# Patient Record
Sex: Female | Born: 1950 | Race: White | Hispanic: No | Marital: Married | Smoking: Former smoker
Health system: Southern US, Community
[De-identification: ages and names within clinical notes are randomized; demographics above are authoritative.]

## PROBLEM LIST (undated history)

## (undated) DIAGNOSIS — I480 Paroxysmal atrial fibrillation: Secondary | ICD-10-CM

## (undated) DIAGNOSIS — J9611 Chronic respiratory failure with hypoxia: Secondary | ICD-10-CM

## (undated) DIAGNOSIS — I509 Heart failure, unspecified: Secondary | ICD-10-CM

## (undated) DIAGNOSIS — I5030 Unspecified diastolic (congestive) heart failure: Secondary | ICD-10-CM

## (undated) DIAGNOSIS — I272 Pulmonary hypertension, unspecified: Secondary | ICD-10-CM

## (undated) DIAGNOSIS — J449 Chronic obstructive pulmonary disease, unspecified: Secondary | ICD-10-CM

## (undated) DIAGNOSIS — I10 Essential (primary) hypertension: Secondary | ICD-10-CM

## (undated) DIAGNOSIS — G4733 Obstructive sleep apnea (adult) (pediatric): Secondary | ICD-10-CM

## (undated) DIAGNOSIS — I2510 Atherosclerotic heart disease of native coronary artery without angina pectoris: Secondary | ICD-10-CM

## (undated) HISTORY — DX: Obstructive sleep apnea (adult) (pediatric): G47.33

## (undated) HISTORY — DX: Atherosclerotic heart disease of native coronary artery without angina pectoris: I25.10

## (undated) HISTORY — DX: Pulmonary hypertension, unspecified: I27.20

## (undated) HISTORY — PX: NO PAST SURGERIES: SHX2092

## (undated) HISTORY — DX: Unspecified diastolic (congestive) heart failure: I50.30

## (undated) HISTORY — DX: Chronic respiratory failure with hypoxia: J96.11

## (undated) HISTORY — DX: Paroxysmal atrial fibrillation: I48.0

---
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
## 2019-11-08 ENCOUNTER — Inpatient Hospital Stay
Admission: EM | Admit: 2019-11-08 | Discharge: 2019-11-10 | DRG: 291 | Disposition: A | Discharge status: Home Health | Payer: Medicare Other | Attending: Internal Medicine | Admitting: Internal Medicine

## 2019-11-08 ENCOUNTER — Emergency Department: Payer: Medicare Other

## 2019-11-08 ENCOUNTER — Encounter: Payer: Self-pay | Admitting: Emergency Medicine

## 2019-11-08 ENCOUNTER — Other Ambulatory Visit: Payer: Self-pay

## 2019-11-08 ENCOUNTER — Inpatient Hospital Stay: Payer: Medicare Other

## 2019-11-08 DIAGNOSIS — I471 Supraventricular tachycardia: Secondary | ICD-10-CM | POA: Diagnosis not present

## 2019-11-08 DIAGNOSIS — Z9981 Dependence on supplemental oxygen: Secondary | ICD-10-CM | POA: Diagnosis not present

## 2019-11-08 DIAGNOSIS — I272 Pulmonary hypertension, unspecified: Secondary | ICD-10-CM | POA: Diagnosis present

## 2019-11-08 DIAGNOSIS — R778 Other specified abnormalities of plasma proteins: Secondary | ICD-10-CM | POA: Diagnosis present

## 2019-11-08 DIAGNOSIS — I110 Hypertensive heart disease with heart failure: Secondary | ICD-10-CM | POA: Diagnosis not present

## 2019-11-08 DIAGNOSIS — E662 Morbid (severe) obesity with alveolar hypoventilation: Secondary | ICD-10-CM | POA: Diagnosis present

## 2019-11-08 DIAGNOSIS — I5031 Acute diastolic (congestive) heart failure: Secondary | ICD-10-CM | POA: Diagnosis not present

## 2019-11-08 DIAGNOSIS — Z20822 Contact with and (suspected) exposure to covid-19: Secondary | ICD-10-CM | POA: Diagnosis present

## 2019-11-08 DIAGNOSIS — J9621 Acute and chronic respiratory failure with hypoxia: Secondary | ICD-10-CM | POA: Diagnosis present

## 2019-11-08 DIAGNOSIS — I5033 Acute on chronic diastolic (congestive) heart failure: Secondary | ICD-10-CM | POA: Diagnosis present

## 2019-11-08 DIAGNOSIS — Z882 Allergy status to sulfonamides status: Secondary | ICD-10-CM

## 2019-11-08 DIAGNOSIS — J9622 Acute and chronic respiratory failure with hypercapnia: Secondary | ICD-10-CM | POA: Diagnosis present

## 2019-11-08 DIAGNOSIS — I5032 Chronic diastolic (congestive) heart failure: Secondary | ICD-10-CM | POA: Diagnosis present

## 2019-11-08 DIAGNOSIS — Z87891 Personal history of nicotine dependence: Secondary | ICD-10-CM | POA: Diagnosis not present

## 2019-11-08 DIAGNOSIS — J441 Chronic obstructive pulmonary disease with (acute) exacerbation: Secondary | ICD-10-CM | POA: Diagnosis present

## 2019-11-08 DIAGNOSIS — E785 Hyperlipidemia, unspecified: Secondary | ICD-10-CM

## 2019-11-08 DIAGNOSIS — R140 Abdominal distension (gaseous): Secondary | ICD-10-CM

## 2019-11-08 DIAGNOSIS — J9601 Acute respiratory failure with hypoxia: Secondary | ICD-10-CM | POA: Diagnosis present

## 2019-11-08 DIAGNOSIS — I248 Other forms of acute ischemic heart disease: Secondary | ICD-10-CM | POA: Diagnosis present

## 2019-11-08 DIAGNOSIS — Z885 Allergy status to narcotic agent status: Secondary | ICD-10-CM

## 2019-11-08 DIAGNOSIS — F419 Anxiety disorder, unspecified: Secondary | ICD-10-CM | POA: Diagnosis present

## 2019-11-08 DIAGNOSIS — Z6841 Body Mass Index (BMI) 40.0 and over, adult: Secondary | ICD-10-CM

## 2019-11-08 DIAGNOSIS — J9602 Acute respiratory failure with hypercapnia: Secondary | ICD-10-CM

## 2019-11-08 DIAGNOSIS — G2581 Restless legs syndrome: Secondary | ICD-10-CM | POA: Diagnosis present

## 2019-11-08 DIAGNOSIS — R008 Other abnormalities of heart beat: Secondary | ICD-10-CM | POA: Diagnosis not present

## 2019-11-08 HISTORY — DX: Essential (primary) hypertension: I10

## 2019-11-08 HISTORY — DX: Chronic obstructive pulmonary disease, unspecified: J44.9

## 2019-11-08 LAB — COMPREHENSIVE METABOLIC PANEL
ALT: 21 U/L (ref 0–44)
AST: 16 U/L (ref 15–41)
Albumin: 3.8 g/dL (ref 3.5–5.0)
Alkaline Phosphatase: 99 U/L (ref 38–126)
Anion gap: 9 (ref 5–15)
BUN: 15 mg/dL (ref 8–23)
CO2: 29 mmol/L (ref 22–32)
Calcium: 9.1 mg/dL (ref 8.9–10.3)
Chloride: 100 mmol/L (ref 98–111)
Creatinine, Ser: 0.72 mg/dL (ref 0.44–1.00)
GFR calc Af Amer: >60 mL/min (ref >60)
GFR calc non Af Amer: >60 mL/min (ref >60)
Glucose, Bld: 103 mg/dL — ABNORMAL HIGH (ref 70–99)
Potassium: 4.4 mmol/L (ref 3.5–5.1)
Sodium: 138 mmol/L (ref 135–145)
Total Bilirubin: 0.9 mg/dL (ref 0.3–1.2)
Total Protein: 7.6 g/dL (ref 6.5–8.1)

## 2019-11-08 LAB — CBC WITH DIFFERENTIAL/PLATELET
Abs Immature Granulocytes: 0.21 10*3/uL — ABNORMAL HIGH (ref 0.00–0.07)
Abs Immature Granulocytes: 0.12 10*3/uL — ABNORMAL HIGH (ref 0.00–0.07)
Basophils Absolute: 0.1 10*3/uL (ref 0.0–0.1)
Basophils Absolute: 0 10*3/uL (ref 0.0–0.1)
Basophils Relative: 1 %
Basophils Relative: 0 %
Eosinophils Absolute: 0.8 10*3/uL — ABNORMAL HIGH (ref 0.0–0.5)
Eosinophils Absolute: 0 10*3/uL (ref 0.0–0.5)
Eosinophils Relative: 4 %
Eosinophils Relative: 0 %
HCT: 41.3 % (ref 36.0–46.0)
HCT: 38.9 % (ref 36.0–46.0)
Hemoglobin: 12.7 g/dL (ref 12.0–15.0)
Hemoglobin: 11.6 g/dL — ABNORMAL LOW (ref 12.0–15.0)
Immature Granulocytes: 1 %
Immature Granulocytes: 1 %
Lymphocytes Relative: 6 %
Lymphocytes Relative: 2 %
Lymphs Abs: 1.2 10*3/uL (ref 0.7–4.0)
Lymphs Abs: 0.3 10*3/uL — ABNORMAL LOW (ref 0.7–4.0)
MCH: 28.3 pg (ref 26.0–34.0)
MCH: 26.9 pg (ref 26.0–34.0)
MCHC: 30.8 g/dL (ref 30.0–36.0)
MCHC: 29.8 g/dL — ABNORMAL LOW (ref 30.0–36.0)
MCV: 92.2 fL (ref 80.0–100.0)
MCV: 90.3 fL (ref 80.0–100.0)
Monocytes Absolute: 0.9 10*3/uL (ref 0.1–1.0)
Monocytes Absolute: 0 10*3/uL — ABNORMAL LOW (ref 0.1–1.0)
Monocytes Relative: 4 %
Monocytes Relative: 0 %
Neutro Abs: 17.3 10*3/uL — ABNORMAL HIGH (ref 1.7–7.7)
Neutro Abs: 18.1 10*3/uL — ABNORMAL HIGH (ref 1.7–7.7)
Neutrophils Relative %: 84 %
Neutrophils Relative %: 97 %
Platelets: 311 10*3/uL (ref 150–400)
Platelets: 309 10*3/uL (ref 150–400)
RBC: 4.48 MIL/uL (ref 3.87–5.11)
RBC: 4.31 MIL/uL (ref 3.87–5.11)
RDW: 16.3 % — ABNORMAL HIGH (ref 11.5–15.5)
RDW: 16.1 % — ABNORMAL HIGH (ref 11.5–15.5)
WBC: 20.5 10*3/uL — ABNORMAL HIGH (ref 4.0–10.5)
WBC: 18.6 10*3/uL — ABNORMAL HIGH (ref 4.0–10.5)
nRBC: 0 % (ref 0.0–0.2)
nRBC: 0 % (ref 0.0–0.2)

## 2019-11-08 LAB — BLOOD GAS, VENOUS
Acid-Base Excess: 5.2 mmol/L — ABNORMAL HIGH (ref 0.0–2.0)
Acid-Base Excess: 4.4 mmol/L — ABNORMAL HIGH (ref 0.0–2.0)
Bicarbonate: 35 mmol/L — ABNORMAL HIGH (ref 20.0–28.0)
Bicarbonate: 33 mmol/L — ABNORMAL HIGH (ref 20.0–28.0)
Delivery systems: POSITIVE
Delivery systems: POSITIVE
FIO2: 35
FIO2: 35
O2 Saturation: 53 %
O2 Saturation: 81.5 %
Patient temperature: 37
Patient temperature: 37
pCO2, Ven: 78 mmHg (ref 44.0–60.0)
pCO2, Ven: 67 mmHg — ABNORMAL HIGH (ref 44.0–60.0)
pH, Ven: 7.26 (ref 7.250–7.430)
pH, Ven: 7.3 (ref 7.250–7.430)
pO2, Ven: 33 mmHg (ref 32.0–45.0)
pO2, Ven: 51 mmHg — ABNORMAL HIGH (ref 32.0–45.0)

## 2019-11-08 LAB — RESPIRATORY PANEL BY RT PCR (FLU A&B, COVID)
Influenza A by PCR: NEGATIVE
Influenza B by PCR: NEGATIVE
SARS Coronavirus 2 by RT PCR: NEGATIVE

## 2019-11-08 LAB — DIGOXIN LEVEL: Digoxin Level: 0.4 ng/mL — ABNORMAL LOW (ref 0.8–2.0)

## 2019-11-08 LAB — TROPONIN I (HIGH SENSITIVITY)
Troponin I (High Sensitivity): 76 ng/L — ABNORMAL HIGH (ref <18)
Troponin I (High Sensitivity): 338 ng/L (ref <18)
Troponin I (High Sensitivity): 555 ng/L (ref <18)

## 2019-11-08 LAB — MAGNESIUM: Magnesium: 2.3 mg/dL (ref 1.7–2.4)

## 2019-11-08 LAB — POC SARS CORONAVIRUS 2 AG: SARS Coronavirus 2 Ag: NEGATIVE

## 2019-11-08 LAB — MRSA PCR SCREENING: MRSA by PCR: NEGATIVE

## 2019-11-08 LAB — GLUCOSE, CAPILLARY: Glucose-Capillary: 176 mg/dL — ABNORMAL HIGH (ref 70–99)

## 2019-11-08 LAB — BRAIN NATRIURETIC PEPTIDE: B Natriuretic Peptide: 85 pg/mL (ref 0.0–100.0)

## 2019-11-08 LAB — PROTIME-INR
INR: 1 (ref 0.8–1.2)
Prothrombin Time: 12.9 seconds (ref 11.4–15.2)

## 2019-11-08 LAB — LACTIC ACID, PLASMA: Lactic Acid, Venous: 0.9 mmol/L (ref 0.5–1.9)

## 2019-11-08 MED ORDER — PREDNISONE 20 MG PO TABS
40.0000 mg | ORAL_TABLET | Freq: Every day | ORAL | Status: DC
  Administered 2019-11-10: 40 mg via ORAL
  Filled 2019-11-08: qty 2

## 2019-11-08 MED ORDER — MOMETASONE FURO-FORMOTEROL FUM 200-5 MCG/ACT IN AERO
1.0000 | INHALATION_SPRAY | Freq: Two times a day (BID) | RESPIRATORY_TRACT | Status: DC
  Administered 2019-11-09 – 2019-11-10 (×3): 1 via RESPIRATORY_TRACT
  Filled 2019-11-08: qty 8.8

## 2019-11-08 MED ORDER — SODIUM CHLORIDE 0.9 % IV SOLN
500.0000 mg | INTRAVENOUS | Status: AC
  Administered 2019-11-08: 500 mg via INTRAVENOUS
  Filled 2019-11-08: qty 500

## 2019-11-08 MED ORDER — SODIUM CHLORIDE 0.9 % IV SOLN: 250.0000 mL | INTRAVENOUS | Status: DC | PRN

## 2019-11-08 MED ORDER — CHLORHEXIDINE GLUCONATE 0.12 % MT SOLN
15.0000 mL | Freq: Two times a day (BID) | OROMUCOSAL | Status: DC
  Administered 2019-11-09 – 2019-11-10 (×2): 15 mL via OROMUCOSAL
  Filled 2019-11-08 (×3): qty 15

## 2019-11-08 MED ORDER — ONDANSETRON HCL 4 MG PO TABS: 4.0000 mg | ORAL_TABLET | Freq: Four times a day (QID) | ORAL | Status: DC | PRN

## 2019-11-08 MED ORDER — BUDESONIDE 0.5 MG/2ML IN SUSP
2.0000 mg | Freq: Four times a day (QID) | RESPIRATORY_TRACT | Status: DC
  Administered 2019-11-08 – 2019-11-09 (×3): 2 mg via RESPIRATORY_TRACT
  Filled 2019-11-08 (×3): qty 8

## 2019-11-08 MED ORDER — BISOPROLOL FUMARATE 5 MG PO TABS
5.0000 mg | ORAL_TABLET | Freq: Every day | ORAL | Status: DC
  Administered 2019-11-09 – 2019-11-10 (×2): 5 mg via ORAL
  Filled 2019-11-08 (×3): qty 1

## 2019-11-08 MED ORDER — BISACODYL 5 MG PO TBEC: 5.0000 mg | DELAYED_RELEASE_TABLET | Freq: Every day | ORAL | Status: DC | PRN

## 2019-11-08 MED ORDER — SPIRONOLACTONE 25 MG PO TABS
25.0000 mg | ORAL_TABLET | Freq: Every day | ORAL | Status: DC
  Administered 2019-11-09 – 2019-11-10 (×2): 25 mg via ORAL
  Filled 2019-11-08 (×2): qty 1

## 2019-11-08 MED ORDER — SODIUM CHLORIDE 0.9% FLUSH: 3.0000 mL | INTRAVENOUS | Status: DC | PRN

## 2019-11-08 MED ORDER — HYDROCODONE-ACETAMINOPHEN 5-325 MG PO TABS: 1.0000 | ORAL_TABLET | ORAL | Status: DC | PRN

## 2019-11-08 MED ORDER — IOHEXOL 350 MG/ML SOLN
75.0000 mL | Freq: Once | INTRAVENOUS | Status: AC | PRN
  Administered 2019-11-08: 100 mL via INTRAVENOUS

## 2019-11-08 MED ORDER — SODIUM CHLORIDE 0.9% FLUSH
3.0000 mL | Freq: Two times a day (BID) | INTRAVENOUS | Status: DC
  Administered 2019-11-08 – 2019-11-10 (×4): 3 mL via INTRAVENOUS

## 2019-11-08 MED ORDER — ROPINIROLE HCL 1 MG PO TABS
2.0000 mg | ORAL_TABLET | Freq: Every day | ORAL | Status: DC
  Administered 2019-11-09: 2 mg via ORAL
  Filled 2019-11-08: qty 2

## 2019-11-08 MED ORDER — POLYETHYLENE GLYCOL 3350 17 G PO PACK
17.0000 g | PACK | Freq: Two times a day (BID) | ORAL | Status: DC
  Filled 2019-11-08 (×2): qty 1

## 2019-11-08 MED ORDER — ORAL CARE MOUTH RINSE
15.0000 mL | Freq: Two times a day (BID) | OROMUCOSAL | Status: DC
  Administered 2019-11-09: 15 mL via OROMUCOSAL

## 2019-11-08 MED ORDER — ALBUTEROL SULFATE (2.5 MG/3ML) 0.083% IN NEBU: 2.5000 mg | INHALATION_SOLUTION | RESPIRATORY_TRACT | Status: DC | PRN

## 2019-11-08 MED ORDER — ACETAMINOPHEN 650 MG RE SUPP: 650.0000 mg | Freq: Four times a day (QID) | RECTAL | Status: DC | PRN

## 2019-11-08 MED ORDER — SODIUM CHLORIDE 0.9% FLUSH
3.0000 mL | Freq: Two times a day (BID) | INTRAVENOUS | Status: DC
  Administered 2019-11-08 – 2019-11-09 (×3): 3 mL via INTRAVENOUS

## 2019-11-08 MED ORDER — FUROSEMIDE 10 MG/ML IJ SOLN
40.0000 mg | Freq: Two times a day (BID) | INTRAMUSCULAR | Status: DC
  Administered 2019-11-09 – 2019-11-10 (×3): 40 mg via INTRAVENOUS
  Filled 2019-11-08 (×3): qty 4

## 2019-11-08 MED ORDER — ONDANSETRON HCL 4 MG/2ML IJ SOLN: 4.0000 mg | Freq: Four times a day (QID) | INTRAMUSCULAR | Status: DC | PRN

## 2019-11-08 MED ORDER — BUDESONIDE 0.5 MG/2ML IN SUSP: 2.0000 mg | Freq: Four times a day (QID) | RESPIRATORY_TRACT | Status: DC

## 2019-11-08 MED ORDER — AZITHROMYCIN 500 MG PO TABS
500.0000 mg | ORAL_TABLET | Freq: Every day | ORAL | Status: DC
  Administered 2019-11-09 – 2019-11-10 (×2): 500 mg via ORAL
  Filled 2019-11-08 (×3): qty 1

## 2019-11-08 MED ORDER — ACETAMINOPHEN 325 MG PO TABS: 650.0000 mg | ORAL_TABLET | Freq: Four times a day (QID) | ORAL | Status: DC | PRN

## 2019-11-08 MED ORDER — ESCITALOPRAM OXALATE 10 MG PO TABS
20.0000 mg | ORAL_TABLET | Freq: Every day | ORAL | Status: DC
  Administered 2019-11-09 – 2019-11-10 (×2): 20 mg via ORAL
  Filled 2019-11-08 (×3): qty 2

## 2019-11-08 MED ORDER — DIGOXIN 125 MCG PO TABS
0.1250 mg | ORAL_TABLET | Freq: Every day | ORAL | Status: DC
  Administered 2019-11-09: 0.125 mg via ORAL
  Filled 2019-11-08 (×2): qty 1

## 2019-11-08 MED ORDER — METHYLPREDNISOLONE SODIUM SUCC 40 MG IJ SOLR
40.0000 mg | Freq: Two times a day (BID) | INTRAMUSCULAR | Status: AC
  Administered 2019-11-09 (×2): 40 mg via INTRAVENOUS
  Filled 2019-11-08 (×2): qty 1

## 2019-11-08 MED ORDER — ASPIRIN 81 MG PO CHEW
324.0000 mg | CHEWABLE_TABLET | Freq: Once | ORAL | Status: AC
  Administered 2019-11-08: 324 mg via ORAL
  Filled 2019-11-08: qty 4

## 2019-11-08 MED ORDER — IPRATROPIUM-ALBUTEROL 0.5-2.5 (3) MG/3ML IN SOLN
3.0000 mL | Freq: Four times a day (QID) | RESPIRATORY_TRACT | Status: DC
  Administered 2019-11-08 – 2019-11-10 (×8): 3 mL via RESPIRATORY_TRACT
  Filled 2019-11-08 (×8): qty 3

## 2019-11-08 MED ORDER — ENOXAPARIN SODIUM 40 MG/0.4ML ~~LOC~~ SOLN
40.0000 mg | Freq: Two times a day (BID) | SUBCUTANEOUS | Status: DC
  Administered 2019-11-09 – 2019-11-10 (×3): 40 mg via SUBCUTANEOUS
  Filled 2019-11-08 (×4): qty 0.4

## 2019-11-08 MED ORDER — CHLORHEXIDINE GLUCONATE CLOTH 2 % EX PADS
6.0000 | MEDICATED_PAD | Freq: Every day | CUTANEOUS | Status: DC
  Administered 2019-11-09 – 2019-11-10 (×2): 6 via TOPICAL

## 2019-11-08 NOTE — ED Provider Notes (Signed)
[facility] Emergency Department Provider Note  ____________________________________________   None    (approximate)  I have reviewed the triage vital signs and the nursing notes.   HISTORY  Chief Complaint Respiratory Distress   HPI Paula Torres is a 69 y.o. female with a history of COPD and CHF who presents to the emergency department for treatment and evaluation of shortness of breath.   Patient reports that she has been feeling more short of breath since this morning.  She is on 3 L of oxygen at night and as needed during the day.  Upon EMS arrival, her oxygen saturation was 87% and she was unable to speak in complete sentences.  End-tidal CO2 was 60 initially.  She has had no chest pain since onset.  EMS gave Solu-Medrol 125 mg, 5 mg of albuterol, 1 mg of Atrovent and placed her on BiPAP.  She has improved and now saturating 97 to 98%.  Past Medical History:  Diagnosis Date  . CHF (congestive heart failure) ([place])   . COPD (chronic obstructive pulmonary disease) ([facility])     There are no problems to display for this patient.   Prior to Admission medications   Not on File    Allergies Codeine and Sulfa antibiotics  No family history on file.  Social History Social History   Tobacco Use  . Smoking status: Not on file  Substance Use Topics  . Alcohol use: Not on file  . Drug use: Not on file    Review of Systems  Constitutional: No fever/chills. Eyes: No visual changes. ENT: No sore throat. Cardiovascular: Negative for chest pain.  Negative for pleuritic pain.  Negative for palpitations.  Negative for leg pain. Respiratory: Positive shortness of breath. Gastrointestinal: No abdominal pain.  No nausea, no vomiting.  No diarrhea.  No constipation. Genitourinary: Negative for dysuria. Musculoskeletal: Negative for back pain.  Skin: Negative for rash, lesion, wound. Neurological: Negative for headaches, focal weakness or  numbness. ____________________________________________   PHYSICAL EXAM:  VITAL SIGNS: ED Triage Vitals  Enc Vitals Group     BP 11/08/19 1630 137/66     Pulse Rate 11/08/19 1630 95     Resp 11/08/19 1623 (!) 34     Temp 11/08/19 1641 (!) 97.5 F (36.4 C)     Temp Source 11/08/19 1641 Axillary     SpO2 11/08/19 1630 97 %     Weight 11/08/19 1625 257 lb (116.6 kg)     Height 11/08/19 1625 5' (1.524 m)     Head Circumference --      Peak Flow --      Pain Score 11/08/19 1624 0     Pain Loc --      Pain Edu? --      Excl. in [place]? --     Constitutional: Alert and oriented.  Ill appearing and in acute distress.  Normal mental status. Eyes: Conjunctivae are normal. Head: Atraumatic. Nose: No congestion/rhinnorhea. Mouth/Throat: Mucous membranes are moist.  Oropharynx non-erythematous. Tongue normal in size and color. Neck: No stridor.  No carotid bruit appreciated on exam. Hematological/Lymphatic/Immunilogical: No cervical lymphadenopathy. Cardiovascular: Normal rate, regular rhythm. Grossly normal heart sounds.  Good peripheral circulation. Respiratory: Increased respiratory effort. Lungs diminished throughout. Gastrointestinal: Soft and nontender. No distention. No abdominal bruits. No CVA tenderness. Genitourinary: Exam deferred. Musculoskeletal: No lower extremity tenderness.  Nonpitting edema of extremities. Neurologic:  Normal speech and language. No gross focal neurologic deficits are appreciated. Skin:  Skin is warm,  dry and intact. No rash noted. Psychiatric: Mood and affect are normal. Speech and behavior are normal.  ____________________________________________   LABS (all labs ordered are listed, but only abnormal results are displayed)  Labs Reviewed  CBC WITH DIFFERENTIAL/PLATELET - Abnormal; Notable for the following components:      Result Value   WBC 20.5 (*)    RDW 16.3 (*)    Neutro Abs 17.3 (*)    Eosinophils Absolute 0.8 (*)    Abs Immature  Granulocytes 0.21 (*)    All other components within normal limits  COMPREHENSIVE METABOLIC PANEL - Abnormal; Notable for the following components:   Glucose, Bld 103 (*)    All other components within normal limits  BLOOD GAS, VENOUS - Abnormal; Notable for the following components:   pCO2, Ven 78 (*)    Bicarbonate 35.0 (*)    Acid-Base Excess 5.2 (*)    All other components within normal limits  BLOOD GAS, VENOUS - Abnormal; Notable for the following components:   pCO2, Ven 67 (*)    pO2, Ven 51.0 (*)    Bicarbonate 33.0 (*)    Acid-Base Excess 4.4 (*)    All other components within normal limits  TROPONIN I (HIGH SENSITIVITY) - Abnormal; Notable for the following components:   Troponin I (High Sensitivity) 76 (*)    All other components within normal limits  TROPONIN I (HIGH SENSITIVITY) - Abnormal; Notable for the following components:   Troponin I (High Sensitivity) 338 (*)    All other components within normal limits  RESPIRATORY PANEL BY RT PCR (FLU A&B, COVID)  BRAIN NATRIURETIC PEPTIDE  LACTIC ACID, PLASMA  PROTIME-INR  POC SARS CORONAVIRUS 2 AG -  ED  POC SARS CORONAVIRUS 2 AG   ____________________________________________  EKG  ED ECG REPORT I, Prajwal Fellner, FNP-BC personally viewed and interpreted this ECG.   Date: 11/08/2019  EKG Time: 1625  Rate: 97  Rhythm: normal sinus rhythm  Axis: normal  Intervals: PACs  ST&T Change: no ST elevation  ____________________________________________  RADIOLOGY  ED MD interpretation:  No acute findings.   I, Sherrie George, personally viewed and evaluated these images (plain radiographs) as part of my medical decision making, as well as reviewing the written report by the radiologist.  Official radiology report(s): DG Chest Portable 1 View  Result Date: 11/08/2019 CLINICAL DATA:  Shortness of breath since this morning. Hypoxia. EXAM: PORTABLE CHEST 1 VIEW COMPARISON:  None. FINDINGS: Normal sized heart. Prominent  pulmonary vasculature and interstitial markings. The lungs are hyperexpanded. No visible pleural fluid. Thoracic spine degenerative changes. IMPRESSION: Mild changes of congestive heart failure superimposed on COPD. Electronically Signed   By: Claudie Revering M.D.   On: 11/08/2019 16:52    ____________________________________________   PROCEDURES  Procedure(s) performed: None  Procedures  Critical Care performed: Yes, see critical care note(s)  ____________________________________________   INITIAL IMPRESSION / ASSESSMENT AND PLAN   69 year old presenting to the emergency department via EMS secondary to acute respiratory distress.  She is doing better on BiPAP and after steroids and nebulized treatments.  Plan will be to place her on BiPAP, get labs, EKG, and monitor her closely.  Patient is able to state that she feels much better with BiPAP in place.  She denies chest pain.  Differential diagnosis includes but is not limited to: COPD exacerbation, acute respiratory failure, COVID-19, pneumonia, sepsis, cardiac event, pulmonary embolus  ED COURSE  Patient feeling much better with BiPAP in place and her oxygen saturation  is staying above 93%.  She still sits in a tripod position but this appears to be her position of comfort and not related to respiratory distress.  She is alert and oriented and answers questions in full sentences.  ----------------------------------------- 4:31 PM on 11/08/2019 -----------------------------------------  Initial troponin is elevated to 76.  This is most likely related to demand.  She denies chest pain and there have been no EKG changes.  Oxygen saturation remained stable.  She is not currently tachycardic.  Blood pressures also stable.  Initial PCO2 is 78.  Will repeat in about an hour or so to make sure it is improving.  Plan will be to await the remainder of her labs and admit her.  ----------------------------------------- 5:45 PM on  11/08/2019 -----------------------------------------  PCO2 has improved some.  It is now 87.  No change in patient assessment.  Oxygen saturation remains above 90% and she continues to state that she feels better.  ----------------------------------------- 7:40 PM on 11/08/2019 -----------------------------------------  Rapid COVID-19 testing is negative. Plan will be to get her admitted.   ----------------------------------------- 8:32 PM on 11/08/2019 -----------------------------------------  Patient admitted through the hospitalist service. Hospitalist has requested that she have a CTA which has been ordered.  CRITICAL CARE Performed by: Sherrie George   Total critical care time: 30 minutes  Critical care time was exclusive of separately billable procedures and treating other patients.  Critical care was necessary to treat or prevent imminent or life-threatening deterioration.  Critical care was time spent personally by me on the following activities: development of treatment plan with patient and/or surrogate as well as nursing, discussions with consultants, evaluation of patient's response to treatment, examination of patient, obtaining history from patient or surrogate, ordering and performing treatments and interventions, ordering and review of laboratory studies, ordering and review of radiographic studies, pulse oximetry and re-evaluation of patient's condition.   As part of my medical decision making, I reviewed the following data within the [facility] notes reviewed and incorporated, Old chart reviewed, Discussed with admitting physician  and Notes from prior ED visits    ED COURSE ___________________________________________  Differential diagnosis includes, but not limited to:     FINAL CLINICAL IMPRESSION(S) / ED DIAGNOSES  Final diagnoses:  Acute respiratory failure with hypoxia and hypercapnia [facility])     ED Discharge Orders     None       Cressie Betzler was evaluated in Emergency Department on 11/08/2019 for the symptoms described in the history of present illness. She was evaluated in the context of the global COVID-19 pandemic, which necessitated consideration that the patient might be at risk for infection with the SARS-CoV-2 virus that causes COVID-19. Institutional protocols and algorithms that pertain to the evaluation of patients at risk for COVID-19 are in a state of rapid change based on information released by regulatory bodies including the CDC and federal and state organizations. These policies and algorithms were followed during the patient's care in the ED.   Note:  This document was prepared using Dragon voice recognition software and may include unintentional dictation errors.   Victorino Dike, FNP 11/08/19 2033    Nance Pear, MD 11/08/19 2039

## 2019-11-08 NOTE — ED Triage Notes (Addendum)
Pt via EMS from home. Pt states that she has been feeling bad since this AM. Pt initial O2 sat on RA was 87% and pt was unable to speak in complete sentences. Pt end tidal CO2 was 60 initially. Pt denies CP. EMS gave 5 abuterol, 1 atrovent, 125 solumedrol. Pt is on CPAP on arrival. Pt now switch to BiPap and O2 sat 98%. Pt A&Ox4 at this time.

## 2019-11-08 NOTE — ED Notes (Signed)
Date and time results received: 11/08/19 1639  Test: pCO2  Critical Value: 84  Name of Provider Notified: Vladimir Crofts, NP

## 2019-11-08 NOTE — ED Notes (Signed)
[facility] [place] [phone]

## 2019-11-08 NOTE — H&P (Signed)
Nyhla Mountjoy CCE:[id number] DOB: [birth year] DOA: 11/08/2019     PCP: [facility]   Outpatient Specialists:  CARDS:  Dr.Miller Pulmonary   Dr.Aleskerov   Patient arrived to ER on 11/08/19 at 1620  Patient coming from: home Lives  With family    Chief Complaint:   Chief Complaint  Patient presents with  . Respiratory Distress    HPI: Paula Torres is a 69 y.o. female with medical history significant of COPD,chronic diastolic CHF, OSA    Presented with feeling progressively short of breath worse since this morning.  EMS was called oxygen saturation on room air 87% unable to speak in complete sentences end-tidal CO2 was checked and was 60 initially EMS administered albuterol Atrovent and 125 mg of Solu-Medrol patient was placed on CPAP on arrival and switched to BiPAP once in the emergency department. She has underlying COPD that has been progressive and been followed by pulmonology  Pt reports no fever no chest pain, she has not taken her medications yesterday bc she was feeling more sleepy  Infectious risk factors:  Reports shortness of breath, dry cough,     In  ER RAPID COVID TEST  NEGATIVE   in house  PCR testing  Pending NEGATIVE  Lab Results  Component Value Date   [place] NEGATIVE 11/08/2019     Regarding pertinent Chronic problems:      HTN on Toprolol   chronic CHF diastolic  -   On digoxin, spironolactone, bumex       Morbid obesity-   BMI Readings from Last 1 Encounters:  11/08/19 50.19 kg/m        COPD -  followed by pulmonology  On Respimat, Spiriva, Symbicort  COPD Gold stage 2    OSA -on nocturnal oxygen,    While in ER: VBG on BiPAP (PCO2 of 67 Troponin elevated 76 initially and it up to 338 and 2 hours Leukocytosis up to 20.5  Chest x-ray showed mild changes of congestive heart failure superimposed on COPD   The following Work up has been ordered so far:  Orders Placed This Encounter  Procedures  .  Respiratory Panel by RT PCR (Flu A&B, Covid) - Nasopharyngeal Swab  . DG Chest Portable 1 View  . CBC with Differential  . Brain natriuretic peptide  . Comprehensive metabolic panel  . Lactic acid, plasma  . Protime-INR  . Blood gas, venous  . Blood gas, venous  . Cardiac monitoring  . Consult to hospitalist  ALL PATIENTS BEING ADMITTED/HAVING PROCEDURES NEED COVID-19 SCREENING  . Airborne and Contact precautions  . Pulse oximetry (single)  . Bipap  . POC SARS Coronavirus 2 Ag-ED - Nasal Swab (BD Veritor Kit)  . EKG 12-Lead  . ED EKG  . Insert peripheral IV    Following Medications were ordered in ER: Medications  aspirin chewable tablet 324 mg (324 mg Oral Given 11/08/19 2003)        Consult Orders  (From admission, onward)         Start     Ordered   11/08/19 2004  Consult to hospitalist  ALL PATIENTS BEING ADMITTED/HAVING PROCEDURES NEED COVID-19 SCREENING  Once    Comments: ALL PATIENTS BEING ADMITTED/HAVING PROCEDURES NEED COVID-19 SCREENING  Provider:  (Not yet assigned)  Question Answer Comment  Place call to: acute respiratory failure on Bipap   Reason for Consult Admit      11/08/19 2004          Significant  initial  Findings: Abnormal Labs Reviewed  CBC WITH DIFFERENTIAL/PLATELET - Abnormal; Notable for the following components:      Result Value   WBC 20.5 (*)    RDW 16.3 (*)    Neutro Abs 17.3 (*)    Eosinophils Absolute 0.8 (*)    Abs Immature Granulocytes 0.21 (*)    All other components within normal limits  COMPREHENSIVE METABOLIC PANEL - Abnormal; Notable for the following components:   Glucose, Bld 103 (*)    All other components within normal limits  BLOOD GAS, VENOUS - Abnormal; Notable for the following components:   pCO2, Ven 78 (*)    Bicarbonate 35.0 (*)    Acid-Base Excess 5.2 (*)    All other components within normal limits  BLOOD GAS, VENOUS - Abnormal; Notable for the following components:   pCO2, Ven 67 (*)    pO2, Ven 51.0  (*)    Bicarbonate 33.0 (*)    Acid-Base Excess 4.4 (*)    All other components within normal limits  TROPONIN I (HIGH SENSITIVITY) - Abnormal; Notable for the following components:   Troponin I (High Sensitivity) 76 (*)    All other components within normal limits  TROPONIN I (HIGH SENSITIVITY) - Abnormal; Notable for the following components:   Troponin I (High Sensitivity) 338 (*)    All other components within normal limits    Otherwise labs showing:    Recent Labs  Lab 11/08/19 1631  NA 138  K 4.4  CO2 29  GLUCOSE 103*  BUN 15  CREATININE 0.72  CALCIUM 9.1    Cr   Stable  Lab Results  Component Value Date   CREATININE 0.72 11/08/2019    Recent Labs  Lab 11/08/19 1631  AST 16  ALT 21  ALKPHOS 99  BILITOT 0.9  PROT 7.6  ALBUMIN 3.8   Lab Results  Component Value Date   CALCIUM 9.1 11/08/2019     WBC      Component Value Date/Time   WBC 20.5 (H) 11/08/2019 1631   ANC    Component Value Date/Time   NEUTROABS 17.3 (H) 11/08/2019 1631   ALC No components found for: LYMPHAB    Plt: Lab Results  Component Value Date   PLT 311 11/08/2019     Lactic Acid, Venous    Component Value Date/Time   LATICACIDVEN 0.9 11/08/2019 1828      COVID-19 Labs    Lab Results  Component Value Date   SARSCOV2NAA NEGATIVE 11/08/2019      Venous  Blood Gas result:  pH 7.3 pCO2  67   ABG    Component Value Date/Time   HCO3 33.0 (H) 11/08/2019 1742   O2SAT 81.5 11/08/2019 1742    HG/HCT stable,      Component Value Date/Time   HGB 12.7 11/08/2019 1631   HCT 41.3 11/08/2019 1631    No results for input(s): LIPASE, AMYLASE in the last 168 hours. No results for input(s): AMMONIA in the last 168 hours.  No components found for: LABALBU   Troponin 76 - 338 Cardiac Panel (last 3 results) No results for input(s): CKTOTAL, CKMB, TROPONINI, RELINDX in the last 72 hours.     ECG: Ordered Personally reviewed by me showing: HR : 97 Rhythm NSR   no  evidence of ischemic changes QTC 456   BNP (last 3 results) Recent Labs    11/08/19 1631  BNP 85.0     UA  not ordered  CXR - mild CHF superimposed on COPD    CTA chest -  Ordered      ED Triage Vitals  Enc Vitals Group     BP 11/08/19 1630 137/66     Pulse Rate 11/08/19 1630 95     Resp 11/08/19 1623 (!) 34     Temp 11/08/19 1641 (!) 97.5 F (36.4 C)     Temp Source 11/08/19 1641 Axillary     SpO2 11/08/19 1630 97 %     Weight 11/08/19 1625 257 lb (116.6 kg)     Height 11/08/19 1625 5' (1.524 m)     Head Circumference --      Peak Flow --      Pain Score 11/08/19 1624 0     Pain Loc --      Pain Edu? --      Excl. in [place]? --   TMAX(24)@       Latest  Blood pressure (!) 144/69, pulse 94, temperature (!) 97.5 F (36.4 C), temperature source Axillary, resp. rate 20, height 5' (1.524 m), weight 116.6 kg, SpO2 93 %.    Hospitalist was called for admission for acute respiratory failure in the setting of acute on chronic diastolic CHF and COPD exacerbation with known obesity hypoventilation syndrome    Review of Systems:    Pertinent positives include:  shortness of breath at rest. dyspnea on exertion Bilateral lower extremity swelling  Constitutional:  No weight loss, night sweats, Fevers, chills, fatigue, weight loss  HEENT:  No headaches, Difficulty swallowing,Tooth/dental problems,Sore throat,  No sneezing, itching, ear ache, nasal congestion, post nasal drip,  Cardio-vascular:  No chest pain, Orthopnea, PND, anasarca, dizziness, palpitations.no  GI:  No heartburn, indigestion, abdominal pain, nausea, vomiting, diarrhea, change in bowel habits, loss of appetite, melena, blood in stool, hematemesis Resp:   No excess mucus, no productive cough, No non-productive cough, No coughing up of blood.No change in color of mucus.No wheezing. Skin:  no rash or lesions. No jaundice GU:  no dysuria, change in color of urine, no urgency or frequency. No straining to  urinate.  No flank pain.  Musculoskeletal:  No joint pain or no joint swelling. No decreased range of motion. No back pain.  Psych:  No change in mood or affect. No depression or anxiety. No memory loss.  Neuro: no localizing neurological complaints, no tingling, no weakness, no double vision, no gait abnormality, no slurred speech, no confusion  All systems reviewed and apart from [place] all are negative  Past Medical History:   Past Medical History:  Diagnosis Date  . CHF (congestive heart failure) ([place])   . COPD (chronic obstructive pulmonary disease) ([place])      Past Surgical History:  Procedure Laterality Date  . NO PAST SURGERIES      Social History:  Ambulatory independently      reports that she has quit smoking. She has never used smokeless tobacco. She reports previous alcohol use. She reports that she does not use drugs.  Family History:   Family History  Problem Relation Age of Onset  . Hypertension Other     Allergies: Allergies  Allergen Reactions  . Codeine     Headache  . Sulfa Antibiotics      Prior to Admission medications   Not on File   Physical Exam: Blood pressure (!) 144/69, pulse 94, temperature (!) 97.5 F (36.4 C), temperature source Axillary, resp. rate 20, height 5' (1.524 m), weight 116.6 kg, SpO2 93 %.  1. General:  in No  Acute distress   Chronically ill  -appearing 2. Psychological: Alert and  Oriented to self arrousable 3. Head/ENT:  Dry Mucous Membranes                          Head Non traumatic, neck supple                           Poor Dentition 4. SKIN: normal  Skin turgor,  Skin clean Dry and intact multiple excoriations 5. Heart: Regular rate and rhythm no Murmur, no Rub or gallop 6. Lungs:   no wheezes or crackles  On BIPAP 7. Abdomen: Soft, non-tender,  distended    obese   8. Lower extremities: no clubbing, cyanosis, trace edema, venous stasis changes 9. Neurologically Grossly intact, moving all 4 extremities  equally   10. MSK: Normal range of motion   All other LABS:     Recent Labs  Lab 11/08/19 1631  WBC 20.5*  NEUTROABS 17.3*  HGB 12.7  HCT 41.3  MCV 92.2  PLT 311     Recent Labs  Lab 11/08/19 1631  NA 138  K 4.4  CL 100  CO2 29  GLUCOSE 103*  BUN 15  CREATININE 0.72  CALCIUM 9.1     Recent Labs  Lab 11/08/19 1631  AST 16  ALT 21  ALKPHOS 99  BILITOT 0.9  PROT 7.6  ALBUMIN 3.8       Cultures: No results found for: SDES, SPECREQUEST, CULT, REPTSTATUS   Radiological Exams on Admission: DG Chest Portable 1 View  Result Date: 11/08/2019 CLINICAL DATA:  Shortness of breath since this morning. Hypoxia. EXAM: PORTABLE CHEST 1 VIEW COMPARISON:  None. FINDINGS: Normal sized heart. Prominent pulmonary vasculature and interstitial markings. The lungs are hyperexpanded. No visible pleural fluid. Thoracic spine degenerative changes. IMPRESSION: Mild changes of congestive heart failure superimposed on COPD. Electronically Signed   By: Claudie Revering M.D.   On: 11/08/2019 16:52    Chart has been reviewed    Assessment/Plan  69 y.o. female with medical history significant of COPD,chronic diastolic CHF, OSA     Admitted for acute respiratory failure in the setting of acute on chronic diastolic CHF and COPD exacerbation with known obesity hypoventilation syndrome  Present on Admission: . COPD with acute exacerbation ([place]) -  Will initiate: Steroid taper  -  Antibiotics azithro - Albuterol PRN, - scheduled duoneb,  -  Breo or Dulera at discharge   -  Mucinex.  Titrate O2 to saturation >90%. Follow patients respiratory status.   VBG - showing hypercarbia, likely chronic component  BiPAP ordered PRN for increased work of breathing.  Currently mentating well no evidence of symptomatic hypercarbia Order flutter valve - COVID negative   . Acute on chronic diastolic CHF (congestive heart failure) ([place]) -  - admit on telemetry,  cycle cardiac enzymes, Troponin 76 -  338  obtain serial ECG  to evaluate for ischemia as a cause of heart failure  monitor daily weight:  Filed Weights   11/08/19 1625  Weight: 116.6 kg   Last BNP BNP (last 3 results) Recent Labs    11/08/19 1631  BNP 85.0     diurese with IV lasix and monitor orthostatics and creatinine to avoid over diuresis.  Order echogram to evaluate EF and valves -on spironolactone    -noncompliance  Contributed  . Acute respiratory failure  with hypoxia and hypercapnia ([place]) - likley multifactorial COVID negative from 3 days ago and  COVID neg today  . Elevated troponin -   no chest pain no EKG changes in the setting of  increased work of breathing  likely due to demand ischemia and poor clearance, monitor on telemetry and cycle cardiac enzymes to trend.  if continues to rise will need further work-up  obtain CTA to eva for PE  Morbid obesity -nutritional consult as an outpatient   Abd distention - obtain KUB  Multiple areas of excoriation - wound care consult May benefit from out pt referral to dermatology. Anxiety likely is contributing will cont Lexapro  Restless leg syndrome on requip  Other plan as per orders.  DVT prophylaxis:   Lovenox     Code Status:  FULL CODE  as per patient  I had personally discussed CODE STATUS with patient    Family Communication:   Family not at  Bedside   Disposition Plan:  To home once workup is complete and patient is stable                  Would benefit from PT/OT eval prior to DC  Ordered                                        Consults called: please consult Pulmonology in AM  Admission status:  ED Disposition    ED Disposition Condition [place]: [place] [[id number]]  Level of Care: Stepdown [14]  Covid Evaluation: Confirmed COVID Negative  Diagnosis: COPD with acute exacerbation [facility]) [[id number]]  Admitting Physician: Toy Baker [[id number]]  Attending Physician: Toy Baker  [[id number]]  Estimated length of stay: 3 - 4 days  Certification:: I certify this patient will need inpatient services for at least 2 midnights         inpatient     I Expect 2 midnight stay secondary to severity of patient's current illness need for inpatient interventions justified by the following:  hemodynamic instability despite optimal treatment  hypoxia, hypercapnia)   Severe lab/radiological/exam abnormalities including:  Rising troponin, hypercarbia requiring BiPAP   and extensive comorbidities including:  CHF  COPD/asthma  Morbid Obesity   That are currently affecting medical management.   I expect  patient to be hospitalized for 2 midnights requiring inpatient medical care.  Patient is at high risk for adverse outcome (such as loss of life or disability) if not treated.  Indication for inpatient stay as follows:    Hemodynamic instability despite maximal medical therapy,  New or worsening hypoxia  need for BIPAP   Level of care       SDU tele indefinitely please discontinue once patient no longer qualifies   Precautions: admitted as COVID neg   PPE: Used by the provider:   P100  eye Goggles,  Gloves    Paula Torres 11/08/2019, 9:35 PM    [facility]     after 2 AM please page floor coverage PA If 7AM-7PM, please contact the day team taking care of the patient using [email or web address]   Patient was evaluated in the context of the global COVID-19 pandemic, which necessitated consideration that the patient might be at risk for infection with the SARS-CoV-2 virus that causes COVID-19. Institutional protocols and algorithms that pertain to the evaluation of patients at risk for COVID-19 are  in a state of rapid change based on information released by regulatory bodies including the CDC and federal and state organizations. These policies and algorithms were followed during the patient's care.

## 2019-11-08 NOTE — Progress Notes (Signed)
PHARMACIST - PHYSICIAN COMMUNICATION  CONCERNING:  Enoxaparin (Lovenox) for DVT Prophylaxis    RECOMMENDATION: Patient was prescribed enoxaprin 1m q24 hours for VTE prophylaxis.   Filed Weights   11/08/19 1625  Weight: 257 lb (116.6 kg)    Body mass index is 50.19 kg/m.  Estimated Creatinine Clearance: 78.5 mL/min (by C-G formula based on SCr of 0.72 mg/dL).   Based on ARoyalpatient is candidate for enoxaparin 424mevery 12 hour dosing due to BMI being >40.  DESCRIPTION: [facility]  Patient is now receiving enoxaparin 4029mvery 12 hours.   HalEna DawleyharmD Clinical Pharmacist  11/08/2019 10:15 PM

## 2019-11-09 ENCOUNTER — Inpatient Hospital Stay (HOSPITAL_COMMUNITY)
Admit: 2019-11-09 | Discharge: 2019-11-09 | Disposition: A | Discharge status: Home / Self Care | Payer: Medicare Other | Attending: Internal Medicine | Admitting: Internal Medicine

## 2019-11-09 DIAGNOSIS — I5033 Acute on chronic diastolic (congestive) heart failure: Secondary | ICD-10-CM

## 2019-11-09 DIAGNOSIS — J441 Chronic obstructive pulmonary disease with (acute) exacerbation: Secondary | ICD-10-CM

## 2019-11-09 DIAGNOSIS — R778 Other specified abnormalities of plasma proteins: Secondary | ICD-10-CM

## 2019-11-09 DIAGNOSIS — Z6841 Body Mass Index (BMI) 40.0 and over, adult: Secondary | ICD-10-CM

## 2019-11-09 DIAGNOSIS — J9602 Acute respiratory failure with hypercapnia: Secondary | ICD-10-CM

## 2019-11-09 DIAGNOSIS — J9601 Acute respiratory failure with hypoxia: Secondary | ICD-10-CM

## 2019-11-09 DIAGNOSIS — I5031 Acute diastolic (congestive) heart failure: Secondary | ICD-10-CM

## 2019-11-09 DIAGNOSIS — J9621 Acute and chronic respiratory failure with hypoxia: Secondary | ICD-10-CM

## 2019-11-09 DIAGNOSIS — R008 Other abnormalities of heart beat: Secondary | ICD-10-CM

## 2019-11-09 LAB — COMPREHENSIVE METABOLIC PANEL
ALT: 18 U/L (ref 0–44)
AST: 16 U/L (ref 15–41)
Albumin: 3.4 g/dL — ABNORMAL LOW (ref 3.5–5.0)
Alkaline Phosphatase: 87 U/L (ref 38–126)
Anion gap: 7 (ref 5–15)
BUN: 18 mg/dL (ref 8–23)
CO2: 29 mmol/L (ref 22–32)
Calcium: 9 mg/dL (ref 8.9–10.3)
Chloride: 101 mmol/L (ref 98–111)
Creatinine, Ser: 0.82 mg/dL (ref 0.44–1.00)
GFR calc Af Amer: >60 mL/min (ref >60)
GFR calc non Af Amer: >60 mL/min (ref >60)
Glucose, Bld: 156 mg/dL — ABNORMAL HIGH (ref 70–99)
Potassium: 4.8 mmol/L (ref 3.5–5.1)
Sodium: 137 mmol/L (ref 135–145)
Total Bilirubin: 0.8 mg/dL (ref 0.3–1.2)
Total Protein: 7.1 g/dL (ref 6.5–8.1)

## 2019-11-09 LAB — RESPIRATORY PANEL BY PCR

## 2019-11-09 LAB — URINALYSIS, ROUTINE W REFLEX MICROSCOPIC
Bilirubin Urine: NEGATIVE
Glucose, UA: NEGATIVE mg/dL
Hgb urine dipstick: NEGATIVE
Ketones, ur: NEGATIVE mg/dL
Leukocytes,Ua: NEGATIVE
Nitrite: NEGATIVE
Protein, ur: NEGATIVE mg/dL
Specific Gravity, Urine: 1.032 — ABNORMAL HIGH (ref 1.005–1.030)
pH: 5 (ref 5.0–8.0)

## 2019-11-09 LAB — LIPID PANEL
Cholesterol: 218 mg/dL — ABNORMAL HIGH (ref 0–200)
HDL: 49 mg/dL (ref >40)
LDL Cholesterol: 152 mg/dL — ABNORMAL HIGH (ref 0–99)
Total CHOL/HDL Ratio: 4.4 RATIO
Triglycerides: 85 mg/dL (ref <150)
VLDL: 17 mg/dL (ref 0–40)

## 2019-11-09 LAB — CBC WITH DIFFERENTIAL/PLATELET
Abs Immature Granulocytes: 0.09 10*3/uL — ABNORMAL HIGH (ref 0.00–0.07)
Basophils Absolute: 0 10*3/uL (ref 0.0–0.1)
Basophils Relative: 0 %
Eosinophils Absolute: 0 10*3/uL (ref 0.0–0.5)
Eosinophils Relative: 0 %
HCT: 38.8 % (ref 36.0–46.0)
Hemoglobin: 11.9 g/dL — ABNORMAL LOW (ref 12.0–15.0)
Immature Granulocytes: 1 %
Lymphocytes Relative: 4 %
Lymphs Abs: 0.5 10*3/uL — ABNORMAL LOW (ref 0.7–4.0)
MCH: 27.7 pg (ref 26.0–34.0)
MCHC: 30.7 g/dL (ref 30.0–36.0)
MCV: 90.4 fL (ref 80.0–100.0)
Monocytes Absolute: 0.1 10*3/uL (ref 0.1–1.0)
Monocytes Relative: 1 %
Neutro Abs: 13.5 10*3/uL — ABNORMAL HIGH (ref 1.7–7.7)
Neutrophils Relative %: 94 %
Platelets: 297 10*3/uL (ref 150–400)
RBC: 4.29 MIL/uL (ref 3.87–5.11)
RDW: 16.2 % — ABNORMAL HIGH (ref 11.5–15.5)
WBC: 14.2 10*3/uL — ABNORMAL HIGH (ref 4.0–10.5)
nRBC: 0 % (ref 0.0–0.2)

## 2019-11-09 LAB — TROPONIN I (HIGH SENSITIVITY): Troponin I (High Sensitivity): 407 ng/L (ref <18)

## 2019-11-09 LAB — ECHOCARDIOGRAM COMPLETE
Height: 65 in
Weight: 4088.21 oz

## 2019-11-09 LAB — MAGNESIUM: Magnesium: 2.3 mg/dL (ref 1.7–2.4)

## 2019-11-09 LAB — HIV ANTIBODY (ROUTINE TESTING W REFLEX): HIV Screen 4th Generation wRfx: NONREACTIVE

## 2019-11-09 LAB — HEMOGLOBIN A1C
Hgb A1c MFr Bld: 6 % — ABNORMAL HIGH (ref 4.8–5.6)
Mean Plasma Glucose: 125.5 mg/dL

## 2019-11-09 LAB — TSH: TSH: 0.793 u[IU]/mL (ref 0.350–4.500)

## 2019-11-09 LAB — PHOSPHORUS: Phosphorus: 3.2 mg/dL (ref 2.5–4.6)

## 2019-11-09 MED ORDER — BUDESONIDE 0.5 MG/2ML IN SUSP
0.5000 mg | Freq: Two times a day (BID) | RESPIRATORY_TRACT | Status: DC
  Administered 2019-11-09 – 2019-11-10 (×2): 0.5 mg via RESPIRATORY_TRACT
  Filled 2019-11-09 (×2): qty 2

## 2019-11-09 MED ORDER — HYDROCERIN EX CREA
TOPICAL_CREAM | Freq: Two times a day (BID) | CUTANEOUS | Status: DC
  Filled 2019-11-09: qty 113

## 2019-11-09 NOTE — Evaluation (Signed)
Physical Therapy Evaluation Patient Details Name: Paula Torres MRN: [id number] DOB: [birth year] Today's Date: 11/09/2019   History of Present Illness  Pt admitted for COPD exacerbation with complaints of respiratory distress. HIstory includes COPD and CHF. Uptrending troponin, however no acute cardio symptoms, cleared to participate. At baseline, wears 3L of O2 day/night.  Clinical Impression  Pt is a pleasant 69 year old female who was admitted for COPD exacerbation. Pt performs transfers and ambulation with cga and no AD. Pt demonstrates deficits with endurance/balance. All mobility performed on 2L of O2 with sats at 96%-99%. Would benefit from skilled PT to address above deficits and promote optimal return to PLOF. Recommend transition to [place] upon discharge from acute hospitalization.     Follow Up Recommendations Home health PT    Equipment Recommendations  None recommended by PT    Recommendations for Other Services       Precautions / Restrictions Precautions Precautions: Fall Restrictions Weight Bearing Restrictions: No      Mobility  Bed Mobility               General bed mobility comments: not performed, received seated at EOB.  Transfers Overall transfer level: Needs assistance Equipment used: None Transfers: Sit to/from Stand Sit to Stand: Min guard         General transfer comment: cues for safety/line/lead management. All mobility performed on 2L of O2  Ambulation/Gait Ambulation/Gait assistance: Min guard Gait Distance (Feet): 200 Feet Assistive device: None Gait Pattern/deviations: Step-through pattern     General Gait Details: reaches out for hand rail/furniture during ambulation when she fatigues. Generally unsteady with fatigue with exertion. Multiple rest breaks with cues for pursed lip breathing. Cues for energy conservation. O2 sats maintain at 96%-99% with exertion. RR increases to 32 with mobility. During rest breaks, leans B arms onto  counter.  Stairs            Wheelchair Mobility    Modified Rankin (Stroke Patients Only)       Balance Overall balance assessment: Mild deficits observed, not formally tested                                           Pertinent Vitals/Pain Pain Assessment: No/denies pain    Home Living Family/patient expects to be discharged to:: Private residence Living Arrangements: Spouse/significant other Available Help at Discharge: Family Type of Home: House Home Access: Stairs to enter Entrance Stairs-Rails: Can reach both Entrance Stairs-Number of Steps: 3 Home Layout: One level Home Equipment: Walker - 2 wheels      Prior Function Level of Independence: Independent         Comments: household ambulator, no AD. Reports she is generally sedentary and lets children "wait" on her.     Hand Dominance        Extremity/Trunk Assessment   Upper Extremity Assessment Upper Extremity Assessment: Overall WFL for tasks assessed    Lower Extremity Assessment Lower Extremity Assessment: Generalized weakness(B LE grossly 4/5)       Communication   Communication: No difficulties  Cognition Arousal/Alertness: Awake/alert Behavior During Therapy: WFL for tasks assessed/performed Overall Cognitive Status: Within Functional Limits for tasks assessed  General Comments      Exercises     Assessment/Plan    PT Assessment Patient needs continued PT services  PT Problem List Decreased strength;Decreased activity tolerance;Decreased balance;Decreased mobility;Cardiopulmonary status limiting activity;Obesity       PT Treatment Interventions Gait training;DME instruction;Therapeutic exercise;Balance training    PT Goals (Current goals can be found in the Care Plan section)  Acute Rehab PT Goals Patient Stated Goal: to go home PT Goal Formulation: With patient Time For Goal Achievement:  11/23/19 Potential to Achieve Goals: Good    Frequency Min 2X/week   Barriers to discharge        Co-evaluation               AM-PAC PT "6 Clicks" Mobility  Outcome Measure Help needed turning from your back to your side while in a flat bed without using bedrails?: None Help needed moving from lying on your back to sitting on the side of a flat bed without using bedrails?: None Help needed moving to and from a bed to a chair (including a wheelchair)?: A Little Help needed standing up from a chair using your arms (e.g., wheelchair or bedside chair)?: A Little Help needed to walk in hospital room?: A Little Help needed climbing 3-5 steps with a railing? : A Little 6 Click Score: 20    End of Session Equipment Utilized During Treatment: Gait belt Activity Tolerance: Patient limited by fatigue Patient left: in chair(left with RN in room) Nurse Communication: Mobility status PT Visit Diagnosis: Unsteadiness on feet (R26.81);Muscle weakness (generalized) (M62.81);Difficulty in walking, not elsewhere classified (R26.2)    Time: 1610-9604 PT Time Calculation (min) (ACUTE ONLY): 27 min   Charges:   PT Evaluation $PT Eval Moderate Complexity: 1 Mod PT Treatments $Gait Training: 8-22 mins        Greggory Stallion, PT, DPT [phone]   Dawud Mays 11/09/2019, 2:50 PM

## 2019-11-09 NOTE — Consult Note (Addendum)
[place] Nurse Consult Note: Reason for Consult: Consult requested for "areas of excoriation."  Pt has multiple areas of dry red round scabbed dry lesions to arms and legs.  They are of unknown etiology; pt states they are a result of her itching constantly.  There are no open wounds or drainage requiring topical treatment. Pt has never been to a dermatologist and has not tried any perscription creams or lotions to resolve the issue.   Dressing procedure/placement/frequency: Orders provided for bedside nurses to perform to decrease itching; "Apply Eucerin cream to arms and legs and other dry scabbed areas BID."  Discussed with patient that we do not have an inpatient dermatologist on staff, and encouraged her to obtain an appointment after discharge for possible biopsy and assessment/prescription cream. Please re-consult if further assistance is needed.  Thank-you,  Julien Girt MSN, [place], [place], [place], [place]

## 2019-11-09 NOTE — Progress Notes (Signed)
PROGRESS NOTE    Paula Torres  OEU:[id number] DOB: March 02, 1951 DOA: 11/08/2019 PCP: [facility]    Brief Narrative:  Paula Torres is a 69 y.o. female with medical history significant of COPD,chronic diastolic CHF, OSA Presented with feeling progressively short of breath worse since this morning.  EMS was called oxygen saturation on room air 87% unable to speak in complete sentences end-tidal CO2 was checked and was 60 initially EMS administered albuterol Atrovent and 125 mg of Solu-Medrol patient was placed on CPAP on arrival and switched to BiPAP once in the emergency department. She has underlying COPD that has been progressive and been followed by pulmonology. Covid test negative    Consultants:   Cardiology  Procedures: CTA chest  Antimicrobials:   Azithromycin   Subjective: Feeling some better, would like to sit in chair. No other complaints.  Objective: Vitals:   11/09/19 1300 11/09/19 1340 11/09/19 1400 11/09/19 1500  BP:   (!) 105/58 113/61  Pulse: 83 83 81 78  Resp: (!) 27 (!) 24 (!) 33 (!) 24  Temp:   98.7 F (37.1 C)   TempSrc:   Oral   SpO2: 91% 95% 94% 90%  Weight:      Height:        Intake/Output Summary (Last 24 hours) at 11/09/2019 1555 Last data filed at 11/09/2019 1312 Gross per 24 hour  Intake 850.18 ml  Output 1800 ml  Net -949.82 ml   Filed Weights   11/08/19 1625 11/08/19 2220 11/09/19 0500  Weight: 116.6 kg 115.9 kg 115.9 kg    Examination:  General exam: Appears calm and comfortable, NAD, on nasal cannula Respiratory system: Clear to auscultation. Respiratory effort normal. Cardiovascular system: S1 & S2 heard, RRR. No  murmurs, rubs, gallops or clicks.  Gastrointestinal system: Abdomen is nondistended, soft and nontender.  Normal bowel sounds heard. Central nervous system: Alert and oriented. No focal neurological deficits. Extremities: edema b/l , chronic skin changes from pt scraching Skin: warn  dry Psychiatry: Judgement and insight appear normal. Mood & affect appropriate.     Data Reviewed: I have personally reviewed following labs and imaging studies  CBC: Recent Labs  Lab 11/08/19 1631 11/08/19 2248 11/09/19 0441  WBC 20.5* 18.6* 14.2*  NEUTROABS 17.3* 18.1* 13.5*  HGB 12.7 11.6* 11.9*  HCT 41.3 38.9 38.8  MCV 92.2 90.3 90.4  PLT 311 309 154   Basic Metabolic Panel: Recent Labs  Lab 11/08/19 1631 11/08/19 2248 11/09/19 0441  NA 138  --  137  K 4.4  --  4.8  CL 100  --  101  CO2 29  --  29  GLUCOSE 103*  --  156*  BUN 15  --  18  CREATININE 0.72  --  0.82  CALCIUM 9.1  --  9.0  MG  --  2.3 2.3  PHOS  --   --  3.2   GFR: Estimated Creatinine Clearance: 83.5 mL/min (by C-G formula based on SCr of 0.82 mg/dL). Liver Function Tests: Recent Labs  Lab 11/08/19 1631 11/09/19 0441  AST 16 16  ALT 21 18  ALKPHOS 99 87  BILITOT 0.9 0.8  PROT 7.6 7.1  ALBUMIN 3.8 3.4*   No results for input(s): LIPASE, AMYLASE in the last 168 hours. No results for input(s): AMMONIA in the last 168 hours. Coagulation Profile: Recent Labs  Lab 11/08/19 1631  INR 1.0   Cardiac Enzymes: No results for input(s): CKTOTAL, CKMB, CKMBINDEX, TROPONINI in the last 168  hours. BNP (last 3 results) No results for input(s): PROBNP in the last 8760 hours. HbA1C: Recent Labs    11/09/19 0914  HGBA1C 6.0*   CBG: Recent Labs  Lab 11/08/19 2210  GLUCAP 176*   Lipid Profile: Recent Labs    11/09/19 0914  CHOL 218*  HDL 49  LDLCALC 152*  TRIG 85  CHOLHDL 4.4   Thyroid Function Tests: Recent Labs    11/09/19 0441  TSH 0.793   Anemia Panel: No results for input(s): VITAMINB12, FOLATE, FERRITIN, TIBC, IRON, RETICCTPCT in the last 72 hours. Sepsis Labs: Recent Labs  Lab 11/08/19 1828  LATICACIDVEN 0.9    Recent Results (from the past 240 hour(s))  Respiratory Panel by RT PCR (Flu A&B, Covid) - Nasopharyngeal Swab     Status: None   Collection Time:  11/08/19  7:26 PM   Specimen: Nasopharyngeal Swab  Result Value Ref Range Status   SARS Coronavirus 2 by RT PCR NEGATIVE NEGATIVE Final    Comment: (NOTE) SARS-CoV-2 target nucleic acids are NOT DETECTED. The SARS-CoV-2 RNA is generally detectable in upper respiratoy specimens during the acute phase of infection. The lowest concentration of SARS-CoV-2 viral copies this assay can detect is 131 copies/mL. A negative result does not preclude SARS-Cov-2 infection and should not be used as the sole basis for treatment or other patient management decisions. A negative result may occur with  improper specimen collection/handling, submission of specimen other than nasopharyngeal swab, presence of viral mutation(s) within the areas targeted by this assay, and inadequate number of viral copies (<131 copies/mL). A negative result must be combined with clinical observations, patient history, and epidemiological information. The expected result is Negative. Fact Sheet for Patients:  PinkCheek.be Fact Sheet for Healthcare Providers:  GravelBags.it This test is not yet ap proved or cleared by the [place] FDA and  has been authorized for detection and/or diagnosis of SARS-CoV-2 by FDA under an Emergency Use Authorization (EUA). This EUA will remain  in effect (meaning this test can be used) for the duration of the COVID-19 declaration under Section 564(b)(1) of the Act, 21 U.S.C. section 360bbb-3(b)(1), unless the authorization is terminated or revoked sooner.    Influenza A by PCR NEGATIVE NEGATIVE Final   Influenza B by PCR NEGATIVE NEGATIVE Final    Comment: (NOTE) The Xpert Xpress SARS-CoV-2/FLU/RSV assay is intended as an aid in  the diagnosis of influenza from Nasopharyngeal swab specimens and  should not be used as a sole basis for treatment. Nasal washings and  aspirates are unacceptable for Xpert Xpress SARS-CoV-2/FLU/RSV   testing. Fact Sheet for Patients: PinkCheek.be Fact Sheet for Healthcare Providers: GravelBags.it This test is not yet approved or cleared by the [place] FDA and  has been authorized for detection and/or diagnosis of SARS-CoV-2 by  FDA under an Emergency Use Authorization (EUA). This EUA will remain  in effect (meaning this test can be used) for the duration of the  Covid-19 declaration under Section 564(b)(1) of the Act, 21  U.S.C. section 360bbb-3(b)(1), unless the authorization is  terminated or revoked. Performed at [facility], [address] [place], [place] [address]   Respiratory Panel by PCR     Status: None   Collection Time: 11/08/19  7:26 PM   Specimen: Nasopharyngeal Swab; Respiratory  Result Value Ref Range Status   Adenovirus NOT DETECTED NOT DETECTED Final   Coronavirus 229E NOT DETECTED NOT DETECTED Final    Comment: (NOTE) The Coronavirus on the Respiratory Panel, DOES  NOT test for the novel  Coronavirus (2019 nCoV)    Coronavirus HKU1 NOT DETECTED NOT DETECTED Final   Coronavirus NL63 NOT DETECTED NOT DETECTED Final   Coronavirus OC43 NOT DETECTED NOT DETECTED Final   Metapneumovirus NOT DETECTED NOT DETECTED Final   Rhinovirus / Enterovirus NOT DETECTED NOT DETECTED Final   Influenza A NOT DETECTED NOT DETECTED Final   Influenza B NOT DETECTED NOT DETECTED Final   Parainfluenza Virus 1 NOT DETECTED NOT DETECTED Final   Parainfluenza Virus 2 NOT DETECTED NOT DETECTED Final   Parainfluenza Virus 3 NOT DETECTED NOT DETECTED Final   Parainfluenza Virus 4 NOT DETECTED NOT DETECTED Final   Respiratory Syncytial Virus NOT DETECTED NOT DETECTED Final   Bordetella pertussis NOT DETECTED NOT DETECTED Final   Chlamydophila pneumoniae NOT DETECTED NOT DETECTED Final   Mycoplasma pneumoniae NOT DETECTED NOT DETECTED Final    Comment: Performed at [facility], [place] [address],  [place], [place] [address]  MRSA PCR Screening     Status: None   Collection Time: 11/08/19 10:17 PM   Specimen: Nasal Mucosa; Nasopharyngeal  Result Value Ref Range Status   MRSA by PCR NEGATIVE NEGATIVE Final    Comment:        The GeneXpert MRSA Assay (FDA approved for NASAL specimens only), is one component of a comprehensive MRSA colonization surveillance program. It is not intended to diagnose MRSA infection nor to guide or monitor treatment for MRSA infections. Performed at [facility], [address] [place], [place] [address]          Radiology Studies: DG Abd 1 View  Result Date: 11/08/2019 CLINICAL DATA:  69 year old female with abdominal distension. EXAM: ABDOMEN - 1 VIEW COMPARISON:  CTA chest 2126 hours today. FINDINGS: Three supine views. Excreted IV contrast in both renal collecting systems. Non obstructed bowel gas pattern. 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 Small dystrophic appearing calcifications in the pelvis could be vascular or fibroid related. No acute osseous abnormality identified. IMPRESSION: 1. Normal bowel gas pattern. 2. IV contrast being excreted from both kidneys. 3. Fibroid versus vascular calcifications in the pelvis. Electronically Signed   By: Genevie Ann M.D.   On: 11/08/2019 21:55   CT Angio Chest PE W and/or Wo Contrast  Result Date: 11/08/2019 CLINICAL DATA:  69 year old female with hypoxia. EXAM: CT ANGIOGRAPHY CHEST WITH CONTRAST TECHNIQUE: Multidetector CT imaging of the chest was performed using the standard protocol during bolus administration of intravenous contrast. Multiplanar CT image reconstructions and MIPs were obtained to evaluate the vascular anatomy. CONTRAST:  118m OMNIPAQUE IOHEXOL 350 MG/ML SOLN COMPARISON:  Chest radiograph dated 11/08/2019. FINDINGS: Evaluation of this exam is limited due to respiratory motion artifact. Cardiovascular: Top-normal cardiac size. No pericardial effusion.  Advanced 3 vessel coronary vascular calcification. There is moderate atherosclerotic calcification of the thoracic aorta. No aneurysmal dilatation or dissection. The origins of the great vessels of the aortic arch appear patent as visualized. There is mild dilatation of the main pulmonary trunk suggestive of a degree of pulmonary hypertension. Clinical correlation is recommended. Evaluation for PE is limited due to respiratory motion artifact and suboptimal opacification and timing of the contrast. No definite pulmonary artery embolus identified. Mediastinum/Nodes: Mildly enlarged right hilar lymph nodes measure up to approximately 2 cm. The esophagus is grossly unremarkable. No mediastinal fluid collection. Lungs/Pleura: There is background of emphysema. No focal consolidation, pleural effusion, or pneumothorax. The central airways are patent. Upper Abdomen: Fatty liver.  Partially visualized right renal cystic lesions. A 2.7 cm air pocket in the left upper abdomen, likely within a gastric diverticula. Musculoskeletal: Degenerative changes of the spine with multilevel anterior osteophyte. No acute osseous pathology. Review of the MIP images confirms the above findings. IMPRESSION: 1. No acute intrathoracic pathology. No definite CT evidence of pulmonary artery embolus. 2. Mildly enlarged right hilar lymph nodes, nonspecific. 3. Mildly dilated main pulmonary trunk suggestive of a degree of pulmonary hypertension. 4. Fatty liver. 5. A 2.7 cm air pocket in the left upper abdomen, likely within a gastric diverticula. 6. Coronary vascular calcification and aortic Atherosclerosis (ICD10-I70.0) and Emphysema (ICD10-J43.9). Electronically Signed   By: Anner Crete M.D.   On: 11/08/2019 21:47   DG Chest Portable 1 View  Result Date: 11/08/2019 CLINICAL DATA:  Shortness of breath since this morning. Hypoxia. EXAM: PORTABLE CHEST 1 VIEW COMPARISON:  None. FINDINGS: Normal sized heart. Prominent pulmonary vasculature and  interstitial markings. The lungs are hyperexpanded. No visible pleural fluid. Thoracic spine degenerative changes. IMPRESSION: Mild changes of congestive heart failure superimposed on COPD. Electronically Signed   By: Claudie Revering M.D.   On: 11/08/2019 16:52   ECHOCARDIOGRAM COMPLETE  Result Date: 11/09/2019    ECHOCARDIOGRAM REPORT   Patient Name:   SHANYCE Boquet Date of Exam: 11/09/2019 Medical Rec #:  [id number]      Height:       65.0 in Accession #:    [id number]     Weight:       255.5 lb Date of Birth:  [birth year]     BSA:          2.195 m Patient Age:    17 years       BP:           113/61 mmHg Patient Gender: F              HR:           78 bpm. Exam Location:  [facility] Procedure: 2D Echo, Cardiac Doppler and Color Doppler Indications:     CHF- acute diastolic 119.41  History:         Patient has no prior history of Echocardiogram examinations.                  CHF, COPD; Risk Factors:Hypertension.  Sonographer:     Sherrie Sport RDCS (AE) Referring Phys:  [place] Diagnosing Phys: Kathlyn Sacramento MD  Sonographer Comments: Technically challenging study due to limited acoustic windows. IMPRESSIONS  1. Left ventricular ejection fraction, by estimation, is 60 to 65%. The left ventricle has normal function. The left ventricle has no regional wall motion abnormalities. There is mild left ventricular hypertrophy. Left ventricular diastolic parameters are consistent with Grade I diastolic dysfunction (impaired relaxation).  2. Right ventricular systolic function is normal. The right ventricular size is normal. Tricuspid regurgitation signal is inadequate for assessing PA pressure.  3. Left atrial size was mildly dilated.  4. The mitral valve is normal in structure. No evidence of mitral valve regurgitation. No evidence of mitral stenosis.  5. The aortic valve is normal in structure. Aortic valve regurgitation is not visualized. No aortic stenosis is present.  6. The inferior vena cava is normal in size  with greater than 50% respiratory variability, suggesting right atrial pressure of 3 mmHg. FINDINGS  Left Ventricle: Left ventricular ejection fraction, by estimation, is 60 to 65%. The left ventricle has normal function. The left ventricle has no regional wall motion  abnormalities. The left ventricular internal cavity size was normal in size. There is  mild left ventricular hypertrophy. Left ventricular diastolic parameters are consistent with Grade I diastolic dysfunction (impaired relaxation). Right Ventricle: The right ventricular size is normal. No increase in right ventricular wall thickness. Right ventricular systolic function is normal. Tricuspid regurgitation signal is inadequate for assessing PA pressure. The tricuspid regurgitant velocity is 1.76 m/s, and with an assumed right atrial pressure of 10 mmHg, the estimated right ventricular systolic pressure is 38.4 mmHg. Left Atrium: Left atrial size was mildly dilated. Right Atrium: Right atrial size was normal in size. Pericardium: Trivial pericardial effusion is present. Mitral Valve: The mitral valve is normal in structure. Normal mobility of the mitral valve leaflets. Mild mitral annular calcification. No evidence of mitral valve regurgitation. No evidence of mitral valve stenosis. Tricuspid Valve: The tricuspid valve is normal in structure. Tricuspid valve regurgitation is not demonstrated. No evidence of tricuspid stenosis. Aortic Valve: The aortic valve is normal in structure. Aortic valve regurgitation is not visualized. No aortic stenosis is present. Aortic valve mean gradient measures 3.0 mmHg. Aortic valve peak gradient measures 6.2 mmHg. Aortic valve area, by VTI measures 2.24 cm. Pulmonic Valve: The pulmonic valve was normal in structure. Pulmonic valve regurgitation is not visualized. No evidence of pulmonic stenosis. Aorta: The aortic root is normal in size and structure. Venous: The inferior vena cava is normal in size with greater than 50%  respiratory variability, suggesting right atrial pressure of 3 mmHg. IAS/Shunts: No atrial level shunt detected by color flow Doppler.  LEFT VENTRICLE PLAX 2D LVIDd:         4.57 cm  Diastology LVIDs:         2.77 cm  LV e' lateral:   11.00 cm/s LV PW:         1.44 cm  LV E/e' lateral: 7.7 LV IVS:        1.17 cm  LV e' medial:    5.44 cm/s LVOT diam:     2.10 cm  LV E/e' medial:  15.5 LV SV:         52 LV SV Index:   24 LVOT Area:     3.46 cm  RIGHT VENTRICLE RV S prime:     12.70 cm/s TAPSE (M-mode): 3.1 cm LEFT ATRIUM             Index       RIGHT ATRIUM           Index LA diam:        4.10 cm 1.87 cm/m  RA Area:     17.00 cm LA Vol (A2C):   71.4 ml 32.53 ml/m RA Volume:   44.90 ml  20.45 ml/m LA Vol (A4C):   49.0 ml 22.32 ml/m LA Biplane Vol: 63.1 ml 28.74 ml/m  AORTIC VALVE                   PULMONIC VALVE AV Area (Vmax):    2.16 cm    PV Vmax:        0.53 m/s AV Area (Vmean):   2.09 cm    PV Peak grad:   1.1 mmHg AV Area (VTI):     2.24 cm    RVOT Peak grad: 3 mmHg AV Vmax:           124.50 cm/s AV Vmean:          74.100 cm/s AV VTI:  0.232 m AV Peak Grad:      6.2 mmHg AV Mean Grad:      3.0 mmHg LVOT Vmax:         77.60 cm/s LVOT Vmean:        44.800 cm/s LVOT VTI:          0.150 m LVOT/AV VTI ratio: 0.65  AORTA Ao Root diam: 3.20 cm MITRAL VALVE                TRICUSPID VALVE MV Area (PHT): 3.27 cm     TR Peak grad:   12.4 mmHg MV Decel Time: 232 msec     TR Vmax:        176.00 cm/s MV E velocity: 84.40 cm/s MV A velocity: 118.00 cm/s  SHUNTS MV E/A ratio:  0.72         Systemic VTI:  0.15 m                             Systemic Diam: 2.10 cm Kathlyn Sacramento MD Electronically signed by Kathlyn Sacramento MD Signature Date/Time: 11/09/2019/3:48:51 PM    Final         Scheduled Meds: . azithromycin  500 mg Oral Daily  . bisoprolol  5 mg Oral Daily  . budesonide (PULMICORT) nebulizer solution  0.5 mg Nebulization BID  . chlorhexidine  15 mL Mouth Rinse BID  . Chlorhexidine Gluconate  Cloth  6 each Topical Daily  . digoxin  0.125 mg Oral Daily  . enoxaparin (LOVENOX) injection  40 mg Subcutaneous Q12H  . escitalopram  20 mg Oral Daily  . furosemide  40 mg Intravenous Q12H  . hydrocerin   Topical BID  . ipratropium-albuterol  3 mL Nebulization Q6H  . mouth rinse  15 mL Mouth Rinse q12n4p  . methylPREDNISolone (SOLU-MEDROL) injection  40 mg Intravenous Q12H   Followed by  . [START ON 11/10/2019] predniSONE  40 mg Oral Q breakfast  . mometasone-formoterol  1 puff Inhalation BID  . polyethylene glycol  17 g Oral BID  . rOPINIRole  2 mg Oral QHS  . sodium chloride flush  3 mL Intravenous Q12H  . sodium chloride flush  3 mL Intravenous Q12H  . spironolactone  25 mg Oral Daily   Continuous Infusions: . sodium chloride      Assessment & Plan:   Active Problems:   COPD with acute exacerbation (HCC)   Acute on chronic diastolic CHF (congestive heart failure) (HCC)   Acute respiratory failure with hypoxia and hypercapnia (HCC)   Morbid obesity with BMI of 50.0-59.9, adult (HCC)   Elevated troponin  1.COPD with acute exacerbation (HCC) -   IV Steroid taper Antibiotics azithro Albuterol PRN, duoneb, Breo or Dulera at discharge Mucinex.  Titrate O2 to saturation >90%. Follow patients respiratory status. VBG - showing hypercarbia, likely chronic component BiPAP ordered PRN for increased work of breathing. Currently mentating well no evidence of symptomatic hypercarbia Ordered flutter valve COVID negative    2.Acute on chronic diastolic CHF (congestive heart failure) ([facility]) -telemetry,  On lasix will continue Echo pending   3.TP elevated-cardiology consulted-input appreciated, likely demand ischemia. Echo pending Once stabilized for pulmonary status may need ischemic work-up as outpatient  4.Acute respiratory failure with hypoxia and hypercapnia (HCC) - likley multifactorial.  Likely from hypoventilation syndrome and also COPD exacerbation. Continue  azithromycin  5.Morbid obesity -nutritional consult as an outpatient  6.Multiple areas of excoriation - wound  care consult  May benefit from out pt referral to dermatology as outpt. Anxiety likely is contributing will cont Lexapra  7.Restless leg syndrome on requip     DVT prophylaxis: Lovenox Code Status: Full Family Communication: None at bedside Disposition Plan: We will likely be here1- 2 midnight days as she is medically not stable. Barrier: medically not ready for discharge, needs still iv lasix, iv steroid, need to transition to po when able to. Echo pending.        LOS: 1 day   Time spent: 45 minutes with more than 50% COC    Nolberto Hanlon, MD [facility] Pager [phone]  If 7PM-7AM, please contact night-coverage [email or web address] Password [facility] 11/09/2019, 3:55 PM

## 2019-11-09 NOTE — Plan of Care (Signed)
Nutrition Education Note  RD identified need for nutrition education regarding CHF.  Met with patient at bedside. She was taken off BiPAP this morning and diet was advanced to heart healthy. Patient was eating her breakfast at time of RD assessment and reports her appetite is very good and unchanged from baseline. Patient reports typically eating 2 meals per day but occasionally will have a third meal. She typically skips breakfast but sometimes will have eggs with sausage. For lunch she reports having a large salad with toppings including cheese, croutons, meat, olives, and dressing. For dinner she may have spaghetti, fish sticks, or a meat with sides. Patient reports she does not use salt while cooking or at the table so she felt she was following a low salt diet. Reviewed some of the higher sodium items in her diet and reviewed lower sodium alternatives. Patient reports she has not been given a fluid restriction. She does not weigh herself every morning but does have a digital scale at home.  RD provided "Low Sodium Nutrition Therapy" handout from the Academy of [employer]. Reviewed patient's dietary recall. Provided examples on ways to decrease sodium intake in diet. Discouraged intake of processed foods and use of salt shaker. Encouraged fresh fruits and vegetables as well as whole grain sources of carbohydrates to maximize fiber intake.   RD discussed why it is important for patient to adhere to diet recommendations, and emphasized the role of fluids, foods to avoid, and importance of weighing self daily. Teach back method used.  Expect fair compliance.  Body mass index is 42.52 kg/m. Pt meets criteria for obesity class III based on current BMI.  Current diet order is heart healthy. Labs and medications reviewed. No further nutrition interventions warranted at this time. RD contact information provided. If additional nutrition issues arise, please re-consult RD.   Jacklynn Barnacle, MS,  RD, LDN Pager number available on Amion

## 2019-11-09 NOTE — Progress Notes (Signed)
*  PRELIMINARY RESULTS* Echocardiogram 2D Echocardiogram has been performed.  Paula Torres 11/09/2019, 3:13 PM

## 2019-11-09 NOTE — Progress Notes (Signed)
PT Cancellation Note  Patient Details Name: Paula Torres MRN: [id number] DOB: 1950/10/11   Cancelled Treatment:    Reason Eval/Treat Not Completed: Other (comment). Chart reviewed. Pt currently on bipap at this time due to respiratory distress. Pt also with elevated troponin 76, 338, 555 respectively. Pending cardio consult. Will continue to follow and evaluate once medically stable.   Shaughn Thomley 11/09/2019, 8:10 AM  Greggory Stallion, PT, DPT [phone]

## 2019-11-09 NOTE — Consult Note (Signed)
Cardiology Consultation:   Patient ID: Paula Torres; [id number]; December 16, 1950   Admit date: 11/08/2019 Date of Consult: 11/09/2019  Primary Care Provider: [facility] [place] Primary Cardiologist: New to [facility] - consult by Fletcher Anon   Patient Profile:   Paula Torres is a 69 y.o. female with a hx of severe COPD, reported HFpEF, OSA not on CPAP who is being seen today for the evaluation of elevated troponin at the request of Dr. Kurtis Bushman.  History of Present Illness:   Paula Torres has a documented history of HFpEF in PCP notes from Care Everywhere. She indicates she underwent an echo in [place] years ago that showed "one side of my heart was weak." She denies any prior stress test or cardiac cath. No prior echo for review. She has been managed by PCP on Toprol XL, spironolactone, and digoxin.   At baseline, she is on nocturnal oxygen. She sleeps in a recliner and has done so for 1-2 years. She indicates she typically gets these COPD flares a couple times per year with symptom improvement with azithromycin and prednisone.   Over the past 2 days leading up to her admission, she began to develop increased SOB and a nonproductive cough. No chest pain or palpitations. Stable lower extremity swelling. Compliant with medications at home. No changes in her diet. Outside of spironolactone, she is not on a diuretic at home. Given her symptoms, EMS was called and found her to be hypoxic and was placed on CPAP as well as given nebs.   Upon the patient's arrival to [facility] they were found to be hypoxic requiring BiPAP. BP stable. Afebrile. EKG showed NSR, 97 bpm, rare PAC, baseline wandering, no acute st/t changes, CXR showed COPD with mild CHF. CTA chest was negative for PE with noted enlarged lymph nodes, mildly dilated pulmonary artery, fatty liver, possible gastric diverticula, coronary artery calcification, and aortic atherosclerosis. Labs showed high sensitivity troponin 76-- with a delta of 338 and  currently trended to 555, respiratory panel and covid negative, BNP 85, digoxin 0.4, WBC 20.5-->18.6, HGB 12.7, potassium 4.4, SCR 0.72, TSH normal. In the ED, they received ASA 324 mg x 1 along with IV Lasix 40 mg x 1 and were placed on azithromycin and steroids/inhalers. Cardiology is asked to evaluate the elevated troponin. Denies any chest pain. Remains on BiPAP.   Past Medical History:  Diagnosis Date  . CHF (congestive heart failure) ([place])   . COPD (chronic obstructive pulmonary disease) ([place])   . Hypertension     Past Surgical History:  Procedure Laterality Date  . NO PAST SURGERIES       Home Meds: Prior to Admission medications   Medication Sig Start Date End Date Taking? Authorizing Provider  albuterol (VENTOLIN HFA) 108 (90 Base) MCG/ACT inhaler Inhale 2 puffs into the lungs every 6 (six) hours as needed for wheezing or shortness of breath.   Yes [provider]  budesonide-formoterol (SYMBICORT) 160-4.5 MCG/ACT inhaler Inhale 2 puffs into the lungs 2 (two) times daily.   Yes [provider]  bumetanide (BUMEX) 2 MG tablet Take 2 mg by mouth daily.    Yes [provider]  digoxin (LANOXIN) 0.125 MG tablet Take 0.125 mg by mouth daily.   Yes [provider]  escitalopram (LEXAPRO) 20 MG tablet Take 20 mg by mouth daily.   Yes [provider]  ipratropium-albuterol (DUONEB) 0.5-2.5 (3) MG/3ML SOLN Take 3 mLs by nebulization.   Yes [provider]  metoprolol succinate (  TOPROL-XL) 50 MG 24 hr tablet Take 50 mg by mouth daily. Take with or immediately following a meal.   Yes [provider]  rOPINIRole (REQUIP XL) 2 MG 24 hr tablet Take 2 mg by mouth at bedtime.   Yes [provider]  spironolactone (ALDACTONE) 25 MG tablet Take 25 mg by mouth daily.   Yes [provider]  tiotropium (SPIRIVA) 18 MCG inhalation capsule Place 18 mcg into inhaler and inhale daily.   Yes [provider]     Inpatient Medications: Scheduled Meds: . azithromycin  500 mg Oral Daily  . bisoprolol  5 mg Oral Daily  . budesonide (PULMICORT) nebulizer solution  2 mg Nebulization Q6H  . chlorhexidine  15 mL Mouth Rinse BID  . Chlorhexidine Gluconate Cloth  6 each Topical Daily  . digoxin  0.125 mg Oral Daily  . enoxaparin (LOVENOX) injection  40 mg Subcutaneous Q12H  . escitalopram  20 mg Oral Daily  . furosemide  40 mg Intravenous Q12H  . ipratropium-albuterol  3 mL Nebulization Q6H  . mouth rinse  15 mL Mouth Rinse q12n4p  . methylPREDNISolone (SOLU-MEDROL) injection  40 mg Intravenous Q12H   Followed by  . [START ON 11/10/2019] predniSONE  40 mg Oral Q breakfast  . mometasone-formoterol  1 puff Inhalation BID  . polyethylene glycol  17 g Oral BID  . rOPINIRole  2 mg Oral QHS  . sodium chloride flush  3 mL Intravenous Q12H  . sodium chloride flush  3 mL Intravenous Q12H  . spironolactone  25 mg Oral Daily   Continuous Infusions: . sodium chloride     PRN Meds: sodium chloride, acetaminophen **OR** acetaminophen, albuterol, bisacodyl, HYDROcodone-acetaminophen, ondansetron **OR** ondansetron (ZOFRAN) IV, sodium chloride flush  Allergies:   Allergies  Allergen Reactions  . Codeine     Headache  . Sulfa Antibiotics     Social History:   Social History   Socioeconomic History  . Marital status: Married    Spouse name: Not on file  . Number of children: Not on file  . Years of education: Not on file  . Highest education level: Not on file  Occupational History  . Not on file  Tobacco Use  . Smoking status: Former Research scientist (life sciences)  . Smokeless tobacco: Never Used  Substance and Sexual Activity  . Alcohol use: Not Currently  . Drug use: Never  . Sexual activity: Not on file  Other Topics Concern  . Not on file  Social History Narrative  . Not on file   Social Determinants of Health   Financial Resource Strain:   . Difficulty of Paying Living Expenses: Not on file  Food  Insecurity:   . Worried About Charity fundraiser in the Last Year: Not on file  . Ran Out of Food in the Last Year: Not on file  Transportation Needs:   . Lack of Transportation (Medical): Not on file  . Lack of Transportation (Non-Medical): Not on file  Physical Activity:   . Days of Exercise per Week: Not on file  . Minutes of Exercise per Session: Not on file  Stress:   . Feeling of Stress : Not on file  Social Connections:   . Frequency of Communication with Friends and Family: Not on file  . Frequency of Social Gatherings with Friends and Family: Not on file  . Attends Religious Services: Not on file  . Active Member of Clubs or Organizations: Not on file  . Attends Club or  Organization Meetings: Not on file  . Marital Status: Not on file  Intimate Partner Violence:   . Fear of Current or Ex-Partner: Not on file  . Emotionally Abused: Not on file  . Physically Abused: Not on file  . Sexually Abused: Not on file     Family History:   Family History  Problem Relation Age of Onset  . Hypertension Other     ROS:  Review of Systems  Constitutional: Positive for malaise/fatigue. Negative for chills, diaphoresis, fever and weight loss.  HENT: Negative for congestion.   Eyes: Negative for discharge and redness.  Respiratory: Positive for cough and shortness of breath. Negative for sputum production and wheezing.   Cardiovascular: Positive for orthopnea and leg swelling. Negative for chest pain, palpitations, claudication and PND.       She has slept in a recliner for 1-2 years  Gastrointestinal: Negative for abdominal pain, heartburn, nausea and vomiting.  Musculoskeletal: Negative for falls and myalgias.  Skin: Negative for rash.  Neurological: Positive for weakness. Negative for dizziness, tingling, tremors, sensory change, speech change, focal weakness and loss of consciousness.  Psychiatric/Behavioral: Negative for substance abuse. The patient is not nervous/anxious.     All other systems reviewed and are negative.     Physical Exam/Data:   Vitals:   11/09/19 0300 11/09/19 0400 11/09/19 0500 11/09/19 0600  BP: (!) 118/53 (!) 107/56 122/62 137/70  Pulse: 80 79 79 82  Resp: (!) _0 (!) 25  Temp:   98.9 F (37.2 C)   TempSrc:   Oral   SpO2: 91% (!) 89% 91% 100%  Weight:   115.9 kg   Height:        Intake/Output Summary (Last 24 hours) at 11/09/2019 0750 Last data filed at 11/09/2019 2025 Gross per 24 hour  Intake 250.18 ml  Output 600 ml  Net -349.82 ml   Filed Weights   11/08/19 1625 11/08/19 2220 11/09/19 0500  Weight: 116.6 kg 115.9 kg 115.9 kg   Body mass index is 42.52 kg/m.   Physical Exam: General: Well developed, well nourished, in no acute distress. Head: Normocephalic, atraumatic, sclera non-icteric, no xanthomas, nares without discharge.  Neck: Negative for carotid bruits. JVD not elevated. Lungs: Diminished breaths with crackles along the bilateral bases. Breathing is unlabored. Remains on BiPAP.  Heart: RRR with S1 S2. No murmurs, rubs, or gallops appreciated. Abdomen: Soft, non-tender, non-distended with normoactive bowel sounds. No hepatomegaly. No rebound/guarding. No obvious abdominal masses. Msk:  Strength and tone appear normal for age. Extremities: No clubbing or cyanosis. Trace to 1+ bilateral lower extremity edema with excoriations noted. Distal pedal pulses are 2+ and equal bilaterally. Neuro: Alert and oriented X 3. No facial asymmetry. No focal deficit. Moves all extremities spontaneously. Psych:  Responds to questions appropriately with a normal affect.   EKG:  The EKG was personally reviewed and demonstrates: NSR, 97 bpm, rare PAC, baseline wandering, no acute st/t changes Telemetry:  Telemetry was personally reviewed and demonstrates: SR, 10 beat run of atrial tachycardia  Weights: Filed Weights   11/08/19 1625 11/08/19 2220 11/09/19 0500  Weight: 116.6 kg 115.9 kg 115.9 kg    Relevant CV  Studies: Echo pending  Laboratory Data:  Chemistry Recent Labs  Lab 11/08/19 1631 11/09/19 0441  NA 138 137  K 4.4 4.8  CL 100 101  CO2 29 29  GLUCOSE 103* 156*  BUN 15 18  CREATININE 0.72 0.82  CALCIUM 9.1 9.0  GFRNONAA >60 >60  GFRAA >60 >60  ANIONGAP 9 7    Recent Labs  Lab 11/08/19 1631 11/09/19 0441  PROT 7.6 7.1  ALBUMIN 3.8 3.4*  AST 16 16  ALT 21 18  ALKPHOS 99 87  BILITOT 0.9 0.8   Hematology Recent Labs  Lab 11/08/19 1631 11/08/19 2248 11/09/19 0441  WBC 20.5* 18.6* 14.2*  RBC 4.48 4.31 4.29  HGB 12.7 11.6* 11.9*  HCT 41.3 38.9 38.8  MCV 92.2 90.3 90.4  MCH 28.3 26.9 27.7  MCHC 30.8 29.8* 30.7  RDW 16.3* 16.1* 16.2*  PLT 311 309 297   Cardiac EnzymesNo results for input(s): TROPONINI in the last 168 hours. No results for input(s): TROPIPOC in the last 168 hours.  BNP Recent Labs  Lab 11/08/19 1631  BNP 85.0    DDimer No results for input(s): DDIMER in the last 168 hours.  Radiology/Studies:  DG Abd 1 View  Result Date: 11/08/2019 IMPRESSION: 1. Normal bowel gas pattern. 2. IV contrast being excreted from both kidneys. 3. Fibroid versus vascular calcifications in the pelvis. Electronically Signed   By: Genevie Ann M.D.   On: 11/08/2019 21:55   CT Angio Chest PE W and/or Wo Contrast  Result Date: 11/08/2019 IMPRESSION: 1. No acute intrathoracic pathology. No definite CT evidence of pulmonary artery embolus. 2. Mildly enlarged right hilar lymph nodes, nonspecific. 3. Mildly dilated main pulmonary trunk suggestive of a degree of pulmonary hypertension. 4. Fatty liver. 5. A 2.7 cm air pocket in the left upper abdomen, likely within a gastric diverticula. 6. Coronary vascular calcification and aortic Atherosclerosis (ICD10-I70.0) and Emphysema (ICD10-J43.9). Electronically Signed   By: Anner Crete M.D.   On: 11/08/2019 21:47   DG Chest Portable 1 View  Result Date: 11/08/2019 IMPRESSION: Mild changes of congestive heart failure superimposed on  COPD. Electronically Signed   By: Claudie Revering M.D.   On: 11/08/2019 16:52    Assessment and Plan:   1. Elevated troponin/coronary artery calcification: -Never with chest pain -Admitted with acute on chronic hypoxic respiratory failure secondary to AECOPD and acute on chronic HFpEF/pulmonary hypertension -Check echo -Cycle troponin to peak -Currently, no indication for heparin gtt -ASA -Check Lipitor and A1c for further risk stratification  -Pending troponin bump and echo results, plan for outpatient ischemic workup   2. Acute on chronic hypoxic respiratory failure: -BiPAP per primary service, wean as able -Multifactorial including AECOPD, HFpEF, pulmonary hypertension, OSA with possible OHS, and morbid obesity  -As above and below  3. Acute on chronic HFpEF/pulmonary hypertension: -She remains volume up and is receiving steroids  -IV Lasix 40 mg bid with KCl repletion  -Metoprolol changed to bisoprolol  -Continue PTA spironolactone and digoxin  -Echo -Daily weights -Strict I/O -Her pulmonary hypertension is likely in the setting of COPD and untreated OSA -Recommend she follow up with pulmonology as an outpatient for further evaluation and management, including CPAP usage   4. AECOPD: -Per IM -Enlarged lymph nodes noted, likely inflammatory, follow up with PCP   5. Atrial tachycardia: -10 beat run -Asymptomatic -TSH normal -Potassium and magnesium at goal -Bisoprolol started in place of Toprol XL in the setting of the above   For questions or updates, please contact [facility] Please consult [email or web address] for contact info under Cardiology/STEMI.   Signed, Christell Faith, PA-C [facility] Pager: [phone] 11/09/2019, 7:50 AM

## 2019-11-09 NOTE — Progress Notes (Signed)
Nutrition Brief Note  RD received consult for obesity. Not appropriate to address weight loss in setting of acute illness. Consider referral to outpatient RD for weight loss once acute illness has resolved. RD did provide handout and education regarding nutrition for CHF (note to follow).  Jacklynn Barnacle, MS, RD, LDN Pager number available on Amion

## 2019-11-09 NOTE — TOC Initial Note (Signed)
Transition of Care [facility]) - Initial/Assessment Note    Patient Details  Name: Paula Torres MRN: [id number] Date of Birth: [birth year]  Transition of Care [facility]) CM/SW Contact:    Magnus Ivan, LCSW Phone Number: 11/09/2019, 11:35 AM  Clinical Narrative:             CSW met with patient for heart failure screen. Patient reported she has a scale at home and weighs herself every other day. CSW provided education on heart failure protocol and to call Provider with weight gain of 2+ lbs overnight or 5+ lbs in one week. Patient verbalized understanding. Patient reported she is followed by Dr. Sabra Heck (PCP) and Dr. Lanney Gins. CSW inquired if patient would be open to [facility] PT/OT if it is recommended, patient reported she would be open to this. She did not have a preference on agencies. CSW provided list of agencies from Medicare for patient to refer to if these services are recommended. CSW will continue to follow.         Patient Goals and CMS Choice        Expected Discharge Plan and Services                                                Prior Living Arrangements/Services                       Activities of Daily Living Home Assistive Devices/Equipment: None    Permission Sought/Granted                  Emotional Assessment              Admission diagnosis:  Abdominal distention [R14.0] COPD with acute exacerbation (HCC) [J44.1] Acute respiratory failure with hypoxia and hypercapnia ([place]) [J96.01, J96.02] Patient Active Problem List   Diagnosis Date Noted  . COPD with acute exacerbation ([place]) 11/08/2019  . Acute on chronic diastolic CHF (congestive heart failure) ([place]) 11/08/2019  . Acute respiratory failure with hypoxia and hypercapnia ([place]) 11/08/2019  . Morbid obesity with BMI of 50.0-59.9, adult ([place]) 11/08/2019  . Elevated troponin 11/08/2019   PCP:  [facility] [place]:  No Pharmacies Listed    Social  Determinants of Health (SDOH) Interventions    Readmission Risk Interventions No flowsheet data found.

## 2019-11-09 NOTE — Progress Notes (Signed)
OT Cancellation Note  Patient Details Name: Paula Torres MRN: [id number] DOB: 18-Jun-1951   Cancelled Treatment:    Reason Eval/Treat Not Completed: Medical issues which prohibited therapy  OT consult received and chart reviewed. Upon chart review this AM, pt noted to have elevated troponin, trending up (76, 338, and 555 respectively). Cardiology consulted and per note, pending echo at this time. In addition, pt on BiPap d/t respiratory distress. Will f/u as pt becomes more appropriate for activity to complete OT evaluation. Thank you.  Gerrianne Scale, [place], OTR/L ascom [phone] 11/09/19, 9:26 AM

## 2019-11-10 DIAGNOSIS — I248 Other forms of acute ischemic heart disease: Secondary | ICD-10-CM

## 2019-11-10 DIAGNOSIS — E785 Hyperlipidemia, unspecified: Secondary | ICD-10-CM

## 2019-11-10 LAB — CBC WITH DIFFERENTIAL/PLATELET
Abs Immature Granulocytes: 0.1 10*3/uL — ABNORMAL HIGH (ref 0.00–0.07)
Basophils Absolute: 0 10*3/uL (ref 0.0–0.1)
Basophils Relative: 0 %
Eosinophils Absolute: 0 10*3/uL (ref 0.0–0.5)
Eosinophils Relative: 0 %
HCT: 38.8 % (ref 36.0–46.0)
Hemoglobin: 12.2 g/dL (ref 12.0–15.0)
Immature Granulocytes: 1 %
Lymphocytes Relative: 5 %
Lymphs Abs: 0.8 10*3/uL (ref 0.7–4.0)
MCH: 27.5 pg (ref 26.0–34.0)
MCHC: 31.4 g/dL (ref 30.0–36.0)
MCV: 87.6 fL (ref 80.0–100.0)
Monocytes Absolute: 0.7 10*3/uL (ref 0.1–1.0)
Monocytes Relative: 4 %
Neutro Abs: 16.9 10*3/uL — ABNORMAL HIGH (ref 1.7–7.7)
Neutrophils Relative %: 90 %
Platelets: 369 10*3/uL (ref 150–400)
RBC: 4.43 MIL/uL (ref 3.87–5.11)
RDW: 16.7 % — ABNORMAL HIGH (ref 11.5–15.5)
WBC: 18.6 10*3/uL — ABNORMAL HIGH (ref 4.0–10.5)
nRBC: 0.2 % (ref 0.0–0.2)

## 2019-11-10 LAB — COMPREHENSIVE METABOLIC PANEL
ALT: 16 U/L (ref 0–44)
AST: 14 U/L — ABNORMAL LOW (ref 15–41)
Albumin: 3.4 g/dL — ABNORMAL LOW (ref 3.5–5.0)
Alkaline Phosphatase: 83 U/L (ref 38–126)
Anion gap: 14 (ref 5–15)
BUN: 36 mg/dL — ABNORMAL HIGH (ref 8–23)
CO2: 30 mmol/L (ref 22–32)
Calcium: 9.8 mg/dL (ref 8.9–10.3)
Chloride: 94 mmol/L — ABNORMAL LOW (ref 98–111)
Creatinine, Ser: 1.03 mg/dL — ABNORMAL HIGH (ref 0.44–1.00)
GFR calc Af Amer: >60 mL/min (ref >60)
GFR calc non Af Amer: 56 mL/min — ABNORMAL LOW (ref >60)
Glucose, Bld: 178 mg/dL — ABNORMAL HIGH (ref 70–99)
Potassium: 4.3 mmol/L (ref 3.5–5.1)
Sodium: 138 mmol/L (ref 135–145)
Total Bilirubin: 0.8 mg/dL (ref 0.3–1.2)
Total Protein: 6.5 g/dL (ref 6.5–8.1)

## 2019-11-10 LAB — BLOOD GAS, ARTERIAL
Acid-Base Excess: 14.3 mmol/L — ABNORMAL HIGH (ref 0.0–2.0)
Bicarbonate: 39.2 mmol/L — ABNORMAL HIGH (ref 20.0–28.0)
FIO2: 0.24
O2 Saturation: 96.7 %
Patient temperature: 37
pCO2 arterial: 48 mmHg (ref 32.0–48.0)
pH, Arterial: 7.52 — ABNORMAL HIGH (ref 7.350–7.450)
pO2, Arterial: 78 mmHg — ABNORMAL LOW (ref 83.0–108.0)

## 2019-11-10 MED ORDER — MORPHINE SULFATE (PF) 2 MG/ML IV SOLN: 2.0000 mg | Freq: Once | INTRAVENOUS | Status: DC

## 2019-11-10 MED ORDER — FAMOTIDINE 20 MG PO TABS: 20.0000 mg | ORAL_TABLET | Freq: Every day | ORAL | 1 refills | Status: DC

## 2019-11-10 MED ORDER — BISOPROLOL FUMARATE 5 MG PO TABS: 5.0000 mg | ORAL_TABLET | Freq: Every day | ORAL | 0 refills | Status: DC

## 2019-11-10 MED ORDER — ATORVASTATIN CALCIUM 20 MG PO TABS: 20.0000 mg | ORAL_TABLET | Freq: Every day | ORAL | 0 refills | Status: DC

## 2019-11-10 MED ORDER — FUROSEMIDE 40 MG PO TABS: 40.0000 mg | ORAL_TABLET | Freq: Every day | ORAL | 0 refills | Status: DC

## 2019-11-10 MED ORDER — AZITHROMYCIN 250 MG PO TABS: 250.0000 mg | ORAL_TABLET | Freq: Every day | ORAL | 0 refills | Status: AC

## 2019-11-10 MED ORDER — ATORVASTATIN CALCIUM 20 MG PO TABS: 20.0000 mg | ORAL_TABLET | Freq: Every day | ORAL | Status: DC

## 2019-11-10 MED ORDER — FUROSEMIDE 40 MG PO TABS: 40.0000 mg | ORAL_TABLET | Freq: Every day | ORAL | Status: DC

## 2019-11-10 MED ORDER — ASPIRIN EC 81 MG PO TBEC: 81.0000 mg | DELAYED_RELEASE_TABLET | Freq: Every day | ORAL | 0 refills | Status: AC

## 2019-11-10 MED ORDER — PREDNISONE 20 MG PO TABS: 20.0000 mg | ORAL_TABLET | Freq: Every day | ORAL | 0 refills | Status: AC

## 2019-11-10 NOTE — Progress Notes (Cosign Needed)
Pt continues to exhibit signs of Hypoxia associated with Chronic Respiratory Failure secondary to Severe COPD. Patient requires the use of NIV both QHS and daytime to help with exacerbation periods. The use of the NIV will treat patient's Low PFT (FEV1/FVC) Levels and high Venous PCO2 levels, this can reduce risk of exacerbations and future hospitalizations when used at night and during the day. Pt will need these advanced settings in conjunction with her current medication regimen; BIPAP is not an option due to its functional limitations and the severity of the patient's condition. Failure to have NIV available for use over a 24- hour period could lead to death. Patient is able to clear and maintain their own airway.

## 2019-11-10 NOTE — Plan of Care (Signed)
  Problem: Food- and Nutrition-Related Knowledge Deficit (NB-1.1) Goal: Nutrition education Description: Formal process to instruct or train a patient/client in a skill or to impart knowledge to help patients/clients voluntarily manage or modify food choices and eating behavior to maintain or improve health. Outcome: Adequate for Discharge   Problem: Acute Rehab PT Goals(only PT should resolve) Goal: Pt Will Ambulate Outcome: Adequate for Discharge Goal: PT Additional Goal #1 Outcome: Adequate for Discharge

## 2019-11-10 NOTE — Progress Notes (Signed)
Physical Therapy Treatment Patient Details Name: Paula Torres MRN: [id number] DOB: 01/15/1951 Today's Date: 11/10/2019    History of Present Illness Pt admitted for COPD exacerbation with complaints of respiratory distress. HIstory includes COPD and CHF. Uptrending troponin, however no acute cardio symptoms, cleared to participate. At baseline, wears 3L of O2 day/night.    PT Comments    Pt is making good progress towards goals with increased endurance noted this date. All mobility performed without AD on 3L of O2. Sats decrease with exertion to 86% with cues for pursed lip breathing. With seated break, O2 sats return to WNL within a few minutes. Pt appears slightly anxious with mobility asking "how am I doing" frequently. Pt on 3L of o2 at home at baseline. Continue to recommend HHPT for endurance and energy conservation.   Follow Up Recommendations  Home health PT     Equipment Recommendations  None recommended by PT    Recommendations for Other Services       Precautions / Restrictions Precautions Precautions: Fall Restrictions Weight Bearing Restrictions: No    Mobility  Bed Mobility Overal bed mobility: Independent             General bed mobility comments: safe technique with ease of technique  Transfers Overall transfer level: Needs assistance Equipment used: None Transfers: Sit to/from Stand Sit to Stand: Supervision         General transfer comment: safe technique with no AD required.  Ambulation/Gait Ambulation/Gait assistance: Supervision Gait Distance (Feet): 220 Feet Assistive device: None Gait Pattern/deviations: Step-through pattern     General Gait Details: ambulated around RN station with safe technique. 1 standing rest break required due to fatigue/SOB symptoms. All mobility performed on 3L of O2 with sats decreasing to 86% with exertion. HR increases to 101bpm. Cues for pursed lip breathing with sats improving to 92%.   Stairs              Wheelchair Mobility    Modified Rankin (Stroke Patients Only)       Balance                                            Cognition Arousal/Alertness: Awake/alert Behavior During Therapy: WFL for tasks assessed/performed Overall Cognitive Status: Within Functional Limits for tasks assessed                                        Exercises Other Exercises Other Exercises: Pt able to ambulate to bathroom with supervision including perform hygiene and hand washing safely.  Other Exercises: Sit<>stand ther-ex performed x 10 reps with supervision and cues for breathing. Also performed 10 reps of seated LAQ. All with supervision    General Comments        Pertinent Vitals/Pain Pain Assessment: No/denies pain    Home Living                      Prior Function            PT Goals (current goals can now be found in the care plan section) Acute Rehab PT Goals Patient Stated Goal: to go home PT Goal Formulation: With patient Time For Goal Achievement: 11/23/19 Potential to Achieve Goals: Good Progress towards PT goals: Progressing toward goals  Frequency    Min 2X/week      PT Plan Current plan remains appropriate    Co-evaluation              AM-PAC PT "6 Clicks" Mobility   Outcome Measure  Help needed turning from your back to your side while in a flat bed without using bedrails?: None Help needed moving from lying on your back to sitting on the side of a flat bed without using bedrails?: None Help needed moving to and from a bed to a chair (including a wheelchair)?: None Help needed standing up from a chair using your arms (e.g., wheelchair or bedside chair)?: None Help needed to walk in hospital room?: A Little Help needed climbing 3-5 steps with a railing? : A Little 6 Click Score: 22    End of Session Equipment Utilized During Treatment: Gait belt Activity Tolerance: Patient limited by  fatigue Patient left: in chair;with chair alarm set Nurse Communication: Mobility status PT Visit Diagnosis: Unsteadiness on feet (R26.81);Muscle weakness (generalized) (M62.81);Difficulty in walking, not elsewhere classified (R26.2)     Time: 7048-8891 PT Time Calculation (min) (ACUTE ONLY): 27 min  Charges:  $Gait Training: 8-22 mins $Therapeutic Exercise: 8-22 mins                     Paula Torres, PT, DPT [phone]    Paula Torres 11/10/2019, 9:58 AM

## 2019-11-10 NOTE — Discharge Summary (Signed)
Dmya Long EPP:[id number] DOB: 1951/01/10 DOA: 11/08/2019  PCP: [facility]  Admit date: 11/08/2019 Discharge date: 11/10/2019  Admitted From: Home Disposition: Home  Recommendations for Outpatient Follow-up:  1. Follow up with PCP in 1 week 2. Please obtain BMP/CBC in one week 3. Follow-up pulmonary Dr. Dallie Dad in one week 4. Follow up cardiology Dr. Fletcher Anon in one week  Home Health:yes   Discharge Condition:Stable CODE STATUS:full  Diet recommendation: Heart Healthy / Carb Modified / Regular / Dysphagia  Brief/Interim Summary: Breunna Nordmann is a 69 y.o. female with medical history significant of COPD,chronic diastolic CHF, OSA presented with feeling progressively short of breath worse since this morning.  EMS was called oxygen saturation on room air 87% unable to speak in complete sentences end-tidal CO2 was checked and was 60 initially EMS administered albuterol Atrovent and 125 mg of Solu-Medrol patient was placed on CPAP on arrival and switched to BiPAP once in the emergency department. She has underlying COPD that has been progressive and been followed by pulmonology.  She was admitted to stepdown unit.  Was treated for COPD with acute exacerbation with steroid taper and azithromycin.  Also treated for acute respiratory failure with hypoxia and hypercapnia which was likely multifactorial.  Covid was negative.  She was also found and treated for acute on chronic diastolic heart failure.  Her troponins were elevated cardiology was consulted.  Her elevated troponin was likely secondary to supply demand ischemia in the setting of her respiratory issues.  Cardiology recommended aspirin and f/u as  outpatient for  Noninvasive ischemic testing. Echocardiogram results below. Pulmonary was consulted they recommended prednisone and Z-Pak for the next 5 days.  She will follow up with pulmonary for BiPAP as to be used as outpatient.  She uses 3 L of oxygen at home but she will  require 4 L with ambulation.  From pulmonary and cardiac standpoint she is stable to be discharged home.    Discharge Diagnoses:  Active Problems:   COPD with acute exacerbation (HCC)   Acute on chronic diastolic CHF (congestive heart failure) (HCC)   Acute respiratory failure with hypoxia and hypercapnia (HCC)   Morbid obesity with BMI of 50.0-59.9, adult (HCC)   Elevated troponin    Discharge Instructions  Discharge Instructions    Call MD for:  difficulty breathing, headache or visual disturbances   Complete by: As directed    Diet - low sodium heart healthy   Complete by: As directed    Discharge instructions   Complete by: As directed    Follow up pulmonary within one week F/u with cardiology in one week F/u with pcp in one week   Increase activity slowly   Complete by: As directed      Allergies as of 11/10/2019      Reactions   Codeine    Back Headache   Sulfa Antibiotics    Reported to pt from mother who is no longer living -  Details unknown      Medication List    STOP taking these medications   bumetanide 2 MG tablet Commonly known as: BUMEX   metoprolol succinate 50 MG 24 hr tablet Commonly known as: TOPROL-XL     TAKE these medications   albuterol 108 (90 Base) MCG/ACT inhaler Commonly known as: VENTOLIN HFA Inhale 2 puffs into the lungs every 6 (six) hours as needed for wheezing or shortness of breath.   aspirin EC 81 MG tablet Take 1 tablet (81 mg total) by  mouth daily for 30 doses.   atorvastatin 20 MG tablet Commonly known as: LIPITOR Take 1 tablet (20 mg total) by mouth daily at 6 PM.   azithromycin 250 MG tablet Commonly known as: ZITHROMAX Take 1 tablet (250 mg total) by mouth daily for 5 days. Start taking on: November 11, 2019   bisoprolol 5 MG tablet Commonly known as: ZEBETA Take 1 tablet (5 mg total) by mouth daily. Start taking on: November 11, 2019   budesonide-formoterol 160-4.5 MCG/ACT inhaler Commonly known as:  SYMBICORT Inhale 2 puffs into the lungs 2 (two) times daily.   digoxin 0.125 MG tablet Commonly known as: LANOXIN Take 0.125 mg by mouth daily.   escitalopram 20 MG tablet Commonly known as: LEXAPRO Take 20 mg by mouth daily.   famotidine 20 MG tablet Commonly known as: PEPCID Take 1 tablet (20 mg total) by mouth daily for 30 doses.   furosemide 40 MG tablet Commonly known as: LASIX Take 1 tablet (40 mg total) by mouth daily. Start taking on: November 11, 2019   ipratropium-albuterol 0.5-2.5 (3) MG/3ML Soln Commonly known as: DUONEB Take 3 mLs by nebulization.   predniSONE 20 MG tablet Commonly known as: DELTASONE Take 1 tablet (20 mg total) by mouth daily with breakfast for 5 days. Start taking on: November 11, 2019   rOPINIRole 2 MG 24 hr tablet Commonly known as: REQUIP XL Take 2 mg by mouth at bedtime.   spironolactone 25 MG tablet Commonly known as: ALDACTONE Take 25 mg by mouth daily.   tiotropium 18 MCG inhalation capsule Commonly known as: SPIRIVA Place 18 mcg into inhaler and inhale daily.      Follow-up Information    [facility] Follow up on 11/18/2019.   Specialty: Cardiology Why: at 10:30am. Enter through the [facility] entrance Contact information: [place]       Wellington Hampshire, MD Follow up in 1 week(s).   Specialty: Cardiology Contact information: [place] [address] [phone]        Ottie Glazier, MD Follow up in 1 week(s).   Specialty: Pulmonary Disease Contact information: [place] [address] [phone]          Allergies  Allergen Reactions  . Codeine     Back Headache  . Sulfa Antibiotics     Reported to pt from mother who is no longer living -  Details unknown    Consultations:   Cardiology, pulmonary  Procedures/Studies: DG Abd 1  View  Result Date: 11/08/2019 CLINICAL DATA:  69 year old female with abdominal distension. EXAM: ABDOMEN - 1 VIEW COMPARISON:  CTA chest 2126 hours today. FINDINGS: Three supine views. Excreted IV contrast in both renal collecting systems. Non obstructed bowel gas pattern. 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 Small dystrophic appearing calcifications in the pelvis could be vascular or fibroid related. No acute osseous abnormality identified. IMPRESSION: 1. Normal bowel gas pattern. 2. IV contrast being excreted from both kidneys. 3. Fibroid versus vascular calcifications in the pelvis. Electronically Signed   By: Genevie Ann M.D.   On: 11/08/2019 21:55   CT Angio Chest PE W and/or Wo Contrast  Result Date: 11/08/2019 CLINICAL DATA:  69 year old female with hypoxia. EXAM: CT ANGIOGRAPHY CHEST WITH CONTRAST TECHNIQUE: Multidetector CT imaging of the chest was performed using the standard protocol during bolus administration of intravenous contrast. Multiplanar  CT image reconstructions and MIPs were obtained to evaluate the vascular anatomy. CONTRAST:  140m OMNIPAQUE IOHEXOL 350 MG/ML SOLN COMPARISON:  Chest radiograph dated 11/08/2019. FINDINGS: Evaluation of this exam is limited due to respiratory motion artifact. Cardiovascular: Top-normal cardiac size. No pericardial effusion. Advanced 3 vessel coronary vascular calcification. There is moderate atherosclerotic calcification of the thoracic aorta. No aneurysmal dilatation or dissection. The origins of the great vessels of the aortic arch appear patent as visualized. There is mild dilatation of the main pulmonary trunk suggestive of a degree of pulmonary hypertension. Clinical correlation is recommended. Evaluation for PE is limited due to respiratory motion artifact and suboptimal opacification and timing of the contrast. No definite pulmonary artery embolus identified. Mediastinum/Nodes: Mildly enlarged right hilar lymph nodes  measure up to approximately 2 cm. The esophagus is grossly unremarkable. No mediastinal fluid collection. Lungs/Pleura: There is background of emphysema. No focal consolidation, pleural effusion, or pneumothorax. The central airways are patent. Upper Abdomen: Fatty liver. Partially visualized right renal cystic lesions. A 2.7 cm air pocket in the left upper abdomen, likely within a gastric diverticula. Musculoskeletal: Degenerative changes of the spine with multilevel anterior osteophyte. No acute osseous pathology. Review of the MIP images confirms the above findings. IMPRESSION: 1. No acute intrathoracic pathology. No definite CT evidence of pulmonary artery embolus. 2. Mildly enlarged right hilar lymph nodes, nonspecific. 3. Mildly dilated main pulmonary trunk suggestive of a degree of pulmonary hypertension. 4. Fatty liver. 5. A 2.7 cm air pocket in the left upper abdomen, likely within a gastric diverticula. 6. Coronary vascular calcification and aortic Atherosclerosis (ICD10-I70.0) and Emphysema (ICD10-J43.9). Electronically Signed   By: AAnner CreteM.D.   On: 11/08/2019 21:47   DG Chest Portable 1 View  Result Date: 11/08/2019 CLINICAL DATA:  Shortness of breath since this morning. Hypoxia. EXAM: PORTABLE CHEST 1 VIEW COMPARISON:  None. FINDINGS: Normal sized heart. Prominent pulmonary vasculature and interstitial markings. The lungs are hyperexpanded. No visible pleural fluid. Thoracic spine degenerative changes. IMPRESSION: Mild changes of congestive heart failure superimposed on COPD. Electronically Signed   By: SClaudie ReveringM.D.   On: 11/08/2019 16:52   ECHOCARDIOGRAM COMPLETE  Result Date: 11/09/2019    ECHOCARDIOGRAM REPORT   Patient Name:   LLATAJAHRICHMOND Date of Exam: 11/09/2019 Medical Rec #:  [id number]     Height:       65.0 in Accession #:    [id number]    Weight:       255.5 lb Date of Birth:  [birth year]    BSA:          2.195 m Patient Age:    89years       BP:           113/61 mmHg  Patient Gender: F              HR:           78 bpm. Exam Location:  [facility] Procedure: 2D Echo, Cardiac Doppler and Color Doppler Indications:     CHF- acute diastolic 4237.62 History:         Patient has no prior history of Echocardiogram examinations.                  CHF, COPD; Risk Factors:Hypertension.  Sonographer:     JSherrie SportRDCS (AE) Referring Phys:  [email or web address] Phys: MKathlyn SacramentoMD  Sonographer Comments: Technically challenging study due to limited acoustic windows. IMPRESSIONS  1. Left ventricular ejection fraction, by estimation, is 60 to 65%. The left ventricle has normal function. The left ventricle has no regional wall motion abnormalities. There is mild left ventricular hypertrophy. Left ventricular diastolic parameters are consistent with Grade I diastolic dysfunction (impaired relaxation).  2. Right ventricular systolic function is normal. The right ventricular size is normal. Tricuspid regurgitation signal is inadequate for assessing PA pressure.  3. Left atrial size was mildly dilated.  4. The mitral valve is normal in structure. No evidence of mitral valve regurgitation. No evidence of mitral stenosis.  5. The aortic valve is normal in structure. Aortic valve regurgitation is not visualized. No aortic stenosis is present.  6. The inferior vena cava is normal in size with greater than 50% respiratory variability, suggesting right atrial pressure of 3 mmHg. FINDINGS  Left Ventricle: Left ventricular ejection fraction, by estimation, is 60 to 65%. The left ventricle has normal function. The left ventricle has no regional wall motion abnormalities. The left ventricular internal cavity size was normal in size. There is  mild left ventricular hypertrophy. Left ventricular diastolic parameters are consistent with Grade I diastolic dysfunction (impaired relaxation). Right Ventricle: The right ventricular size is normal. No increase in right ventricular wall thickness. Right  ventricular systolic function is normal. Tricuspid regurgitation signal is inadequate for assessing PA pressure. The tricuspid regurgitant velocity is 1.76 m/s, and with an assumed right atrial pressure of 10 mmHg, the estimated right ventricular systolic pressure is 66.2 mmHg. Left Atrium: Left atrial size was mildly dilated. Right Atrium: Right atrial size was normal in size. Pericardium: Trivial pericardial effusion is present. Mitral Valve: The mitral valve is normal in structure. Normal mobility of the mitral valve leaflets. Mild mitral annular calcification. No evidence of mitral valve regurgitation. No evidence of mitral valve stenosis. Tricuspid Valve: The tricuspid valve is normal in structure. Tricuspid valve regurgitation is not demonstrated. No evidence of tricuspid stenosis. Aortic Valve: The aortic valve is normal in structure. Aortic valve regurgitation is not visualized. No aortic stenosis is present. Aortic valve mean gradient measures 3.0 mmHg. Aortic valve peak gradient measures 6.2 mmHg. Aortic valve area, by VTI measures 2.24 cm. Pulmonic Valve: The pulmonic valve was normal in structure. Pulmonic valve regurgitation is not visualized. No evidence of pulmonic stenosis. Aorta: The aortic root is normal in size and structure. Venous: The inferior vena cava is normal in size with greater than 50% respiratory variability, suggesting right atrial pressure of 3 mmHg. IAS/Shunts: No atrial level shunt detected by color flow Doppler.  LEFT VENTRICLE PLAX 2D LVIDd:         4.57 cm  Diastology LVIDs:         2.77 cm  LV e' lateral:   11.00 cm/s LV PW:         1.44 cm  LV E/e' lateral: 7.7 LV IVS:        1.17 cm  LV e' medial:    5.44 cm/s LVOT diam:     2.10 cm  LV E/e' medial:  15.5 LV SV:         52 LV SV Index:   24 LVOT Area:     3.46 cm  RIGHT VENTRICLE RV S prime:     12.70 cm/s TAPSE (M-mode): 3.1 cm LEFT ATRIUM             Index       RIGHT ATRIUM           Index LA diam:  4.10 cm 1.87  cm/m  RA Area:     17.00 cm LA Vol (A2C):   71.4 ml 32.53 ml/m RA Volume:   44.90 ml  20.45 ml/m LA Vol (A4C):   49.0 ml 22.32 ml/m LA Biplane Vol: 63.1 ml 28.74 ml/m  AORTIC VALVE                   PULMONIC VALVE AV Area (Vmax):    2.16 cm    PV Vmax:        0.53 m/s AV Area (Vmean):   2.09 cm    PV Peak grad:   1.1 mmHg AV Area (VTI):     2.24 cm    RVOT Peak grad: 3 mmHg AV Vmax:           124.50 cm/s AV Vmean:          74.100 cm/s AV VTI:            0.232 m AV Peak Grad:      6.2 mmHg AV Mean Grad:      3.0 mmHg LVOT Vmax:         77.60 cm/s LVOT Vmean:        44.800 cm/s LVOT VTI:          0.150 m LVOT/AV VTI ratio: 0.65  AORTA Ao Root diam: 3.20 cm MITRAL VALVE                TRICUSPID VALVE MV Area (PHT): 3.27 cm     TR Peak grad:   12.4 mmHg MV Decel Time: 232 msec     TR Vmax:        176.00 cm/s MV E velocity: 84.40 cm/s MV A velocity: 118.00 cm/s  SHUNTS MV E/A ratio:  0.72         Systemic VTI:  0.15 m                             Systemic Diam: 2.10 cm Kathlyn Sacramento MD Electronically signed by Kathlyn Sacramento MD Signature Date/Time: 11/09/2019/3:48:51 PM    Final       Subjective: No complaints.  Feels better.  Discharge Exam: Vitals:   11/10/19 0743 11/10/19 1330  BP:    Pulse:    Resp:    Temp:    SpO2: 94% 94%   Vitals:   11/10/19 0500 11/10/19 0730 11/10/19 0743 11/10/19 1330  BP:  132/68    Pulse:  74    Resp:      Temp:  (!) 97.4 F (36.3 C)    TempSrc:  Oral    SpO2:  93% 94% 94%  Weight: 114.1 kg     Height:        General: Pt is alert, awake, not in acute distress Cardiovascular: RRR, S1/S2 +, no rubs, no gallops Respiratory: CTA bilaterally, no wheezing, no rhonchi Abdominal: Soft, NT, ND, bowel sounds + Extremities: no edema, no cyanosis excoriation in upper and lower extremities    The results of significant diagnostics from this hospitalization (including imaging, microbiology, ancillary and laboratory) are listed below for reference.      Microbiology: Recent Results (from the past 240 hour(s))  Respiratory Panel by RT PCR (Flu A&B, Covid) - Nasopharyngeal Swab     Status: None   Collection Time: 11/08/19  7:26 PM   Specimen: Nasopharyngeal Swab  Result Value Ref Range Status   SARS Coronavirus 2  by RT PCR NEGATIVE NEGATIVE Final    Comment: (NOTE) SARS-CoV-2 target nucleic acids are NOT DETECTED. The SARS-CoV-2 RNA is generally detectable in upper respiratoy specimens during the acute phase of infection. The lowest concentration of SARS-CoV-2 viral copies this assay can detect is 131 copies/mL. A negative result does not preclude SARS-Cov-2 infection and should not be used as the sole basis for treatment or other patient management decisions. A negative result may occur with  improper specimen collection/handling, submission of specimen other than nasopharyngeal swab, presence of viral mutation(s) within the areas targeted by this assay, and inadequate number of viral copies (<131 copies/mL). A negative result must be combined with clinical observations, patient history, and epidemiological information. The expected result is Negative. Fact Sheet for Patients:  PinkCheek.be Fact Sheet for Healthcare Providers:  GravelBags.it This test is not yet ap proved or cleared by the [place] FDA and  has been authorized for detection and/or diagnosis of SARS-CoV-2 by FDA under an Emergency Use Authorization (EUA). This EUA will remain  in effect (meaning this test can be used) for the duration of the COVID-19 declaration under Section 564(b)(1) of the Act, 21 U.S.C. section 360bbb-3(b)(1), unless the authorization is terminated or revoked sooner.    Influenza A by PCR NEGATIVE NEGATIVE Final   Influenza B by PCR NEGATIVE NEGATIVE Final    Comment: (NOTE) The Xpert Xpress SARS-CoV-2/FLU/RSV assay is intended as an aid in  the diagnosis of influenza from  Nasopharyngeal swab specimens and  should not be used as a sole basis for treatment. Nasal washings and  aspirates are unacceptable for Xpert Xpress SARS-CoV-2/FLU/RSV  testing. Fact Sheet for Patients: PinkCheek.be Fact Sheet for Healthcare Providers: GravelBags.it This test is not yet approved or cleared by the [place] FDA and  has been authorized for detection and/or diagnosis of SARS-CoV-2 by  FDA under an Emergency Use Authorization (EUA). This EUA will remain  in effect (meaning this test can be used) for the duration of the  Covid-19 declaration under Section 564(b)(1) of the Act, 21  U.S.C. section 360bbb-3(b)(1), unless the authorization is  terminated or revoked. Performed at [facility], [address] [place], [place] [address]   Respiratory Panel by PCR     Status: None   Collection Time: 11/08/19  7:26 PM   Specimen: Nasopharyngeal Swab; Respiratory  Result Value Ref Range Status   Adenovirus NOT DETECTED NOT DETECTED Final   Coronavirus 229E NOT DETECTED NOT DETECTED Final    Comment: (NOTE) The Coronavirus on the Respiratory Panel, DOES NOT test for the novel  Coronavirus (2019 nCoV)    Coronavirus HKU1 NOT DETECTED NOT DETECTED Final   Coronavirus NL63 NOT DETECTED NOT DETECTED Final   Coronavirus OC43 NOT DETECTED NOT DETECTED Final   Metapneumovirus NOT DETECTED NOT DETECTED Final   Rhinovirus / Enterovirus NOT DETECTED NOT DETECTED Final   Influenza A NOT DETECTED NOT DETECTED Final   Influenza B NOT DETECTED NOT DETECTED Final   Parainfluenza Virus 1 NOT DETECTED NOT DETECTED Final   Parainfluenza Virus 2 NOT DETECTED NOT DETECTED Final   Parainfluenza Virus 3 NOT DETECTED NOT DETECTED Final   Parainfluenza Virus 4 NOT DETECTED NOT DETECTED Final   Respiratory Syncytial Virus NOT DETECTED NOT DETECTED Final   Bordetella pertussis NOT DETECTED NOT DETECTED Final   Chlamydophila  pneumoniae NOT DETECTED NOT DETECTED Final   Mycoplasma pneumoniae NOT DETECTED NOT DETECTED Final    Comment: Performed at [facility], [address] [place], [place] [address]  MRSA PCR Screening     Status: None   Collection Time: 11/08/19 10:17 PM   Specimen: Nasal Mucosa; Nasopharyngeal  Result Value Ref Range Status   MRSA by PCR NEGATIVE NEGATIVE Final    Comment:        The GeneXpert MRSA Assay (FDA approved for NASAL specimens only), is one component of a comprehensive MRSA colonization surveillance program. It is not intended to diagnose MRSA infection nor to guide or monitor treatment for MRSA infections. Performed at [facility], [address] [place], [place] [address]      Labs: BNP (last 3 results) Recent Labs    11/08/19 1631  BNP 30.1   Basic Metabolic Panel: Recent Labs  Lab 11/08/19 1631 11/08/19 2248 11/09/19 0441 11/10/19 0504  NA 138  --  137 138  K 4.4  --  4.8 4.3  CL 100  --  101 94*  CO2 29  --  29 30  GLUCOSE 103*  --  156* 178*  BUN 15  --  18 36*  CREATININE 0.72  --  0.82 1.03*  CALCIUM 9.1  --  9.0 9.8  MG  --  2.3 2.3  --   PHOS  --   --  3.2  --    Liver Function Tests: Recent Labs  Lab 11/08/19 1631 11/09/19 0441 11/10/19 0504  AST 16 16 14*  ALT _0 ALKPHOS 99 87 83  BILITOT 0.9 0.8 0.8  PROT 7.6 7.1 6.5  ALBUMIN 3.8 3.4* 3.4*   No results for input(s): LIPASE, AMYLASE in the last 168 hours. No results for input(s): AMMONIA in the last 168 hours. CBC: Recent Labs  Lab 11/08/19 1631 11/08/19 2248 11/09/19 0441 11/10/19 0504  WBC 20.5* 18.6* 14.2* 18.6*  NEUTROABS 17.3* 18.1* 13.5* 16.9*  HGB 12.7 11.6* 11.9* 12.2  HCT 41.3 38.9 38.8 38.8  MCV 92.2 90.3 90.4 87.6  PLT 311 309 297 369   Cardiac Enzymes: No results for input(s): CKTOTAL, CKMB, CKMBINDEX, TROPONINI in the last 168 hours. BNP: Invalid input(s): POCBNP CBG: Recent Labs  Lab 11/08/19 2210  GLUCAP 176*    D-Dimer No results for input(s): DDIMER in the last 72 hours. Hgb A1c Recent Labs    11/09/19 0914  HGBA1C 6.0*   Lipid Profile Recent Labs    11/09/19 0914  CHOL 218*  HDL 49  LDLCALC 152*  TRIG 85  CHOLHDL 4.4   Thyroid function studies Recent Labs    11/09/19 0441  TSH 0.793   Anemia work up No results for input(s): VITAMINB12, FOLATE, FERRITIN, TIBC, IRON, RETICCTPCT in the last 72 hours. Urinalysis    Component Value Date/Time   COLORURINE YELLOW (A) 11/09/2019 0607   APPEARANCEUR CLEAR (A) 11/09/2019 0607   LABSPEC 1.032 (H) 11/09/2019 0607   PHURINE 5.0 11/09/2019 0607   GLUCOSEU NEGATIVE 11/09/2019 0607   HGBUR NEGATIVE 11/09/2019 0607   BILIRUBINUR NEGATIVE 11/09/2019 0607   KETONESUR NEGATIVE 11/09/2019 0607   PROTEINUR NEGATIVE 11/09/2019 0607   NITRITE NEGATIVE 11/09/2019 0607   LEUKOCYTESUR NEGATIVE 11/09/2019 0607   Sepsis Labs Invalid input(s): PROCALCITONIN,  WBC,  LACTICIDVEN Microbiology Recent Results (from the past 240 hour(s))  Respiratory Panel by RT PCR (Flu A&B, Covid) - Nasopharyngeal Swab     Status: None   Collection Time: 11/08/19  7:26 PM   Specimen: Nasopharyngeal Swab  Result Value Ref Range Status   SARS Coronavirus 2 by RT  PCR NEGATIVE NEGATIVE Final    Comment: (NOTE) SARS-CoV-2 target nucleic acids are NOT DETECTED. The SARS-CoV-2 RNA is generally detectable in upper respiratoy specimens during the acute phase of infection. The lowest concentration of SARS-CoV-2 viral copies this assay can detect is 131 copies/mL. A negative result does not preclude SARS-Cov-2 infection and should not be used as the sole basis for treatment or other patient management decisions. A negative result may occur with  improper specimen collection/handling, submission of specimen other than nasopharyngeal swab, presence of viral mutation(s) within the areas targeted by this assay, and inadequate number of viral copies (<131 copies/mL). A  negative result must be combined with clinical observations, patient history, and epidemiological information. The expected result is Negative. Fact Sheet for Patients:  PinkCheek.be Fact Sheet for Healthcare Providers:  GravelBags.it This test is not yet ap proved or cleared by the [place] FDA and  has been authorized for detection and/or diagnosis of SARS-CoV-2 by FDA under an Emergency Use Authorization (EUA). This EUA will remain  in effect (meaning this test can be used) for the duration of the COVID-19 declaration under Section 564(b)(1) of the Act, 21 U.S.C. section 360bbb-3(b)(1), unless the authorization is terminated or revoked sooner.    Influenza A by PCR NEGATIVE NEGATIVE Final   Influenza B by PCR NEGATIVE NEGATIVE Final    Comment: (NOTE) The Xpert Xpress SARS-CoV-2/FLU/RSV assay is intended as an aid in  the diagnosis of influenza from Nasopharyngeal swab specimens and  should not be used as a sole basis for treatment. Nasal washings and  aspirates are unacceptable for Xpert Xpress SARS-CoV-2/FLU/RSV  testing. Fact Sheet for Patients: PinkCheek.be Fact Sheet for Healthcare Providers: GravelBags.it This test is not yet approved or cleared by the [place] FDA and  has been authorized for detection and/or diagnosis of SARS-CoV-2 by  FDA under an Emergency Use Authorization (EUA). This EUA will remain  in effect (meaning this test can be used) for the duration of the  Covid-19 declaration under Section 564(b)(1) of the Act, 21  U.S.C. section 360bbb-3(b)(1), unless the authorization is  terminated or revoked. Performed at [facility], [address] [place], [place] [address]   Respiratory Panel by PCR     Status: None   Collection Time: 11/08/19  7:26 PM   Specimen: Nasopharyngeal Swab; Respiratory  Result Value Ref Range Status    Adenovirus NOT DETECTED NOT DETECTED Final   Coronavirus 229E NOT DETECTED NOT DETECTED Final    Comment: (NOTE) The Coronavirus on the Respiratory Panel, DOES NOT test for the novel  Coronavirus (2019 nCoV)    Coronavirus HKU1 NOT DETECTED NOT DETECTED Final   Coronavirus NL63 NOT DETECTED NOT DETECTED Final   Coronavirus OC43 NOT DETECTED NOT DETECTED Final   Metapneumovirus NOT DETECTED NOT DETECTED Final   Rhinovirus / Enterovirus NOT DETECTED NOT DETECTED Final   Influenza A NOT DETECTED NOT DETECTED Final   Influenza B NOT DETECTED NOT DETECTED Final   Parainfluenza Virus 1 NOT DETECTED NOT DETECTED Final   Parainfluenza Virus 2 NOT DETECTED NOT DETECTED Final   Parainfluenza Virus 3 NOT DETECTED NOT DETECTED Final   Parainfluenza Virus 4 NOT DETECTED NOT DETECTED Final   Respiratory Syncytial Virus NOT DETECTED NOT DETECTED Final   Bordetella pertussis NOT DETECTED NOT DETECTED Final   Chlamydophila pneumoniae NOT DETECTED NOT DETECTED Final   Mycoplasma pneumoniae NOT DETECTED NOT DETECTED Final    Comment: Performed at [facility], [place] [place]  [address] [place], [place] [address]  MRSA PCR Screening     Status: None   Collection Time: 11/08/19 10:17 PM   Specimen: Nasal Mucosa; Nasopharyngeal  Result Value Ref Range Status   MRSA by PCR NEGATIVE NEGATIVE Final    Comment:        The GeneXpert MRSA Assay (FDA approved for NASAL specimens only), is one component of a comprehensive MRSA colonization surveillance program. It is not intended to diagnose MRSA infection nor to guide or monitor treatment for MRSA infections. Performed at [facility], [address], [place], [place] [address]      Time coordinating discharge: Over 30 minutes  SIGNED:   Nolberto Hanlon, MD  [facility] 11/10/2019, 2:24 PM Pager   If 7PM-7AM, please contact night-coverage [email or web address] Password [email or web address]

## 2019-11-10 NOTE — TOC Transition Note (Signed)
Transition of Care [facility]) - CM/SW Discharge Note   Patient Details  Name: Paula Torres MRN: [id number] Date of Birth: 1951/05/24  Transition of Care [facility]) CM/SW Contact:  Su Hilt, RN Phone Number: 11/10/2019, 2:45 PM   Clinical Narrative:    Patient going to DC today with her husband, a NIV will be delivered to the home, [facility] set up for [facility] services, has a RW and oxygen at home Up to date with PCP and can afford medications No additional needs   Final next level of care: Home w Home Health Services Barriers to Discharge: Barriers Resolved   Patient Goals and CMS Choice Patient states their goals for this hospitalization and ongoing recovery are:: go home CMS Medicare.gov Compare Post Acute Care list provided to:: Patient Choice offered to / list presented to : Patient  Discharge Placement                       Discharge Plan and Services   Discharge Planning Services: CM Consult Post Acute Care Choice: Home Health          DME Arranged: NIV DME Agency: [employer] Date DME Agency Contacted: 11/10/19 Time DME Agency Contacted: [phone] Representative spoke with at DME Agency: [place]: RN, PT [facility] Agency: [place] ([place]) Date [place]: 11/10/19 Time [place]: [phone] Representative spoke with at [place]: [place] ([place]) Interventions     Readmission Risk Interventions No flowsheet data found.

## 2019-11-10 NOTE — TOC Progression Note (Signed)
Transition of Care [facility]) - Progression Note    Patient Details  Name: Paula Torres MRN: [id number] Date of Birth: [birth year]  Transition of Care [facility]) CM/SW [place], RN Phone Number: 11/10/2019, 10:43 AM  Clinical Narrative:    Received a call from Corene Cornea with [facility] they are accepting the patient for [facility] , I provided the patient with [facility] pamphlet and let her know they would contact her   Expected Discharge Plan: [place] Barriers to Discharge: Continued Medical Work up  Expected Discharge Plan and Services Expected Discharge Plan: [place]   Discharge Planning Services: CM Consult Post Acute Care Choice: [place] arrangements for the past 2 months: Single Family Home                 DME Arranged: NIV DME Agency: [employer] Date DME Agency Contacted: 11/10/19 Time DME Agency Contacted: [phone] Representative spoke with at DME Agency: [place] HH Arranged: RN, PT [facility] Agency: [place] ([place]) Date [place]: 11/10/19 Time [place]: [phone] Representative spoke with at [place]: [place] ([place]) Interventions    Readmission Risk Interventions No flowsheet data found.

## 2019-11-10 NOTE — Evaluation (Signed)
Occupational Therapy Evaluation Patient Details Name: Paula Torres MRN: [id number] DOB: Oct 11, 1950 Today's Date: 11/10/2019    History of Present Illness Pt is 69 y/o F admitted for COPD exacerbation with complaints of respiratory distress. HIstory includes COPD and CHF. Uptrending troponin, however no acute cardio symptoms, cleared to participate. At baseline, wears 3L of O2 day/night.   Clinical Impression   Pt was seen for OT evaluation this date. Prior to hospital admission, pt was Indep with most ADLs, reports occasional assist for higher endurance IADLs from her children. Pt lives with spouse in [facility] with 3 STE and used no AD for fxl mobility at baseline. Currently pt demonstrates impairments as described below (See OT problem list) which functionally limit her tolerance for ADL/self-care tasks. Pt with sats 90-92% sitting on 3Lnc, and desats to 87% with fxl mobility to/from restroom, still on 3Lnc, but recovers within ~30 seconds of sitting which pt reports seems close to her baseline at home. Pt currently requires extended time and supv for safety with ADLs and AE for LB dressing with MOD I (MIN A without AE).  Pt educated on use of AE, AD and DME for energy conservation and EC packet issued for carryover of education. Pt with good reception of education at this time and do not anticipate need for further OT services in acute setting. In addition, upon hospital discharge, do not anticipate any further OT needs. Anticipate pt could benefit from cardiopulmonary rehab to improve fxl activity tolerance.     Follow Up Recommendations  No OT follow up    Equipment Recommendations  None recommended by OT    Recommendations for Other Services       Precautions / Restrictions Precautions Precautions: Fall Restrictions Weight Bearing Restrictions: No      Mobility Bed Mobility               General bed mobility comments: pt up in chair  Transfers Overall transfer level: Needs  assistance Equipment used: None Transfers: Sit to/from Stand Sit to Stand: Supervision         General transfer comment: extended time for safety/tolerance    Balance Overall balance assessment: Mild deficits observed, not formally tested                                         ADL either performed or assessed with clinical judgement   ADL Overall ADL's : Needs assistance/impaired;At baseline Eating/Feeding: Independent   Grooming: Independent;Sitting   Upper Body Bathing: Set up;Sitting   Lower Body Bathing: Supervison/ safety;Sit to/from stand   Upper Body Dressing : Independent   Lower Body Dressing: Minimal assistance;Sit to/from stand Lower Body Dressing Details (indicate cue type and reason): MIN A with shoes, able to perform with MOD I w/ LH shoe horn Toilet Transfer: Supervision/safety;Ambulation;Grab bars   Toileting- Clothing Manipulation and Hygiene: Supervision/safety;Sit to/from stand       Functional mobility during ADLs: Supervision/safety(no AD)       Vision Baseline Vision/History: Wears glasses Wears Glasses: At all times Patient Visual Report: No change from baseline       Perception     Praxis      Pertinent Vitals/Pain Pain Assessment: No/denies pain     Hand Dominance     Extremity/Trunk Assessment Upper Extremity Assessment Upper Extremity Assessment: Overall WFL for tasks assessed   Lower Extremity Assessment Lower Extremity Assessment: Generalized weakness  Cervical / Trunk Assessment Cervical / Trunk Assessment: Normal   Communication Communication Communication: No difficulties   Cognition Arousal/Alertness: Awake/alert Behavior During Therapy: WFL for tasks assessed/performed Overall Cognitive Status: Within Functional Limits for tasks assessed                                     General Comments       Exercises Other Exercises Other Exercises: OT facilitaets education re: role  of OT inacute setting. Other Exercises: OT facilitates education re: EC techniques. handout provided. Pt with good reception.   Shoulder Instructions      Home Living Family/patient expects to be discharged to:: Private residence Living Arrangements: Spouse/significant other Available Help at Discharge: Family Type of Home: House Home Access: Stairs to enter Technical brewer of Steps: 3 Entrance Stairs-Rails: Can reach both [place]: One level         Bathroom Toilet: Handicapped height     [place]: Environmental consultant - 2 wheels          Prior Functioning/Environment Level of Independence: Independent        Comments: household ambulator, no AD. Reports she is generally sedentary and lets children "wait" on her.        OT Problem List: Decreased activity tolerance;Obesity;Cardiopulmonary status limiting activity      OT Treatment/Interventions:      OT Goals(Current goals can be found in the care plan section) Acute Rehab OT Goals Patient Stated Goal: to go home OT Goal Formulation: All assessment and education complete, DC therapy  OT Frequency:     Barriers to D/C:            Co-evaluation              AM-PAC OT "6 Clicks" Daily Activity     Outcome Measure Help from another person eating meals?: None Help from another person taking care of personal grooming?: None Help from another person toileting, which includes using toliet, bedpan, or urinal?: None Help from another person bathing (including washing, rinsing, drying)?: None Help from another person to put on and taking off regular upper body clothing?: None Help from another person to put on and taking off regular lower body clothing?: A Little(able to do I'ly w/ AE) 6 Click Score: 23   End of Session Equipment Utilized During Treatment: Gait belt Nurse Communication: Mobility status  Activity Tolerance: Patient tolerated treatment well Patient left: in chair;with call bell/phone  within reach;with chair alarm set  OT Visit Diagnosis: Unsteadiness on feet (R26.81)                Time: 1340-1409 OT Time Calculation (min): 29 min Charges:  OT General Charges $OT Visit: 1 Visit OT Evaluation $OT Eval Low Complexity: 1 Low OT Treatments $Self Care/Home Management : 8-22 mins  Gerrianne Scale, [place], OTR/L ascom [phone] 11/10/19, 2:30 PM

## 2019-11-10 NOTE — Progress Notes (Signed)
Progress Note  Patient Name: Paula Torres Date of Encounter: 11/10/2019  Primary Cardiologist: new to [facility] - consult by [place]  Subjective   Transferred out of the ICU overnight. Dyspnea and swelling much improved. No chest pain. Remains on supplemental oxygen at 1.5 L (baseline 2-3 L at home). Documented UOP of 1.8 L for the past 24 hours with a net - 2.1 L for the admission. Slight bump in renal function with a BUN/SCr trend of 18/0.82-->36/1.03. Potassium at goal. Echo demonstrated preserved LVSF with Gr1DD and no significant valvular abnormalities.   Inpatient Medications    Scheduled Meds: . azithromycin  500 mg Oral Daily  . bisoprolol  5 mg Oral Daily  . budesonide (PULMICORT) nebulizer solution  0.5 mg Nebulization BID  . chlorhexidine  15 mL Mouth Rinse BID  . Chlorhexidine Gluconate Cloth  6 each Topical Daily  . enoxaparin (LOVENOX) injection  40 mg Subcutaneous Q12H  . escitalopram  20 mg Oral Daily  . furosemide  40 mg Intravenous Q12H  . hydrocerin   Topical BID  . ipratropium-albuterol  3 mL Nebulization Q6H  . mouth rinse  15 mL Mouth Rinse q12n4p  . mometasone-formoterol  1 puff Inhalation BID  . polyethylene glycol  17 g Oral BID  . predniSONE  40 mg Oral Q breakfast  . rOPINIRole  2 mg Oral QHS  . sodium chloride flush  3 mL Intravenous Q12H  . sodium chloride flush  3 mL Intravenous Q12H  . spironolactone  25 mg Oral Daily   Continuous Infusions: . sodium chloride     PRN Meds: sodium chloride, acetaminophen **OR** acetaminophen, albuterol, bisacodyl, HYDROcodone-acetaminophen, ondansetron **OR** ondansetron (ZOFRAN) IV, sodium chloride flush   Vital Signs    Vitals:   11/10/19 0300 11/10/19 0401 11/10/19 0500 11/10/19 0730  BP: 129/61 139/62  132/68  Pulse: 78 76  74  Resp: (!) 24 18    Temp:  98 F (36.7 C)  (!) 97.4 F (36.3 C)  TempSrc:  Oral  Oral  SpO2: 97% 90%  93%  Weight:   114.1 kg   Height:        Intake/Output Summary (Last  24 hours) at 11/10/2019 0837 Last data filed at 11/10/2019 0340 Gross per 24 hour  Intake 300 ml  Output 2400 ml  Net -2100 ml   Filed Weights   11/08/19 2220 11/09/19 0500 11/10/19 0500  Weight: 115.9 kg 115.9 kg 114.1 kg    Telemetry    SR with rare PVCs - Personally Reviewed  ECG    No new tracings - Personally Reviewed  Physical Exam   GEN: No acute distress.   Neck: No JVD. Cardiac: RRR, no murmurs, rubs, or gallops.  Respiratory: Slightly diminished breath sounds along the bilateral bases.  GI: Soft, nontender, non-distended.   MS: Trace lower extremity edema; No deformity. Neuro:  Alert and oriented x 3; Nonfocal.  Psych: Normal affect.  Labs    Chemistry Recent Labs  Lab 11/08/19 1631 11/09/19 0441 11/10/19 0504  NA 138 137 138  K 4.4 4.8 4.3  CL 100 101 94*  CO2 _0 GLUCOSE 103* 156* 178*  BUN 15 18 36*  CREATININE 0.72 0.82 1.03*  CALCIUM 9.1 9.0 9.8  PROT 7.6 7.1 6.5  ALBUMIN 3.8 3.4* 3.4*  AST 16 16 14*  ALT _1 ALKPHOS 99 87 83  BILITOT 0.9 0.8 0.8  GFRNONAA >60 >60 56*  GFRAA >60 >60 >60  ANIONGAP _0 Hematology Recent Labs  Lab 11/08/19 2248 11/09/19 0441 11/10/19 0504  WBC 18.6* 14.2* 18.6*  RBC 4.31 4.29 4.43  HGB 11.6* 11.9* 12.2  HCT 38.9 38.8 38.8  MCV 90.3 90.4 87.6  MCH 26.9 27.7 27.5  MCHC 29.8* 30.7 31.4  RDW 16.1* 16.2* 16.7*  PLT 309 297 369    Cardiac EnzymesNo results for input(s): TROPONINI in the last 168 hours. No results for input(s): TROPIPOC in the last 168 hours.   BNP Recent Labs  Lab 11/08/19 1631  BNP 85.0     DDimer No results for input(s): DDIMER in the last 168 hours.   Radiology    DG Abd 1 View  Result Date: 11/08/2019 IMPRESSION: 1. Normal bowel gas pattern. 2. IV contrast being excreted from both kidneys. 3. Fibroid versus vascular calcifications in the pelvis. Electronically Signed   By: Genevie Ann M.D.   On: 11/08/2019 21:55   CT Angio Chest PE W and/or Wo  Contrast  Result Date: 11/08/2019 IMPRESSION: 1. No acute intrathoracic pathology. No definite CT evidence of pulmonary artery embolus. 2. Mildly enlarged right hilar lymph nodes, nonspecific. 3. Mildly dilated main pulmonary trunk suggestive of a degree of pulmonary hypertension. 4. Fatty liver. 5. A 2.7 cm air pocket in the left upper abdomen, likely within a gastric diverticula. 6. Coronary vascular calcification and aortic Atherosclerosis (ICD10-I70.0) and Emphysema (ICD10-J43.9). Electronically Signed   By: Anner Crete M.D.   On: 11/08/2019 21:47   DG Chest Portable 1 View  Result Date: 11/08/2019 IMPRESSION: Mild changes of congestive heart failure superimposed on COPD. Electronically Signed   By: Claudie Revering M.D.   On: 11/08/2019 16:52    Cardiac Studies   2D echo 11/09/2019: 1. Left ventricular ejection fraction, by estimation, is 60 to 65%. The  left ventricle has normal function. The left ventricle has no regional  wall motion abnormalities. There is mild left ventricular hypertrophy.  Left ventricular diastolic parameters  are consistent with Grade I diastolic dysfunction (impaired relaxation).  2. Right ventricular systolic function is normal. The right ventricular  size is normal. Tricuspid regurgitation signal is inadequate for assessing  PA pressure.  3. Left atrial size was mildly dilated.  4. The mitral valve is normal in structure. No evidence of mitral valve  regurgitation. No evidence of mitral stenosis.  5. The aortic valve is normal in structure. Aortic valve regurgitation is  not visualized. No aortic stenosis is present.  6. The inferior vena cava is normal in size with greater than 50%  respiratory variability, suggesting right atrial pressure of 3 mmHg.   Patient Profile     69 y.o. female with history of severe COPD, reported HFpEF, OSA not on CPAP who is being seen today for the evaluation of elevated troponin at the request of Dr. Kurtis Bushman.  Assessment  & Plan    1. Elevated troponin/coronary artery calcification: -Never with chest pain -Admitted with acute on chronic hypoxic respiratory failure secondary to AECOPD and acute on chronic HFpEF/pulmonary hypertension -Echo with preserved LVSF -High sensitivity troponin peaked at 555, likely secondary to supply demand ischemia in the setting of AECOPD and volume overload -Currently, no indication for heparin gtt -ASA -Consider outpatient noninvasive ischemic testing   2. Acute on chronic hypoxic respiratory failure: -BiPAP per primary service, wean as able -Multifactorial including AECOPD, HFpEF, pulmonary hypertension, OSA with possible OHS, and morbid obesity  -Oxygen management per primary service   3. Acute  on chronic HFpEF/pulmonary hypertension: -Volume status is much improved -Change IV Lasix to 40 mg daily -Bisoprolol  -Continue PTA spironolactone  -Stop digoxin -Echo as above -Daily weights -Strict I/O -Her pulmonary hypertension is likely in the setting of COPD and untreated OSA -Recommend she follow up with pulmonology as an outpatient for further evaluation and management, including CPAP usage   4. AECOPD: -Per IM -Enlarged lymph nodes noted, likely inflammatory, follow up with PCP   5. Atrial tachycardia: -No further ectopy -Asymptomatic -TSH normal -Potassium and magnesium at goal -Bisoprolol   6. HLD: -LDL 152 this admission -Add Lipitor 20 mg daily -Follow up fasting lipid panel and LFT in ~ 8 weeks   For questions or updates, please contact [place] Please consult [email or web address] for contact info under Cardiology/STEMI.    Signed, Christell Faith, PA-C [place] Pager: [phone] 11/10/2019, 8:37 AM

## 2019-11-10 NOTE — Consult Note (Addendum)
Pulmonary Medicine          Date: 11/10/2019,   MRN# [id number] Paula Torres 22-Dec-1950     AdmissionWeight: 116.6 kg                 CurrentWeight: 114.1 kg      CHIEF COMPLAINT:   Severe acute COPD exacerbation.   HISTORY OF PRESENT ILLNESS   Paula Torres is a 69 y.o. female with medical history significant of COPD,chronic diastolic CHF, OSA, Presented with feeling progressively short of breath worse since this morning.  EMS was called oxygen saturation on room air 87% unable to speak in complete sentences end-tidal CO2 was checked and was 60 initially EMS administered albuterol Atrovent and 125 mg of Solu-Medrol patient was placed on CPAP on arrival and switched to BiPAP once in the emergency department.  She has underlying COPD that has been progressive and been followed by pulmonology. Pt reports no fever no chest pain, she has not taken her medications yesterday bc she was feeling more sleepy During my evaluation patient is able to now speak in full sentences and is on 3 L/min nasal cannula which appears to be her home setting.  PAST MEDICAL HISTORY   Past Medical History:  Diagnosis Date  . CHF (congestive heart failure) ([place])   . COPD (chronic obstructive pulmonary disease) ([place])   . Hypertension      SURGICAL HISTORY   Past Surgical History:  Procedure Laterality Date  . NO PAST SURGERIES       FAMILY HISTORY   Family History  Problem Relation Age of Onset  . Hypertension Other      SOCIAL HISTORY   Social History   Tobacco Use  . Smoking status: Former Research scientist (life sciences)  . Smokeless tobacco: Never Used  Substance Use Topics  . Alcohol use: Not Currently  . Drug use: Never     MEDICATIONS    Home Medication:    Current Medication:  Current Facility-Administered Medications:  .  0.9 %  sodium chloride infusion, 250 mL, Intravenous, PRN, Doutova, Anastassia, MD .  acetaminophen (TYLENOL) tablet 650 mg, 650 mg, Oral, Q6H PRN **OR**  acetaminophen (TYLENOL) suppository 650 mg, 650 mg, Rectal, Q6H PRN, Doutova, Anastassia, MD .  albuterol (PROVENTIL) (2.5 MG/3ML) 0.083% nebulizer solution 2.5 mg, 2.5 mg, Nebulization, Q2H PRN, Doutova, Anastassia, MD .  atorvastatin (LIPITOR) tablet 20 mg, 20 mg, Oral, q1800, Dunn, Ryan M, PA-C .  [COMPLETED] azithromycin (ZITHROMAX) 500 mg in sodium chloride 0.9 % 250 mL IVPB, 500 mg, Intravenous, Q24H, Last Rate: 250 mL/hr at 11/08/19 2233, 500 mg at 11/08/19 2233 **FOLLOWED BY** azithromycin (ZITHROMAX) tablet 500 mg, 500 mg, Oral, Daily, Doutova, Anastassia, MD, 500 mg at 11/10/19 1006 .  bisacodyl (DULCOLAX) EC tablet 5 mg, 5 mg, Oral, Daily PRN, Doutova, Anastassia, MD .  bisoprolol (ZEBETA) tablet 5 mg, 5 mg, Oral, Daily, Doutova, Anastassia, MD, 5 mg at 11/10/19 1058 .  budesonide (PULMICORT) nebulizer solution 0.5 mg, 0.5 mg, Nebulization, BID, Kasa, Kurian, MD, 0.5 mg at 11/10/19 0743 .  chlorhexidine (PERIDEX) 0.12 % solution 15 mL, 15 mL, Mouth Rinse, BID, Doutova, Anastassia, MD, 15 mL at 11/10/19 1007 .  Chlorhexidine Gluconate Cloth 2 % PADS 6 each, 6 each, Topical, Daily, Doutova, Anastassia, MD, 6 each at 11/09/19 1256 .  enoxaparin (LOVENOX) injection 40 mg, 40 mg, Subcutaneous, Q12H, Doutova, Anastassia, MD, 40 mg at 11/10/19 1004 .  escitalopram (LEXAPRO) tablet 20 mg, 20 mg, Oral, Daily, Doutova, Anastassia,  MD, 20 mg at 11/10/19 1058 .  [START ON 11/11/2019] furosemide (LASIX) tablet 40 mg, 40 mg, Oral, Daily, Dunn, Ryan M, PA-C .  hydrocerin (EUCERIN) cream, , Topical, BID, Nolberto Hanlon, MD, Given at 11/10/19 1011 .  HYDROcodone-acetaminophen (NORCO/VICODIN) 5-325 MG per tablet 1-2 tablet, 1-2 tablet, Oral, Q4H PRN, Doutova, Anastassia, MD .  ipratropium-albuterol (DUONEB) 0.5-2.5 (3) MG/3ML nebulizer solution 3 mL, 3 mL, Nebulization, Q6H, Doutova, Anastassia, MD, 3 mL at 11/10/19 0743 .  MEDLINE mouth rinse, 15 mL, Mouth Rinse, q12n4p, Doutova, Anastassia, MD, 15 mL at  11/09/19 1257 .  mometasone-formoterol (DULERA) 200-5 MCG/ACT inhaler 1 puff, 1 puff, Inhalation, BID, Doutova, Anastassia, MD, 1 puff at 11/10/19 1010 .  ondansetron (ZOFRAN) tablet 4 mg, 4 mg, Oral, Q6H PRN **OR** ondansetron (ZOFRAN) injection 4 mg, 4 mg, Intravenous, Q6H PRN, Doutova, Anastassia, MD .  polyethylene glycol (MIRALAX / GLYCOLAX) packet 17 g, 17 g, Oral, BID, Doutova, Anastassia, MD .  [COMPLETED] methylPREDNISolone sodium succinate (SOLU-MEDROL) 40 mg/mL injection 40 mg, 40 mg, Intravenous, Q12H, 40 mg at 11/09/19 1651 **FOLLOWED BY** predniSONE (DELTASONE) tablet 40 mg, 40 mg, Oral, Q breakfast, Doutova, Anastassia, MD, 40 mg at 11/10/19 1006 .  rOPINIRole (REQUIP) tablet 2 mg, 2 mg, Oral, QHS, Doutova, Anastassia, MD, 2 mg at 11/09/19 2114 .  sodium chloride flush (NS) 0.9 % injection 3 mL, 3 mL, Intravenous, Q12H, Doutova, Anastassia, MD, 3 mL at 11/09/19 2120 .  sodium chloride flush (NS) 0.9 % injection 3 mL, 3 mL, Intravenous, Q12H, Doutova, Anastassia, MD, 3 mL at 11/10/19 1016 .  sodium chloride flush (NS) 0.9 % injection 3 mL, 3 mL, Intravenous, PRN, Doutova, Anastassia, MD .  spironolactone (ALDACTONE) tablet 25 mg, 25 mg, Oral, Daily, Doutova, Anastassia, MD, 25 mg at 11/10/19 1006    ALLERGIES   Codeine and Sulfa antibiotics     REVIEW OF SYSTEMS    Review of Systems:  Gen:  Denies  fever, sweats, chills weigh loss  HEENT: Denies blurred vision, double vision, ear pain, eye pain, hearing loss, nose bleeds, sore throat Cardiac:  No dizziness, chest pain or heaviness, chest tightness,edema Resp:   Mildly rhonchorous breath sounds Gi: Denies swallowing difficulty, stomach pain, nausea or vomiting, diarrhea, constipation, bowel incontinence Gu:  Denies bladder incontinence, burning urine Ext:   Denies Joint pain, stiffness or swelling Skin: Denies  skin rash, easy bruising or bleeding or hives Endoc:  Denies polyuria, polydipsia , polyphagia or weight  change Psych:   Denies depression, insomnia or hallucinations   Other:  All other systems negative   VS: BP 132/68 (BP Location: Left Arm)   Pulse 74   Temp (!) 97.4 F (36.3 C) (Oral)   Resp 18   Ht _0  (1.651 m)   Wt 114.1 kg   SpO2 94%   BMI 41.86 kg/m      PHYSICAL EXAM    GENERAL:NAD, no fevers, chills, no weakness no fatigue HEAD: Normocephalic, atraumatic.  EYES: Pupils equal, round, reactive to light. Extraocular muscles intact. No scleral icterus.  MOUTH: Moist mucosal membrane. Dentition intact. No abscess noted.  EAR, NOSE, THROAT: Clear without exudates. No external lesions.  NECK: Supple. No thyromegaly. No nodules. No JVD.  PULMONARY: Mildly rhonchorous breath sounds bilaterally CARDIOVASCULAR: S1 and S2. Regular rate and rhythm. No murmurs, rubs, or gallops. No edema. Pedal pulses 2+ bilaterally.  GASTROINTESTINAL: Soft, nontender, nondistended. No masses. Positive bowel sounds. No hepatosplenomegaly.  MUSCULOSKELETAL: No swelling, clubbing, or edema. Range of motion full  in all extremities.  NEUROLOGIC: Cranial nerves II through XII are intact. No gross focal neurological deficits. Sensation intact. Reflexes intact.  SKIN: No ulceration, lesions, rashes, or cyanosis. Skin warm and dry. Turgor intact.  PSYCHIATRIC: Mood, affect within normal limits. The patient is awake, alert and oriented x 3. Insight, judgment intact.       IMAGING    DG Abd 1 View  Result Date: 11/08/2019 CLINICAL DATA:  69 year old female with abdominal distension. EXAM: ABDOMEN - 1 VIEW COMPARISON:  CTA chest 2126 hours today. FINDINGS: Three supine views. Excreted IV contrast in both renal collecting systems. Non obstructed bowel gas pattern. 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 Small dystrophic appearing calcifications in the pelvis could be vascular or fibroid related. No acute osseous abnormality identified. IMPRESSION: 1. Normal bowel gas  pattern. 2. IV contrast being excreted from both kidneys. 3. Fibroid versus vascular calcifications in the pelvis. Electronically Signed   By: Genevie Ann M.D.   On: 11/08/2019 21:55   CT Angio Chest PE W and/or Wo Contrast  Result Date: 11/08/2019 CLINICAL DATA:  69 year old female with hypoxia. EXAM: CT ANGIOGRAPHY CHEST WITH CONTRAST TECHNIQUE: Multidetector CT imaging of the chest was performed using the standard protocol during bolus administration of intravenous contrast. Multiplanar CT image reconstructions and MIPs were obtained to evaluate the vascular anatomy. CONTRAST:  190m OMNIPAQUE IOHEXOL 350 MG/ML SOLN COMPARISON:  Chest radiograph dated 11/08/2019. FINDINGS: Evaluation of this exam is limited due to respiratory motion artifact. Cardiovascular: Top-normal cardiac size. No pericardial effusion. Advanced 3 vessel coronary vascular calcification. There is moderate atherosclerotic calcification of the thoracic aorta. No aneurysmal dilatation or dissection. The origins of the great vessels of the aortic arch appear patent as visualized. There is mild dilatation of the main pulmonary trunk suggestive of a degree of pulmonary hypertension. Clinical correlation is recommended. Evaluation for PE is limited due to respiratory motion artifact and suboptimal opacification and timing of the contrast. No definite pulmonary artery embolus identified. Mediastinum/Nodes: Mildly enlarged right hilar lymph nodes measure up to approximately 2 cm. The esophagus is grossly unremarkable. No mediastinal fluid collection. Lungs/Pleura: There is background of emphysema. No focal consolidation, pleural effusion, or pneumothorax. The central airways are patent. Upper Abdomen: Fatty liver. Partially visualized right renal cystic lesions. A 2.7 cm air pocket in the left upper abdomen, likely within a gastric diverticula. Musculoskeletal: Degenerative changes of the spine with multilevel anterior osteophyte. No acute osseous  pathology. Review of the MIP images confirms the above findings. IMPRESSION: 1. No acute intrathoracic pathology. No definite CT evidence of pulmonary artery embolus. 2. Mildly enlarged right hilar lymph nodes, nonspecific. 3. Mildly dilated main pulmonary trunk suggestive of a degree of pulmonary hypertension. 4. Fatty liver. 5. A 2.7 cm air pocket in the left upper abdomen, likely within a gastric diverticula. 6. Coronary vascular calcification and aortic Atherosclerosis (ICD10-I70.0) and Emphysema (ICD10-J43.9). Electronically Signed   By: AAnner CreteM.D.   On: 11/08/2019 21:47   DG Chest Portable 1 View  Result Date: 11/08/2019 CLINICAL DATA:  Shortness of breath since this morning. Hypoxia. EXAM: PORTABLE CHEST 1 VIEW COMPARISON:  None. FINDINGS: Normal sized heart. Prominent pulmonary vasculature and interstitial markings. The lungs are hyperexpanded. No visible pleural fluid. Thoracic spine degenerative changes. IMPRESSION: Mild changes of congestive heart failure superimposed on COPD. Electronically Signed   By: SClaudie ReveringM.D.   On: 11/08/2019 16:52   ECHOCARDIOGRAM COMPLETE  Result Date: 11/09/2019    ECHOCARDIOGRAM REPORT  Patient Name:   KELLAN BOEHLKE Date of Exam: 11/09/2019 Medical Rec #:  [id number]      Height:       65.0 in Accession #:    [id number]     Weight:       255.5 lb Date of Birth:  Aug 26, 1951     BSA:          2.195 m Patient Age:    21 years       BP:           113/61 mmHg Patient Gender: F              HR:           78 bpm. Exam Location:  [facility] Procedure: 2D Echo, Cardiac Doppler and Color Doppler Indications:     CHF- acute diastolic 419.62  History:         Patient has no prior history of Echocardiogram examinations.                  CHF, COPD; Risk Factors:Hypertension.  Sonographer:     Sherrie Sport RDCS (AE) Referring Phys:  [place] Diagnosing Phys: Kathlyn Sacramento MD  Sonographer Comments: Technically challenging study due to limited acoustic windows.  IMPRESSIONS  1. Left ventricular ejection fraction, by estimation, is 60 to 65%. The left ventricle has normal function. The left ventricle has no regional wall motion abnormalities. There is mild left ventricular hypertrophy. Left ventricular diastolic parameters are consistent with Grade I diastolic dysfunction (impaired relaxation).  2. Right ventricular systolic function is normal. The right ventricular size is normal. Tricuspid regurgitation signal is inadequate for assessing PA pressure.  3. Left atrial size was mildly dilated.  4. The mitral valve is normal in structure. No evidence of mitral valve regurgitation. No evidence of mitral stenosis.  5. The aortic valve is normal in structure. Aortic valve regurgitation is not visualized. No aortic stenosis is present.  6. The inferior vena cava is normal in size with greater than 50% respiratory variability, suggesting right atrial pressure of 3 mmHg. FINDINGS  Left Ventricle: Left ventricular ejection fraction, by estimation, is 60 to 65%. The left ventricle has normal function. The left ventricle has no regional wall motion abnormalities. The left ventricular internal cavity size was normal in size. There is  mild left ventricular hypertrophy. Left ventricular diastolic parameters are consistent with Grade I diastolic dysfunction (impaired relaxation). Right Ventricle: The right ventricular size is normal. No increase in right ventricular wall thickness. Right ventricular systolic function is normal. Tricuspid regurgitation signal is inadequate for assessing PA pressure. The tricuspid regurgitant velocity is 1.76 m/s, and with an assumed right atrial pressure of 10 mmHg, the estimated right ventricular systolic pressure is 22.9 mmHg. Left Atrium: Left atrial size was mildly dilated. Right Atrium: Right atrial size was normal in size. Pericardium: Trivial pericardial effusion is present. Mitral Valve: The mitral valve is normal in structure. Normal mobility of  the mitral valve leaflets. Mild mitral annular calcification. No evidence of mitral valve regurgitation. No evidence of mitral valve stenosis. Tricuspid Valve: The tricuspid valve is normal in structure. Tricuspid valve regurgitation is not demonstrated. No evidence of tricuspid stenosis. Aortic Valve: The aortic valve is normal in structure. Aortic valve regurgitation is not visualized. No aortic stenosis is present. Aortic valve mean gradient measures 3.0 mmHg. Aortic valve peak gradient measures 6.2 mmHg. Aortic valve area, by VTI measures 2.24 cm. Pulmonic Valve: The pulmonic valve was normal in structure.  Pulmonic valve regurgitation is not visualized. No evidence of pulmonic stenosis. Aorta: The aortic root is normal in size and structure. Venous: The inferior vena cava is normal in size with greater than 50% respiratory variability, suggesting right atrial pressure of 3 mmHg. IAS/Shunts: No atrial level shunt detected by color flow Doppler.  LEFT VENTRICLE PLAX 2D LVIDd:         4.57 cm  Diastology LVIDs:         2.77 cm  LV e' lateral:   11.00 cm/s LV PW:         1.44 cm  LV E/e' lateral: 7.7 LV IVS:        1.17 cm  LV e' medial:    5.44 cm/s LVOT diam:     2.10 cm  LV E/e' medial:  15.5 LV SV:         52 LV SV Index:   24 LVOT Area:     3.46 cm  RIGHT VENTRICLE RV S prime:     12.70 cm/s TAPSE (M-mode): 3.1 cm LEFT ATRIUM             Index       RIGHT ATRIUM           Index LA diam:        4.10 cm 1.87 cm/m  RA Area:     17.00 cm LA Vol (A2C):   71.4 ml 32.53 ml/m RA Volume:   44.90 ml  20.45 ml/m LA Vol (A4C):   49.0 ml 22.32 ml/m LA Biplane Vol: 63.1 ml 28.74 ml/m  AORTIC VALVE                   PULMONIC VALVE AV Area (Vmax):    2.16 cm    PV Vmax:        0.53 m/s AV Area (Vmean):   2.09 cm    PV Peak grad:   1.1 mmHg AV Area (VTI):     2.24 cm    RVOT Peak grad: 3 mmHg AV Vmax:           124.50 cm/s AV Vmean:          74.100 cm/s AV VTI:            0.232 m AV Peak Grad:      6.2 mmHg AV Mean  Grad:      3.0 mmHg LVOT Vmax:         77.60 cm/s LVOT Vmean:        44.800 cm/s LVOT VTI:          0.150 m LVOT/AV VTI ratio: 0.65  AORTA Ao Root diam: 3.20 cm MITRAL VALVE                TRICUSPID VALVE MV Area (PHT): 3.27 cm     TR Peak grad:   12.4 mmHg MV Decel Time: 232 msec     TR Vmax:        176.00 cm/s MV E velocity: 84.40 cm/s MV A velocity: 118.00 cm/s  SHUNTS MV E/A ratio:  0.72         Systemic VTI:  0.15 m                             Systemic Diam: 2.10 cm Kathlyn Sacramento MD Electronically signed by Kathlyn Sacramento MD Signature Date/Time: 11/09/2019/3:48:51 PM    Final  ASSESSMENT/PLAN   Acute on chronic hypoxemic respiratory failure    Due to acute severe COPD exacerbation    -Agree with Solu-Medrol transition to p.o. prednisone    -DuoNebs while inpatient   -Broncho-pulmonary hygiene with flutter valve and incentive spirometer  -Arterial blood gas for trilogy NIV qualification-discussed with [facility] today -Patient states that arterial blood gases painful and requests IV pain medication.  I have agreed to give 2 mg morphine and have discussed possible allergies patient has codeine allergy listed on MAR however she states that this was not a true allergy and she has taken p.o. narcotics without any adverse effects in the past. Has diuresed well with now over 2 L net negative   Heart failure with preserved EF acute on chronic    -Cardiology on case appreciate input    -Status post diuresis with Lasix 40   Demand ischemia -Cardiology on case appreciate input status post transthoracic echo  Thank you for allowing me to participate in the care of this patient.   Patient/Family are satisfied with care plan and all questions have been answered.  This document was prepared using Dragon voice recognition software and may include unintentional dictation errors.     Ottie Glazier, M.D.  Division of [place]

## 2019-11-10 NOTE — Progress Notes (Signed)
Pt for discharge home a/o. Pt on  02 3 l [place] and will go home on this.  Instructions discussed with pt re  meds diet activity  F/u and chf clinic.  Verbalizes understanding. dtr in room with pt and  Viewed d/c planning also.  Out via w/c at this time w/o c/o.

## 2019-11-10 NOTE — TOC Initial Note (Signed)
Transition of Care [facility]) - Initial/Assessment Note    Patient Details  Name: Paula Torres MRN: [id number] Date of Birth: 03-28-1951  [facility] CM/SW Contact:    Su Hilt, RN Phone Number: 11/10/2019, 9:16 AM  Clinical Narrative:                 Met with the patient in the room, She was provided a list for [facility] agencies and stated she does not have a preference.  I explained I would set her up with the CHF program thru a [facility] agency that is able to take her and her insurance.  She agrees.  I explained I would let her know the agency once I hear back if they can accept her.  She lives at home with her spouse and has oxygen at home provided by [facility], she uses 3 liters continuously, She stated her Pulmonologist is trying to get her set up for a Trilogy at home and she completed a PFT last Friday, she stated that she is a stage 4 out of 5 for COPD, I contacted Brad with [employer] and he is aware of the referral.   Awaiting to hear back about the [facility]   Expected Discharge Plan: [place] Barriers to Discharge: Continued Medical Work up   Patient Goals and CMS Choice Patient states their goals for this hospitalization and ongoing recovery are:: go home CMS Medicare.gov Compare Post Acute Care list provided to:: Patient Choice offered to / list presented to : Patient  Expected Discharge Plan and Services Expected Discharge Plan: [place]   Discharge Planning Services: CM Consult Post Acute Care Choice: [place] arrangements for the past 2 months: Single Family Home                 DME Arranged: NIV DME Agency: [facility] Date DME Agency Contacted: 11/10/19 Time DME Agency Contacted: [phone] Representative spoke with at DME Agency: [place] Arranged: RN, PT [place] Agency: [place] ([place]) Date [place]: 11/10/19 Time [place]: [phone] Representative spoke with at [place]: Corene Cornea  Prior  Living Arrangements/Services Living arrangements for the past 2 months: [place] with:: Spouse Patient language and need for interpreter reviewed:: Yes Do you feel safe going back to the place where you live?: Yes      Need for Family Participation in Patient Care: No (Comment) Care giver support system in place?: Yes (comment) Current home services: DME(RW, Oxygen) Criminal Activity/Legal Involvement Pertinent to Current Situation/Hospitalization: No - Comment as needed  Activities of Daily Living Home Assistive Devices/Equipment: None    Permission Sought/Granted   Permission granted to share information with : Yes, Verbal Permission Granted              Emotional Assessment Appearance:: Appears stated age Attitude/Demeanor/Rapport: Engaged Affect (typically observed): Appropriate Orientation: : Oriented to Situation, Oriented to  Time, Oriented to Place, Oriented to Self Alcohol / Substance Use: Not Applicable Psych Involvement: No (comment)  Admission diagnosis:  Abdominal distention [R14.0] COPD with acute exacerbation ([place]) [J44.1] Acute respiratory failure with hypoxia and hypercapnia ([place]) [J96.01, J96.02] Patient Active Problem List   Diagnosis Date Noted  . COPD with acute exacerbation ([place]) 11/08/2019  . Acute on chronic diastolic CHF (congestive heart failure) ([place]) 11/08/2019  . Acute respiratory failure with hypoxia and hypercapnia ([place]) 11/08/2019  . Morbid obesity with BMI of 50.0-59.9, adult ([place]) 11/08/2019  . Elevated troponin  11/08/2019   PCP:  Center, [place]:  No Pharmacies Listed    Social Determinants of Health (SDOH) Interventions    Readmission Risk Interventions No flowsheet data found.

## 2019-11-12 ENCOUNTER — Telehealth: Payer: Self-pay | Admitting: Family

## 2019-11-12 NOTE — Telephone Encounter (Signed)
Spoke with patient who said she is doing very well since she left the hospital and is not having any issues since home. She is following a heart healthy diet as best as she can and checking her weight daily at home with no problems with her medications. She confirmed her new patient CHF Clinic appt for 3/17.   Alyse Low, [place]

## 2019-11-17 ENCOUNTER — Telehealth: Payer: Self-pay | Admitting: Cardiovascular Disease

## 2019-11-17 NOTE — Telephone Encounter (Signed)
Mendel Ryder with [facility] calling  Would need verbal orders for skilled nursing 1 week 1 2 week 1 1 week 3  3 as needed Please call [phone]

## 2019-11-17 NOTE — Telephone Encounter (Signed)
Returned the call to [place] at [facility]. Advised her of Dr. Tyrell Antonio response.  Mendel Ryder verbalized understanding.

## 2019-11-17 NOTE — Telephone Encounter (Signed)
Message fwd to Dr. Fletcher Anon for approval.

## 2019-11-17 NOTE — Telephone Encounter (Signed)
That should be requested from her primary care physician.  I only met her once in the hospital.

## 2019-11-17 NOTE — Progress Notes (Signed)
Patient ID: Paula Torres, female    DOB: 1951-06-20, 69 y.o.   MRN: [id number]  HPI  Paula Torres is a 69 y/o female with a history of HTN, anxiety, COPD, previous tobacco use and minimal LVH.   Echo report from 11/09/19 reviewed and showed an EF of 60-65% along with minimal LVH.  Admitted 11/08/19 due to COPD and HF exacerbation. Cardiology, pulmonology and wound care consults obtained. Given solumedrol and CPAP/Bipap. Given antibiotics. Elevated troponin thought to be due to demand ischemia. Diuretics adjusted. Discharged after 2 days.   She presents today for her initial visit with a chief complaint of shortness of breath upon moderate exertion. She describes this as chronic in nature having been present for several years. She has no other symptoms and specifically denies any difficulty sleeping, dizziness, abdominal distention, palpitations, pedal edema, chest pain, cough, fatigue or weight gain.   She did walk to our office from the [facility] entrance and says that she would be able to breathe much better if she didn't have to wear a mask. Does wear oxygen at 3L around the clock.   Past Medical History:  Diagnosis Date  . CHF (congestive heart failure) ([place])   . COPD (chronic obstructive pulmonary disease) ([facility])   . Hypertension    Past Surgical History:  Procedure Laterality Date  . NO PAST SURGERIES     Family History  Problem Relation Age of Onset  . Hypertension Other    Social History   Tobacco Use  . Smoking status: Former Research scientist (life sciences)  . Smokeless tobacco: Never Used  Substance Use Topics  . Alcohol use: Not Currently   Allergies  Allergen Reactions  . Codeine     Back Headache  . Sulfa Antibiotics     Reported to pt from mother who is no longer living -  Details unknown   Prior to Admission medications   Medication Sig Start Date End Date Taking? Authorizing Provider  albuterol (VENTOLIN HFA) 108 (90 Base) MCG/ACT inhaler Inhale 2 puffs into the lungs every 6  (six) hours as needed for wheezing or shortness of breath.   Yes [provider]  aspirin EC 81 MG tablet Take 1 tablet (81 mg total) by mouth daily for 30 doses. 11/10/19 12/10/19 Yes Nolberto Hanlon, MD  atorvastatin (LIPITOR) 20 MG tablet Take 1 tablet (20 mg total) by mouth daily at 6 PM. 11/10/19  Yes Nolberto Hanlon, MD  bisoprolol (ZEBETA) 5 MG tablet Take 1 tablet (5 mg total) by mouth daily. 11/11/19  Yes Nolberto Hanlon, MD  budesonide-formoterol (SYMBICORT) 160-4.5 MCG/ACT inhaler Inhale 2 puffs into the lungs 2 (two) times daily.   Yes [provider]  escitalopram (LEXAPRO) 20 MG tablet Take 20 mg by mouth daily.   Yes [provider]  famotidine (PEPCID) 20 MG tablet Take 1 tablet (20 mg total) by mouth daily for 30 doses. 11/10/19 12/10/19 Yes Nolberto Hanlon, MD  furosemide (LASIX) 40 MG tablet Take 1 tablet (40 mg total) by mouth daily. 11/11/19  Yes Nolberto Hanlon, MD  Ipratropium-Albuterol (COMBIVENT RESPIMAT) 20-100 MCG/ACT AERS respimat Inhale 1 puff into the lungs every 6 (six) hours.   Yes [provider]  ipratropium-albuterol (DUONEB) 0.5-2.5 (3) MG/3ML SOLN Take 3 mLs by nebulization.   Yes [provider]  rOPINIRole (REQUIP XL) 2 MG 24 hr tablet Take 2 mg by mouth at bedtime.   Yes [provider]  spironolactone (ALDACTONE) 25 MG tablet Take 25 mg by mouth daily.  Yes [provider]  tiotropium (SPIRIVA) 18 MCG inhalation capsule Place 18 mcg into inhaler and inhale daily.   Yes [provider]     Review of Systems  Constitutional: Negative for appetite change and fatigue.  HENT: Negative for congestion, postnasal drip and sore throat.   Eyes: Negative.   Respiratory: Positive for shortness of breath (with moderate exertion). Negative for cough.   Cardiovascular: Negative for chest pain, palpitations and leg swelling.  Gastrointestinal: Negative for abdominal distention and abdominal pain.  Endocrine: Negative.    Genitourinary: Negative.   Musculoskeletal: Negative for back pain and neck pain.  Skin: Negative.   Allergic/Immunologic: Negative.   Neurological: Negative for dizziness and light-headedness.  Hematological: Negative for adenopathy. Does not bruise/bleed easily.  Psychiatric/Behavioral: Negative for dysphoric mood and sleep disturbance (sleeping on 1 pillow with HOB elevated). The patient is not nervous/anxious.    Vitals:   11/18/19 1048  BP: 122/62  Pulse: 70  Resp: 20  SpO2: 91%  Weight: 260 lb 2 oz (118 kg)  Height: _0  (1.651 m)   Wt Readings from Last 3 Encounters:  11/18/19 260 lb 2 oz (118 kg)  11/10/19 251 lb 8.7 oz (114.1 kg)   Lab Results  Component Value Date   CREATININE 1.03 (H) 11/10/2019   CREATININE 0.82 11/09/2019   CREATININE 0.72 11/08/2019     Physical Exam Vitals and nursing note reviewed.  HENT:     Head: Normocephalic and atraumatic.  Cardiovascular:     Rate and Rhythm: Normal rate and regular rhythm.  Pulmonary:     Effort: Pulmonary effort is normal. No respiratory distress.     Breath sounds: No wheezing or rales.  Abdominal:     General: There is no distension.     Palpations: Abdomen is soft.     Tenderness: There is no abdominal tenderness.  Musculoskeletal:        General: No tenderness.     Cervical back: Normal range of motion and neck supple.     Right lower leg: No edema.     Left lower leg: No edema.  Skin:    General: Skin is warm and dry.  Neurological:     General: No focal deficit present.     Mental Status: She is alert and oriented to person, place, and time.  Psychiatric:        Mood and Affect: Mood normal.        Behavior: Behavior normal.        Thought Content: Thought content normal.     Assessment & Plan:  1: Chronic heart failure with preserved ejection fraction- - NYHA class II - euvolemic today - weighing daily; reminded to call for an overnight weight gain of >2 pounds or a weekly weight gain  of >5 pounds - not adding salt and has been reading food labels for sodium content; encouraged her to closely follow a 2015m sodium diet and written dietary information was given to her about this - sees cardiology (Gilford Rile 11/23/19 - digoxin stopped although it remained on her medication list and it was removed today - BNP 11/08/19 was 85.0  2: COPD- - Gold stage 2 - saw pulmonology (Lanney Gins 09/30/19 - wearing oxygen at 3L around the clock - PFT's done 11/02/19  3: Anxiety- - saw PCP (Sabra Heck 11/02/19 - BMP 11/10/19 reviewed and showed sodium 138, potassium 4.3, creatinine 1.03 and GFR 56   Medication bottles were reviewed.   Due to stability of  HF, will not make a return appointment for patient at this time. Advised patient that she could call back at anytime to schedule another appointment and she was comfortable with this plan.

## 2019-11-18 ENCOUNTER — Ambulatory Visit: Payer: 59 | Admitting: Family

## 2019-11-18 ENCOUNTER — Encounter: Payer: Self-pay | Admitting: Family

## 2019-11-18 ENCOUNTER — Ambulatory Visit: Payer: PRIVATE HEALTH INSURANCE | Attending: Family | Admitting: Family

## 2019-11-18 ENCOUNTER — Other Ambulatory Visit: Payer: Self-pay

## 2019-11-18 VITALS — BP 122/62 | HR 70 | Resp 20 | Ht 65.0 in | Wt 260.1 lb

## 2019-11-18 DIAGNOSIS — Z8249 Family history of ischemic heart disease and other diseases of the circulatory system: Secondary | ICD-10-CM | POA: Insufficient documentation

## 2019-11-18 DIAGNOSIS — F419 Anxiety disorder, unspecified: Secondary | ICD-10-CM | POA: Diagnosis not present

## 2019-11-18 DIAGNOSIS — J449 Chronic obstructive pulmonary disease, unspecified: Secondary | ICD-10-CM | POA: Insufficient documentation

## 2019-11-18 DIAGNOSIS — Z79899 Other long term (current) drug therapy: Secondary | ICD-10-CM | POA: Diagnosis not present

## 2019-11-18 DIAGNOSIS — I110 Hypertensive heart disease with heart failure: Secondary | ICD-10-CM | POA: Diagnosis not present

## 2019-11-18 DIAGNOSIS — Z87891 Personal history of nicotine dependence: Secondary | ICD-10-CM | POA: Diagnosis not present

## 2019-11-18 DIAGNOSIS — I5032 Chronic diastolic (congestive) heart failure: Secondary | ICD-10-CM | POA: Diagnosis present

## 2019-11-18 DIAGNOSIS — Z7982 Long term (current) use of aspirin: Secondary | ICD-10-CM | POA: Insufficient documentation

## 2019-11-18 DIAGNOSIS — Z7951 Long term (current) use of inhaled steroids: Secondary | ICD-10-CM | POA: Insufficient documentation

## 2019-11-18 NOTE — Patient Instructions (Addendum)
Continue weighing daily and call for an overnight weight gain of > 2 pounds or a weekly weight gain of >5 pounds.   Call us in the future if you'd like to make another appointment

## 2019-11-23 ENCOUNTER — Encounter: Payer: Self-pay | Admitting: Family

## 2019-11-23 ENCOUNTER — Ambulatory Visit (INDEPENDENT_AMBULATORY_CARE_PROVIDER_SITE_OTHER): Payer: 59 | Admitting: Family

## 2019-11-23 ENCOUNTER — Other Ambulatory Visit: Payer: Self-pay

## 2019-11-23 VITALS — BP 110/62 | HR 73 | Ht 65.5 in | Wt 265.0 lb

## 2019-11-23 DIAGNOSIS — E782 Mixed hyperlipidemia: Secondary | ICD-10-CM | POA: Diagnosis not present

## 2019-11-23 DIAGNOSIS — I5032 Chronic diastolic (congestive) heart failure: Secondary | ICD-10-CM

## 2019-11-23 DIAGNOSIS — R0602 Shortness of breath: Secondary | ICD-10-CM

## 2019-11-23 DIAGNOSIS — J449 Chronic obstructive pulmonary disease, unspecified: Secondary | ICD-10-CM

## 2019-11-23 DIAGNOSIS — I248 Other forms of acute ischemic heart disease: Secondary | ICD-10-CM | POA: Diagnosis not present

## 2019-11-23 DIAGNOSIS — R778 Other specified abnormalities of plasma proteins: Secondary | ICD-10-CM

## 2019-11-23 MED ORDER — SPIRONOLACTONE 25 MG PO TABS: 25.0000 mg | ORAL_TABLET | Freq: Every day | ORAL | 0 refills | Status: DC

## 2019-11-23 MED ORDER — ATORVASTATIN CALCIUM 20 MG PO TABS: 20.0000 mg | ORAL_TABLET | Freq: Every day | ORAL | 0 refills | Status: DC

## 2019-11-23 MED ORDER — ATORVASTATIN CALCIUM 20 MG PO TABS: 20.0000 mg | ORAL_TABLET | Freq: Every day | ORAL | 0 refills | Status: AC

## 2019-11-23 MED ORDER — BISOPROLOL FUMARATE 5 MG PO TABS: 5.0000 mg | ORAL_TABLET | Freq: Every day | ORAL | 0 refills | Status: DC

## 2019-11-23 MED ORDER — FUROSEMIDE 40 MG PO TABS: 40.0000 mg | ORAL_TABLET | Freq: Every day | ORAL | 0 refills | Status: DC

## 2019-11-23 NOTE — Patient Instructions (Addendum)
Medication Instructions:  No changes *If you need a refill on your cardiac medications before your next appointment, please call your pharmacy*   Lab Work: 1- Please have labs on the day of stress testing.  If you have labs (blood work) drawn today and your tests are completely normal, you will receive your results only by: Marland Kitchen MyChart Message (if you have MyChart) OR . A paper copy in the mail If you have any lab test that is abnormal or we need to change your treatment, we will call you to review the results.   Testing/Procedures: 1- [place]  Your caregiver has ordered a Stress Test with nuclear imaging. The purpose of this test is to evaluate the blood supply to your heart muscle. This procedure is referred to as a "Non-Invasive Stress Test." This is because other than having an IV started in your vein, nothing is inserted or "invades" your body. Cardiac stress tests are done to find areas of poor blood flow to the heart by determining the extent of coronary artery disease (CAD). Some patients exercise on a treadmill, which naturally increases the blood flow to your heart, while others who are  unable to walk on a treadmill due to physical limitations have a pharmacologic/chemical stress agent called Lexiscan . This medicine will mimic walking on a treadmill by temporarily increasing your coronary blood flow.   Please note: these test may take anywhere between 2-4 hours to complete  PLEASE REPORT TO [facility] AT THE FIRST DESK WILL DIRECT YOU WHERE TO GO  Date of Procedure:_____________________________________  Arrival Time for Procedure:______________________________  Instructions regarding medication:    __x__:  Hold betablocker(s) night before procedure and morning of procedure (bisoprolol)  __x__:  Hold other medications as follows:_______hold lasix the morning of  _________________________________________________________________________________________  PLEASE NOTIFY THE OFFICE AT LEAST 24 HOURS IN ADVANCE IF YOU ARE UNABLE TO KEEP YOUR APPOINTMENT.  [phone] AND  PLEASE NOTIFY NUCLEAR MEDICINE AT [facility] AT LEAST 24 HOURS IN ADVANCE IF YOU ARE UNABLE TO KEEP YOUR APPOINTMENT. [phone]  How to prepare for your Myoview test:  1. Do not eat or drink after midnight 2. No caffeine for 24 hours prior to test 3. No smoking 24 hours prior to test. 4. Your medication may be taken with water.  If your doctor stopped a medication because of this test, do not take that medication. 5. Ladies, please do not wear dresses.  Skirts or pants are appropriate. Please wear a short sleeve shirt. 6. No perfume, cologne or lotion. 7. Wear comfortable walking shoes. No heels!        Follow-Up: At [facility] you and your health needs are our priority.  As part of our continuing mission to provide you with exceptional heart care, we have created designated Provider Care Teams.  These Care Teams include your primary Cardiologist (physician) and Advanced Practice Providers (APPs -  Physician Assistants and Nurse Practitioners) who all work together to provide you with the care you need, when you need it.  We recommend signing up for the patient portal called "MyChart".  Sign up information is provided on this After Visit Summary.  MyChart is used to connect with patients for Virtual Visits (Telemedicine).  Patients are able to view lab/test results, encounter notes, upcoming appointments, etc.  Non-urgent messages can be sent to your provider as well.   To learn more about what you can do with MyChart, go to [email or web address].    Your  next appointment:  2 months with Fletcher Anon or app

## 2019-11-23 NOTE — H&P (View-Only) (Signed)
Office Visit    Patient Name: Paula Torres Date of Encounter: 11/23/2019  Primary Care Provider:  [facility] [place] Primary Cardiologist:  Kathlyn Sacramento, MD Electrophysiologist:  None   Chief Complaint    Paula Torres is a 69 y.o. female with a hx of diastolic heart failure, COPD, OSA presents today for hospital follow up.   Past Medical History    Past Medical History:  Diagnosis Date  . CHF (congestive heart failure) ([place])   . COPD (chronic obstructive pulmonary disease) ([place])   . Hypertension    Past Surgical History:  Procedure Laterality Date  . NO PAST SURGERIES      Allergies  Allergies  Allergen Reactions  . Codeine     Back Headache  . Sulfa Antibiotics     Reported to pt from mother who is no longer living -  Details unknown    History of Present Illness    Paula Torres is a 69 y.o. female with a hx of diastolic heart failure, COPD, OSA last seen while hospitalized.  Admitted 11/08/19 - 11/10/[facility] to [facility] for COPD exacerbation and acute on chronic diastolic heart failure. Elevated troponin likely supply demand ischemia in setting of respiratory illness. Echo 11/09/19 with LVEF 60-65%, mild LVH, gr1DD, RV normal size and function, LA mildly dilated, no significant valvular abnormalities. She had atrial tachycardia while admitted with workup including normal TSH, K, Mag - bisoprolol was continued. Lipitor was started for LDL 152.   Reports feeling well since discharge. No shortness of breath at rest but does note continued dyspnea on exertion that is improving. Wearing her baseline 3L of oxygen at home. Reports no chest pain, pressure, tightness.   Reviewed her echocardiogram and the diagnosis of diastolic heart failure. Reviewed her cardiac medications and their use in diastolic heart failure. Reviewed Atorvastatin and goal for LDL <100.  She asks why she is taking Gabapentin. Was prescribed by an outside provider. Anticipate it may have been  prescribed for restless legs. Encouraged her to call her primary care physician and pulmonologist.  EKGs/Labs/Other Studies Reviewed:   The following studies were reviewed today: Echo 11/09/19  1. Left ventricular ejection fraction, by estimation, is 60 to 65%. The  left ventricle has normal function. The left ventricle has no regional  wall motion abnormalities. There is mild left ventricular hypertrophy.  Left ventricular diastolic parameters  are consistent with Grade I diastolic dysfunction (impaired relaxation).   2. Right ventricular systolic function is normal. The right ventricular  size is normal. Tricuspid regurgitation signal is inadequate for assessing  PA pressure.   3. Left atrial size was mildly dilated.   4. The mitral valve is normal in structure. No evidence of mitral valve  regurgitation. No evidence of mitral stenosis.   5. The aortic valve is normal in structure. Aortic valve regurgitation is  not visualized. No aortic stenosis is present.   6. The inferior vena cava is normal in size with greater than 50%  respiratory variability, suggesting right atrial pressure of 3 mmHg.   EKG:  EKG is ordered today.  The ekg ordered today demonstrates sinus rhythm 73 bpm with rightward axis and no acute ST/T wave changes.  Recent Labs: 11/08/2019: B Natriuretic Peptide 85.0 11/09/2019: Magnesium 2.3; TSH 0.793 11/10/2019: ALT 16; BUN 36; Creatinine, Ser 1.03; Hemoglobin 12.2; Platelets 369; Potassium 4.3; Sodium 138  Recent Lipid Panel    Component Value Date/Time   CHOL 218 (H) 11/09/2019 0914   TRIG 85  11/09/2019 0914   HDL 49 11/09/2019 0914   CHOLHDL 4.4 11/09/2019 0914   VLDL 17 11/09/2019 0914   LDLCALC 152 (H) 11/09/2019 0914    Home Medications   Current Meds  Medication Sig  . albuterol (VENTOLIN HFA) 108 (90 Base) MCG/ACT inhaler Inhale 2 puffs into the lungs every 6 (six) hours as needed for wheezing or shortness of breath.  Marland Kitchen aspirin EC 81 MG tablet Take 1  tablet (81 mg total) by mouth daily for 30 doses.  Marland Kitchen atorvastatin (LIPITOR) 20 MG tablet Take 1 tablet (20 mg total) by mouth daily at 6 PM.  . bisoprolol (ZEBETA) 5 MG tablet Take 1 tablet (5 mg total) by mouth daily.  Marland Kitchen escitalopram (LEXAPRO) 20 MG tablet Take 20 mg by mouth daily.  . famotidine (PEPCID) 20 MG tablet Take 1 tablet (20 mg total) by mouth daily for 30 doses.  . furosemide (LASIX) 40 MG tablet Take 1 tablet (40 mg total) by mouth daily.  Marland Kitchen gabapentin (NEURONTIN) 300 MG capsule Take 300 mg by mouth daily.  . Ipratropium-Albuterol (COMBIVENT RESPIMAT) 20-100 MCG/ACT AERS respimat Inhale 1 puff into the lungs every 6 (six) hours.  Marland Kitchen ipratropium-albuterol (DUONEB) 0.5-2.5 (3) MG/3ML SOLN Take 3 mLs by nebulization.  Marland Kitchen rOPINIRole (REQUIP XL) 2 MG 24 hr tablet Take 2 mg by mouth at bedtime.  Marland Kitchen spironolactone (ALDACTONE) 25 MG tablet Take 1 tablet (25 mg total) by mouth daily.  . TRELEGY ELLIPTA 100-62.5-25 MCG/INH AEPB Inhale 1 puff into the lungs daily.  . [DISCONTINUED] atorvastatin (LIPITOR) 20 MG tablet Take 1 tablet (20 mg total) by mouth daily at 6 PM.  . [DISCONTINUED] atorvastatin (LIPITOR) 20 MG tablet Take 1 tablet (20 mg total) by mouth daily at 6 PM.  . [DISCONTINUED] bisoprolol (ZEBETA) 5 MG tablet Take 1 tablet (5 mg total) by mouth daily.  . [DISCONTINUED] bisoprolol (ZEBETA) 5 MG tablet Take 1 tablet (5 mg total) by mouth daily.  . [DISCONTINUED] furosemide (LASIX) 40 MG tablet Take 1 tablet (40 mg total) by mouth daily.  . [DISCONTINUED] furosemide (LASIX) 40 MG tablet Take 1 tablet (40 mg total) by mouth daily.  . [DISCONTINUED] spironolactone (ALDACTONE) 25 MG tablet Take 25 mg by mouth daily.  . [DISCONTINUED] spironolactone (ALDACTONE) 25 MG tablet Take 1 tablet (25 mg total) by mouth daily.      Review of Systems      Review of Systems  Constitution: Negative for chills, fever and malaise/fatigue.  Cardiovascular: Positive for dyspnea on exertion. Negative  for chest pain, leg swelling, near-syncope, orthopnea, palpitations and syncope.  Respiratory: Negative for cough, shortness of breath and wheezing.   Gastrointestinal: Negative for nausea and vomiting.  Neurological: Negative for dizziness, light-headedness and weakness.   All other systems reviewed and are otherwise negative except as noted above.  Physical Exam    VS:  BP 110/62 (BP Location: Left Arm, Patient Position: Sitting, Cuff Size: Large)   Pulse 73   Ht 5' 5.5" (1.664 m)   Wt 265 lb (120.2 kg)   SpO2 95%   BMI 43.43 kg/m  , BMI Body mass index is 43.43 kg/m. GEN: Well nourished, overweight, well developed, in no acute distress. HEENT: normal. Neck: Supple, no JVD, carotid bruits, or masses. Cardiac: RRR, no murmurs, rubs, or gallops. No clubbing, cyanosis. 1+ bilateral LE edema.  Radials/PT 2+ and equal bilaterally.  Respiratory:  Respirations regular and unlabored, clear to auscultation bilaterally. GI: Soft, nontender, nondistended. MS: No deformity or atrophy.  Skin: Warm and dry, no rash. Venous stasis changes noted to bilateral LE. Neuro:  Strength and sensation are intact. Psych: Normal affect.  Assessment & Plan    1. Chronic diastolic heart failure/pulmonary hypertension - Recent admission with exacerbation of diastolic heart failure. Echo 11/2019 with normal LVEF and gr1DD. Reports her DOE is improving since discharge. DOE is likely multifactorial chronic diastolic heart failure, COPD, obesity - plan for ischemic evaluation, as below. Overall euvolemic with only mild 1+ edema to bilateral lower extremities likely dependent edema Continue Lasix 40 mg daily and Spironolactone 9m daily. Hesitant to add beta blocker in the setting of COPD and hypotension.   BMET in 2 weeks for monitoring of renal function and electrolytes.  Low sodium, heart healthy diet encouraged.   Elevating lower extremities, compression stockings recommended.   Daily weights encouraged.  Report weight gain of 3lb overnight or 5lb in 1 week.  2. Elevated troponin - Noted during recent hospitalization troponin I 76 > 338 > 555 > 407. Likely demand ischemia in the setting of acute COPD and diastolic heart failure exacerbation. Due to risk factors including age, HLD, former smoking - will plan for Lexiscan Myoview to rule out ischemia.   3. COPD/OSA - Follows with Dr. ALanney Ginsof pulmonology. Not presently using CPAP, but has been recommended.   4. Obesity - Weight loss via diet and exercise encouraged.   5. HLD - Atorvastatin initiated during recent hospitalization for LDL 153. Goal LDL <100. Plan to recheck lipid/liver panel at next office visit. If Lexiscan shows ischemia, will update LDL goal to <70.   Disposition: Follow up in 2 month(s) with Dr. AFletcher Anonor APP   CLoel Dubonnet NP 11/23/2019, 6:22 PM

## 2019-11-23 NOTE — Progress Notes (Signed)
  Office Visit    Patient Name: Paula Torres Date of Encounter: 11/23/2019  Primary Care Provider:  [facility] Primary Cardiologist:  Muhammad Arida, MD Electrophysiologist:  None   Chief Complaint    Paula Torres is a 69 y.o. female with a hx of diastolic heart failure, COPD, OSA presents today for hospital follow up.   Past Medical History    Past Medical History:  Diagnosis Date  . CHF (congestive heart failure) (HCC)   . COPD (chronic obstructive pulmonary disease) (HCC)   . Hypertension    Past Surgical History:  Procedure Laterality Date  . NO PAST SURGERIES      Allergies  Allergies  Allergen Reactions  . Codeine     Back Headache  . Sulfa Antibiotics     Reported to pt from mother who is no longer living -  Details unknown    History of Present Illness    Paula Torres is a 69 y.o. female with a hx of diastolic heart failure, COPD, OSA last seen while hospitalized.  Admitted 11/08/19 - 11/10/19 to [facility] for COPD exacerbation and acute on chronic diastolic heart failure. Elevated troponin likely supply demand ischemia in setting of respiratory illness. Echo 11/09/19 with LVEF 60-65%, mild LVH, gr1DD, RV normal size and function, LA mildly dilated, no significant valvular abnormalities. She had atrial tachycardia while admitted with workup including normal TSH, K, Mag - bisoprolol was continued. Lipitor was started for LDL 152.   Reports feeling well since discharge. No shortness of breath at rest but does note continued dyspnea on exertion that is improving. Wearing her baseline 3L of oxygen at home. Reports no chest pain, pressure, tightness.   Reviewed her echocardiogram and the diagnosis of diastolic heart failure. Reviewed her cardiac medications and their use in diastolic heart failure. Reviewed Atorvastatin and goal for LDL <100.  She asks why she is taking Gabapentin. Was prescribed by an outside provider. Anticipate it may have been  prescribed for restless legs. Encouraged her to call her primary care physician and pulmonologist.  EKGs/Labs/Other Studies Reviewed:   The following studies were reviewed today: Echo 11/09/19  1. Left ventricular ejection fraction, by estimation, is 60 to 65%. The  left ventricle has normal function. The left ventricle has no regional  wall motion abnormalities. There is mild left ventricular hypertrophy.  Left ventricular diastolic parameters  are consistent with Grade I diastolic dysfunction (impaired relaxation).   2. Right ventricular systolic function is normal. The right ventricular  size is normal. Tricuspid regurgitation signal is inadequate for assessing  PA pressure.   3. Left atrial size was mildly dilated.   4. The mitral valve is normal in structure. No evidence of mitral valve  regurgitation. No evidence of mitral stenosis.   5. The aortic valve is normal in structure. Aortic valve regurgitation is  not visualized. No aortic stenosis is present.   6. The inferior vena cava is normal in size with greater than 50%  respiratory variability, suggesting right atrial pressure of 3 mmHg.   EKG:  EKG is ordered today.  The ekg ordered today demonstrates sinus rhythm 73 bpm with rightward axis and no acute ST/T wave changes.  Recent Labs: 11/08/2019: B Natriuretic Peptide 85.0 11/09/2019: Magnesium 2.3; TSH 0.793 11/10/2019: ALT 16; BUN 36; Creatinine, Ser 1.03; Hemoglobin 12.2; Platelets 369; Potassium 4.3; Sodium 138  Recent Lipid Panel    Component Value Date/Time   CHOL 218 (H) 11/09/2019 0914   TRIG 85   11/09/2019 0914   HDL 49 11/09/2019 0914   CHOLHDL 4.4 11/09/2019 0914   VLDL 17 11/09/2019 0914   LDLCALC 152 (H) 11/09/2019 0914    Home Medications   Current Meds  Medication Sig  . albuterol (VENTOLIN HFA) 108 (90 Base) MCG/ACT inhaler Inhale 2 puffs into the lungs every 6 (six) hours as needed for wheezing or shortness of breath.  . aspirin EC 81 MG tablet Take 1  tablet (81 mg total) by mouth daily for 30 doses.  . atorvastatin (LIPITOR) 20 MG tablet Take 1 tablet (20 mg total) by mouth daily at 6 PM.  . bisoprolol (ZEBETA) 5 MG tablet Take 1 tablet (5 mg total) by mouth daily.  . escitalopram (LEXAPRO) 20 MG tablet Take 20 mg by mouth daily.  . famotidine (PEPCID) 20 MG tablet Take 1 tablet (20 mg total) by mouth daily for 30 doses.  . furosemide (LASIX) 40 MG tablet Take 1 tablet (40 mg total) by mouth daily.  . gabapentin (NEURONTIN) 300 MG capsule Take 300 mg by mouth daily.  . Ipratropium-Albuterol (COMBIVENT RESPIMAT) 20-100 MCG/ACT AERS respimat Inhale 1 puff into the lungs every 6 (six) hours.  . ipratropium-albuterol (DUONEB) 0.5-2.5 (3) MG/3ML SOLN Take 3 mLs by nebulization.  . rOPINIRole (REQUIP XL) 2 MG 24 hr tablet Take 2 mg by mouth at bedtime.  . spironolactone (ALDACTONE) 25 MG tablet Take 1 tablet (25 mg total) by mouth daily.  . TRELEGY ELLIPTA 100-62.5-25 MCG/INH AEPB Inhale 1 puff into the lungs daily.  . [DISCONTINUED] atorvastatin (LIPITOR) 20 MG tablet Take 1 tablet (20 mg total) by mouth daily at 6 PM.  . [DISCONTINUED] atorvastatin (LIPITOR) 20 MG tablet Take 1 tablet (20 mg total) by mouth daily at 6 PM.  . [DISCONTINUED] bisoprolol (ZEBETA) 5 MG tablet Take 1 tablet (5 mg total) by mouth daily.  . [DISCONTINUED] bisoprolol (ZEBETA) 5 MG tablet Take 1 tablet (5 mg total) by mouth daily.  . [DISCONTINUED] furosemide (LASIX) 40 MG tablet Take 1 tablet (40 mg total) by mouth daily.  . [DISCONTINUED] furosemide (LASIX) 40 MG tablet Take 1 tablet (40 mg total) by mouth daily.  . [DISCONTINUED] spironolactone (ALDACTONE) 25 MG tablet Take 25 mg by mouth daily.  . [DISCONTINUED] spironolactone (ALDACTONE) 25 MG tablet Take 1 tablet (25 mg total) by mouth daily.      Review of Systems      Review of Systems  Constitution: Negative for chills, fever and malaise/fatigue.  Cardiovascular: Positive for dyspnea on exertion. Negative  for chest pain, leg swelling, near-syncope, orthopnea, palpitations and syncope.  Respiratory: Negative for cough, shortness of breath and wheezing.   Gastrointestinal: Negative for nausea and vomiting.  Neurological: Negative for dizziness, light-headedness and weakness.   All other systems reviewed and are otherwise negative except as noted above.  Physical Exam    VS:  BP 110/62 (BP Location: Left Arm, Patient Position: Sitting, Cuff Size: Large)   Pulse 73   Ht 5' 5.5" (1.664 m)   Wt 265 lb (120.2 kg)   SpO2 95%   BMI 43.43 kg/m  , BMI Body mass index is 43.43 kg/m. GEN: Well nourished, overweight, well developed, in no acute distress. HEENT: normal. Neck: Supple, no JVD, carotid bruits, or masses. Cardiac: RRR, no murmurs, rubs, or gallops. No clubbing, cyanosis. 1+ bilateral LE edema.  Radials/PT 2+ and equal bilaterally.  Respiratory:  Respirations regular and unlabored, clear to auscultation bilaterally. GI: Soft, nontender, nondistended. MS: No deformity or atrophy.   Skin: Warm and dry, no rash. Venous stasis changes noted to bilateral LE. Neuro:  Strength and sensation are intact. Psych: Normal affect.  Assessment & Plan    1. Chronic diastolic heart failure/pulmonary hypertension - Recent admission with exacerbation of diastolic heart failure. Echo 11/2019 with normal LVEF and gr1DD. Reports her DOE is improving since discharge. DOE is likely multifactorial chronic diastolic heart failure, COPD, obesity - plan for ischemic evaluation, as below. Overall euvolemic with only mild 1+ edema to bilateral lower extremities likely dependent edema Continue Lasix 40 mg daily and Spironolactone 9m daily. Hesitant to add beta blocker in the setting of COPD and hypotension.   BMET in 2 weeks for monitoring of renal function and electrolytes.  Low sodium, heart healthy diet encouraged.   Elevating lower extremities, compression stockings recommended.   Daily weights encouraged.  Report weight gain of 3lb overnight or 5lb in 1 week.  2. Elevated troponin - Noted during recent hospitalization troponin I 76 > 338 > 555 > 407. Likely demand ischemia in the setting of acute COPD and diastolic heart failure exacerbation. Due to risk factors including age, HLD, former smoking - will plan for Lexiscan Myoview to rule out ischemia.   3. COPD/OSA - Follows with Dr. ALanney Ginsof pulmonology. Not presently using CPAP, but has been recommended.   4. Obesity - Weight loss via diet and exercise encouraged.   5. HLD - Atorvastatin initiated during recent hospitalization for LDL 153. Goal LDL <100. Plan to recheck lipid/liver panel at next office visit. If Lexiscan shows ischemia, will update LDL goal to <70.   Disposition: Follow up in 2 month(s) with Dr. AFletcher Anonor APP   CLoel Dubonnet NP 11/23/2019, 6:22 PM

## 2019-12-08 ENCOUNTER — Other Ambulatory Visit: Payer: Self-pay

## 2019-12-08 ENCOUNTER — Ambulatory Visit
Admission: RE | Admit: 2019-12-08 | Discharge: 2019-12-08 | Disposition: A | Discharge status: Home / Self Care | Payer: PRIVATE HEALTH INSURANCE | Source: Ambulatory Visit | Attending: Family | Admitting: Family

## 2019-12-08 DIAGNOSIS — R0602 Shortness of breath: Secondary | ICD-10-CM | POA: Diagnosis present

## 2019-12-08 LAB — NM MYOCAR MULTI W/SPECT W/WALL MOTION / EF
LV dias vol: 97 mL (ref 46–106)
LV sys vol: 35 mL
Peak HR: 89 {beats}/min
Rest HR: 73 {beats}/min
TID: 0.9

## 2019-12-08 MED ORDER — TECHNETIUM TC 99M TETROFOSMIN IV KIT
10.0000 | PACK | Freq: Once | INTRAVENOUS | Status: AC | PRN
  Administered 2019-12-08: 10:00:00 10.242 via INTRAVENOUS

## 2019-12-08 MED ORDER — TECHNETIUM TC 99M TETROFOSMIN IV KIT
30.0000 | PACK | Freq: Once | INTRAVENOUS | Status: AC | PRN
  Administered 2019-12-08: 11:00:00 29.284 via INTRAVENOUS

## 2019-12-08 MED ORDER — REGADENOSON 0.4 MG/5ML IV SOLN
0.4000 mg | Freq: Once | INTRAVENOUS | Status: AC
  Administered 2019-12-08: 11:00:00 0.4 mg via INTRAVENOUS

## 2019-12-09 ENCOUNTER — Telehealth: Payer: Self-pay | Admitting: Family

## 2019-12-09 DIAGNOSIS — R9439 Abnormal result of other cardiovascular function study: Secondary | ICD-10-CM

## 2019-12-09 NOTE — Addendum Note (Signed)
Addended by: Lamar Laundry on: 12/09/2019 03:13 PM   Modules accepted: Orders

## 2019-12-09 NOTE — Telephone Encounter (Signed)
Patient calling for stress test results.  

## 2019-12-09 NOTE — Telephone Encounter (Signed)
Called and spoke with [name].   Reviewed stress test result showing moderate size, moderate severity, nearly completely reversible defect involving mid and apical lateral walls as well as apex. Discussed concern for ischemic and recommendation for cardiac catheterization. Cardiac catheterization reviewed with her in detail. The patient understands that risks include but are not limited to stroke (1 in 1000), death (1 in 58), kidney failure [usually temporary] (1 in 500), bleeding (1 in 200), allergic reaction [possibly serious] (1 in 200), and agrees to proceed.   As [facility] cath lab is closed Monday 12/14/19 discussed recommendation to proceed at [facility] with Dr. Fletcher Anon. She declines and wishes to wait until 12/21/19 to have her procedure done at [facility].   Will forward to triage for assistance in scheduling.   Loel Dubonnet, NP  --- [facility] Adam Myoview 12/08/19  Abnormal pharmacologic myocardial perfusion stress test.  There is a moderate in size, moderate in severity, nearly completely reversible defect involving the mid and apical lateral wall as well as the apex, most consistent with ischemia but cannot rule out an element of artifact.  The left ventricular ejection fraction is normal (59%).  Attenuation correction CT shows coronary artery calcification and aortic atherosclerosis.  This is an intermediate risk study.  Sensitivity and specificity of the this study are degraded by body habitus.

## 2019-12-09 NOTE — Telephone Encounter (Addendum)
Patient cardiac cath scheduled for 12/21/19 @ 9:30am at [facility] with Dr. Fletcher Anon. Patient is to arrive at the medical mall @ 8:30am. COVID test scheduled for 12/17/19 between 8am-1pm. Patient will have her bmet and cbc drawn at the medical mall on 12/17/19. Advised the patient that she is allowed one support person. Advised the patient to plan for an overnight stay.  Patient verbalized understanding to the verbal pre-procedure instructions given. Written instructions mailed to the patient. Patient has no questions or concerns at this time. Patient advised to contact the office if any develop.   [facility] Cardiac Cath Instructions   You are scheduled for a Cardiac Cath on: 12/21/19 @ 9:30am  Please arrive at 8:30 am on the day of your procedure  Please expect a call from our [facility] to pre-register you  Do not eat/drink anything after midnight  Someone will need to drive you home  It is recommended someone be with you for the first 24 hours after your procedure  Wear clothes that are easy to get on/off and wear slip on shoes if possible   Medications bring a current list of all medications with you   _X_ Do not take these medications the morning of  your procedure: Lasix and Spironolactone     You may take all of your other medications the morning of your procedure with enough water to swallow safely   Day of your procedure: Arrive at the [place] entrance.  Free valet service is available.  After entering the [place] please check-in at the registration desk (1st desk on your right) to receive your armband. After receiving your armband someone will escort you to the cardiac cath/special procedures waiting area.  The usual length of stay after your procedure is about 2 to 3 hours.  This can vary.  If you have any questions, please call our office at [phone], or you may call the cardiac cath lab at [facility] directly at [phone]

## 2019-12-10 ENCOUNTER — Encounter: Payer: Self-pay | Admitting: Family

## 2019-12-17 ENCOUNTER — Other Ambulatory Visit
Admission: RE | Admit: 2019-12-17 | Discharge: 2019-12-17 | Disposition: A | Discharge status: Home / Self Care | Payer: PRIVATE HEALTH INSURANCE | Source: Ambulatory Visit | Attending: Family | Admitting: Family

## 2019-12-17 ENCOUNTER — Other Ambulatory Visit: Payer: Self-pay

## 2019-12-17 DIAGNOSIS — Z01812 Encounter for preprocedural laboratory examination: Secondary | ICD-10-CM | POA: Insufficient documentation

## 2019-12-17 DIAGNOSIS — Z20822 Contact with and (suspected) exposure to covid-19: Secondary | ICD-10-CM | POA: Insufficient documentation

## 2019-12-17 LAB — SARS CORONAVIRUS 2 (TAT 6-24 HRS): SARS Coronavirus 2: NEGATIVE

## 2019-12-21 ENCOUNTER — Encounter
Admission: RE | Disposition: A | Discharge status: Home / Self Care | Payer: PRIVATE HEALTH INSURANCE | Source: Home / Self Care | Attending: Cardiovascular Disease

## 2019-12-21 ENCOUNTER — Other Ambulatory Visit: Payer: Self-pay

## 2019-12-21 ENCOUNTER — Ambulatory Visit
Admission: RE | Admit: 2019-12-21 | Discharge: 2019-12-21 | Disposition: A | Discharge status: Home / Self Care | Payer: PRIVATE HEALTH INSURANCE | Attending: Cardiovascular Disease | Admitting: Cardiovascular Disease

## 2019-12-21 ENCOUNTER — Encounter: Payer: Self-pay | Admitting: Cardiovascular Disease

## 2019-12-21 DIAGNOSIS — J449 Chronic obstructive pulmonary disease, unspecified: Secondary | ICD-10-CM | POA: Insufficient documentation

## 2019-12-21 DIAGNOSIS — R7989 Other specified abnormal findings of blood chemistry: Secondary | ICD-10-CM | POA: Diagnosis not present

## 2019-12-21 DIAGNOSIS — Z6841 Body Mass Index (BMI) 40.0 and over, adult: Secondary | ICD-10-CM | POA: Insufficient documentation

## 2019-12-21 DIAGNOSIS — G4733 Obstructive sleep apnea (adult) (pediatric): Secondary | ICD-10-CM | POA: Diagnosis not present

## 2019-12-21 DIAGNOSIS — Z885 Allergy status to narcotic agent status: Secondary | ICD-10-CM | POA: Insufficient documentation

## 2019-12-21 DIAGNOSIS — I2510 Atherosclerotic heart disease of native coronary artery without angina pectoris: Secondary | ICD-10-CM | POA: Insufficient documentation

## 2019-12-21 DIAGNOSIS — I110 Hypertensive heart disease with heart failure: Secondary | ICD-10-CM | POA: Insufficient documentation

## 2019-12-21 DIAGNOSIS — Z7982 Long term (current) use of aspirin: Secondary | ICD-10-CM | POA: Insufficient documentation

## 2019-12-21 DIAGNOSIS — R0609 Other forms of dyspnea: Secondary | ICD-10-CM | POA: Insufficient documentation

## 2019-12-21 DIAGNOSIS — I272 Pulmonary hypertension, unspecified: Secondary | ICD-10-CM | POA: Diagnosis not present

## 2019-12-21 DIAGNOSIS — R9439 Abnormal result of other cardiovascular function study: Secondary | ICD-10-CM | POA: Diagnosis present

## 2019-12-21 DIAGNOSIS — Z79899 Other long term (current) drug therapy: Secondary | ICD-10-CM | POA: Insufficient documentation

## 2019-12-21 DIAGNOSIS — Z882 Allergy status to sulfonamides status: Secondary | ICD-10-CM | POA: Insufficient documentation

## 2019-12-21 DIAGNOSIS — I5032 Chronic diastolic (congestive) heart failure: Secondary | ICD-10-CM | POA: Diagnosis not present

## 2019-12-21 DIAGNOSIS — I5033 Acute on chronic diastolic (congestive) heart failure: Secondary | ICD-10-CM

## 2019-12-21 DIAGNOSIS — E669 Obesity, unspecified: Secondary | ICD-10-CM | POA: Insufficient documentation

## 2019-12-21 DIAGNOSIS — E785 Hyperlipidemia, unspecified: Secondary | ICD-10-CM | POA: Diagnosis not present

## 2019-12-21 DIAGNOSIS — I248 Other forms of acute ischemic heart disease: Secondary | ICD-10-CM

## 2019-12-21 HISTORY — PX: RIGHT/LEFT HEART CATH AND CORONARY ANGIOGRAPHY: CATH118266

## 2019-12-21 LAB — COMPREHENSIVE METABOLIC PANEL
ALT: 9 U/L (ref 0–44)
AST: 12 U/L — ABNORMAL LOW (ref 15–41)
Albumin: 3.8 g/dL (ref 3.5–5.0)
Alkaline Phosphatase: 83 U/L (ref 38–126)
Anion gap: 11 (ref 5–15)
BUN: 15 mg/dL (ref 8–23)
CO2: 30 mmol/L (ref 22–32)
Calcium: 9.1 mg/dL (ref 8.9–10.3)
Chloride: 99 mmol/L (ref 98–111)
Creatinine, Ser: 0.93 mg/dL (ref 0.44–1.00)
GFR calc Af Amer: >60 mL/min (ref >60)
GFR calc non Af Amer: >60 mL/min (ref >60)
Glucose, Bld: 106 mg/dL — ABNORMAL HIGH (ref 70–99)
Potassium: 3.9 mmol/L (ref 3.5–5.1)
Sodium: 140 mmol/L (ref 135–145)
Total Bilirubin: 0.6 mg/dL (ref 0.3–1.2)
Total Protein: 7 g/dL (ref 6.5–8.1)

## 2019-12-21 LAB — CBC
HCT: 37.3 % (ref 36.0–46.0)
Hemoglobin: 11.2 g/dL — ABNORMAL LOW (ref 12.0–15.0)
MCH: 27.5 pg (ref 26.0–34.0)
MCHC: 30 g/dL (ref 30.0–36.0)
MCV: 91.6 fL (ref 80.0–100.0)
Platelets: 309 10*3/uL (ref 150–400)
RBC: 4.07 MIL/uL (ref 3.87–5.11)
RDW: 15.7 % — ABNORMAL HIGH (ref 11.5–15.5)
WBC: 11.9 10*3/uL — ABNORMAL HIGH (ref 4.0–10.5)
nRBC: 0 % (ref 0.0–0.2)

## 2019-12-21 SURGERY — RIGHT/LEFT HEART CATH AND CORONARY ANGIOGRAPHY
Anesthesia: Moderate Sedation | Laterality: Left

## 2019-12-21 MED ORDER — SODIUM CHLORIDE 0.9% FLUSH: 3.0000 mL | INTRAVENOUS | Status: DC | PRN

## 2019-12-21 MED ORDER — SODIUM CHLORIDE 0.9 % WEIGHT BASED INFUSION: 1.0000 mL/kg/h | INTRAVENOUS | Status: DC

## 2019-12-21 MED ORDER — HEPARIN (PORCINE) IN NACL 1000-0.9 UT/500ML-% IV SOLN
INTRAVENOUS | Status: DC | PRN
  Administered 2019-12-21: 500 mL

## 2019-12-21 MED ORDER — SODIUM CHLORIDE 0.9 % WEIGHT BASED INFUSION: 3.0000 mL/kg/h | INTRAVENOUS | Status: AC

## 2019-12-21 MED ORDER — HEPARIN (PORCINE) IN NACL 1000-0.9 UT/500ML-% IV SOLN
INTRAVENOUS | Status: AC
  Filled 2019-12-21: qty 1000

## 2019-12-21 MED ORDER — SODIUM CHLORIDE 0.9 % IV SOLN
250.0000 mL | INTRAVENOUS | Status: DC | PRN
  Administered 2019-12-21: 250 mL via INTRAVENOUS

## 2019-12-21 MED ORDER — IOHEXOL 300 MG/ML  SOLN
INTRAMUSCULAR | Status: DC | PRN
  Administered 2019-12-21: 60 mL

## 2019-12-21 MED ORDER — FENTANYL CITRATE (PF) 100 MCG/2ML IJ SOLN
INTRAMUSCULAR | Status: AC
  Filled 2019-12-21: qty 2

## 2019-12-21 MED ORDER — MIDAZOLAM HCL 2 MG/2ML IJ SOLN
INTRAMUSCULAR | Status: AC
  Filled 2019-12-21: qty 2

## 2019-12-21 MED ORDER — ASPIRIN 81 MG PO CHEW
81.0000 mg | CHEWABLE_TABLET | ORAL | Status: AC
  Administered 2019-12-21: 10:00:00 81 mg via ORAL

## 2019-12-21 MED ORDER — VERAPAMIL HCL 2.5 MG/ML IV SOLN
INTRAVENOUS | Status: DC | PRN
  Administered 2019-12-21: 2.5 mg via INTRAVENOUS

## 2019-12-21 MED ORDER — HEPARIN SODIUM (PORCINE) 1000 UNIT/ML IJ SOLN
INTRAMUSCULAR | Status: DC | PRN
  Administered 2019-12-21: 6000 [IU] via INTRAVENOUS

## 2019-12-21 MED ORDER — VERAPAMIL HCL 2.5 MG/ML IV SOLN
INTRAVENOUS | Status: AC
  Filled 2019-12-21: qty 2

## 2019-12-21 MED ORDER — SODIUM CHLORIDE 0.9% FLUSH: 3.0000 mL | Freq: Two times a day (BID) | INTRAVENOUS | Status: DC

## 2019-12-21 MED ORDER — HEPARIN SODIUM (PORCINE) 1000 UNIT/ML IJ SOLN
INTRAMUSCULAR | Status: AC
  Filled 2019-12-21: qty 1

## 2019-12-21 MED ORDER — FENTANYL CITRATE (PF) 100 MCG/2ML IJ SOLN
INTRAMUSCULAR | Status: DC | PRN
  Administered 2019-12-21: 25 ug via INTRAVENOUS

## 2019-12-21 MED ORDER — ASPIRIN 81 MG PO CHEW
CHEWABLE_TABLET | ORAL | Status: AC
  Filled 2019-12-21: qty 1

## 2019-12-21 MED ORDER — MIDAZOLAM HCL 2 MG/2ML IJ SOLN
INTRAMUSCULAR | Status: DC | PRN
  Administered 2019-12-21: 1 mg via INTRAVENOUS

## 2019-12-21 SURGICAL SUPPLY — 11 items
CATH BALLN WEDGE 5F 110CM (CATHETERS) ×2 IMPLANT
CATH INFINITI 5FR JK (CATHETERS) ×2 IMPLANT
CATH INFINITI JR4 5F (CATHETERS) ×2 IMPLANT
DEVICE RAD COMP TR BAND LRG (VASCULAR PRODUCTS) ×2 IMPLANT
GLIDESHEATH SLEND SS 6F .021 (SHEATH) ×2 IMPLANT
GUIDEWIRE .025 260CM (WIRE) ×2 IMPLANT
GUIDEWIRE INQWIRE 1.5J.035X260 (WIRE) ×1 IMPLANT
INQWIRE 1.5J .035X260CM (WIRE) ×2
KIT MANI 3VAL PERCEP (MISCELLANEOUS) ×2 IMPLANT
PACK CARDIAC CATH (CUSTOM PROCEDURE TRAY) ×2 IMPLANT
SHEATH GLIDE SLENDER 4/5FR (SHEATH) ×2 IMPLANT

## 2019-12-21 NOTE — Addendum Note (Signed)
Addended by: Loel Dubonnet on: 12/21/2019 09:01 AM   Modules accepted: Orders, SmartSet

## 2019-12-21 NOTE — Progress Notes (Signed)
Patient oxygen saturation in low 80's on home rate of 3Liters O2 after changing clothes for pre-procedure.  After recovery time, patient oxygen saturation improved to 86-90% on 3liters,  patient reports her breathing is currently at baseline and she does not feel short of breath, she is currently tachypnic with pursed lip breathing.  Patient reports baseline O2 around 88% on 3liters. Discussed with MD at bedside.

## 2019-12-21 NOTE — Interval H&P Note (Signed)
Cath Lab Visit (complete for each Cath Lab visit)  Clinical Evaluation Leading to the Procedure:   ACS: No.  Non-ACS:    Anginal Classification: CCS III  Anti-ischemic medical therapy: Minimal Therapy (1 class of medications)  Non-Invasive Test Results: Intermediate-risk stress test findings: cardiac mortality 1-3%/year  Prior CABG: No previous CABG      History and Physical Interval Note:  12/21/2019 9:50 AM  Paula Torres  has presented today for surgery, with the diagnosis of LT Heart Cath poss PCI   Abnormal cardiovascular stress test.  The various methods of treatment have been discussed with the patient and family. After consideration of risks, benefits and other options for treatment, the patient has consented to  Procedure(s): LEFT HEART CATH AND CORONARY ANGIOGRAPHY (Left) as a surgical intervention.  The patient's history has been reviewed, patient examined, no change in status, stable for surgery.  I have reviewed the patient's chart and labs.  Questions were answered to the patient's satisfaction.     Paula Torres

## 2019-12-22 ENCOUNTER — Encounter: Payer: Self-pay | Admitting: Cardiology

## 2020-01-19 ENCOUNTER — Emergency Department: Payer: Medicare Other

## 2020-01-19 ENCOUNTER — Inpatient Hospital Stay
Admission: EM | Admit: 2020-01-19 | Discharge: 2020-01-22 | DRG: 291 | Disposition: A | Discharge status: Home / Self Care | Payer: Medicare Other | Attending: Pulmonary Disease | Admitting: Pulmonary Disease

## 2020-01-19 ENCOUNTER — Other Ambulatory Visit: Payer: Self-pay

## 2020-01-19 DIAGNOSIS — J441 Chronic obstructive pulmonary disease with (acute) exacerbation: Secondary | ICD-10-CM | POA: Diagnosis present

## 2020-01-19 DIAGNOSIS — Z7982 Long term (current) use of aspirin: Secondary | ICD-10-CM

## 2020-01-19 DIAGNOSIS — I4891 Unspecified atrial fibrillation: Secondary | ICD-10-CM | POA: Diagnosis not present

## 2020-01-19 DIAGNOSIS — I480 Paroxysmal atrial fibrillation: Secondary | ICD-10-CM | POA: Diagnosis present

## 2020-01-19 DIAGNOSIS — G4733 Obstructive sleep apnea (adult) (pediatric): Secondary | ICD-10-CM | POA: Diagnosis present

## 2020-01-19 DIAGNOSIS — Z20822 Contact with and (suspected) exposure to covid-19: Secondary | ICD-10-CM | POA: Diagnosis present

## 2020-01-19 DIAGNOSIS — Z9981 Dependence on supplemental oxygen: Secondary | ICD-10-CM

## 2020-01-19 DIAGNOSIS — I2721 Secondary pulmonary arterial hypertension: Secondary | ICD-10-CM | POA: Diagnosis present

## 2020-01-19 DIAGNOSIS — J9622 Acute and chronic respiratory failure with hypercapnia: Secondary | ICD-10-CM | POA: Diagnosis present

## 2020-01-19 DIAGNOSIS — Z87891 Personal history of nicotine dependence: Secondary | ICD-10-CM | POA: Diagnosis not present

## 2020-01-19 DIAGNOSIS — Z8249 Family history of ischemic heart disease and other diseases of the circulatory system: Secondary | ICD-10-CM | POA: Diagnosis not present

## 2020-01-19 DIAGNOSIS — I5033 Acute on chronic diastolic (congestive) heart failure: Secondary | ICD-10-CM | POA: Diagnosis present

## 2020-01-19 DIAGNOSIS — Z885 Allergy status to narcotic agent status: Secondary | ICD-10-CM | POA: Diagnosis not present

## 2020-01-19 DIAGNOSIS — Z882 Allergy status to sulfonamides status: Secondary | ICD-10-CM | POA: Diagnosis not present

## 2020-01-19 DIAGNOSIS — J9601 Acute respiratory failure with hypoxia: Secondary | ICD-10-CM | POA: Diagnosis not present

## 2020-01-19 DIAGNOSIS — E785 Hyperlipidemia, unspecified: Secondary | ICD-10-CM | POA: Diagnosis present

## 2020-01-19 DIAGNOSIS — I272 Pulmonary hypertension, unspecified: Secondary | ICD-10-CM | POA: Diagnosis present

## 2020-01-19 DIAGNOSIS — Z79899 Other long term (current) drug therapy: Secondary | ICD-10-CM

## 2020-01-19 DIAGNOSIS — J9602 Acute respiratory failure with hypercapnia: Secondary | ICD-10-CM | POA: Diagnosis not present

## 2020-01-19 DIAGNOSIS — Z7951 Long term (current) use of inhaled steroids: Secondary | ICD-10-CM | POA: Diagnosis not present

## 2020-01-19 DIAGNOSIS — I110 Hypertensive heart disease with heart failure: Principal | ICD-10-CM | POA: Diagnosis present

## 2020-01-19 DIAGNOSIS — I2510 Atherosclerotic heart disease of native coronary artery without angina pectoris: Secondary | ICD-10-CM | POA: Diagnosis present

## 2020-01-19 DIAGNOSIS — J9621 Acute and chronic respiratory failure with hypoxia: Secondary | ICD-10-CM | POA: Diagnosis present

## 2020-01-19 DIAGNOSIS — E668 Other obesity: Secondary | ICD-10-CM | POA: Diagnosis not present

## 2020-01-19 LAB — CBC WITH DIFFERENTIAL/PLATELET
Abs Immature Granulocytes: 0.08 10*3/uL — ABNORMAL HIGH (ref 0.00–0.07)
Basophils Absolute: 0.1 10*3/uL (ref 0.0–0.1)
Basophils Relative: 1 %
Eosinophils Absolute: 0.7 10*3/uL — ABNORMAL HIGH (ref 0.0–0.5)
Eosinophils Relative: 4 %
HCT: 40.6 % (ref 36.0–46.0)
Hemoglobin: 12.2 g/dL (ref 12.0–15.0)
Immature Granulocytes: 1 %
Lymphocytes Relative: 8 %
Lymphs Abs: 1.4 10*3/uL (ref 0.7–4.0)
MCH: 27.5 pg (ref 26.0–34.0)
MCHC: 30 g/dL (ref 30.0–36.0)
MCV: 91.6 fL (ref 80.0–100.0)
Monocytes Absolute: 0.9 10*3/uL (ref 0.1–1.0)
Monocytes Relative: 5 %
Neutro Abs: 14.4 10*3/uL — ABNORMAL HIGH (ref 1.7–7.7)
Neutrophils Relative %: 81 %
Platelets: 332 10*3/uL (ref 150–400)
RBC: 4.43 MIL/uL (ref 3.87–5.11)
RDW: 15 % (ref 11.5–15.5)
WBC: 17.5 10*3/uL — ABNORMAL HIGH (ref 4.0–10.5)
nRBC: 0 % (ref 0.0–0.2)

## 2020-01-19 LAB — BLOOD GAS, ARTERIAL
Acid-Base Excess: 10.1 mmol/L — ABNORMAL HIGH (ref 0.0–2.0)
Bicarbonate: 38.6 mmol/L — ABNORMAL HIGH (ref 20.0–28.0)
Expiratory PAP: 8
FIO2: 0.28
Inspiratory PAP: 18
O2 Saturation: 88.8 %
Patient temperature: 37
pCO2 arterial: 70 mmHg (ref 32.0–48.0)
pH, Arterial: 7.35 (ref 7.350–7.450)
pO2, Arterial: 59 mmHg — ABNORMAL LOW (ref 83.0–108.0)

## 2020-01-19 LAB — COMPREHENSIVE METABOLIC PANEL
ALT: 14 U/L (ref 0–44)
AST: 15 U/L (ref 15–41)
Albumin: 3.9 g/dL (ref 3.5–5.0)
Alkaline Phosphatase: 84 U/L (ref 38–126)
Anion gap: 6 (ref 5–15)
BUN: 16 mg/dL (ref 8–23)
CO2: 36 mmol/L — ABNORMAL HIGH (ref 22–32)
Calcium: 8.9 mg/dL (ref 8.9–10.3)
Chloride: 99 mmol/L (ref 98–111)
Creatinine, Ser: 0.76 mg/dL (ref 0.44–1.00)
GFR calc Af Amer: >60 mL/min (ref >60)
GFR calc non Af Amer: >60 mL/min (ref >60)
Glucose, Bld: 198 mg/dL — ABNORMAL HIGH (ref 70–99)
Potassium: 4.2 mmol/L (ref 3.5–5.1)
Sodium: 141 mmol/L (ref 135–145)
Total Bilirubin: 0.7 mg/dL (ref 0.3–1.2)
Total Protein: 7.2 g/dL (ref 6.5–8.1)

## 2020-01-19 LAB — BASIC METABOLIC PANEL
Anion gap: 13 (ref 5–15)
BUN: 18 mg/dL (ref 8–23)
CO2: 34 mmol/L — ABNORMAL HIGH (ref 22–32)
Calcium: 9.4 mg/dL (ref 8.9–10.3)
Chloride: 94 mmol/L — ABNORMAL LOW (ref 98–111)
Creatinine, Ser: 0.91 mg/dL (ref 0.44–1.00)
GFR calc Af Amer: >60 mL/min (ref >60)
GFR calc non Af Amer: >60 mL/min (ref >60)
Glucose, Bld: 166 mg/dL — ABNORMAL HIGH (ref 70–99)
Potassium: 4.3 mmol/L (ref 3.5–5.1)
Sodium: 141 mmol/L (ref 135–145)

## 2020-01-19 LAB — BLOOD GAS, VENOUS
Acid-Base Excess: 8.9 mmol/L — ABNORMAL HIGH (ref 0.0–2.0)
Bicarbonate: 39.9 mmol/L — ABNORMAL HIGH (ref 20.0–28.0)
FIO2: 0.3
O2 Saturation: 56.4 %
Patient temperature: 37
pCO2, Ven: 91 mmHg (ref 44.0–60.0)
pH, Ven: 7.25 (ref 7.250–7.430)
pO2, Ven: 35 mmHg (ref 32.0–45.0)

## 2020-01-19 LAB — BRAIN NATRIURETIC PEPTIDE: B Natriuretic Peptide: 66 pg/mL (ref 0.0–100.0)

## 2020-01-19 LAB — SARS CORONAVIRUS 2 BY RT PCR (HOSPITAL ORDER, PERFORMED IN ~~LOC~~ HOSPITAL LAB): SARS Coronavirus 2: NEGATIVE

## 2020-01-19 LAB — MRSA PCR SCREENING: MRSA by PCR: NEGATIVE

## 2020-01-19 LAB — GLUCOSE, CAPILLARY
Glucose-Capillary: 162 mg/dL — ABNORMAL HIGH (ref 70–99)
Glucose-Capillary: 181 mg/dL — ABNORMAL HIGH (ref 70–99)
Glucose-Capillary: 169 mg/dL — ABNORMAL HIGH (ref 70–99)
Glucose-Capillary: 137 mg/dL — ABNORMAL HIGH (ref 70–99)
Glucose-Capillary: 153 mg/dL — ABNORMAL HIGH (ref 70–99)

## 2020-01-19 LAB — PROCALCITONIN: Procalcitonin: <0.1 ng/mL

## 2020-01-19 MED ORDER — HEPARIN SODIUM (PORCINE) 5000 UNIT/ML IJ SOLN
5000.0000 [IU] | Freq: Three times a day (TID) | INTRAMUSCULAR | Status: DC
  Administered 2020-01-19 – 2020-01-20 (×4): 5000 [IU] via SUBCUTANEOUS
  Filled 2020-01-19 (×5): qty 1

## 2020-01-19 MED ORDER — INSULIN ASPART 100 UNIT/ML ~~LOC~~ SOLN
0.0000 [IU] | Freq: Every day | SUBCUTANEOUS | Status: DC
  Filled 2020-01-19: qty 1

## 2020-01-19 MED ORDER — METHYLPREDNISOLONE SODIUM SUCC 125 MG IJ SOLR
125.0000 mg | Freq: Once | INTRAMUSCULAR | Status: AC
  Administered 2020-01-19: 125 mg via INTRAVENOUS
  Filled 2020-01-19: qty 2

## 2020-01-19 MED ORDER — IPRATROPIUM-ALBUTEROL 0.5-2.5 (3) MG/3ML IN SOLN
RESPIRATORY_TRACT | Status: AC
  Administered 2020-01-19: 3 mL via RESPIRATORY_TRACT
  Filled 2020-01-19: qty 3

## 2020-01-19 MED ORDER — IPRATROPIUM-ALBUTEROL 0.5-2.5 (3) MG/3ML IN SOLN
3.0000 mL | Freq: Once | RESPIRATORY_TRACT | Status: AC
  Filled 2020-01-19: qty 3

## 2020-01-19 MED ORDER — FUROSEMIDE 10 MG/ML IJ SOLN
40.0000 mg | Freq: Every day | INTRAMUSCULAR | Status: DC
  Administered 2020-01-19 – 2020-01-21 (×3): 40 mg via INTRAVENOUS
  Filled 2020-01-19 (×4): qty 4

## 2020-01-19 MED ORDER — ONDANSETRON HCL 4 MG/2ML IJ SOLN: 4.0000 mg | Freq: Four times a day (QID) | INTRAMUSCULAR | Status: DC | PRN

## 2020-01-19 MED ORDER — SODIUM CHLORIDE 0.9 % IV SOLN: 1.0000 g | INTRAVENOUS | Status: DC

## 2020-01-19 MED ORDER — FENTANYL 2500MCG IN NS 250ML (10MCG/ML) PREMIX INFUSION: 25.0000 ug/h | INTRAVENOUS | Status: DC

## 2020-01-19 MED ORDER — IPRATROPIUM-ALBUTEROL 0.5-2.5 (3) MG/3ML IN SOLN
3.0000 mL | Freq: Four times a day (QID) | RESPIRATORY_TRACT | Status: DC
  Administered 2020-01-19 – 2020-01-21 (×9): 3 mL via RESPIRATORY_TRACT
  Filled 2020-01-19 (×9): qty 3

## 2020-01-19 MED ORDER — PREDNISONE 10 MG PO TABS
40.0000 mg | ORAL_TABLET | Freq: Every day | ORAL | Status: DC
  Administered 2020-01-19 – 2020-01-22 (×4): 40 mg via ORAL
  Filled 2020-01-19 (×4): qty 4

## 2020-01-19 MED ORDER — SODIUM CHLORIDE 0.9 % IV SOLN
1.0000 g | Freq: Once | INTRAVENOUS | Status: AC
  Administered 2020-01-19: 1 g via INTRAVENOUS
  Filled 2020-01-19: qty 10

## 2020-01-19 MED ORDER — CHLORHEXIDINE GLUCONATE CLOTH 2 % EX PADS
6.0000 | MEDICATED_PAD | Freq: Every day | CUTANEOUS | Status: DC
  Administered 2020-01-19 – 2020-01-21 (×3): 6 via TOPICAL
  Filled 2020-01-19: qty 6

## 2020-01-19 MED ORDER — INSULIN ASPART 100 UNIT/ML ~~LOC~~ SOLN
0.0000 [IU] | Freq: Three times a day (TID) | SUBCUTANEOUS | Status: DC
  Administered 2020-01-19: 3 [IU] via SUBCUTANEOUS
  Administered 2020-01-19: 2 [IU] via SUBCUTANEOUS
  Administered 2020-01-19: 3 [IU] via SUBCUTANEOUS
  Administered 2020-01-20 (×3): 2 [IU] via SUBCUTANEOUS
  Administered 2020-01-21: 3 [IU] via SUBCUTANEOUS
  Administered 2020-01-21: 2 [IU] via SUBCUTANEOUS
  Filled 2020-01-19 (×8): qty 1

## 2020-01-19 MED ORDER — DOCUSATE SODIUM 100 MG PO CAPS: 100.0000 mg | ORAL_CAPSULE | Freq: Two times a day (BID) | ORAL | Status: DC | PRN

## 2020-01-19 MED ORDER — FUROSEMIDE 10 MG/ML IJ SOLN
INTRAMUSCULAR | Status: AC
  Administered 2020-01-19: 40 mg via INTRAVENOUS
  Filled 2020-01-19: qty 4

## 2020-01-19 MED ORDER — SODIUM CHLORIDE 0.9 % IV SOLN: 500.0000 mg | Freq: Once | INTRAVENOUS | Status: DC

## 2020-01-19 MED ORDER — DOXYCYCLINE HYCLATE 100 MG PO TABS
100.0000 mg | ORAL_TABLET | Freq: Two times a day (BID) | ORAL | Status: DC
  Administered 2020-01-19 – 2020-01-22 (×7): 100 mg via ORAL
  Filled 2020-01-19 (×7): qty 1

## 2020-01-19 MED ORDER — POLYETHYLENE GLYCOL 3350 17 G PO PACK: 17.0000 g | PACK | Freq: Every day | ORAL | Status: DC | PRN

## 2020-01-19 MED ORDER — CHLORHEXIDINE GLUCONATE 0.12 % MT SOLN
15.0000 mL | Freq: Two times a day (BID) | OROMUCOSAL | Status: DC
  Administered 2020-01-19 – 2020-01-21 (×5): 15 mL via OROMUCOSAL
  Filled 2020-01-19 (×5): qty 15

## 2020-01-19 MED ORDER — IPRATROPIUM-ALBUTEROL 0.5-2.5 (3) MG/3ML IN SOLN
3.0000 mL | Freq: Once | RESPIRATORY_TRACT | Status: AC
  Administered 2020-01-19: 3 mL via RESPIRATORY_TRACT

## 2020-01-19 MED ORDER — FUROSEMIDE 10 MG/ML IJ SOLN: 40.0000 mg | Freq: Once | INTRAMUSCULAR | Status: AC

## 2020-01-19 MED ORDER — ORAL CARE MOUTH RINSE
15.0000 mL | Freq: Two times a day (BID) | OROMUCOSAL | Status: DC
  Administered 2020-01-19 – 2020-01-21 (×5): 15 mL via OROMUCOSAL

## 2020-01-19 MED ORDER — AMBRISENTAN 5 MG PO TABS
5.0000 mg | ORAL_TABLET | Freq: Every day | ORAL | Status: DC
  Administered 2020-01-19 – 2020-01-20 (×2): 5 mg via ORAL
  Filled 2020-01-19 (×5): qty 1

## 2020-01-19 MED ORDER — FUROSEMIDE 10 MG/ML IJ SOLN: 80.0000 mg | Freq: Once | INTRAMUSCULAR | Status: DC

## 2020-01-19 NOTE — ED Notes (Signed)
This nurse collected set #1 of Blood Cultures; site LFA. Held in room at this time.

## 2020-01-19 NOTE — ED Provider Notes (Signed)
[facility] Emergency Department Provider Note  ____________________________________________   First MD Initiated Contact with Patient 01/19/20 [phone]     (approximate)  I have reviewed the triage vital signs and the nursing notes.   HISTORY  Chief Complaint COPD   HPI Paula Torres is a 69 y.o. female with below list of previous medical conditions including COPD and chronic diastolic CHF presents to the emergency department via EMS with respiratory distress.  Per EMS starting midnight tonight the patient started experiencing progressive dyspnea with oxygen saturations at 88% on their arrival with apparent increased work of breathing.  EMS states the patient's respiratory rate was 40.  On arrival to the emergency department patient tachypneic with apparent respiratory distress.        Past Medical History:  Diagnosis Date  . CHF (congestive heart failure) ([place])   . COPD (chronic obstructive pulmonary disease) ([place])   . Hypertension     Patient Active Problem List   Diagnosis Date Noted  . Abnormal cardiovascular stress test   . Demand ischemia ([place])   . Hyperlipidemia   . COPD with acute exacerbation ([place]) 11/08/2019  . Chronic diastolic (congestive) heart failure ([place]) 11/08/2019  . Acute respiratory failure with hypoxia and hypercapnia ([place]) 11/08/2019  . Morbid obesity with BMI of 50.0-59.9, adult ([place]) 11/08/2019  . Elevated troponin 11/08/2019    Past Surgical History:  Procedure Laterality Date  . NO PAST SURGERIES    . RIGHT/LEFT HEART CATH AND CORONARY ANGIOGRAPHY Left 12/21/2019   Procedure: RIGHT/LEFT HEART CATH AND CORONARY ANGIOGRAPHY;  Surgeon: Wellington Hampshire, MD;  Location: [place] [facility];  Service: Cardiovascular;  Laterality: Left;    Prior to Admission medications   Medication Sig Start Date End Date Taking? Authorizing Provider  albuterol (VENTOLIN HFA) 108 (90 Base) MCG/ACT inhaler Inhale 2 puffs into the lungs  every 6 (six) hours as needed for wheezing or shortness of breath.    [provider]  aspirin EC 81 MG tablet Take 81 mg by mouth daily.    [provider]  atorvastatin (LIPITOR) 20 MG tablet Take 1 tablet (20 mg total) by mouth daily at 6 PM. 11/23/19   Loel Dubonnet, NP  bisoprolol (ZEBETA) 5 MG tablet Take 1 tablet (5 mg total) by mouth daily. 11/23/19   Loel Dubonnet, NP  escitalopram (LEXAPRO) 20 MG tablet Take 20 mg by mouth daily.    [provider]  famotidine (PEPCID) 20 MG tablet Take 1 tablet (20 mg total) by mouth daily for 30 doses. 11/10/19 12/10/19  Nolberto Hanlon, MD  furosemide (LASIX) 40 MG tablet Take 1 tablet (40 mg total) by mouth daily. 11/23/19   Loel Dubonnet, NP  gabapentin (NEURONTIN) 300 MG capsule Take 300 mg by mouth at bedtime.  11/12/19   [provider]  ibuprofen (ADVIL) 200 MG tablet Take 600-800 mg by mouth every 8 (eight) hours as needed (for pain.).    [provider]  Ipratropium-Albuterol (COMBIVENT RESPIMAT) 20-100 MCG/ACT AERS respimat Inhale 1 puff into the lungs every 6 (six) hours as needed (wheezing/shortness of breath.).     [provider]  ipratropium-albuterol (DUONEB) 0.5-2.5 (3) MG/3ML SOLN Take 3 mLs by nebulization every 6 (six) hours as needed (for wheezing/shortness of breath.).     [provider]  rOPINIRole (REQUIP XL) 2 MG 24 hr tablet Take 2 mg by mouth at bedtime.    [provider]  spironolactone (ALDACTONE) 25 MG tablet  Take 1 tablet (25 mg total) by mouth daily. 11/23/19   Loel Dubonnet, NP  TRELEGY ELLIPTA 100-62.5-25 MCG/INH AEPB Inhale 1 puff into the lungs daily. 11/20/19   [provider]    Allergies Codeine and Sulfa antibiotics  Family History  Problem Relation Age of Onset  . Hypertension Other     Social History Social History   Tobacco Use  . Smoking status: Former Smoker    Types: Cigarettes    Quit date: 06/03/2017    Years  since quitting: 2.6  . Smokeless tobacco: Never Used  Substance Use Topics  . Alcohol use: Not Currently  . Drug use: Never    Review of Systems Constitutional: No fever/chills Eyes: No visual changes. ENT: No sore throat. Cardiovascular: Denies chest pain. Respiratory: Positive for dyspnea Gastrointestinal: No abdominal pain.  No nausea, no vomiting.  No diarrhea.  No constipation. Genitourinary: Negative for dysuria. Musculoskeletal: Negative for neck pain.  Negative for back pain. Integumentary: Negative for rash. Neurological: Negative for headaches, focal weakness or numbness.  ____________________________________________   PHYSICAL EXAM:  VITAL SIGNS: ED Triage Vitals  Enc Vitals Group     BP 01/19/20 0441 (!) 171/88     Pulse Rate 01/19/20 0441 89     Resp 01/19/20 0441 19     Temp 01/19/20 0500 97.6 F (36.4 C)     Temp Source 01/19/20 0500 Axillary     SpO2 01/19/20 0441 98 %     Weight 01/19/20 0455 118.8 kg (261 lb 14.5 oz)     Height 01/19/20 0455 1.651 m (_0 )     Head Circumference --      Peak Flow --      Pain Score 01/19/20 0455 0     Pain Loc --      Pain Edu? --      Excl. in [place]? --     Constitutional: Alert and oriented.  Apparent respiratory distress Eyes: Conjunctivae are normal.  Head: Atraumatic. Mouth/Throat: Patient is wearing a mask. Neck: No stridor.  No meningeal signs.   Cardiovascular: Normal rate, regular rhythm. Good peripheral circulation. Grossly normal heart sounds. Respiratory: Tachypnea, diffuse rhonchi/wheezing Gastrointestinal: Soft and nontender. No distention.  Musculoskeletal: No lower extremity tenderness nor edema. No gross deformities of extremities. Neurologic:  Normal speech and language. No gross focal neurologic deficits are appreciated.  Skin:  Skin is warm, dry and intact. Psychiatric: Mood and affect are normal. Speech and behavior are normal.  ____________________________________________   LABS (all  labs ordered are listed, but only abnormal results are displayed)  Labs Reviewed  CBC WITH DIFFERENTIAL/PLATELET - Abnormal; Notable for the following components:      Result Value   WBC 17.5 (*)    Neutro Abs 14.4 (*)    Eosinophils Absolute 0.7 (*)    Abs Immature Granulocytes 0.08 (*)    All other components within normal limits  COMPREHENSIVE METABOLIC PANEL - Abnormal; Notable for the following components:   CO2 36 (*)    Glucose, Bld 198 (*)    All other components within normal limits  BLOOD GAS, VENOUS - Abnormal; Notable for the following components:   pCO2, Ven 91 (*)    Bicarbonate 39.9 (*)    Acid-Base Excess 8.9 (*)    All other components within normal limits  BRAIN NATRIURETIC PEPTIDE   ____________________________________________  EKG  ED ECG REPORT I, [place] N Eann Cleland, the attending physician, personally viewed and interpreted this ECG.   Date:  01/19/2020  EKG Time: 4:36 AM  Rate: 96  Rhythm: Normal sinus rhythm  Axis: Rightward axis deviation  Intervals: Borderline prolonged QT  ST&T Change: None  ED ECG REPORT I, [place] N Jaidan Stachnik, the attending physician, personally viewed and interpreted this ECG.   Date: 01/19/2020  EKG Time: 4:45 AM  Rate: 85  Rhythm: Normal sinus rhythm  Axis: Rightward axis  Intervals: Borderline prolonged QT  ST&T Change: None   ____________________________________________  RADIOLOGY I, [place] N Davy [place], personally viewed and evaluated these images (plain radiographs) as part of my medical decision making, as well as reviewing the written report by the radiologist.  ED MD interpretation: Interstitial prominence favoring vascular congestion on chest x-ray per radiologist.  Official radiology report(s): DG Chest Port 1 View  Result Date: 01/19/2020 CLINICAL DATA:  Dyspnea EXAM: PORTABLE CHEST 1 VIEW COMPARISON:  01/08/2020 FINDINGS: Diffuse interstitial prominence with probable cephalized blood flow. No Kerley line or  air bronchogram. No effusion or clear pneumothorax. IMPRESSION: Interstitial prominence favoring vascular congestion Electronically Signed   By: Monte Fantasia M.D.   On: 01/19/2020 05:07    .Critical Care Performed by: Gregor Hams, MD Authorized by: Gregor Hams, MD   Critical care provider statement:    Critical care time (minutes):  30   Critical care time was exclusive of:  Separately billable procedures and treating other patients   Critical care was necessary to treat or prevent imminent or life-threatening deterioration of the following conditions:  Respiratory failure   Critical care was time spent personally by me on the following activities:  Development of treatment plan with patient or surrogate, discussions with consultants, evaluation of patient's response to treatment, examination of patient, obtaining history from patient or surrogate, ordering and performing treatments and interventions, ordering and review of laboratory studies, ordering and review of radiographic studies, pulse oximetry, re-evaluation of patient's condition and review of old charts   I assumed direction of critical care for this patient from another provider in my specialty: no       ____________________________________________   INITIAL IMPRESSION / MDM / [place] / ED COURSE  As part of my medical decision making, I reviewed the following data within the electronic MEDICAL RECORD NUMBER  69 year old female presented with above-stated history and physical exam secondary to respiratory distress.  Differential diagnosis including but not limited to COPD exacerbation, CHF exacerbation, pneumonia pulmonary emboli ACS.  EKG revealed no evidence of ischemia or infarction.  Laboratory data revealed blood gas pH 7.25 with a CO2 of 91 chest x-ray findings consistent with vascular congestion.  Patient was given an additional DuoNeb in the emergency department.  Patient also given Lasix 40 mg IV  BiPAP was applied.  On reevaluation patient's respiratory status improved.  Patient discussed with Colletta Maryland nurse practitioner in the ICU for hospital admission for further evaluation and management.  ____________________________________________  FINAL CLINICAL IMPRESSION(S) / ED DIAGNOSES  Final diagnoses:  Acute respiratory failure with hypoxia and hypercapnia ([place])     MEDICATIONS GIVEN DURING THIS VISIT:  Medications  cefTRIAXone (ROCEPHIN) 1 g in sodium chloride 0.9 % 100 mL IVPB (1 g Intravenous New Bag/Given 01/19/20 0531)  azithromycin (ZITHROMAX) 500 mg in sodium chloride 0.9 % 250 mL IVPB (has no administration in time range)  furosemide (LASIX) injection 40 mg (40 mg Intravenous Given 01/19/20 0448)  ipratropium-albuterol (DUONEB) 0.5-2.5 (3) MG/3ML nebulizer solution 3 mL (3 mLs Nebulization Given 01/19/20 0449)  methylPREDNISolone sodium succinate (SOLU-MEDROL) 125 mg/2 mL injection  125 mg (125 mg Intravenous Given 01/19/20 0444)  ipratropium-albuterol (DUONEB) 0.5-2.5 (3) MG/3ML nebulizer solution 3 mL (3 mLs Nebulization Given 01/19/20 0449)     ED Discharge Orders    None      *Please note:  Yittel Emrich was evaluated in Emergency Department on 01/19/2020 for the symptoms described in the history of present illness. She was evaluated in the context of the global COVID-19 pandemic, which necessitated consideration that the patient might be at risk for infection with the SARS-CoV-2 virus that causes COVID-19. Institutional protocols and algorithms that pertain to the evaluation of patients at risk for COVID-19 are in a state of rapid change based on information released by regulatory bodies including the CDC and federal and state organizations. These policies and algorithms were followed during the patient's care in the ED.  Some ED evaluations and interventions may be delayed as a result of limited staffing during the pandemic.*  Note:  This document was prepared using  Dragon voice recognition software and may include unintentional dictation errors.   Gregor Hams, MD 01/19/20 [phone]

## 2020-01-19 NOTE — H&P (Signed)
Paula Torres is an 69 y.o. female.    Chief Complaint: Shortness of breath, tachypnea  HPI: 69yo female w/ a past medical history of COPD and diastolic heart failure who presented to the ED this AM w/ progressive dyspnea that began around midnight. Upon arrival to ED via EMS, the patient was tachypneic w/RR in the 40s, oxygen saturation in the 80s and in obvious distress. She was given 72m lasix IVP, 1250mSolumedrol, Duoneb and placed on BIPAP. Critical care was called for admission to the ICU.   Patient seen and examined in the ED. She is alert and able to answer simple questions but limited due to BIPAP use. Oxygen saturation 91%. HR 90s and SBP in the 160s. She reports feeling better since being placed on BIPAP.   Past Medical History:  Diagnosis Date  . [place] ([place]estive heart failure) ([place]  . [place] disease) [place]  . Hypertension     Past Surgical History:  Procedure Laterality Date  . NO PAST SURGERIES    . RIGHT/LEFT HEART CATH AND CORONARY ANGIOGRAPHY Left 12/21/2019   Procedure: RIGHT/LEFT HEART CATH AND CORONARY ANGIOGRAPHY;  Surgeon: ArWellington HampshireMD;  [facility];  Service: Cardiovascular;  Laterality: Left;    Family History  Problem Relation Age of Onset  . Hypertension Other    Social History:  reports that she quit smoking about 2 years ago. Her smoking use included cigarettes. She has never used smokeless tobacco. She reports previous alcohol use. She reports that she does not use drugs.  Allergies:  Allergies  Allergen Reactions  . Codeine     Back Headache  . Sulfa Antibiotics     Reported to pt from mother who is no longer living -  Details unknown    Medications Prior to Admission  Medication Sig Dispense Refill  . azithromycin (ZITHROMAX) 250 MG tablet Take 250 mg by mouth See admin instructions. Take 1 tablet by mouth as directed for up to 5 days.    . Treprostinil (TYVASO) 0.6 MG/ML SOLN Inhale 18  mcg into the lungs 4 (four) times daily.    . Marland Kitchenlbuterol (VENTOLIN HFA) 108 (90 Base) MCG/ACT inhaler Inhale 2 puffs into the lungs every 6 (six) hours as needed for wheezing or shortness of breath.    . Marland Kitchenspirin EC 81 MG tablet Take 81 mg by mouth daily.    . Marland Kitchentorvastatin (LIPITOR) 20 MG tablet Take 1 tablet (20 mg total) by mouth daily at 6 PM. 90 tablet 0  . bisoprolol (ZEBETA) 5 MG tablet Take 1 tablet (5 mg total) by mouth daily. 90 tablet 0  . escitalopram (LEXAPRO) 20 MG tablet Take 20 mg by mouth daily.    . famotidine (PEPCID) 20 MG tablet Take 1 tablet (20 mg total) by mouth daily for 30 doses. 30 tablet 1  . furosemide (LASIX) 40 MG tablet Take 1 tablet (40 mg total) by mouth daily. 90 tablet 0  . gabapentin (NEURONTIN) 300 MG capsule Take 300 mg by mouth at bedtime.     . Marland Kitchenbuprofen (ADVIL) 200 MG tablet Take 600-800 mg by mouth every 8 (eight) hours as needed (for pain.).    . Marland Kitchenpratropium-Albuterol (COMBIVENT RESPIMAT) 20-100 MCG/ACT AERS respimat Inhale 1 puff into the lungs every 6 (six) hours as needed (wheezing/shortness of breath.).     . Marland Kitchenpratropium-albuterol (DUONEB) 0.5-2.5 (3) MG/3ML SOLN Take 3 mLs by nebulization every 6 (six) hours as needed (for wheezing/shortness of breath.).     .Marland Kitchen  metoprolol succinate (TOPROL-XL) 25 MG 24 hr tablet Take 25 mg by mouth 2 (two) times daily.    . predniSONE (DELTASONE) 20 MG tablet Take 20 mg by mouth daily.    Marland Kitchen rOPINIRole (REQUIP XL) 2 MG 24 hr tablet Take 2 mg by mouth at bedtime.    Marland Kitchen spironolactone (ALDACTONE) 25 MG tablet Take 1 tablet (25 mg total) by mouth daily. 90 tablet 0  . TRELEGY ELLIPTA 100-62.5-25 MCG/INH AEPB Inhale 1 puff into the lungs daily.      Results for orders placed or performed during the hospital encounter of 01/19/20 (from the past 48 hour(s))  CBC with Differential     Status: Abnormal   Collection Time: 01/19/20  4:33 AM  Result Value Ref Range   WBC 17.5 (H) 4.0 - 10.5 K/uL   RBC 4.43 3.87 - 5.11 MIL/uL    Hemoglobin 12.2 12.0 - 15.0 g/dL   HCT 40.6 36.0 - 46.0 %   MCV 91.6 80.0 - 100.0 fL   MCH 27.5 26.0 - 34.0 pg   MCHC 30.0 30.0 - 36.0 g/dL   RDW 15.0 11.5 - 15.5 %   Platelets 332 150 - 400 K/uL   nRBC 0.0 0.0 - 0.2 %   Neutrophils Relative % 81 %   Neutro Abs 14.4 (H) 1.7 - 7.7 K/uL   Lymphocytes Relative 8 %   Lymphs Abs 1.4 0.7 - 4.0 K/uL   Monocytes Relative 5 %   Monocytes Absolute 0.9 0.1 - 1.0 K/uL   Eosinophils Relative 4 %   Eosinophils Absolute 0.7 (H) 0.0 - 0.5 K/uL   Basophils Relative 1 %   Basophils Absolute 0.1 0.0 - 0.1 K/uL   Immature Granulocytes 1 %   Abs Immature Granulocytes 0.08 (H) 0.00 - 0.07 K/uL    Comment: Performed at [facility], [address] [place], [place] [address]  Brain natriuretic peptide     Status: None   Collection Time: 01/19/20  4:33 AM  Result Value Ref Range   B Natriuretic Peptide 66.0 0.0 - 100.0 pg/mL    Comment: Performed at [facility], [address] [place], [place] [address]  Comprehensive metabolic panel     Status: Abnormal   Collection Time: 01/19/20  4:33 AM  Result Value Ref Range   Sodium 141 135 - 145 mmol/L   Potassium 4.2 3.5 - 5.1 mmol/L   Chloride 99 98 - 111 mmol/L   CO2 36 (H) 22 - 32 mmol/L   Glucose, Bld 198 (H) 70 - 99 mg/dL    Comment: Glucose reference range applies only to samples taken after fasting for at least 8 hours.   BUN 16 8 - 23 mg/dL   Creatinine, Ser 0.76 0.44 - 1.00 mg/dL   Calcium 8.9 8.9 - 10.3 mg/dL   Total Protein 7.2 6.5 - 8.1 g/dL   Albumin 3.9 3.5 - 5.0 g/dL   AST 15 15 - 41 U/L   ALT 14 0 - 44 U/L   Alkaline Phosphatase 84 38 - 126 U/L   Total Bilirubin 0.7 0.3 - 1.2 mg/dL   GFR calc non Af Amer >60 >60 mL/min   GFR calc Af Amer >60 >60 mL/min   Anion gap 6 5 - 15    Comment: Performed at [facility], [address], [place], [place] [address]  Blood gas, venous     Status: Abnormal   Collection Time: 01/19/20  4:59 AM  Result Value Ref  Range   FIO2 0.30    pH, Ven 7.25 7.250 - 7.430   pCO2, Ven 91 (HH) 44.0 - 60.0 mmHg    Comment: CRITICAL RESULT CALLED TO, READ BACK BY AND VERIFIED WITH: REBECCA BAXTER, RN 01/19/20 0510 LKM    pO2, Ven 35.0 32.0 - 45.0 mmHg   Bicarbonate 39.9 (H) 20.0 - 28.0 mmol/L   Acid-Base Excess 8.9 (H) 0.0 - 2.0 mmol/L   O2 Saturation 56.4 %   Patient temperature 37.0    Collection site VEIN    Sample type VENOUS     Comment: Performed at [facility], [address] [place], [place] [address]  Blood gas, arterial     Status: Abnormal   Collection Time: 01/19/20  5:48 AM  Result Value Ref Range   FIO2 0.28    Inspiratory PAP 18    Expiratory PAP 8    pH, Arterial 7.35 7.350 - 7.450   pCO2 arterial 70 (HH) 32.0 - 48.0 mmHg    Comment: CRITICAL RESULT CALLED TO, READ BACK BY AND VERIFIED WITH: REBECCA BAXTER,RN 01/19/20 0558 LKM    pO2, Arterial 59 (L) 83.0 - 108.0 mmHg   Bicarbonate 38.6 (H) 20.0 - 28.0 mmol/L   Acid-Base Excess 10.1 (H) 0.0 - 2.0 mmol/L   O2 Saturation 88.8 %   Patient temperature 37.0    Collection site LEFT RADIAL    Sample type ARTERIAL DRAW    Allens test (pass/fail) PASS PASS    Comment: Performed at [facility], [address] [place], [place] [address]   DG Chest Port 1 View  Result Date: 01/19/2020 CLINICAL DATA:  Dyspnea EXAM: PORTABLE CHEST 1 VIEW COMPARISON:  01/08/2020 FINDINGS: Diffuse interstitial prominence with probable cephalized blood flow. No Kerley line or air bronchogram. No effusion or clear pneumothorax. IMPRESSION: Interstitial prominence favoring vascular congestion Electronically Signed   By: Monte Fantasia M.D.   On: 01/19/2020 05:07    Review of Systems  Respiratory: Positive for shortness of breath.   Cardiovascular: Positive for leg swelling.    Blood pressure (!) 174/94, pulse 84, temperature 97.6 F (36.4 C), temperature source Axillary, resp. rate (!) 29, height _0  (1.651 m), weight 118.8 kg, SpO2 91  %. Physical Exam      Assessment/Plan   Acute on chronic hypoxemic respiratory failure   -NIV BIPAP wean O2 as able   - please give BIPAP breaks for nourishment   -Respiratory viral panel     Acute severe COPD exacerbation  - COPD care path   - DuoNeb q6h  - IV solumedrol   - Bronchopulmonary hygiene  - IS at bedside   -Flutter valve  - PT/OT    Severe pulmonary hypertension    - precapillary Group I and 3   - s/p RHC    - [place] 2m po daily    -Tyvasso ordered on outpatient - awaiting arrival    -currently NYHA 3    -BNP - 66 on this admission   Obstructive sleep apnea   - QHS CPAP - RT for settings   GI/DVT ppx   lovenox , protonix   Critical care provider statement:    Critical care time (minutes):  33   Critical care time was exclusive of:  Separately billable procedures and  treating other patients   Critical care was necessary to treat or prevent imminent or  life-threatening deterioration of the following conditions:  acute on chronic hypoxemic respiratory failure, severe pulmonary htn, AECOPD,  OSA   Critical care was time spent personally by me on the following  activities:  Development of treatment plan with patient or surrogate,  discussions with consultants, evaluation of patient's response to  treatment, examination of patient, obtaining history from patient or  surrogate, ordering and performing treatments and interventions, ordering  and review of laboratory studies and re-evaluation of patient's condition   I assumed direction of critical care for this patient from another  provider in my specialty: no

## 2020-01-19 NOTE — Plan of Care (Addendum)
Pt improved this shift; mentation cleared, Bipap off approx 1045, pt transferred to [facility] and then recliner by writing RN, she was able to do 75% of the work, does retain a tremor in hands, currently on 6 liters [place], answers questions appropriately, family in room with her.  Pt endorses hunger; diet started per Dr Lanney Gins.  VSS. Numerous scabs/scratches, minimally bloody lesions on arms and legs.  Per pt she is "a picker" and doesn't really know why she does this.   Pt also states she "just didn't take my lasix for a few days."  She was not able to verbalize why.

## 2020-01-19 NOTE — ED Triage Notes (Signed)
Pt arrives from home via [facility] for COPD exacerbation, hx of CHF, pt is on nonrebreather, working to breathe, recently stopped taking fluid pills per EMS.  Family states this started around midnight, they say she sats 88% normally at home.  EMS reports BP 170/82 upon arrival to the home.

## 2020-01-19 NOTE — ED Notes (Signed)
Pt pulled IV from left forearm

## 2020-01-19 NOTE — ED Notes (Signed)
Date and time results received: 01/19/20  0515am   Test: pCO2 Critical Value: 29  Name of Provider Notified: Owens Shark

## 2020-01-20 ENCOUNTER — Inpatient Hospital Stay: Payer: Medicare Other

## 2020-01-20 LAB — BASIC METABOLIC PANEL
Anion gap: 12 (ref 5–15)
BUN: 30 mg/dL — ABNORMAL HIGH (ref 8–23)
CO2: 36 mmol/L — ABNORMAL HIGH (ref 22–32)
Calcium: 9.3 mg/dL (ref 8.9–10.3)
Chloride: 93 mmol/L — ABNORMAL LOW (ref 98–111)
Creatinine, Ser: 0.83 mg/dL (ref 0.44–1.00)
GFR calc Af Amer: >60 mL/min (ref >60)
GFR calc non Af Amer: >60 mL/min (ref >60)
Glucose, Bld: 133 mg/dL — ABNORMAL HIGH (ref 70–99)
Potassium: 4.5 mmol/L (ref 3.5–5.1)
Sodium: 141 mmol/L (ref 135–145)

## 2020-01-20 LAB — CBC
HCT: 36.5 % (ref 36.0–46.0)
Hemoglobin: 11.1 g/dL — ABNORMAL LOW (ref 12.0–15.0)
MCH: 27.6 pg (ref 26.0–34.0)
MCHC: 30.4 g/dL (ref 30.0–36.0)
MCV: 90.8 fL (ref 80.0–100.0)
Platelets: 298 10*3/uL (ref 150–400)
RBC: 4.02 MIL/uL (ref 3.87–5.11)
RDW: 15.1 % (ref 11.5–15.5)
WBC: 15.5 10*3/uL — ABNORMAL HIGH (ref 4.0–10.5)
nRBC: 0 % (ref 0.0–0.2)

## 2020-01-20 LAB — HEMOGLOBIN A1C
Hgb A1c MFr Bld: 5.7 % — ABNORMAL HIGH (ref 4.8–5.6)
Mean Plasma Glucose: 117 mg/dL

## 2020-01-20 LAB — GLUCOSE, CAPILLARY
Glucose-Capillary: 129 mg/dL — ABNORMAL HIGH (ref 70–99)
Glucose-Capillary: 133 mg/dL — ABNORMAL HIGH (ref 70–99)
Glucose-Capillary: 141 mg/dL — ABNORMAL HIGH (ref 70–99)
Glucose-Capillary: 172 mg/dL — ABNORMAL HIGH (ref 70–99)

## 2020-01-20 MED ORDER — ATORVASTATIN CALCIUM 20 MG PO TABS
20.0000 mg | ORAL_TABLET | Freq: Every day | ORAL | Status: DC
  Administered 2020-01-20 – 2020-01-21 (×2): 20 mg via ORAL
  Filled 2020-01-20 (×2): qty 1

## 2020-01-20 MED ORDER — FAMOTIDINE 20 MG PO TABS
20.0000 mg | ORAL_TABLET | Freq: Every day | ORAL | Status: DC
  Administered 2020-01-20 – 2020-01-22 (×3): 20 mg via ORAL
  Filled 2020-01-20 (×3): qty 1

## 2020-01-20 MED ORDER — METOPROLOL TARTRATE 5 MG/5ML IV SOLN
5.0000 mg | Freq: Once | INTRAVENOUS | Status: AC
  Administered 2020-01-20: 5 mg via INTRAVENOUS
  Filled 2020-01-20: qty 5

## 2020-01-20 MED ORDER — ESCITALOPRAM OXALATE 10 MG PO TABS
20.0000 mg | ORAL_TABLET | Freq: Every day | ORAL | Status: DC
  Administered 2020-01-20 – 2020-01-21 (×2): 20 mg via ORAL
  Filled 2020-01-20 (×3): qty 2

## 2020-01-20 MED ORDER — ASPIRIN 81 MG PO CHEW
81.0000 mg | CHEWABLE_TABLET | Freq: Every day | ORAL | Status: DC
  Administered 2020-01-20 – 2020-01-21 (×2): 81 mg via ORAL
  Filled 2020-01-20 (×3): qty 1

## 2020-01-20 NOTE — Progress Notes (Signed)
Patient has been running consistently in the 1teens since I arrived. Her HR has begun to creep up into the 120s and just now went into the 140s. On the tele monitor it was reading afib and patient does not have a noted history of afib. Notified Comer Locket, The attending MD.

## 2020-01-20 NOTE — Evaluation (Signed)
Physical Therapy Evaluation Patient Details Name: Paula Torres MRN: [id number] DOB: 1950-12-07 Today's Date: 01/20/2020   History of Present Illness  Pt is 69 yo female that presented to ED due to respiratory distress, placed on bipap. Admitted for acute on chronic hypoxemic respiratory failure. PMH CHF, COPD, HTN.    Clinical Impression  Pt alert, seated in recliner at start of session, initially no complaints of pain, but at end of session/during mobility pt did complain of a headache. Per pt at baseline ambulatory in home with AD, rollator for community ambulation, modI/I for ADLs, family assists as needed.  Pt on 3L at rest. Performed sit <> stand modI with RW, able to stand ~13mn without desaturation. Sit <> stand performed a second time, ambulated ~762fwith RW and CGA. Pt steady, safe with RW but did exhibit increased WOB, endorsed SOB and needed prolonged standing break with UE propping. Returned to room and desaturated ~85% on the way, 81% in chair. Unable to recover within 30-45seconds, placed on 4L, and weaned to 3L by end of session, spO2 91%.  Overall the patient demonstrated deficits (see "PT Problem List") that impede the patient's functional abilities, safety, and mobility and would benefit from skilled PT intervention. Recommendation is HHPT with supervision for mobility/OOB to address limitations in activity tolerance and endurance, as well as energy conservation techniques.     Follow Up Recommendations Home health PT;Supervision for mobility/OOB    Equipment Recommendations  None recommended by PT    Recommendations for Other Services       Precautions / Restrictions Precautions Precautions: Fall Precaution Comments: watch O2 Restrictions Weight Bearing Restrictions: No      Mobility  Bed Mobility               General bed mobility comments: deferred, pt up in recliner  Transfers Overall transfer level: Needs assistance Equipment used: Rolling walker  (2 wheeled) Transfers: Sit to/from Stand Sit to Stand: Modified independent (Device/Increase time)         General transfer comment: performed twice, no physical assist needed  Ambulation/Gait   Gait Distance (Feet): 70 Feet Assistive device: Rolling walker (2 wheeled)   Gait velocity: decreased   General Gait Details: Pt steady, safe. needed standing rest break, propped halfway through ambulation. SOB.  Stairs            Wheelchair Mobility    Modified Rankin (Stroke Patients Only)       Balance Overall balance assessment: Needs assistance Sitting-balance support: Feet supported Sitting balance-Leahy Scale: Normal       Standing balance-Leahy Scale: Good Standing balance comment: RW for energy conservation                             Pertinent Vitals/Pain Pain Assessment: Faces Faces Pain Scale: Hurts a little bit Pain Location: Headache with mobility Pain Descriptors / Indicators: Aching Pain Intervention(s): Limited activity within patient's tolerance;Monitored during session    Home Living Family/patient expects to be discharged to:: Private residence Living Arrangements: Spouse/significant other Available Help at Discharge: Family Type of Home: House Home Access: Stairs to enter Entrance Stairs-Rails: Can reach both Entrance Stairs-Number of Steps: 2 Home Layout: One level Home Equipment: Walker - 4 wheels;Cane - single point;Shower seat Additional Comments: O2 at baseline, 3L    Prior Function Level of Independence: Independent         Comments: household ambulator, no AD.with community ambulation pt uses rollator. Family  assists with ADLs/IADLs as needed     Hand Dominance        Extremity/Trunk Assessment   Upper Extremity Assessment Upper Extremity Assessment: Overall WFL for tasks assessed    Lower Extremity Assessment Lower Extremity Assessment: Generalized weakness    Cervical / Trunk Assessment Cervical /  Trunk Assessment: Normal  Communication   Communication: No difficulties  Cognition Arousal/Alertness: Awake/alert Behavior During Therapy: WFL for tasks assessed/performed Overall Cognitive Status: Within Functional Limits for tasks assessed                                        General Comments      Exercises     Assessment/Plan    PT Assessment Patient needs continued PT services  PT Problem List Decreased mobility;Decreased activity tolerance       PT Treatment Interventions DME instruction;Therapeutic exercise;Gait training;Stair training;Balance training;Neuromuscular re-education;Functional mobility training;Therapeutic activities;Patient/family education    PT Goals (Current goals can be found in the Care Plan section)  Acute Rehab PT Goals Patient Stated Goal: to breathe better PT Goal Formulation: With patient Time For Goal Achievement: 02/03/20 Potential to Achieve Goals: Good    Frequency Min 2X/week   Barriers to discharge        Co-evaluation               AM-PAC PT "6 Clicks" Mobility  Outcome Measure Help needed turning from your back to your side while in a flat bed without using bedrails?: A Little Help needed moving from lying on your back to sitting on the side of a flat bed without using bedrails?: A Little Help needed moving to and from a bed to a chair (including a wheelchair)?: None Help needed standing up from a chair using your arms (e.g., wheelchair or bedside chair)?: None Help needed to walk in hospital room?: None Help needed climbing 3-5 steps with a railing? : A Little 6 Click Score: 21    End of Session Equipment Utilized During Treatment: Gait belt;Oxygen(3-4L) Activity Tolerance: Other (comment)(limited by desaturation, SOB) Patient left: in chair;with call bell/phone within reach Nurse Communication: Mobility status PT Visit Diagnosis: Other abnormalities of gait and mobility (R26.89);Muscle weakness  (generalized) (M62.81)    Time: 8657-8469 PT Time Calculation (min) (ACUTE ONLY): 29 min   Charges:   PT Evaluation $PT Eval Low Complexity: 1 Low PT Treatments $Therapeutic Exercise: 8-22 mins        Lieutenant Diego PT, DPT 2:40 PM,01/20/20

## 2020-01-20 NOTE — Progress Notes (Signed)
Called to bedside by nursing as pt with Atrial Fibrillation with RVR, despite no prior history of A-fib.   HR sustaining 115-130.  Pt is normotensive with BP 139/76, RR 20, 91% O2 sat on 3L [place].  Pt denies chest pain or SOB. She does report palpitations.    Order placed for STAT EKG, STAT troponin, TSH, BNP.  Will give 5 mg Metoprolol.  Will obtain 2D Echocardiogram and Cardiology consultation.      Darel Hong, AGACNP-BC [facility] Pager: [phone]

## 2020-01-20 NOTE — Progress Notes (Signed)
Pt has remained stable throughout shift. PT got pt up and walked around the unit, pt used BSC and is currently sitting in recliner. Pts Vitals have remained WNL throughout shift, no complaints of pain. Pt currently on 3L [place] which is her chronic amount at home as well. Report given to [place] on 1C. Pt transported down to 1C.

## 2020-01-20 NOTE — Plan of Care (Signed)
  Problem: Health Behavior/Discharge Planning: Goal: Ability to manage health-related needs will improve Outcome: Progressing   Problem: Clinical Measurements: Goal: Ability to maintain clinical measurements within normal limits will improve Outcome: Progressing Goal: Diagnostic test results will improve Outcome: Progressing Goal: Respiratory complications will improve Outcome: Progressing Goal: Cardiovascular complication will be avoided Outcome: Progressing   Problem: Activity: Goal: Risk for activity intolerance will decrease Outcome: Progressing   Problem: Nutrition: Goal: Adequate nutrition will be maintained Outcome: Progressing   Problem: Elimination: Goal: Will not experience complications related to bowel motility Outcome: Progressing

## 2020-01-20 NOTE — Progress Notes (Signed)
CRITICAL CARE PROGRESS NOTE    Name: Sheetal Lyall MRN: [id number] DOB: 1951-04-18     LOS: 1   SUBJECTIVE FINDINGS & SIGNIFICANT EVENTS   Patient description:  : 69yo female w/ a past medical history of advanced COPD, chronic hypoxemic on 3L/min [place], severe pulmonary hypertension and diastolic heart failure who presented to the ED  w/ progressive dyspnea that began around midnight. Upon arrival to ED via EMS, the patient was tachypneic w/RR in the 40s, oxygen saturation in the 80s and in obvious distress. She was given 72m lasix IVP, 1248mSolumedrol, Duoneb and placed on BIPAP.   Lines / Drains: PIVx2  Cultures / Sepsis markers: PCT- wnl MRSA PCR - negative RVP - negative COVID19 - negative  Antibiotics: Doxycycline 100 po bid   Protocols / Consultants: PCCM , hospitalist    Overnight: 5/19- improved respiratory status, O2 requirement back to home setting of 3L/min   PAST MEDICAL HISTORY   Past Medical History:  Diagnosis Date  . CHF (congestive heart failure) ([facility]  . COPD [place] disease) [place]  . Hypertension      SURGICAL HISTORY   Past Surgical History:  Procedure Laterality Date  . NO PAST SURGERIES    . RIGHT/LEFT HEART CATH AND CORONARY ANGIOGRAPHY Left 12/21/2019   Procedure: RIGHT/LEFT HEART CATH AND CORONARY ANGIOGRAPHY;  Surgeon: ArWellington HampshireMD;  [facility];  Service: Cardiovascular;  Laterality: Left;     FAMILY HISTORY   Family History  Problem Relation Age of Onset  . Hypertension Other      SOCIAL HISTORY   Social History   Tobacco Use  . Smoking status: Former Smoker    Types: Cigarettes    Quit date: 06/03/2017    Years since quitting: 2.6  . Smokeless tobacco: Never Used  Substance Use Topics  .  Alcohol use: Not Currently  . Drug use: Never     MEDICATIONS   Current Medication:  Current Facility-Administered Medications:  .  ambrisentan (LETAIRIS) tablet 5 mg, 5 mg, Oral, Daily, AlLanney GinsFuad, MD, 5 mg at 01/20/20 1035 .  chlorhexidine (PERIDEX) 0.12 % solution 15 mL, 15 mL, Mouth Rinse, BID, AlLanney GinsFuad, MD, 15 mL at 01/20/20 0932 .  Chlorhexidine Gluconate Cloth 2 % PADS 6 each, 6 each, Topical, Daily, AlOttie GlazierMD, 6 each at 01/19/20 0645 .  docusate sodium (COLACE) capsule 100 mg, 100 mg, Oral, BID PRN, JaTonye RoyaltyNP .  doxycycline (VIBRA-TABS) tablet 100 mg, 100 mg, Oral, Q12H, JaTonye RoyaltyNP, 100 mg at 01/20/20 0932 .  furosemide (LASIX) injection 40 mg, 40 mg, Intravenous, Daily, AlLanney GinsFuad, MD, 40 mg at 01/20/20 0932 .  heparin injection 5,000 Units, 5,000 Units, Subcutaneous, Q8H, JaTonye RoyaltyNP, 5,000 Units at 01/20/20 0526 .  insulin aspart (novoLOG) injection 0-15 Units, 0-15 Units, Subcutaneous, TID WC, JaTonye RoyaltyNP, 2 Units at 01/20/20 1238 .  insulin aspart (novoLOG) injection 0-5 Units, 0-5 Units, Subcutaneous, QHS, JaTonye RoyaltyNP .  ipratropium-albuterol (DUONEB) 0.5-2.5 (3) MG/3ML nebulizer solution 3 mL, 3 mL, Nebulization, Q6H, JaTonye RoyaltyNP, 3 mL at 01/20/20 0837 .  MEDLINE mouth rinse, 15 mL, Mouth Rinse, q12n4p, Taran Hable, MD, 15 mL at 01/20/20 1239 .  ondansetron (ZOFRAN) injection 4 mg, 4 mg, Intravenous, Q6H PRN, JaTonye RoyaltyNP .  polyethylene glycol (MIRALAX / GLYCOLAX) packet 17 g, 17 g, Oral, Daily PRN, JaTonye RoyaltyNP .  predniSONE (DELTASONE) tablet 40 mg, 40 mg,  Oral, Q breakfast, Tonye Royalty, NP, 40 mg at 01/20/20 9373    ALLERGIES   Codeine and Sulfa antibiotics    REVIEW OF SYSTEMS   10 point ROS negative except physical weakness  PHYSICAL EXAMINATION   Vital Signs: Temp:  [97.9 F (36.6 C)-99.2 F (37.3 C)] 97.9 F (36.6 C) (05/19 0738) Pulse Rate:   [70-91] 74 (05/19 1200) Resp:  [16-29] 26 (05/19 1200) BP: (105-131)/(50-81) 125/75 (05/19 1200) SpO2:  [90 %-97 %] 90 % (05/19 1200) Weight:  [121.8 kg] 121.8 kg (05/19 0500)  GENERAL:chrnoically ill appearing HEAD: Normocephalic, atraumatic.  EYES: Pupils equal, round, reactive to light.  No scleral icterus.  MOUTH: Moist mucosal membrane. NECK: Supple. No thyromegaly. No nodules. No JVD.  PULMONARY: mild bilateral rhonchorous breath sounds CARDIOVASCULAR: S1 and S2. Regular rate and rhythm. No murmurs, rubs, or gallops.  GASTROINTESTINAL: Soft, nontender, non-distended. No masses. Positive bowel sounds. No hepatosplenomegaly.  MUSCULOSKELETAL: No swelling, clubbing, or edema.  NEUROLOGIC: Mild distress due to acute illness SKIN:intact,warm,dry   PERTINENT DATA     Infusions:  Scheduled Medications: . ambrisentan  5 mg Oral Daily  . chlorhexidine  15 mL Mouth Rinse BID  . Chlorhexidine Gluconate Cloth  6 each Topical Daily  . doxycycline  100 mg Oral Q12H  . furosemide  40 mg Intravenous Daily  . heparin  5,000 Units Subcutaneous Q8H  . insulin aspart  0-15 Units Subcutaneous TID WC  . insulin aspart  0-5 Units Subcutaneous QHS  . ipratropium-albuterol  3 mL Nebulization Q6H  . mouth rinse  15 mL Mouth Rinse q12n4p  . predniSONE  40 mg Oral Q breakfast   PRN Medications: docusate sodium, ondansetron (ZOFRAN) IV, polyethylene glycol Hemodynamic parameters:   Intake/Output: 05/18 0701 - 05/19 0700 In: 473 [P.O.:473] Out: 1650 [Urine:1650]  Ventilator  Settings:     LAB RESULTS:  Basic Metabolic Panel: Recent Labs  Lab 01/19/20 0433 01/19/20 0433 01/19/20 1346 01/20/20 0432  NA 141  --  141 141  K 4.2   < > 4.3 4.5  CL 99  --  94* 93*  CO2 36*  --  34* 36*  GLUCOSE 198*  --  166* 133*  BUN 16  --  18 30*  CREATININE 0.76  --  0.91 0.83  CALCIUM 8.9  --  9.4 9.3   < > = values in this interval not displayed.   Liver Function Tests: Recent Labs  Lab  01/19/20 0433  AST 15  ALT 14  ALKPHOS 84  BILITOT 0.7  PROT 7.2  ALBUMIN 3.9   No results for input(s): LIPASE, AMYLASE in the last 168 hours. No results for input(s): AMMONIA in the last 168 hours. CBC: Recent Labs  Lab 01/19/20 0433 01/20/20 0432  WBC 17.5* 15.5*  NEUTROABS 14.4*  --   HGB 12.2 11.1*  HCT 40.6 36.5  MCV 91.6 90.8  PLT 332 298   Cardiac Enzymes: No results for input(s): CKTOTAL, CKMB, CKMBINDEX, TROPONINI in the last 168 hours. BNP: Invalid input(s): POCBNP CBG: Recent Labs  Lab 01/19/20 1122 01/19/20 1648 01/19/20 2250 01/20/20 0714 01/20/20 1120  GLUCAP 169* 137* 153* 129* 133*       IMAGING RESULTS:  Imaging: DG Chest Port 1 View  Result Date: 01/20/2020 CLINICAL DATA:  Respiratory failure. EXAM: PORTABLE CHEST 1 VIEW COMPARISON:  01/19/2020 FINDINGS: The heart is borderline enlarged. The mediastinal and hilar contours are stable. Stable underlying emphysematous changes and streaky bibasilar atelectasis. Mild central vascular congestion without  pulmonary edema. No definite infiltrates or effusions. The bony thorax is intact. IMPRESSION: Cardiac enlargement and mild central vascular congestion without overt pulmonary edema. Electronically Signed   By: Marijo Sanes M.D.   On: 01/20/2020 05:53   DG Chest Port 1 View  Result Date: 01/19/2020 CLINICAL DATA:  Dyspnea EXAM: PORTABLE CHEST 1 VIEW COMPARISON:  01/08/2020 FINDINGS: Diffuse interstitial prominence with probable cephalized blood flow. No Kerley line or air bronchogram. No effusion or clear pneumothorax. IMPRESSION: Interstitial prominence favoring vascular congestion Electronically Signed   By: Monte Fantasia M.D.   On: 01/19/2020 05:07   _0 @ DG Chest Port 1 View  Result Date: 01/20/2020 CLINICAL DATA:  Respiratory failure. EXAM: PORTABLE CHEST 1 VIEW COMPARISON:  01/19/2020 FINDINGS: The heart is borderline enlarged. The mediastinal and hilar contours are stable. Stable  underlying emphysematous changes and streaky bibasilar atelectasis. Mild central vascular congestion without pulmonary edema. No definite infiltrates or effusions. The bony thorax is intact. IMPRESSION: Cardiac enlargement and mild central vascular congestion without overt pulmonary edema. Electronically Signed   By: Marijo Sanes M.D.   On: 01/20/2020 05:53     ASSESSMENT AND PLAN    -Multidisciplinary rounds held today Acute on chronic hypoxemic respiratory failure   -NIV BIPAP wean O2 as able   - please give BIPAP breaks for nourishment   -Respiratory viral panel     Acute severe COPD exacerbation  - COPD care path   - DuoNeb q6h  - IV solumedrol >>prednisone 40  - Bronchopulmonary hygiene  - IS at bedside   -Flutter valve  - PT/OT  -patient has Trilogy NIV device at home- may use bipap QHS while hospitalized - RT for settings   Severe pulmonary hypertension    - precapillary Group I and 3   - s/p RHC -Dr Fletcher Anon   - Letaris 73m po daily    -Tyvasso ordered on outpatient - awaiting arrival    -currently NYHA 3    -BNP - 66 on this admission   Obstructive sleep apnea   - QHS CPAP - RT for settings   GI/Nutrition GI PROPHYLAXIS as indicated DIET-->TF's as tolerated Constipation protocol as indicated  ENDO - ICU hypoglycemic\Hyperglycemia protocol -check FSBS per protocol   ELECTROLYTES -follow labs as needed -replace as needed -pharmacy consultation   DVT/GI PRX ordered -SCDs  TRANSFUSIONS AS NEEDED MONITOR FSBS ASSESS the need for LABS as needed   Critical care provider statement:   Critical care time (minutes): 33  Critical care time was exclusive of: Separately billable procedures and  treating other patients  Critical care was necessary to treat or prevent imminent or  life-threatening deterioration of the following conditions: acute on chronic hypoxemic respiratory failure, severe pulmonary htn, AECOPD, OSA  Critical care was time  spent personally by me on the following  activities: Development of treatment plan with patient or surrogate,  discussions with consultants, evaluation of patient's response to  treatment, examination of patient, obtaining history from patient or  surrogate, ordering and performing treatments and interventions, ordering  and review of laboratory studies and re-evaluation of patient's condition  I assumed direction of critical care for this patient from another  provider in my specialty: no    This document was prepared using Dragon voice recognition software and may include unintentional dictation errors.    FOttie Glazier M.D.  Division of [facility]

## 2020-01-20 NOTE — Progress Notes (Signed)
ICU NP came and evaluated patient and placed orders. Patient is also now a yellow mews. Protocol put in place.

## 2020-01-20 NOTE — Plan of Care (Signed)
  Problem: Health Behavior/Discharge Planning: Goal: Ability to manage health-related needs will improve Outcome: Progressing   Problem: Clinical Measurements: Goal: Ability to maintain clinical measurements within normal limits will improve Outcome: Progressing Goal: Diagnostic test results will improve Outcome: Progressing Goal: Respiratory complications will improve Outcome: Progressing Goal: Cardiovascular complication will be avoided Outcome: Progressing

## 2020-01-20 NOTE — Progress Notes (Signed)
Per notes, patient may have a Trilogy at home (set up on previous admission). CSW reached out to [employer] to confirm this. Waiting to hear back.  Oleh Genin, [place]

## 2020-01-21 ENCOUNTER — Ambulatory Visit: Payer: PRIVATE HEALTH INSURANCE | Admitting: Cardiovascular Disease

## 2020-01-21 ENCOUNTER — Inpatient Hospital Stay (HOSPITAL_COMMUNITY)
Admit: 2020-01-21 | Discharge: 2020-01-21 | Disposition: A | Discharge status: Home / Self Care | Payer: Medicare Other | Attending: Pulmonary Disease | Admitting: Pulmonary Disease

## 2020-01-21 DIAGNOSIS — J441 Chronic obstructive pulmonary disease with (acute) exacerbation: Secondary | ICD-10-CM

## 2020-01-21 DIAGNOSIS — I2721 Secondary pulmonary arterial hypertension: Secondary | ICD-10-CM | POA: Diagnosis present

## 2020-01-21 DIAGNOSIS — E668 Other obesity: Secondary | ICD-10-CM

## 2020-01-21 DIAGNOSIS — I5033 Acute on chronic diastolic (congestive) heart failure: Secondary | ICD-10-CM

## 2020-01-21 DIAGNOSIS — I4891 Unspecified atrial fibrillation: Secondary | ICD-10-CM

## 2020-01-21 DIAGNOSIS — J9602 Acute respiratory failure with hypercapnia: Secondary | ICD-10-CM

## 2020-01-21 DIAGNOSIS — G4733 Obstructive sleep apnea (adult) (pediatric): Secondary | ICD-10-CM

## 2020-01-21 DIAGNOSIS — J9601 Acute respiratory failure with hypoxia: Secondary | ICD-10-CM

## 2020-01-21 LAB — ECHOCARDIOGRAM COMPLETE
Height: 65 in
Weight: 4204.61 oz

## 2020-01-21 LAB — BASIC METABOLIC PANEL
Anion gap: 11 (ref 5–15)
BUN: 36 mg/dL — ABNORMAL HIGH (ref 8–23)
CO2: 34 mmol/L — ABNORMAL HIGH (ref 22–32)
Calcium: 8.7 mg/dL — ABNORMAL LOW (ref 8.9–10.3)
Chloride: 93 mmol/L — ABNORMAL LOW (ref 98–111)
Creatinine, Ser: 1.02 mg/dL — ABNORMAL HIGH (ref 0.44–1.00)
GFR calc Af Amer: >60 mL/min (ref >60)
GFR calc non Af Amer: 56 mL/min — ABNORMAL LOW (ref >60)
Glucose, Bld: 149 mg/dL — ABNORMAL HIGH (ref 70–99)
Potassium: 3.7 mmol/L (ref 3.5–5.1)
Sodium: 138 mmol/L (ref 135–145)

## 2020-01-21 LAB — CBC
HCT: 38.5 % (ref 36.0–46.0)
Hemoglobin: 12 g/dL (ref 12.0–15.0)
MCH: 27.8 pg (ref 26.0–34.0)
MCHC: 31.2 g/dL (ref 30.0–36.0)
MCV: 89.1 fL (ref 80.0–100.0)
Platelets: 290 10*3/uL (ref 150–400)
RBC: 4.32 MIL/uL (ref 3.87–5.11)
RDW: 15.4 % (ref 11.5–15.5)
WBC: 13.5 10*3/uL — ABNORMAL HIGH (ref 4.0–10.5)
nRBC: 0 % (ref 0.0–0.2)

## 2020-01-21 LAB — GLUCOSE, CAPILLARY
Glucose-Capillary: 102 mg/dL — ABNORMAL HIGH (ref 70–99)
Glucose-Capillary: 141 mg/dL — ABNORMAL HIGH (ref 70–99)
Glucose-Capillary: 193 mg/dL — ABNORMAL HIGH (ref 70–99)
Glucose-Capillary: 114 mg/dL — ABNORMAL HIGH (ref 70–99)

## 2020-01-21 LAB — TSH: TSH: 1.891 u[IU]/mL (ref 0.350–4.500)

## 2020-01-21 LAB — PROTIME-INR
INR: 0.9 (ref 0.8–1.2)
Prothrombin Time: 12 seconds (ref 11.4–15.2)

## 2020-01-21 LAB — HEPARIN LEVEL (UNFRACTIONATED)
Heparin Unfractionated: <0.1 IU/mL — ABNORMAL LOW (ref 0.30–0.70)
Heparin Unfractionated: 0.53 IU/mL (ref 0.30–0.70)

## 2020-01-21 LAB — APTT: aPTT: 41 seconds — ABNORMAL HIGH (ref 24–36)

## 2020-01-21 LAB — TROPONIN I (HIGH SENSITIVITY): Troponin I (High Sensitivity): 15 ng/L (ref <18)

## 2020-01-21 LAB — MAGNESIUM: Magnesium: 2.1 mg/dL (ref 1.7–2.4)

## 2020-01-21 LAB — BRAIN NATRIURETIC PEPTIDE: B Natriuretic Peptide: 86.8 pg/mL (ref 0.0–100.0)

## 2020-01-21 MED ORDER — HEPARIN BOLUS VIA INFUSION
2000.0000 [IU] | Freq: Once | INTRAVENOUS | Status: AC
  Administered 2020-01-21: 2000 [IU] via INTRAVENOUS
  Filled 2020-01-21: qty 2000

## 2020-01-21 MED ORDER — HEPARIN BOLUS VIA INFUSION
2500.0000 [IU] | Freq: Once | INTRAVENOUS | Status: AC
  Administered 2020-01-21: 2500 [IU] via INTRAVENOUS
  Filled 2020-01-21: qty 2500

## 2020-01-21 MED ORDER — METOPROLOL TARTRATE 5 MG/5ML IV SOLN: 2.5000 mg | Freq: Four times a day (QID) | INTRAVENOUS | Status: DC | PRN

## 2020-01-21 MED ORDER — DIGOXIN 0.25 MG/ML IJ SOLN
0.2500 mg | INTRAMUSCULAR | Status: DC
  Administered 2020-01-21: 0.25 mg via INTRAVENOUS
  Filled 2020-01-21: qty 2

## 2020-01-21 MED ORDER — HEPARIN BOLUS VIA INFUSION: 4000.0000 [IU] | Freq: Once | INTRAVENOUS | Status: DC

## 2020-01-21 MED ORDER — POTASSIUM CHLORIDE CRYS ER 20 MEQ PO TBCR
40.0000 meq | EXTENDED_RELEASE_TABLET | Freq: Once | ORAL | Status: AC
  Administered 2020-01-21: 40 meq via ORAL
  Filled 2020-01-21: qty 2

## 2020-01-21 MED ORDER — AMIODARONE HCL IN DEXTROSE 360-4.14 MG/200ML-% IV SOLN
60.0000 mg/h | INTRAVENOUS | Status: AC
  Administered 2020-01-21 (×2): 60 mg/h via INTRAVENOUS
  Filled 2020-01-21: qty 200

## 2020-01-21 MED ORDER — LEVALBUTEROL HCL 0.63 MG/3ML IN NEBU: 0.6300 mg | INHALATION_SOLUTION | Freq: Four times a day (QID) | RESPIRATORY_TRACT | Status: DC | PRN

## 2020-01-21 MED ORDER — MAGNESIUM SULFATE 2 GM/50ML IV SOLN
2.0000 g | Freq: Once | INTRAVENOUS | Status: AC
  Administered 2020-01-21: 2 g via INTRAVENOUS
  Filled 2020-01-21: qty 50

## 2020-01-21 MED ORDER — AMIODARONE HCL IN DEXTROSE 360-4.14 MG/200ML-% IV SOLN
30.0000 mg/h | INTRAVENOUS | Status: DC
  Administered 2020-01-21: 60 mg/h via INTRAVENOUS
  Administered 2020-01-22: 30 mg/h via INTRAVENOUS
  Filled 2020-01-21 (×3): qty 200

## 2020-01-21 MED ORDER — HEPARIN (PORCINE) 25000 UT/250ML-% IV SOLN
1450.0000 [IU]/h | INTRAVENOUS | Status: DC
  Administered 2020-01-21: 1450 [IU]/h via INTRAVENOUS
  Administered 2020-01-21: 1150 [IU]/h via INTRAVENOUS
  Filled 2020-01-21 (×2): qty 250

## 2020-01-21 MED ORDER — SODIUM CHLORIDE 0.9% FLUSH: 10.0000 mL | INTRAVENOUS | Status: DC | PRN

## 2020-01-21 MED ORDER — AMIODARONE IV BOLUS ONLY 150 MG/100ML
150.0000 mg | Freq: Once | INTRAVENOUS | Status: AC
  Administered 2020-01-21: 150 mg via INTRAVENOUS
  Filled 2020-01-21 (×2): qty 100

## 2020-01-21 NOTE — Progress Notes (Signed)
ANTICOAGULATION CONSULT NOTE - Initial Consult  Pharmacy Consult for heparin Indication: atrial fibrillation  Allergies  Allergen Reactions  . Codeine     Back Headache  . Sulfa Antibiotics     Reported to pt from mother who is no longer living -  Details unknown    Patient Measurements: Height: _0  (165.1 cm) Weight: 119.2 kg (262 lb 12.6 oz) IBW/kg (Calculated) : 57 Heparin Dosing Weight: 82 kg  Vital Signs: Temp: 98.4 F (36.9 C) (05/20 0230) Temp Source: Axillary (05/20 0230) BP: 105/66 (05/20 0300) Pulse Rate: 117 (05/20 0300)  Labs: Recent Labs    01/19/20 0433 01/19/20 1346 01/20/20 0432 01/20/20 2335  HGB 12.2  --  11.1*  --   HCT 40.6  --  36.5  --   PLT 332  --  298  --   CREATININE 0.76 0.91 0.83  --   TROPONINIHS  --   --   --  15    Estimated Creatinine Clearance: 83.9 mL/min (by C-G formula based on SCr of 0.83 mg/dL).   Medical History: Past Medical History:  Diagnosis Date  . CHF (congestive heart failure) ([place])   . COPD (chronic obstructive pulmonary disease) ([place])   . Hypertension     Medications:  Scheduled:  . ambrisentan  5 mg Oral Daily  . aspirin  81 mg Oral Daily  . atorvastatin  20 mg Oral QHS  . chlorhexidine  15 mL Mouth Rinse BID  . Chlorhexidine Gluconate Cloth  6 each Topical Daily  . doxycycline  100 mg Oral Q12H  . escitalopram  20 mg Oral QHS  . famotidine  20 mg Oral Daily  . furosemide  40 mg Intravenous Daily  . heparin  2,000 Units Intravenous Once  . insulin aspart  0-15 Units Subcutaneous TID WC  . insulin aspart  0-5 Units Subcutaneous QHS  . ipratropium-albuterol  3 mL Nebulization Q6H  . mouth rinse  15 mL Mouth Rinse q12n4p  . predniSONE  40 mg Oral Q breakfast    Assessment: Patient admitted x SOB/tachypnea w/ h/o COPD, CHF, HTN presenting w/ afib w/ RVR on EKG, last troponin on this admit was WNL, baseline CBC WNL, aPTT/INR pending. Patient is not on any anticoagulation PTA; however, has received 4  doses of subq heparin for VTE prophylaxis, which has now been d/c'd. Patient is being started on heparin drip for anticoagulation of new onset afib w/ CHADS-VASc = 4.  Goal of Therapy:  Heparin level 0.3-0.7 units/ml Monitor platelets by anticoagulation protocol: Yes   Plan:  Since patient has gotten a few doses of subq heparin will administer a half-bolus of heparin 2000 units IV x 1. Will start rate at 1150 units/hr and check anti-Xa level at 1100. Will f/u on baseline aPTT/INR and will monitor daily CBC's and adjust per anti-Xa levels.  Tobie Lords, PharmD, BCPS Clinical Pharmacist 01/21/2020,3:19 AM

## 2020-01-21 NOTE — Progress Notes (Addendum)
Patient has been sustaining a pulse rate in the 120-130s for approximately 5 minutes. NP Dewaine Conger notified. Requested that blood pressure be reassessed and he would be over to evaluate the patient. Blood pressure as noted. NP Dewaine Conger at beside. Plan to transfer patient to stepdown to start an Amiodarone infusion. Patient updated on plan of care. Charge RN notified of plan.     01/21/20 0120  Assess: MEWS Score  BP (!) 97/57  Pulse Rate (!) 121  ECG Heart Rate (!) 118  Resp (!) 27  SpO2 94 %  Assess: MEWS Score  MEWS Temp 0  MEWS Systolic 1  MEWS Pulse 2  MEWS RR 2  MEWS LOC 0  MEWS Score 5  MEWS Score Color Red  Assess: if the MEWS score is Yellow or Red  Were vital signs taken at a resting state? Yes  Focused Assessment Documented focused assessment  Treat  MEWS Interventions Other (Comment) (NP notified)  Take Vital Signs  Increase Vital Sign Frequency  Red: Q 1hr X 4 then Q 4hr X 4, if remains red, continue Q 4hrs  Escalate  MEWS: Escalate Red: discuss with charge nurse/RN and provider, consider discussing with RRT  Notify: Charge Nurse/RN  Name of Charge Nurse/RN Notified Phyllis, RN  Date Charge Nurse/RN Notified 01/21/20  Notify: Provider  Provider Name/Title Darel Hong, NP  Date Provider Notified 01/21/20  Time Provider Notified 0120  Notification Type Face-to-face  Notification Reason Change in status  Response See new orders (Transfer back to steipdown)  Date of Provider Response 01/21/20  Time of Provider Response 0124  Document  Patient Outcome Transferred/level of care increased

## 2020-01-21 NOTE — Progress Notes (Signed)
    She converted to sinus rhythm with heart rates in the 70s bpm at 12:29 PM this afternoon without post termination pause following one dose of IV digoxin and continuation of IV amiodarone. Will cancel the 2nd dose of digoxin, cancel DCCV for 5/21 and reduce her IV amiodarone to 30 mg/hr with plans for PO on 5/21.

## 2020-01-21 NOTE — Progress Notes (Signed)
Pt's HR initially responded to 5 mg metoprolol, but pt again with sustained HR 130's, and now BP soft with BP 97/57 (69).  Pt remains asymptomatic.  Will transfer pt back to [facility] unit for Amiodarone bolus.    Darel Hong, AGACNP-BC [facility] Pager: [phone]

## 2020-01-21 NOTE — Progress Notes (Signed)
ANTICOAGULATION CONSULT NOTE  Pharmacy Consult for heparin Indication: atrial fibrillation  Patient Measurements: Height: _0  (165.1 cm) Weight: 119.2 kg (262 lb 12.6 oz) IBW/kg (Calculated) : 57 Heparin Dosing Weight: 82 kg  Vital Signs: Temp: 98.4 F (36.9 C) (05/20 0230) Temp Source: Axillary (05/20 0230) BP: 126/67 (05/20 0500) Pulse Rate: 59 (05/20 0600)  Labs: Recent Labs    01/19/20 0433 01/19/20 1346 01/20/20 0432 01/20/20 2335  HGB 12.2  --  11.1*  --   HCT 40.6  --  36.5  --   PLT 332  --  298  --   CREATININE 0.76 0.91 0.83  --   TROPONINIHS  --   --   --  15    Estimated Creatinine Clearance: 83.9 mL/min (by C-G formula based on SCr of 0.83 mg/dL).   Medical History: Past Medical History:  Diagnosis Date  . CHF (congestive heart failure) ([place])   . COPD (chronic obstructive pulmonary disease) ([place])   . Hypertension     Medications:  Scheduled:  . ambrisentan  5 mg Oral Daily  . aspirin  81 mg Oral Daily  . atorvastatin  20 mg Oral QHS  . chlorhexidine  15 mL Mouth Rinse BID  . Chlorhexidine Gluconate Cloth  6 each Topical Daily  . doxycycline  100 mg Oral Q12H  . escitalopram  20 mg Oral QHS  . famotidine  20 mg Oral Daily  . furosemide  40 mg Intravenous Daily  . insulin aspart  0-15 Units Subcutaneous TID WC  . insulin aspart  0-5 Units Subcutaneous QHS  . ipratropium-albuterol  3 mL Nebulization Q6H  . mouth rinse  15 mL Mouth Rinse q12n4p  . predniSONE  40 mg Oral Q breakfast    Assessment: Patient admitted x SOB/tachypnea w/ h/o COPD, CHF, HTN presenting w/ afib w/ RVR on EKG, last troponin on this admit was WNL, baseline CBC WNL, aPTT/INR pending. Patient is not on any anticoagulation PTA. Patient is being started on heparin drip for anticoagulation of new onset afib w/ CHADS-VASc = 4. H&H, platelets wnl  Heparin Course: 5/20 initiation: 2000 unit bolus, then 1150 units/hr 5/20 1050 HL<0.10:   Goal of Therapy:  Heparin level  0.3-0.7 units/ml Monitor platelets by anticoagulation protocol: Yes   Plan:   Bolus heparin 2500 units IV x 1  Increase infusion rate to 1450 units/hr  Re-check anti-Xa level 6 hours after rate change  monitor daily CBC's and adjust per anti-Xa levels.  Vallery Sa, PharmD, BCPS Clinical Pharmacist 01/21/2020,7:13 AM

## 2020-01-21 NOTE — Progress Notes (Addendum)
Midmorning pt was afib and pt developed several runs of asymptomatic vtach along with hr going up into the 190's while on the bsc. Got pt back in bed and pt remained in afib through most of remaining shift. Pt did convert out of afib on her own currently in NSR with occasional pvcs. Dr. Rockey Situ made aware and he changed amiodarone drip from 60 to 30 and d/c's digoxin. Pts family has visited and that seemed to really help pts anxiety. Lungs have remained clear but diminished, good urine output, AOx4 and currently sitting in bed watching tv laughing can be heard from out in the hallway.

## 2020-01-21 NOTE — Progress Notes (Signed)
[facility]  PROGRESS NOTE    Paula Torres  VJD:[id number] DOB: 1951/01/19 DOA: 01/19/2020 PCP: Rusty Aus, MD   Brief Narrative: Patient is a 69 year old female with history of COPD, severe pulmonary hypertension, chronic hypoxemic respiratory failure on 3 L oxygen, chronic diastolic congestive heart failure, obstructive sleep apnea who was initially come to the emergency room on 5/18 with complaints of shortness of breath.  Her chest x-ray showed vascular congestion, she was giving steroids and IV Lasix, she was requiring BiPAP in ICU.  Her condition improved 5/19, she was transferred to the medical floor.  While in the medical floor, she developed atrial fibrillation with rapid ventricle response.  She was sent back to stepdown unit, given metoprolol IV and amiodarone.  She is also placed on heparin drip. Assessment & Plan:   Active Problems:   COPD with acute exacerbation ([place])  #1.  Acute on chronic hypoxemic respiratory failure. Patient was chronically on 3 L oxygen.  This is a secondary to COPD exacerbation, acute on chronic diastolic congestive heart failure and severe pulmonary hypertension.  Oxygenation is better.  Continue current treatment.  2.  COPD exacerbation. Initially received Solu-Medrol, currently on prednisone.  She is also receiving doxycycline.  Currently she does not have any bronchospasm.  3.  Acute on chronic diastolic congestive heart failure with a severe pulmonary hypertension. Patient still on IV Lasix, will continue for one more day.  4.  New onset atrial fibrillation with rapid ventricular response. Discussed with cardiology, patient will be seen today.  Continue heparin drip, continue amnio.  Heart rate is better this morning.  5.  Obstructive sleep apnea. CPAP while asleep.       DVT prophylaxis: Heparin Code Status: Full Family Communication: None Disposition Plan:  . Patient came from: Home           . Anticipated d/c place: Home . Barriers to  d/c OR conditions which need to be met to effect a safe d/c:  None Consultants:   Pulmonology  Cardiology  Procedures: None Antimicrobials: Doxycycline.  Subjective: Patient develop atrial fibrillation with rapid ventricle response overnight.  Currently patient has some short of breath with exertion, no sensation of palpitation. Denies any nausea vomiting abdominal pain No fever or chills No dysuria hematuria.  Objective: Vitals:   01/21/20 0500 01/21/20 0600 01/21/20 0700 01/21/20 0836  BP: 126/67  120/69 121/72  Pulse: 98 (!) 59 (!) 118 (!) 120  Resp: (!) _0 Temp:    98.4 F (36.9 C)  TempSrc:    Oral  SpO2: 94% 94% 95%   Weight:      Height:        Intake/Output Summary (Last 24 hours) at 01/21/2020 0856 Last data filed at 01/20/2020 1300 Gross per 24 hour  Intake 360 ml  Output 1100 ml  Net -740 ml   Filed Weights   01/19/20 0455 01/20/20 0500 01/21/20 0230  Weight: 118.8 kg 121.8 kg 119.2 kg    Examination:  General exam: Appears calm and comfortable  Respiratory system: Clear to auscultation. Respiratory effort normal. Cardiovascular system: Irregularly irregular with tachycardia, no JVD, murmurs, rubs, gallops or clicks. No pedal edema. Gastrointestinal system: Abdomen is nondistended, soft and nontender. No organomegaly or masses felt. Normal bowel sounds heard. Central nervous system: Alert and oriented. No focal neurological deficits. Extremities: Symmetric  Skin: No rashes, lesions or ulcers Psychiatry: Judgement and insight appear normal. Mood & affect appropriate.     Data Reviewed: I  have personally reviewed following labs and imaging studies  CBC: Recent Labs  Lab 01/19/20 0433 01/20/20 0432  WBC 17.5* 15.5*  NEUTROABS 14.4*  --   HGB 12.2 11.1*  HCT 40.6 36.5  MCV 91.6 90.8  PLT 332 830   Basic Metabolic Panel: Recent Labs  Lab 01/19/20 0433 01/19/20 1346 01/20/20 0432  NA 141 141 141  K 4.2 4.3 4.5  CL 99 94* 93*    CO2 36* 34* 36*  GLUCOSE 198* 166* 133*  BUN 16 18 30*  CREATININE 0.76 0.91 0.83  CALCIUM 8.9 9.4 9.3   GFR: Estimated Creatinine Clearance: 83.9 mL/min (by C-G formula based on SCr of 0.83 mg/dL). Liver Function Tests: Recent Labs  Lab 01/19/20 0433  AST 15  ALT 14  ALKPHOS 84  BILITOT 0.7  PROT 7.2  ALBUMIN 3.9   No results for input(s): LIPASE, AMYLASE in the last 168 hours. No results for input(s): AMMONIA in the last 168 hours. Coagulation Profile: No results for input(s): INR, PROTIME in the last 168 hours. Cardiac Enzymes: No results for input(s): CKTOTAL, CKMB, CKMBINDEX, TROPONINI in the last 168 hours. BNP (last 3 results) No results for input(s): PROBNP in the last 8760 hours. HbA1C: Recent Labs    01/19/20 0837  HGBA1C 5.7*   CBG: Recent Labs  Lab 01/20/20 0714 01/20/20 1120 01/20/20 1556 01/20/20 2033 01/21/20 0741  GLUCAP 129* 133* 141* 172* 102*   Lipid Profile: No results for input(s): CHOL, HDL, LDLCALC, TRIG, CHOLHDL, LDLDIRECT in the last 72 hours. Thyroid Function Tests: Recent Labs    01/20/20 2335  TSH 1.891   Anemia Panel: No results for input(s): VITAMINB12, FOLATE, FERRITIN, TIBC, IRON, RETICCTPCT in the last 72 hours. Sepsis Labs: Recent Labs  Lab 01/19/20 0436  PROCALCITON <0.10    Recent Results (from the past 240 hour(s))  SARS Coronavirus 2 by RT PCR (hospital order, performed in [facility] hospital lab) Nasopharyngeal Nasopharyngeal Swab     Status: None   Collection Time: 01/19/20  5:52 AM   Specimen: Nasopharyngeal Swab  Result Value Ref Range Status   SARS Coronavirus 2 NEGATIVE NEGATIVE Final    Comment: (NOTE) SARS-CoV-2 target nucleic acids are NOT DETECTED. The SARS-CoV-2 RNA is generally detectable in upper and lower respiratory specimens during the acute phase of infection. The lowest concentration of SARS-CoV-2 viral copies this assay can detect is 250 copies / mL. A negative result does not preclude  SARS-CoV-2 infection and should not be used as the sole basis for treatment or other patient management decisions.  A negative result may occur with improper specimen collection / handling, submission of specimen other than nasopharyngeal swab, presence of viral mutation(s) within the areas targeted by this assay, and inadequate number of viral copies (<250 copies / mL). A negative result must be combined with clinical observations, patient history, and epidemiological information. Fact Sheet for Patients:   StrictlyIdeas.no Fact Sheet for Healthcare Providers: BankingDealers.co.za This test is not yet approved or cleared  by the [place] FDA and has been authorized for detection and/or diagnosis of SARS-CoV-2 by FDA under an Emergency Use Authorization (EUA).  This EUA will remain in effect (meaning this test can be used) for the duration of the COVID-19 declaration under Section 564(b)(1) of the Act, 21 U.S.C. section 360bbb-3(b)(1), unless the authorization is terminated or revoked sooner. Performed at [facility], [address], [place], [place] [address]   MRSA PCR Screening     Status: None  Collection Time: 01/19/20  7:05 AM   Specimen: Nasopharyngeal  Result Value Ref Range Status   MRSA by PCR NEGATIVE NEGATIVE Final    Comment:        The GeneXpert MRSA Assay (FDA approved for NASAL specimens only), is one component of a comprehensive MRSA colonization surveillance program. It is not intended to diagnose MRSA infection nor to guide or monitor treatment for MRSA infections. Performed at [facility], [address], [place], [place] [address]          Radiology Studies: [facility] Chest [place] 1 View  Result Date: 01/20/2020 CLINICAL DATA:  Respiratory failure. EXAM: PORTABLE CHEST 1 VIEW COMPARISON:  01/19/2020 FINDINGS: The heart is borderline enlarged. The mediastinal and hilar contours are  stable. Stable underlying emphysematous changes and streaky bibasilar atelectasis. Mild central vascular congestion without pulmonary edema. No definite infiltrates or effusions. The bony thorax is intact. IMPRESSION: Cardiac enlargement and mild central vascular congestion without overt pulmonary edema. Electronically Signed   By: Marijo Sanes M.D.   On: 01/20/2020 05:53        Scheduled Meds: . ambrisentan  5 mg Oral Daily  . aspirin  81 mg Oral Daily  . atorvastatin  20 mg Oral QHS  . chlorhexidine  15 mL Mouth Rinse BID  . Chlorhexidine Gluconate Cloth  6 each Topical Daily  . doxycycline  100 mg Oral Q12H  . escitalopram  20 mg Oral QHS  . famotidine  20 mg Oral Daily  . furosemide  40 mg Intravenous Daily  . insulin aspart  0-15 Units Subcutaneous TID WC  . insulin aspart  0-5 Units Subcutaneous QHS  . ipratropium-albuterol  3 mL Nebulization Q6H  . mouth rinse  15 mL Mouth Rinse q12n4p  . predniSONE  40 mg Oral Q breakfast   Continuous Infusions: . amiodarone    . heparin 1,150 Units/hr (01/21/20 0359)     LOS: 2 days    Time spent: 35 minutes    Sharen Hones, MD [facility]   To contact the attending provider between 7A-7P or the covering provider during after hours 7P-7A, please log into the web site [email or web address] and access using universal [place] password for that web site. If you do not have the password, please call the hospital operator.  01/21/2020, 8:56 AM

## 2020-01-21 NOTE — Progress Notes (Signed)
PT Cancellation Note  Patient Details Name: Lean Jaeger MRN: [id number] DOB: [birth year]   Cancelled Treatment:    Reason Eval/Treat Not Completed: Other (comment). Per chart, pt transferred to higher level of care early this AM due to change in medical status (new onset afib with RVR). Pt orders to be completed at this time, please re-consult when patient is medically stable and appropriate for PT re-evaluation.   Lieutenant Diego PT, DPT 11:31 AM,01/21/20

## 2020-01-21 NOTE — Consult Note (Addendum)
PHARMACY CONSULT NOTE - FOLLOW UP  Pharmacy Consult for Electrolyte Monitoring and Replacement   Recent Labs: Potassium (mmol/L)  Date Value  01/21/2020 3.7   Magnesium (mg/dL)  Date Value  01/21/2020 2.1   Calcium (mg/dL)  Date Value  01/21/2020 8.7 (L)   Albumin (g/dL)  Date Value  01/19/2020 3.9   Phosphorus (mg/dL)  Date Value  11/09/2019 3.2   Sodium (mmol/L)  Date Value  01/21/2020 138     Assessment: 69 year old female admitted with COPD and acute CHF, and later developed new onset atrial fibrillation with RVR requiring treatment with digoxin and amiodarone.  She is currently taking medications PO.   Goal of Therapy:  Replace electrolytes to goal potassium ~4, and goal magnesium ~2.  All other electrolytes WNL.  Plan:  Will give KCl 40 mEq x 1 dose.  Plan to check electrolytes with AM labs.  Pharmacy will continue to monitor and adjust per consult.  Gerald Dexter ,PharmD 01/21/2020 2:39 PM

## 2020-01-21 NOTE — Consult Note (Signed)
Cardiology Consultation:   Patient ID: Paula Torres; [id number]; 1950/10/29   Admit date: 01/19/2020 Date of Consult: 01/21/2020  Primary Care Provider: Rusty Aus, MD Primary Cardiologist: Fletcher Anon   Patient Profile:   Paula Torres is a 69 y.o. female with a hx of nonobstructive CAD by LHC in 12/2019, chronic hypoxic respiratory failure on supplemental oxygen at 3 L via nasal cannula, severe pulmonary hypertension, COPD, HTN, morbid obesity, and OSA who is being seen today for the evaluation of Afib with RVR at the request of Mr. Dewaine Conger, NP.  History of Present Illness:   Paula Torres was recently admitted to [facility] in 11/2019 with a COPD exacerbation and acute on chronic HFpEF.  She was noted to have mildly elevated troponin felt to be secondary to supply demand ischemia in the setting of the above.  Echo during that admission showed an EF of 60 to 65%, mild LVH, grade 1 diastolic dysfunction, normal RV systolic function and ventricular cavity size, mildly dilated left atrium, and no significant valvular abnormalities.  During her admission, she was noted to have atrial tachycardia which was treated with bisoprolol.  She subsequently underwent outpatient Lexiscan MPI on 12/08/2019 which was intermediate risk as outlined below with an EF of 59%.  In this setting, she underwent diagnostic R/LHC on 12/21/2019 which showed moderate one-vessel nonobstructive CAD involving the RCA and moderately calcified coronary arteries overall.  Medical management was advised.  RHC showed mildly elevated right and left filling pressures, severe pulmonary hypertension, and mildly reduced cardiac output.  In this setting, she was referred to pulmonology and recently started on vasodilator therapy.  She was admitted to [facility] on 01/19/2020 with acute on chronic hypoxic and hypercarbic respiratory failure in the setting of COPD exacerbation.  She was documented to be in sinus rhythm upon her presentation.  She has been  treated with antibiotics, albuterol nebulizers, and steroids.  She has also received IV Lasix with a documented 1.8 L net negative for the admission.  On the evening of 01/20/2020, she had caffeinated beverages from [employer] along with albuterol nebulizer.  She developed new onset A. fib with RVR around 11:30 PM with ventricular rates ranging from the 1 teens to 130s bpm.  BP was stable.  She was given 5 mg of IV metoprolol without much improvement in her ventricular rates.  In this setting, she was started on a heparin drip and amiodarone bolus/drip.  Labs at that time showed a normal TSH and a potassium of 4.5.  This morning, she remains in A. fib with RVR with ventricular rates ranging from the low 100s to 120s bpm.  She is asymptomatic and feels back to her baseline.  Echo is pending.  Follow-up labs this morning pending.  Past Medical History:  Diagnosis Date  . CHF (congestive heart failure) ([place])   . COPD (chronic obstructive pulmonary disease) ([place])   . Hypertension     Past Surgical History:  Procedure Laterality Date  . NO PAST SURGERIES    . RIGHT/LEFT HEART CATH AND CORONARY ANGIOGRAPHY Left 12/21/2019   Procedure: RIGHT/LEFT HEART CATH AND CORONARY ANGIOGRAPHY;  Surgeon: Wellington Hampshire, MD;  Location: [place] [facility];  Service: Cardiovascular;  Laterality: Left;     Home Meds: Prior to Admission medications   Medication Sig Start Date End Date Taking? Authorizing Provider  azithromycin (ZITHROMAX) 250 MG tablet Take 250 mg by mouth See admin instructions. Take 1 tablet by mouth as directed for up to  5 days. 01/18/20 01/23/20 Yes [provider]  Treprostinil (TYVASO) 0.6 MG/ML SOLN Inhale 18 mcg into the lungs 4 (four) times daily. 01/11/20 01/10/21 Yes [provider]  albuterol (VENTOLIN HFA) 108 (90 Base) MCG/ACT inhaler Inhale 2 puffs into the lungs every 6 (six) hours as needed for wheezing or shortness of breath.    [provider]  aspirin  EC 81 MG tablet Take 81 mg by mouth daily.    [provider]  atorvastatin (LIPITOR) 20 MG tablet Take 1 tablet (20 mg total) by mouth daily at 6 PM. 11/23/19   Paula Dubonnet, NP  bisoprolol (ZEBETA) 5 MG tablet Take 1 tablet (5 mg total) by mouth daily. 11/23/19   Paula Dubonnet, NP  escitalopram (LEXAPRO) 20 MG tablet Take 20 mg by mouth daily.    [provider]  famotidine (PEPCID) 20 MG tablet Take 1 tablet (20 mg total) by mouth daily for 30 doses. 11/10/19 12/10/19  Nolberto Hanlon, MD  furosemide (LASIX) 40 MG tablet Take 1 tablet (40 mg total) by mouth daily. 11/23/19   Paula Dubonnet, NP  gabapentin (NEURONTIN) 300 MG capsule Take 300 mg by mouth at bedtime.  11/12/19   [provider]  ibuprofen (ADVIL) 200 MG tablet Take 600-800 mg by mouth every 8 (eight) hours as needed (for pain.).    [provider]  Ipratropium-Albuterol (COMBIVENT RESPIMAT) 20-100 MCG/ACT AERS respimat Inhale 1 puff into the lungs every 6 (six) hours as needed (wheezing/shortness of breath.).     [provider]  ipratropium-albuterol (DUONEB) 0.5-2.5 (3) MG/3ML SOLN Take 3 mLs by nebulization every 6 (six) hours as needed (for wheezing/shortness of breath.).     [provider]  metoprolol succinate (TOPROL-XL) 25 MG 24 hr tablet Take 25 mg by mouth 2 (two) times daily. 12/20/19   [provider]  predniSONE (DELTASONE) 20 MG tablet Take 20 mg by mouth daily. 01/18/20 01/23/20  [provider]  rOPINIRole (REQUIP XL) 2 MG 24 hr tablet Take 2 mg by mouth at bedtime.    [provider]  spironolactone (ALDACTONE) 25 MG tablet Take 1 tablet (25 mg total) by mouth daily. 11/23/19   Paula Dubonnet, NP  TRELEGY ELLIPTA 100-62.5-25 MCG/INH AEPB Inhale 1 puff into the lungs daily. 11/20/19   [provider]    Inpatient Medications: Scheduled Meds: . ambrisentan  5 mg Oral Daily  . aspirin  81 mg Oral Daily  . atorvastatin  20 mg  Oral QHS  . chlorhexidine  15 mL Mouth Rinse BID  . Chlorhexidine Gluconate Cloth  6 each Topical Daily  . doxycycline  100 mg Oral Q12H  . escitalopram  20 mg Oral QHS  . famotidine  20 mg Oral Daily  . furosemide  40 mg Intravenous Daily  . insulin aspart  0-15 Units Subcutaneous TID WC  . insulin aspart  0-5 Units Subcutaneous QHS  . ipratropium-albuterol  3 mL Nebulization Q6H  . mouth rinse  15 mL Mouth Rinse q12n4p  . predniSONE  40 mg Oral Q breakfast   Continuous Infusions: . amiodarone    . heparin 1,150 Units/hr (01/21/20 0359)   PRN Meds: docusate sodium, metoprolol tartrate, ondansetron (ZOFRAN) IV, polyethylene glycol, sodium chloride flush  Allergies:   Allergies  Allergen Reactions  . Codeine     Back Headache  . Sulfa Antibiotics     Reported to pt from mother who is no longer living -  Details unknown  Social History:   Social History   Socioeconomic History  . Marital status: Married    Spouse name: Not on file  . Number of children: Not on file  . Years of education: Not on file  . Highest education level: Not on file  Occupational History  . Not on file  Tobacco Use  . Smoking status: Former Smoker    Types: Cigarettes    Quit date: 06/03/2017    Years since quitting: 2.6  . Smokeless tobacco: Never Used  Substance and Sexual Activity  . Alcohol use: Not Currently  . Drug use: Never  . Sexual activity: Not on file  Other Topics Concern  . Not on file  Social History Narrative  . Not on file   Social Determinants of Health   Financial Resource Strain:   . Difficulty of Paying Living Expenses:   Food Insecurity:   . Worried About Charity fundraiser in the Last Year:   . Arboriculturist in the Last Year:   Transportation Needs:   . Film/video editor (Medical):   Marland Kitchen Lack of Transportation (Non-Medical):   Physical Activity:   . Days of Exercise per Week:   . Minutes of Exercise per Session:   Stress:   . Feeling of Stress :     Social Connections:   . Frequency of Communication with Friends and Family:   . Frequency of Social Gatherings with Friends and Family:   . Attends Religious Services:   . Active Member of Clubs or Organizations:   . Attends Archivist Meetings:   Marland Kitchen Marital Status:   Intimate Partner Violence:   . Fear of Current or Ex-Partner:   . Emotionally Abused:   Marland Kitchen Physically Abused:   . Sexually Abused:      Family History:   Family History  Problem Relation Age of Onset  . Hypertension Other     ROS:  Review of Systems  Constitutional: Positive for malaise/fatigue. Negative for chills, diaphoresis, fever and weight loss.  HENT: Negative for congestion.   Eyes: Negative for discharge and redness.  Respiratory: Positive for cough, shortness of breath and wheezing. Negative for sputum production.   Cardiovascular: Negative for chest pain, palpitations, orthopnea, claudication, leg swelling and PND.  Gastrointestinal: Negative for abdominal pain, blood in stool, heartburn, melena, nausea and vomiting.  Musculoskeletal: Negative for falls and myalgias.  Skin: Negative for rash.  Neurological: Positive for weakness. Negative for dizziness, tingling, tremors, sensory change, speech change, focal weakness and loss of consciousness.  Endo/Heme/Allergies: Does not bruise/bleed easily.  Psychiatric/Behavioral: Negative for substance abuse. The patient is not nervous/anxious.   All other systems reviewed and are negative.     Physical Exam/Data:   Vitals:   01/21/20 0500 01/21/20 0600 01/21/20 0700 01/21/20 0836  BP: 126/67  120/69 121/72  Pulse: 98 (!) 59 (!) 118 (!) 120  Resp: (!) _0 Temp:    98.4 F (36.9 C)  TempSrc:    Oral  SpO2: 94% 94% 95%   Weight:      Height:        Intake/Output Summary (Last 24 hours) at 01/21/2020 0920 Last data filed at 01/20/2020 1300 Gross per 24 hour  Intake 240 ml  Output 1100 ml  Net -860 ml   Filed Weights   01/19/20 0455  01/20/20 0500 01/21/20 0230  Weight: 118.8 kg 121.8 kg 119.2 kg   Body mass index is 43.73 kg/m.  Physical Exam: General: Well developed, well nourished, in no acute distress. Head: Normocephalic, atraumatic, sclera non-icteric, no xanthomas, nares without discharge.  Neck: Negative for carotid bruits. JVD difficult to assess secondary to body habitus. Lungs: Coarse, diminished breath sounds bilaterally. Breathing is unlabored. Heart: Tachycardic and irregularly irregular with S1 S2. No murmurs, rubs, or gallops appreciated.  Supplemental oxygen via nasal cannula at 3 L. Abdomen: Soft, non-tender, non-distended with normoactive bowel sounds. No hepatomegaly. No rebound/guarding. No obvious abdominal masses. Msk:  Strength and tone appear normal for age. Extremities: No clubbing or cyanosis. Chronic venous stasis noted.  Distal pedal pulses are 2+ and equal bilaterally. Neuro: Alert and oriented X 3. No facial asymmetry. No focal deficit. Moves all extremities spontaneously. Psych:  Responds to questions appropriately with a normal affect.   EKG:  The EKG was personally reviewed and demonstrates: 01/19/2020 - NSR, 96 bpm, right axis deviation, low voltage along the precordial leads, no acute ST-T changes.  01/20/2020 - A. fib with RVR, 102 bpm, nonspecific ST-T changes Telemetry:  Telemetry was personally reviewed and demonstrates: A. fib with RVR with ventricular rates in the low 100s to 130s bpm  Weights: Filed Weights   01/19/20 0455 01/20/20 0500 01/21/20 0230  Weight: 118.8 kg 121.8 kg 119.2 kg    Relevant CV Studies:  2D echo pending __________  [facility] 12/21/2019:  Ost Cx to Prox Cx lesion is 20% stenosed.  Prox RCA lesion is 40% stenosed.   1.  Moderate one-vessel coronary artery disease involving the right coronary artery.  No evidence of obstructive disease.  Moderately calcified coronary arteries overall. 2.  Left ventricular angiography was not performed.  EF was normal  by echo. 3.  Right heart catheterization showed mildly elevated right and left filling pressures, severe pulmonary hypertension and mildly reduced cardiac output.  The patient developed right bundle branch block as the catheter enters the right ventricle.  This, most of the time is transient.  RA: 12 mmHg. RV: 71 over 6 mmHg Pulmonary capillary wedge pressure: 15 mmHg PA: 75/27 with a mean of 47 mmHg. Fick cardiac output is 4.75 with a cardiac index of 2.14. Pulmonary vascular resistance: 6.74 [place] unit  Recommendations: Recommend medical therapy for nonobstructive coronary artery disease. Right heart catheterization does confirm severe pulmonary hypertension which is likely mixed in etiology but right now appears to be mostly arterial hypertension given the wedge pressure is only 15 mmHg.  Recommend treatment of underlying sleep apnea and will forward this information to pulmonary for further management. __________  2D echo 11/09/2019: 1. Left ventricular ejection fraction, by estimation, is 60 to 65%. The  left ventricle has normal function. The left ventricle has no regional  wall motion abnormalities. There is mild left ventricular hypertrophy.  Left ventricular diastolic parameters  are consistent with Grade I diastolic dysfunction (impaired relaxation).  2. Right ventricular systolic function is normal. The right ventricular  size is normal. Tricuspid regurgitation signal is inadequate for assessing  PA pressure.  3. Left atrial size was mildly dilated.  4. The mitral valve is normal in structure. No evidence of mitral valve  regurgitation. No evidence of mitral stenosis.  5. The aortic valve is normal in structure. Aortic valve regurgitation is  not visualized. No aortic stenosis is present.  6. The inferior vena cava is normal in size with greater than 50%  respiratory variability, suggesting right atrial pressure of 3 mmHg.  Laboratory Data:  Chemistry Recent Labs    Lab 01/19/20 [phone] 01/19/20  1346 01/20/20 0432  NA 141 141 141  K 4.2 4.3 4.5  CL 99 94* 93*  CO2 36* 34* 36*  GLUCOSE 198* 166* 133*  BUN 16 18 30*  CREATININE 0.76 0.91 0.83  CALCIUM 8.9 9.4 9.3  GFRNONAA >60 >60 >60  GFRAA >60 >60 >60  ANIONGAP _0 Recent Labs  Lab 01/19/20 0433  PROT 7.2  ALBUMIN 3.9  AST 15  ALT 14  ALKPHOS 84  BILITOT 0.7   Hematology Recent Labs  Lab 01/19/20 0433 01/20/20 0432  WBC 17.5* 15.5*  RBC 4.43 4.02  HGB 12.2 11.1*  HCT 40.6 36.5  MCV 91.6 90.8  MCH 27.5 27.6  MCHC 30.0 30.4  RDW 15.0 15.1  PLT 332 298   Cardiac EnzymesNo results for input(s): TROPONINI in the last 168 hours. No results for input(s): TROPIPOC in the last 168 hours.  BNP Recent Labs  Lab 01/19/20 0433 01/20/20 2335  BNP 66.0 86.8    DDimer No results for input(s): DDIMER in the last 168 hours.  Radiology/Studies:  DG Chest Port 1 View  Result Date: 01/20/2020 IMPRESSION: Cardiac enlargement and mild central vascular congestion without overt pulmonary edema. Electronically Signed   By: Marijo Sanes M.D.   On: 01/20/2020 05:53   DG Chest Port 1 View  Result Date: 01/19/2020 IMPRESSION: Interstitial prominence favoring vascular congestion Electronically Signed   By: Monte Fantasia M.D.   On: 01/19/2020 05:07    Assessment and Plan:   1. New onset Afib with RVR: -She presented in sinus rhythm on 5/18, developing new onset A. fib with RVR on the evening of 01/20/2020 around 11:30 PM -She remains in A. fib with RVR with ventricular rates in the low 100s to 120s bpm -Possibly in the setting of caffeine intake on the evening of 5/19, albuterol nebulizers, AECOPD, and severe pulmonary hypertension -Long discussion with patient and pulmonology -Continue full dose amiodarone for now, though given her underlying pulmonary disease this is not a great medication long-term -Avoiding beta-blockers in the setting of COPD exacerbation and with prior history  of relative hypotension -Give IV digoxin 0.25 mg every 6 hours for 2 doses -Continue heparin drip with recommendation to transition to Eliquis prior to discharge given a CHA2DS2-VASc of 4 (CHF, HTN, age x 1, gender) -N.p.o. at midnight -Tentatively planning for DCCV on 01/22/2020 if she remains in A. Fib -TSH normal -Potassium at goal on 01/20/2020 -Magnesium pending  2. Acute on chronic hypoxic and hypercapneic respiratory failure: -Respiratory status is improving -Currently at PTA supplemental oxygen at 3 L via nasal cannula -Multifactorial including COPD-patient with severe pulmonary hypertension, morbid obesity with OSA and possible OHS  3.  HFpEF/severe pulmonary hypertension: -Increased risk of volume overload with A. fib with RVR -IV Lasix with KCl repletion as indicated -Management per pulmonology  4. AECOPD: -Taper steroids as able -Per pulmonology  5.  Morbid obesity with OSA and possible OHS: -Weight loss advised -CPAP recommended  6.  HLD: -PTA atorvastatin   For questions or updates, please contact [place] Please consult [email or web address] for contact info under Cardiology/STEMI.   Signed, Christell Faith, PA-C [place] Pager: [phone] 01/21/2020, 9:20 AM

## 2020-01-21 NOTE — Progress Notes (Signed)
*  PRELIMINARY RESULTS* Echocardiogram 2D Echocardiogram has been performed.  Paula Torres 01/21/2020, 9:24 AM

## 2020-01-21 NOTE — Progress Notes (Signed)
CRITICAL CARE PROGRESS NOTE    Name: Paula Torres MRN: [id number] DOB: 1951/08/07     LOS: 2   SUBJECTIVE FINDINGS & SIGNIFICANT EVENTS   Patient description:  : 69yo female w/ a past medical history of advanced COPD, chronic hypoxemic on 3L/min [place], severe pulmonary hypertension and diastolic heart failure who presented to the ED  w/ progressive dyspnea that began around midnight. Upon arrival to ED via EMS, the patient was tachypneic w/RR in the 40s, oxygen saturation in the 80s and in obvious distress. She was given 70m lasix IVP, 1277mSolumedrol, Duoneb and placed on BIPAP.   Lines / Drains: PIVx2  Cultures / Sepsis markers: PCT- wnl MRSA PCR - negative RVP - negative COVID19 - negative  Antibiotics: Doxycycline 100 po bid   Protocols / Consultants: PCCM , hospitalist    Overnight: 5/19- improved respiratory status, O2 requirement back to home setting of 3L/min 01/21/20 -patient was transferred out to medical floor yesterday.  She states she had coffee at 11pm and after that went into AFrVR.  She generally only drinks decaf.  Cardiology consultation in process.   PAST MEDICAL HISTORY   Past Medical History:  Diagnosis Date  . CHF (congestive heart failure) ([facility]  . COPD (chronic obstructive pulmonary disease) ([facility]  . Hypertension      SURGICAL HISTORY   Past Surgical History:  Procedure Laterality Date  . NO PAST SURGERIES    . RIGHT/LEFT HEART CATH AND CORONARY ANGIOGRAPHY Left 12/21/2019   Procedure: RIGHT/LEFT HEART CATH AND CORONARY ANGIOGRAPHY;  Surgeon: ArWellington HampshireMD;  [facility];  Service: Cardiovascular;  Laterality: Left;     FAMILY HISTORY   Family History  Problem Relation Age of Onset  . Hypertension Other      SOCIAL HISTORY    Social History   Tobacco Use  . Smoking status: Former Smoker    Types: Cigarettes    Quit date: 06/03/2017    Years since quitting: 2.6  . Smokeless tobacco: Never Used  Substance Use Topics  . Alcohol use: Not Currently  . Drug use: Never     MEDICATIONS   Current Medication:  Current Facility-Administered Medications:  .  ambrisentan (LETAIRIS) tablet 5 mg, 5 mg, Oral, Daily, AlLanney GinsFuad, MD, 5 mg at 01/20/20 1035 .  amiodarone (NEXTERONE PREMIX) 360-4.14 MG/200ML-% (1.8 mg/mL) IV infusion, 30 mg/hr, Intravenous, Continuous, Dunn, Ryan M, PA-C, Last Rate: 16.67 mL/hr at 01/21/20 1525, 30 mg/hr at 01/21/20 1525 .  aspirin chewable tablet 81 mg, 81 mg, Oral, Daily, Yamira Papa, MD, 81 mg at 01/21/20 1058 .  atorvastatin (LIPITOR) tablet 20 mg, 20 mg, Oral, QHS, Niccole Witthuhn, MD, 20 mg at 01/20/20 2251 .  chlorhexidine (PERIDEX) 0.12 % solution 15 mL, 15 mL, Mouth Rinse, BID, AlLanney GinsFuad, MD, 15 mL at 01/21/20 1058 .  Chlorhexidine Gluconate Cloth 2 % PADS 6 each, 6 each, Topical, Daily, AlOttie GlazierMD, 6 each at 01/20/20 2036 .  docusate sodium (COLACE) capsule 100 mg, 100 mg, Oral, BID PRN, JaTonye RoyaltyNP .  doxycycline (VIBRA-TABS) tablet 100 mg, 100 mg, Oral, Q12H, JaTonye RoyaltyNP, 100 mg at 01/21/20 1123 .  escitalopram (LEXAPRO) tablet 20 mg, 20 mg, Oral, QHS, Josedaniel Haye, MD, 20 mg at 01/20/20 2251 .  famotidine (PEPCID) tablet 20 mg, 20 mg, Oral, Daily, AlLanney GinsFuad, MD, 20 mg at 01/21/20 1058 .  furosemide (LASIX) injection 40 mg, 40 mg, Intravenous, Daily, AlOttie GlazierMD, 40  mg at 01/21/20 1100 .  heparin ADULT infusion 100 units/mL (25000 units/282m sodium chloride 0.45%), 1,450 Units/hr, Intravenous, Continuous, GDallie Piles RPH, Last Rate: 14.5 mL/hr at 01/21/20 1423, 1,450 Units/hr at 01/21/20 1423 .  insulin aspart (novoLOG) injection 0-15 Units, 0-15 Units, Subcutaneous, TID WC, JTonye Royalty NP, 2 Units at 01/21/20  1146 .  insulin aspart (novoLOG) injection 0-5 Units, 0-5 Units, Subcutaneous, QHS, JTonye Royalty NP .  levalbuterol ([facility] nebulizer solution 0.63 mg, 0.63 mg, Nebulization, Q6H PRN, DIdolina Primer Ryan M, PA-C .  MEDLINE mouth rinse, 15 mL, Mouth Rinse, q12n4p, ALanney Gins Jo Booze, MD, 15 mL at 01/21/20 1529 .  metoprolol tartrate (LOPRESSOR) injection 2.5-5 mg, 2.5-5 mg, Intravenous, Q6H PRN, KDarel HongD, NP .  ondansetron ([facility] injection 4 mg, 4 mg, Intravenous, Q6H PRN, JTonye Royalty NP .  polyethylene glycol (MIRALAX / GLYCOLAX) packet 17 g, 17 g, Oral, Daily PRN, JTonye Royalty NP .  predniSONE (DELTASONE) tablet 40 mg, 40 mg, Oral, Q breakfast, JTonye Royalty NP, 40 mg at 01/21/20 0809 .  sodium chloride flush (NS) 0.9 % injection 10-40 mL, 10-40 mL, Intracatheter, PRN, AOttie Glazier MD    ALLERGIES   Codeine and Sulfa antibiotics    REVIEW OF SYSTEMS   10 point ROS negative except physical weakness  PHYSICAL EXAMINATION   Vital Signs: Temp:  [97.7 F (36.5 C)-98.4 F (36.9 C)] 97.8 F (36.6 C) (05/20 1546) Pulse Rate:  [59-138] 74 (05/20 1500) Resp:  [15-39] 17 (05/20 1500) BP: (78-139)/(39-76) 78/59 (05/20 1546) SpO2:  [88 %-95 %] 92 % (05/20 1500) Weight:  [119.2 kg] 119.2 kg (05/20 0230)  GENERAL:chrnoically ill appearing HEAD: Normocephalic, atraumatic.  EYES: Pupils equal, round, reactive to light.  No scleral icterus.  MOUTH: Moist mucosal membrane. NECK: Supple. No thyromegaly. No nodules. No JVD.  PULMONARY: mild bilateral rhonchorous breath sounds CARDIOVASCULAR: S1 and S2. Regular rate and rhythm. No murmurs, rubs, or gallops.  GASTROINTESTINAL: Soft, nontender, non-distended. No masses. Positive bowel sounds. No hepatosplenomegaly.  MUSCULOSKELETAL: No swelling, clubbing, or edema.  NEUROLOGIC: Mild distress due to acute illness SKIN:intact,warm,dry   PERTINENT DATA     Infusions: . amiodarone 30 mg/hr (01/21/20 1525)  .  heparin 1,450 Units/hr (01/21/20 1423)   Scheduled Medications: . ambrisentan  5 mg Oral Daily  . aspirin  81 mg Oral Daily  . atorvastatin  20 mg Oral QHS  . chlorhexidine  15 mL Mouth Rinse BID  . Chlorhexidine Gluconate Cloth  6 each Topical Daily  . doxycycline  100 mg Oral Q12H  . escitalopram  20 mg Oral QHS  . famotidine  20 mg Oral Daily  . furosemide  40 mg Intravenous Daily  . insulin aspart  0-15 Units Subcutaneous TID WC  . insulin aspart  0-5 Units Subcutaneous QHS  . mouth rinse  15 mL Mouth Rinse q12n4p  . predniSONE  40 mg Oral Q breakfast   PRN Medications: docusate sodium, levalbuterol, metoprolol tartrate, ondansetron (ZOFRAN) IV, polyethylene glycol, sodium chloride flush Hemodynamic parameters:   Intake/Output: 05/19 0701 - 05/20 0700 In: 360 [P.O.:360] Out: 1100 [Urine:1100]  Ventilator  Settings:     LAB RESULTS:  Basic Metabolic Panel: Recent Labs  Lab 01/19/20 0433 01/19/20 0433 01/19/20 1346 01/19/20 1346 01/20/20 0432 01/21/20 1050  NA 141  --  141  --  141 138  K 4.2   < > 4.3   < > 4.5 3.7  CL 99  --  94*  --  93* 93*  CO2  36*  --  34*  --  36* 34*  GLUCOSE 198*  --  166*  --  133* 149*  BUN 16  --  18  --  30* 36*  CREATININE 0.76  --  0.91  --  0.83 1.02*  CALCIUM 8.9  --  9.4  --  9.3 8.7*  MG  --   --   --   --   --  2.1   < > = values in this interval not displayed.   Liver Function Tests: Recent Labs  Lab 01/19/20 0433  AST 15  ALT 14  ALKPHOS 84  BILITOT 0.7  PROT 7.2  ALBUMIN 3.9   No results for input(s): LIPASE, AMYLASE in the last 168 hours. No results for input(s): AMMONIA in the last 168 hours. CBC: Recent Labs  Lab 01/19/20 0433 01/20/20 0432 01/21/20 1050  WBC 17.5* 15.5* 13.5*  NEUTROABS 14.4*  --   --   HGB 12.2 11.1* 12.0  HCT 40.6 36.5 38.5  MCV 91.6 90.8 89.1  PLT 332 298 290   Cardiac Enzymes: No results for input(s): CKTOTAL, CKMB, CKMBINDEX, TROPONINI in the last 168 hours. BNP: Invalid  input(s): POCBNP CBG: Recent Labs  Lab 01/20/20 1556 01/20/20 2033 01/21/20 0741 01/21/20 1129 01/21/20 1544  GLUCAP 141* 172* 102* 141* 193*       IMAGING RESULTS:  Imaging: DG Chest Port 1 View  Result Date: 01/20/2020 CLINICAL DATA:  Respiratory failure. EXAM: PORTABLE CHEST 1 VIEW COMPARISON:  01/19/2020 FINDINGS: The heart is borderline enlarged. The mediastinal and hilar contours are stable. Stable underlying emphysematous changes and streaky bibasilar atelectasis. Mild central vascular congestion without pulmonary edema. No definite infiltrates or effusions. The bony thorax is intact. IMPRESSION: Cardiac enlargement and mild central vascular congestion without overt pulmonary edema. Electronically Signed   By: Marijo Sanes M.D.   On: 01/20/2020 05:53   ECHOCARDIOGRAM COMPLETE  Result Date: 01/21/2020    ECHOCARDIOGRAM REPORT   Patient Name:   ARWEN Coggin Date of Exam: 01/21/2020 Medical Rec #:  [id number]      Height:       65.0 in Accession #:    [id number]     Weight:       262.8 lb Date of Birth:  Aug 14, 1951     BSA:          2.222 m Patient Age:    82 years       BP:           121/72 mmHg Patient Gender: F              HR:           109 bpm. Exam Location:  [facility] Procedure: 2D Echo, Color Doppler and Cardiac Doppler Indications:     I48.91 Atrial Fibrillation  History:         Patient has prior history of Echocardiogram examinations, most                  recent 11/09/2019. CHF, COPD; Risk Factors:Hypertension.  Sonographer:     Charmayne Sheer RDCS (AE) Referring Phys:  [id number] Bradly Bienenstock Diagnosing Phys: Ida Rogue MD  Sonographer Comments: Technically difficult study due to poor echo windows, suboptimal parasternal window, suboptimal apical window and no subcostal window. Image acquisition challenging due to patient body habitus and Image acquisition challenging due to COPD. IMPRESSIONS  1. Left ventricular ejection fraction, by estimation, is 60 to 65%. The left ventricle  has  normal function. The left ventricle has no regional wall motion abnormalities. Left ventricular diastolic parameters are indeterminate.  2. Right ventricular systolic function was not well visualized. The right ventricular size is normal. Tricuspid regurgitation signal is inadequate for assessing PA pressure.  3. The inferior vena cava is dilated in size with <50% respiratory variability, suggesting right atrial pressure of 15 mmHg.  4. Rhythm is atrial fibrillation FINDINGS  Left Ventricle: Left ventricular ejection fraction, by estimation, is 60 to 65%. The left ventricle has normal function. The left ventricle has no regional wall motion abnormalities. The left ventricular internal cavity size was normal in size. There is  no left ventricular hypertrophy. Left ventricular diastolic parameters are indeterminate. Right Ventricle: The right ventricular size is normal. No increase in right ventricular wall thickness. Right ventricular systolic function was not well visualized. Tricuspid regurgitation signal is inadequate for assessing PA pressure. Left Atrium: Left atrial size was normal in size. Right Atrium: Right atrial size was normal in size. Pericardium: Trivial pericardial effusion is present. Mitral Valve: The mitral valve is normal in structure. Normal mobility of the mitral valve leaflets. No evidence of mitral valve regurgitation. No evidence of mitral valve stenosis. MV peak gradient, 7.3 mmHg. The mean mitral valve gradient is 3.0 mmHg. Tricuspid Valve: The tricuspid valve is normal in structure. Tricuspid valve regurgitation is not demonstrated. No evidence of tricuspid stenosis. Aortic Valve: The aortic valve is normal in structure. Aortic valve regurgitation is not visualized. No aortic stenosis is present. Aortic valve mean gradient measures 5.0 mmHg. Aortic valve peak gradient measures 12.1 mmHg. Aortic valve area, by VTI measures 1.99 cm. Pulmonic Valve: The pulmonic valve was normal in  structure. Pulmonic valve regurgitation is not visualized. No evidence of pulmonic stenosis. Aorta: The aortic root is normal in size and structure. Venous: The inferior vena cava is dilated in size with less than 50% respiratory variability, suggesting right atrial pressure of 15 mmHg. IAS/Shunts: No atrial level shunt detected by color flow Doppler.  LEFT VENTRICLE PLAX 2D LVIDd:         4.47 cm  Diastology LVIDs:         3.74 cm  LV e' lateral:   12.00 cm/s LV PW:         0.68 cm  LV E/e' lateral: 11.5 LV IVS:        1.14 cm LVOT diam:     2.20 cm LV SV:         60 LV SV Index:   27 LVOT Area:     3.80 cm  LEFT ATRIUM             Index LA diam:        3.80 cm 1.71 cm/m LA Vol (A2C):   27.1 ml 12.20 ml/m LA Vol (A4C):   42.6 ml 19.18 ml/m LA Biplane Vol: 34.9 ml 15.71 ml/m  AORTIC VALVE                    PULMONIC VALVE AV Area (Vmax):    2.18 cm     PV Vmax:       1.09 m/s AV Area (Vmean):   2.68 cm     PV Vmean:      71.500 cm/s AV Area (VTI):     1.99 cm     PV VTI:        0.176 m AV Vmax:           174.00 cm/s  PV Peak grad:  4.8 mmHg AV Vmean:          106.000 cm/s PV Mean grad:  2.0 mmHg AV VTI:            0.300 m AV Peak Grad:      12.1 mmHg AV Mean Grad:      5.0 mmHg LVOT Vmax:         100.00 cm/s LVOT Vmean:        74.700 cm/s LVOT VTI:          0.157 m LVOT/AV VTI ratio: 0.52  AORTA Ao Root diam: 3.60 cm MITRAL VALVE MV Area (PHT): 3.76 cm     SHUNTS MV Peak grad:  7.3 mmHg     Systemic VTI:  0.16 m MV Mean grad:  3.0 mmHg     Systemic Diam: 2.20 cm MV Vmax:       1.35 m/s MV Vmean:      77.2 cm/s MV Decel Time: 202 msec MV E velocity: 138.33 cm/s Ida Rogue MD Electronically signed by Ida Rogue MD Signature Date/Time: 01/21/2020/2:36:58 PM    Final    _0 @ ECHOCARDIOGRAM COMPLETE  Result Date: 01/21/2020    ECHOCARDIOGRAM REPORT   Patient Name:   DEAJA Monsivais Date of Exam: 01/21/2020 Medical Rec #:  [id number]      Height:       65.0 in Accession #:    [id number]     Weight:        262.8 lb Date of Birth:  [birth year]     BSA:          2.222 m Patient Age:    59 years       BP:           121/72 mmHg Patient Gender: F              HR:           109 bpm. Exam Location:  [facility] Procedure: 2D Echo, Color Doppler and Cardiac Doppler Indications:     I48.91 Atrial Fibrillation  History:         Patient has prior history of Echocardiogram examinations, most                  recent 11/09/2019. CHF, COPD; Risk Factors:Hypertension.  Sonographer:     Charmayne Sheer RDCS (AE) Referring Phys:  [id number] Bradly Bienenstock Diagnosing Phys: Ida Rogue MD  Sonographer Comments: Technically difficult study due to poor echo windows, suboptimal parasternal window, suboptimal apical window and no subcostal window. Image acquisition challenging due to patient body habitus and Image acquisition challenging due to COPD. IMPRESSIONS  1. Left ventricular ejection fraction, by estimation, is 60 to 65%. The left ventricle has normal function. The left ventricle has no regional wall motion abnormalities. Left ventricular diastolic parameters are indeterminate.  2. Right ventricular systolic function was not well visualized. The right ventricular size is normal. Tricuspid regurgitation signal is inadequate for assessing PA pressure.  3. The inferior vena cava is dilated in size with <50% respiratory variability, suggesting right atrial pressure of 15 mmHg.  4. Rhythm is atrial fibrillation FINDINGS  Left Ventricle: Left ventricular ejection fraction, by estimation, is 60 to 65%. The left ventricle has normal function. The left ventricle has no regional wall motion abnormalities. The left ventricular internal cavity size was normal in size. There is  no left ventricular hypertrophy. Left ventricular diastolic parameters are indeterminate. Right Ventricle: The right ventricular size is  normal. No increase in right ventricular wall thickness. Right ventricular systolic function was not well visualized. Tricuspid regurgitation  signal is inadequate for assessing PA pressure. Left Atrium: Left atrial size was normal in size. Right Atrium: Right atrial size was normal in size. Pericardium: Trivial pericardial effusion is present. Mitral Valve: The mitral valve is normal in structure. Normal mobility of the mitral valve leaflets. No evidence of mitral valve regurgitation. No evidence of mitral valve stenosis. MV peak gradient, 7.3 mmHg. The mean mitral valve gradient is 3.0 mmHg. Tricuspid Valve: The tricuspid valve is normal in structure. Tricuspid valve regurgitation is not demonstrated. No evidence of tricuspid stenosis. Aortic Valve: The aortic valve is normal in structure. Aortic valve regurgitation is not visualized. No aortic stenosis is present. Aortic valve mean gradient measures 5.0 mmHg. Aortic valve peak gradient measures 12.1 mmHg. Aortic valve area, by VTI measures 1.99 cm. Pulmonic Valve: The pulmonic valve was normal in structure. Pulmonic valve regurgitation is not visualized. No evidence of pulmonic stenosis. Aorta: The aortic root is normal in size and structure. Venous: The inferior vena cava is dilated in size with less than 50% respiratory variability, suggesting right atrial pressure of 15 mmHg. IAS/Shunts: No atrial level shunt detected by color flow Doppler.  LEFT VENTRICLE PLAX 2D LVIDd:         4.47 cm  Diastology LVIDs:         3.74 cm  LV e' lateral:   12.00 cm/s LV PW:         0.68 cm  LV E/e' lateral: 11.5 LV IVS:        1.14 cm LVOT diam:     2.20 cm LV SV:         60 LV SV Index:   27 LVOT Area:     3.80 cm  LEFT ATRIUM             Index LA diam:        3.80 cm 1.71 cm/m LA Vol (A2C):   27.1 ml 12.20 ml/m LA Vol (A4C):   42.6 ml 19.18 ml/m LA Biplane Vol: 34.9 ml 15.71 ml/m  AORTIC VALVE                    PULMONIC VALVE AV Area (Vmax):    2.18 cm     PV Vmax:       1.09 m/s AV Area (Vmean):   2.68 cm     PV Vmean:      71.500 cm/s AV Area (VTI):     1.99 cm     PV VTI:        0.176 m AV Vmax:            174.00 cm/s  PV Peak grad:  4.8 mmHg AV Vmean:          106.000 cm/s PV Mean grad:  2.0 mmHg AV VTI:            0.300 m AV Peak Grad:      12.1 mmHg AV Mean Grad:      5.0 mmHg LVOT Vmax:         100.00 cm/s LVOT Vmean:        74.700 cm/s LVOT VTI:          0.157 m LVOT/AV VTI ratio: 0.52  AORTA Ao Root diam: 3.60 cm MITRAL VALVE MV Area (PHT): 3.76 cm     SHUNTS MV Peak grad:  7.3 mmHg  Systemic VTI:  0.16 m MV Mean grad:  3.0 mmHg     Systemic Diam: 2.20 cm MV Vmax:       1.35 m/s MV Vmean:      77.2 cm/s MV Decel Time: 202 msec MV E velocity: 138.33 cm/s Ida Rogue MD Electronically signed by Ida Rogue MD Signature Date/Time: 01/21/2020/2:36:58 PM    Final      ASSESSMENT AND PLAN    -Multidisciplinary rounds held today  Acute on chronic hypoxemic respiratory failure   -NIV BIPAP wean O2 as able   - please give BIPAP breaks for nourishment   -Respiratory viral panel-negative    Acute severe COPD exacerbation  - COPD care path   - DuoNeb q6h  - IV solumedrol >>prednisone 40  - Bronchopulmonary hygiene  - IS at bedside   -Flutter valve  - PT/OT  -patient has Trilogy NIV device at home- may use bipap QHS while hospitalized - RT for settings   Severe pulmonary hypertension    - precapillary Group I and 3   - s/p RHC -Dr Fletcher Anon   Myrtis Hopping 31m po daily -holding today 01/21/20- due to possible cardioversion as per cardiology    -Tyvasso ordered on outpatient - awaiting arrival    -currently NYHA 3    -BNP - 66 on this admission   Obstructive sleep apnea   - QHS CPAP - RT for settings   GI/Nutrition GI PROPHYLAXIS as indicated DIET-->TF's as tolerated Constipation protocol as indicated  ENDO - ICU hypoglycemic\Hyperglycemia protocol -check FSBS per protocol   ELECTROLYTES -follow labs as needed -replace as needed -pharmacy consultation   DVT/GI PRX ordered -SCDs  TRANSFUSIONS AS NEEDED MONITOR FSBS ASSESS the need for LABS as  needed   Critical care provider statement:   Critical care time (minutes): 33  Critical care time was exclusive of: Separately billable procedures and  treating other patients  Critical care was necessary to treat or prevent imminent or  life-threatening deterioration of the following conditions: acute on chronic hypoxemic respiratory failure, severe pulmonary htn, AECOPD, OSA  Critical care was time spent personally by me on the following  activities: Development of treatment plan with patient or surrogate,  discussions with consultants, evaluation of patient's response to  treatment, examination of patient, obtaining history from patient or  surrogate, ordering and performing treatments and interventions, ordering  and review of laboratory studies and re-evaluation of patient's condition  I assumed direction of critical care for this patient from another  provider in my specialty: no    This document was prepared using Dragon voice recognition software and may include unintentional dictation errors.    FOttie Glazier M.D.  Division of [facility]

## 2020-01-21 NOTE — TOC Progression Note (Addendum)
Transition of Care [facility]) - Progression Note    Patient Details  Name: Paula Torres MRN: [id number] Date of Birth: December 17, 1950  Transition of Care [facility]) CM/SW [place], LCSW Phone Number: 01/21/2020, 11:45 AM  Clinical Narrative:   Confirmed with [facility] that patient was active with them for RN and PT prior to this hospitalization. Reached out to [place] to inquire again about if patient has a home Trilogy. Will continue to follow.  1:00- Spoke with [employer] Representative Zach who reported it does not appear patient got a Trilogy, but the notes make it seem like patient did. Thedore Mins will double check and let CSW know an update.  3:05- Spoke with Thedore Mins who reported patient does not have a Trilogy but does qualify for one per chart review due to patient's COPD and OHS diagnoses. CSW reached out to MD (Dr. Roosevelt Locks) to follow-up if this is needed.         Expected Discharge Plan and Services                                                 Social Determinants of Health (SDOH) Interventions    Readmission Risk Interventions No flowsheet data found.

## 2020-01-21 NOTE — Progress Notes (Signed)
Magda Paganini RN at bedside, aware of PIV started.  Also notified that amiodarone should infuse via PIV not in midline.  Pt and family also aware of conversation.

## 2020-01-21 NOTE — Progress Notes (Signed)
Report called to [facility] in CCU. Patient transported via bed by SWOT nurse to CCU room 13.

## 2020-01-21 NOTE — Progress Notes (Signed)
[facility] Progress Note Patient Name: Mallary Kreger DOB: 1951/01/10 MRN: [id number]   Date of Service  01/21/2020  HPI/Events of Note  Pt admitted to [facility] ICU with acute on chronic respiratory failure, congestive heart failure, and  COPD exacerbation.  eICU Interventions  New Patient Evaluation completed.        Kerry Kass Sadey Yandell 01/21/2020, 3:08 AM

## 2020-01-22 ENCOUNTER — Encounter: Payer: Self-pay | Admitting: Cardiovascular Disease

## 2020-01-22 DIAGNOSIS — I2721 Secondary pulmonary arterial hypertension: Secondary | ICD-10-CM

## 2020-01-22 LAB — CBC WITH DIFFERENTIAL/PLATELET
Abs Immature Granulocytes: 0.07 10*3/uL (ref 0.00–0.07)
Basophils Absolute: 0.1 10*3/uL (ref 0.0–0.1)
Basophils Relative: 0 %
Eosinophils Absolute: 0.1 10*3/uL (ref 0.0–0.5)
Eosinophils Relative: 1 %
HCT: 38.7 % (ref 36.0–46.0)
Hemoglobin: 12.1 g/dL (ref 12.0–15.0)
Immature Granulocytes: 1 %
Lymphocytes Relative: 19 %
Lymphs Abs: 2.6 10*3/uL (ref 0.7–4.0)
MCH: 27.8 pg (ref 26.0–34.0)
MCHC: 31.3 g/dL (ref 30.0–36.0)
MCV: 88.8 fL (ref 80.0–100.0)
Monocytes Absolute: 0.8 10*3/uL (ref 0.1–1.0)
Monocytes Relative: 6 %
Neutro Abs: 9.9 10*3/uL — ABNORMAL HIGH (ref 1.7–7.7)
Neutrophils Relative %: 73 %
Platelets: 300 10*3/uL (ref 150–400)
RBC: 4.36 MIL/uL (ref 3.87–5.11)
RDW: 15.3 % (ref 11.5–15.5)
WBC: 13.5 10*3/uL — ABNORMAL HIGH (ref 4.0–10.5)
nRBC: 0 % (ref 0.0–0.2)

## 2020-01-22 LAB — BASIC METABOLIC PANEL
Anion gap: 11 (ref 5–15)
BUN: 31 mg/dL — ABNORMAL HIGH (ref 8–23)
CO2: 36 mmol/L — ABNORMAL HIGH (ref 22–32)
Calcium: 8.9 mg/dL (ref 8.9–10.3)
Chloride: 94 mmol/L — ABNORMAL LOW (ref 98–111)
Creatinine, Ser: 1.02 mg/dL — ABNORMAL HIGH (ref 0.44–1.00)
GFR calc Af Amer: >60 mL/min (ref >60)
GFR calc non Af Amer: 56 mL/min — ABNORMAL LOW (ref >60)
Glucose, Bld: 99 mg/dL (ref 70–99)
Potassium: 4 mmol/L (ref 3.5–5.1)
Sodium: 141 mmol/L (ref 135–145)

## 2020-01-22 LAB — GLUCOSE, CAPILLARY
Glucose-Capillary: 87 mg/dL (ref 70–99)
Glucose-Capillary: 83 mg/dL (ref 70–99)

## 2020-01-22 LAB — PHOSPHORUS: Phosphorus: 4.6 mg/dL (ref 2.5–4.6)

## 2020-01-22 LAB — MAGNESIUM: Magnesium: 2.4 mg/dL (ref 1.7–2.4)

## 2020-01-22 LAB — HEPARIN LEVEL (UNFRACTIONATED): Heparin Unfractionated: 0.52 IU/mL (ref 0.30–0.70)

## 2020-01-22 SURGERY — CARDIOVERSION
Anesthesia: General

## 2020-01-22 MED ORDER — FUROSEMIDE 20 MG PO TABS
40.0000 mg | ORAL_TABLET | Freq: Every day | ORAL | Status: DC
  Filled 2020-01-22: qty 2

## 2020-01-22 MED ORDER — AMIODARONE HCL 200 MG PO TABS
400.0000 mg | ORAL_TABLET | Freq: Two times a day (BID) | ORAL | Status: DC
  Administered 2020-01-22: 400 mg via ORAL
  Filled 2020-01-22: qty 2

## 2020-01-22 MED ORDER — AMBRISENTAN 5 MG PO TABS: 5.0000 mg | ORAL_TABLET | Freq: Every day | ORAL | Status: DC

## 2020-01-22 MED ORDER — APIXABAN 5 MG PO TABS
5.0000 mg | ORAL_TABLET | Freq: Two times a day (BID) | ORAL | Status: DC
  Administered 2020-01-22: 5 mg via ORAL
  Filled 2020-01-22: qty 1

## 2020-01-22 MED ORDER — AMIODARONE HCL 400 MG PO TABS: ORAL_TABLET | ORAL | 0 refills | Status: DC

## 2020-01-22 MED ORDER — APIXABAN 5 MG PO TABS: 5.0000 mg | ORAL_TABLET | Freq: Two times a day (BID) | ORAL | 0 refills | Status: DC

## 2020-01-22 MED ORDER — DOXYCYCLINE HYCLATE 100 MG PO TABS: 100.0000 mg | ORAL_TABLET | Freq: Two times a day (BID) | ORAL | 0 refills | Status: AC

## 2020-01-22 NOTE — Progress Notes (Signed)
Progress Note  Patient Name: Paula Torres Date of Encounter: 01/22/2020  Primary Cardiologist: Fletcher Anon  Subjective   Converted to sinus rhythm 5/20 and has maintained sinus since. Feels back to her baseline from a respiratory perspective. Sleepy this morning. No chest pain or palpitations. Potassium and magnesium at goal. Renal function stable.   Inpatient Medications    Scheduled Meds: . ambrisentan  5 mg Oral Daily  . aspirin  81 mg Oral Daily  . atorvastatin  20 mg Oral QHS  . chlorhexidine  15 mL Mouth Rinse BID  . Chlorhexidine Gluconate Cloth  6 each Topical Daily  . doxycycline  100 mg Oral Q12H  . escitalopram  20 mg Oral QHS  . famotidine  20 mg Oral Daily  . furosemide  40 mg Intravenous Daily  . insulin aspart  0-15 Units Subcutaneous TID WC  . insulin aspart  0-5 Units Subcutaneous QHS  . mouth rinse  15 mL Mouth Rinse q12n4p  . predniSONE  40 mg Oral Q breakfast   Continuous Infusions: . amiodarone 30 mg/hr (01/22/20 0159)  . heparin 1,450 Units/hr (01/21/20 1706)   PRN Meds: docusate sodium, levalbuterol, metoprolol tartrate, ondansetron (ZOFRAN) IV, polyethylene glycol, sodium chloride flush   Vital Signs    Vitals:   01/22/20 0100 01/22/20 0400 01/22/20 0500 01/22/20 0600  BP: (!) 141/69 (!) 108/93 127/62 130/65  Pulse: 67 64 67 60  Resp: (!) 23 20 (!) 27 (!) 21  Temp:  97.8 F (36.6 C)    TempSrc:  Oral    SpO2: 92% 90% (!) 88% 91%  Weight:      Height:        Intake/Output Summary (Last 24 hours) at 01/22/2020 0923 Last data filed at 01/22/2020 0500 Gross per 24 hour  Intake 816.44 ml  Output 1900 ml  Net -1083.56 ml   Filed Weights   01/19/20 0455 01/20/20 0500 01/21/20 0230  Weight: 118.8 kg 121.8 kg 119.2 kg    Telemetry    SR - Personally Reviewed  ECG    No new tracings - Personally Reviewed  Physical Exam   GEN: No acute distress. Obese.  Neck: JVD difficult to assess secondary to body habitus. Cardiac: RRR, no  murmurs, rubs, or gallops.  Respiratory: Mildly diminished breath sounds bilaterally.  GI: Soft, nontender, non-distended.   MS: No edema; No deformity. Neuro:  Alert and oriented x 3; Nonfocal.  Psych: Normal affect.  Labs    Chemistry Recent Labs  Lab 01/19/20 0433 01/19/20 1346 01/20/20 0432 01/21/20 1050 01/22/20 0551  NA 141   < > 141 138 141  K 4.2   < > 4.5 3.7 4.0  CL 99   < > 93* 93* 94*  CO2 36*   < > 36* 34* 36*  GLUCOSE 198*   < > 133* 149* 99  BUN 16   < > 30* 36* 31*  CREATININE 0.76   < > 0.83 1.02* 1.02*  CALCIUM 8.9   < > 9.3 8.7* 8.9  PROT 7.2  --   --   --   --   ALBUMIN 3.9  --   --   --   --   AST 15  --   --   --   --   ALT 14  --   --   --   --   ALKPHOS 84  --   --   --   --   BILITOT 0.7  --   --   --   --  GFRNONAA >60   < > >60 56* 56*  GFRAA >60   < > >60 >60 >60  ANIONGAP 6   < > _0 < > = values in this interval not displayed.     Hematology Recent Labs  Lab 01/20/20 0432 01/21/20 1050 01/22/20 0551  WBC 15.5* 13.5* 13.5*  RBC 4.02 4.32 4.36  HGB 11.1* 12.0 12.1  HCT 36.5 38.5 38.7  MCV 90.8 89.1 88.8  MCH 27.6 27.8 27.8  MCHC 30.4 31.2 31.3  RDW 15.1 15.4 15.3  PLT 298 290 300    Cardiac EnzymesNo results for input(s): TROPONINI in the last 168 hours. No results for input(s): TROPIPOC in the last 168 hours.   BNP Recent Labs  Lab 01/19/20 0433 01/20/20 2335  BNP 66.0 86.8     DDimer No results for input(s): DDIMER in the last 168 hours.   Radiology      Cardiac Studies   2D echo 01/21/2020: 1. Left ventricular ejection fraction, by estimation, is 60 to 65%. The  left ventricle has normal function. The left ventricle has no regional  wall motion abnormalities. Left ventricular diastolic parameters are  indeterminate.  2. Right ventricular systolic function was not well visualized. The right  ventricular size is normal. Tricuspid regurgitation signal is inadequate  for assessing PA pressure.  3. The  inferior vena cava is dilated in size with <50% respiratory  variability, suggesting right atrial pressure of 15 mmHg.  4. Rhythm is atrial fibrillation __________  [facility] 12/21/2019:  Ost Cx to Prox Cx lesion is 20% stenosed.  Prox RCA lesion is 40% stenosed.  1. Moderate one-vessel coronary artery disease involving the right coronary artery. No evidence of obstructive disease. Moderately calcified coronary arteries overall. 2. Left ventricular angiography was not performed. EF was normal by echo. 3. Right heart catheterization showed mildly elevated right and left filling pressures, severe pulmonary hypertension and mildly reduced cardiac output. The patient developed right bundle branch block as the catheter enters the right ventricle. This, most of the time is transient.  RA: 12 mmHg. RV: 71 over 6 mmHg Pulmonary capillary wedge pressure: 15 mmHg PA: 75/27 with a mean of 47 mmHg. Fick cardiac output is 4.75 with a cardiac index of 2.14. Pulmonary vascular resistance: 6.74 [place] unit  Recommendations: Recommend medical therapy for nonobstructive coronary artery disease. Right heart catheterization does confirm severe pulmonary hypertension which is likely mixed in etiology but right now appears to be mostly arterial hypertension given the wedge pressure is only 15 mmHg. Recommend treatment of underlying sleep apnea and will forward this information to pulmonary for further management. __________  2D echo 11/09/2019: 1. Left ventricular ejection fraction, by estimation, is 60 to 65%. The  left ventricle has normal function. The left ventricle has no regional  wall motion abnormalities. There is mild left ventricular hypertrophy.  Left ventricular diastolic parameters  are consistent with Grade I diastolic dysfunction (impaired relaxation).  2. Right ventricular systolic function is normal. The right ventricular  size is normal. Tricuspid regurgitation signal is  inadequate for assessing  PA pressure.  3. Left atrial size was mildly dilated.  4. The mitral valve is normal in structure. No evidence of mitral valve  regurgitation. No evidence of mitral stenosis.  5. The aortic valve is normal in structure. Aortic valve regurgitation is  not visualized. No aortic stenosis is present.  6. The inferior vena cava is normal in size with greater than 50%  respiratory variability,  suggesting right atrial pressure of 3 mmHg.  Patient Profile     69 y.o. female with history of nonobstructive CAD by LHC in 12/2019, chronic hypoxic respiratory failure on supplemental oxygen at 3 L via nasal cannula, severe pulmonary hypertension, COPD, HTN, morbid obesity, and OSA who is being seen today for the evaluation of Afib with RVR.  Assessment & Plan    1. New onset Afib with RVR: -She presented in sinus rhythm on 5/18, developing new onset A. fib with RVR on the evening of 01/20/2020 around 11:30 PM -She converted to sinus rhythm on 5/20 and has maintained sinus rhythm since -Possibly in the setting of caffeine intake on the evening of 5/19, albuterol nebulizers, AECOPD, and severe pulmonary hypertension -Transition IV amiodarone to PO amiodarone 400 mg bid x 1 week, 200 mg bid x 1 week, then 200 mg daily thereafter -Avoiding beta-blockers in the setting of COPD exacerbation and with prior history of relative hypotension -CHA2DS2-VASc of 4 (CHF, HTN, age x 1, gender) -Transition heparin gtt to Eliquis 5 mg bid  -TSH normal -Potassium and magnesium at goal  -Echo as above  2. Acute on chronic hypoxic and hypercapneic respiratory failure: -Respiratory status is improved and back to her baseline -Currently at PTA supplemental oxygen at 3 L via nasal cannula -Multifactorial including COPD-patient with severe pulmonary hypertension, morbid obesity with OSA and possible OHS  3.  HFpEF/severe pulmonary hypertension: -Appears euvolemic -Increased risk of volume  overload with A. fib with RVR -Transition back to PTA Lasix -Follow up as an outpatient   4. AECOPD: -Taper steroids as able -Per pulmonology  5.  Morbid obesity with OSA and possible OHS: -Weight loss advised -CPAP recommended  6.  HLD: -PTA atorvastatin  For questions or updates, please contact [place] Please consult [email or web address] for contact info under Cardiology/STEMI.    Signed, Christell Faith, PA-C [place] Pager: [phone] 01/22/2020, 9:23 AM

## 2020-01-22 NOTE — Progress Notes (Signed)
ANTICOAGULATION CONSULT NOTE  Pharmacy Consult for heparin Indication: atrial fibrillation  Patient Measurements: Height: 5' 5" (165.1 cm) Weight: 119.2 kg (262 lb 12.6 oz) IBW/kg (Calculated) : 57 Heparin Dosing Weight: 82 kg  Vital Signs: Temp: 97.8 F (36.6 C) (05/21 0400) Temp Source: Oral (05/21 0400) BP: 130/65 (05/21 0600) Pulse Rate: 60 (05/21 0600)  Labs: Recent Labs    01/19/20 1346 01/20/20 0432 01/20/20 0432 01/20/20 2335 01/21/20 1050 01/21/20 2225 01/22/20 0551  HGB  --  11.1*   < >  --  12.0  --  12.1  HCT  --  36.5  --   --  38.5  --  38.7  PLT  --  298  --   --  290  --  300  APTT  --   --   --   --  41*  --   --   LABPROT  --   --   --   --  12.0  --   --   INR  --   --   --   --  0.9  --   --   HEPARINUNFRC  --   --   --   --  <0.10* 0.53 0.52  CREATININE 0.91 0.83  --   --  1.02*  --   --   TROPONINIHS  --   --   --  15  --   --   --    < > = values in this interval not displayed.    Estimated Creatinine Clearance: 68.3 mL/min (A) (by C-G formula based on SCr of 1.02 mg/dL (H)).   Medical History: Past Medical History:  Diagnosis Date  . CHF (congestive heart failure) ([place])   . COPD (chronic obstructive pulmonary disease) ([place])   . Hypertension     Medications:  Scheduled:  . ambrisentan  5 mg Oral Daily  . aspirin  81 mg Oral Daily  . atorvastatin  20 mg Oral QHS  . chlorhexidine  15 mL Mouth Rinse BID  . Chlorhexidine Gluconate Cloth  6 each Topical Daily  . doxycycline  100 mg Oral Q12H  . escitalopram  20 mg Oral QHS  . famotidine  20 mg Oral Daily  . furosemide  40 mg Intravenous Daily  . insulin aspart  0-15 Units Subcutaneous TID WC  . insulin aspart  0-5 Units Subcutaneous QHS  . mouth rinse  15 mL Mouth Rinse q12n4p  . predniSONE  40 mg Oral Q breakfast    Assessment: Patient admitted x SOB/tachypnea w/ h/o COPD, CHF, HTN presenting w/ afib w/ RVR on EKG, last troponin on this admit was WNL, baseline CBC WNL, aPTT/INR  pending. Patient is not on any anticoagulation PTA. Patient is being started on heparin drip for anticoagulation of new onset afib w/ CHADS-VASc = 4. H&H, platelets wnl  Heparin Course: 5/20 initiation: 2000 unit bolus, then 1150 units/hr 5/20 1050 HL<0.10:   Goal of Therapy:  Heparin level 0.3-0.7 units/ml Monitor platelets by anticoagulation protocol: Yes   Plan:  05/21 @ 0500 HL 0.52 therapeutic. Will continue current rate and will recheck HL w/ am labs, CBC stable will continue to monitor.  Tobie Lords, PharmD, BCPS Clinical Pharmacist 01/22/2020,6:50 AM

## 2020-01-22 NOTE — Discharge Summary (Signed)
Physician Discharge Summary  Patient ID: Paula Torres MRN: [id number] DOB/AGE: 69-Dec-1952 69 y.o.  Admit date: 01/19/2020 Discharge date: 01/22/2020  Admission Diagnoses:  Discharge Diagnoses:  Active Problems:   COPD with acute exacerbation (HCC)   Acute on chronic diastolic CHF (congestive heart failure) (HCC)   Atrial fibrillation with RVR (HCC)   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[place])   Discharged Condition: good  Hospital Course: Patient is a 69 year old female with history of COPD, severe pulmonary hypertension, chronic hypoxemic respiratory failure on 3 L oxygen, chronic diastolic congestive heart failure, obstructive sleep apnea who was initially come to the emergency room on 5/18 with complaints of shortness of breath.  Her chest x-ray showed vascular congestion, she was giving steroids and IV Lasix, she was requiring BiPAP in ICU.  Her condition improved 5/19, she was transferred to the medical floor.  While in the medical floor, she developed atrial fibrillation with rapid ventricle response.  She was sent back to stepdown unit, given metoprolol IV and amiodarone.  She is also placed on heparin drip.  Patient was also given digoxin after seen by cardiology.  She converted to sinus on 5/20.  Currently her symptom has resolved.  She is no longer having any short of breath.  He is on 3 L oxygen, which is her baseline.  She is medically stable to be discharged.  Instruction has given to the patient to follow-up with her primary care physician, pulmonology and cardiology in the near future.  She also need to weigh her daily.  Call if significant weight gain.  #1.  Acute on chronic hypoxemic respiratory failure. Patient was chronically on 3 L oxygen.  This is a secondary to COPD exacerbation, acute on chronic diastolic congestive heart failure and severe pulmonary hypertension.  Oxygenation is back to baseline.  2.  COPD exacerbation. Initially received  Solu-Medrol, and prednisone.  She is also receiving doxycycline.  We will continue doxycycline for few days.  Resume home dose prednisone.  3.  Acute on chronic diastolic congestive heart failure with a severe pulmonary hypertension. Volume status much improved.  Resume home dose diuretics.  4.  New onset atrial fibrillation with rapid ventricular response. Patient has converted to sinus. Continue tapering dose of amiodarone.  Continue Eliquis.  Discontinue the beta-blocker per cardiology recommendation.  5.  Obstructive sleep apnea. CPAP while asleep.   Consults: cardiology and pulmonary/intensive care  Significant Diagnostic Studies:   Treatments: Steroids, antibiotics, heparin, amiodarone  Discharge Exam: Blood pressure 135/61, pulse 64, temperature 98.5 F (36.9 C), resp. rate 20, height _0  (1.651 m), weight 119.2 kg, SpO2 100 %. General appearance: alert and cooperative Resp: clear to auscultation bilaterally Cardio: regular rate and rhythm, S1, S2 normal, no murmur, click, rub or gallop GI: soft, non-tender; bowel sounds normal; no masses,  no organomegaly Extremities: 1+ edema with chronic changes.  Disposition: Discharge disposition: 01-Home or Self Care       Discharge Instructions    Diet - low sodium heart healthy   Complete by: As directed    Increase activity slowly   Complete by: As directed      Allergies as of 01/22/2020      Reactions   Codeine    Back Headache   Sulfa Antibiotics    Reported to pt from mother who is no longer living -  Details unknown      Medication List    STOP taking these medications   aspirin EC 81 MG tablet  azithromycin 250 MG tablet Commonly known as: ZITHROMAX   bisoprolol 5 MG tablet Commonly known as: ZEBETA   ibuprofen 200 MG tablet Commonly known as: ADVIL   metoprolol succinate 25 MG 24 hr tablet Commonly known as: TOPROL-XL     TAKE these medications   albuterol 108 (90 Base) MCG/ACT  inhaler Commonly known as: VENTOLIN HFA Inhale 2 puffs into the lungs every 6 (six) hours as needed for wheezing or shortness of breath.   ambrisentan 5 MG tablet Commonly known as: LETAIRIS Take 1 tablet (5 mg total) by mouth daily.   amiodarone 400 MG tablet Commonly known as: PACERONE Take 1 tablet (400 mg total) by mouth 2 (two) times daily for 6 days, THEN 1 tablet (400 mg total) 2 (two) times daily for 7 days, THEN 1 tablet (400 mg total) daily. Start taking on: Jan 22, 2020   apixaban 5 MG Tabs tablet Commonly known as: ELIQUIS Take 1 tablet (5 mg total) by mouth 2 (two) times daily.   atorvastatin 20 MG tablet Commonly known as: LIPITOR Take 1 tablet (20 mg total) by mouth daily at 6 PM.   doxycycline 100 MG tablet Commonly known as: VIBRA-TABS Take 1 tablet (100 mg total) by mouth every 12 (twelve) hours for 5 days.   escitalopram 20 MG tablet Commonly known as: LEXAPRO Take 20 mg by mouth daily.   famotidine 20 MG tablet Commonly known as: PEPCID Take 20 mg by mouth daily.   furosemide 40 MG tablet Commonly known as: LASIX Take 1 tablet (40 mg total) by mouth daily.   ipratropium-albuterol 0.5-2.5 (3) MG/3ML Soln Commonly known as: DUONEB Take 3 mLs by nebulization every 6 (six) hours as needed (for wheezing/shortness of breath.).   Combivent Respimat 20-100 MCG/ACT Aers respimat Generic drug: Ipratropium-Albuterol Inhale 1 puff into the lungs every 6 (six) hours as needed (wheezing/shortness of breath.).   predniSONE 20 MG tablet Commonly known as: DELTASONE Take 20 mg by mouth daily.   rOPINIRole 2 MG 24 hr tablet Commonly known as: REQUIP XL Take 2 mg by mouth at bedtime.   spironolactone 25 MG tablet Commonly known as: ALDACTONE Take 1 tablet (25 mg total) by mouth daily.   Trelegy Ellipta 100-62.5-25 MCG/INH Aepb Generic drug: Fluticasone-Umeclidin-Vilant Inhale 1 puff into the lungs daily.      Follow-up Information    Rusty Aus, MD  Follow up in 1 week(s).   Specialty: Internal Medicine Contact information: [place] [address] [phone]        Wellington Hampshire, MD Follow up in 2 week(s).   Specialty: Cardiology Contact information: [place] [address] [phone]        Ottie Glazier, MD Follow up in 2 week(s).   Specialty: Pulmonary Disease Contact information: [place] [address] [phone]         40 minutes  Signed: Sharen Hones 01/22/2020, 2:20 PM

## 2020-01-22 NOTE — Discharge Instructions (Signed)
1.  Follow-up with PCP in 1 week. 2.  Follow-up with pulmonology in 2 weeks 3.  Follow-up with cardiology in 2 weeks 4.  Daily weight, call PCP if weight gain is sitting 2 pounds in 2 days. 5.  Fluid restriction less than 1500 mL/day.  Limit her salt intake.

## 2020-01-22 NOTE — Progress Notes (Signed)
ANTICOAGULATION CONSULT NOTE  Pharmacy Consult for heparin Indication: atrial fibrillation  Patient Measurements: Height: _0  (165.1 cm) Weight: 119.2 kg (262 lb 12.6 oz) IBW/kg (Calculated) : 57 Heparin Dosing Weight: 82 kg  Vital Signs: Temp: 97.5 F (36.4 C) (05/21 0000) Temp Source: Oral (05/21 0000) BP: 143/70 (05/21 0000) Pulse Rate: 69 (05/21 0000)  Labs: Recent Labs    01/19/20 0433 01/19/20 0433 01/19/20 1346 01/20/20 0432 01/20/20 2335 01/21/20 1050 01/21/20 2225  HGB 12.2   < >  --  11.1*  --  12.0  --   HCT 40.6  --   --  36.5  --  38.5  --   PLT 332  --   --  298  --  290  --   APTT  --   --   --   --   --  41*  --   LABPROT  --   --   --   --   --  12.0  --   INR  --   --   --   --   --  0.9  --   HEPARINUNFRC  --   --   --   --   --  <0.10* 0.53  CREATININE 0.76   < > 0.91 0.83  --  1.02*  --   TROPONINIHS  --   --   --   --  15  --   --    < > = values in this interval not displayed.    Estimated Creatinine Clearance: 68.3 mL/min (A) (by C-G formula based on SCr of 1.02 mg/dL (H)).   Medical History: Past Medical History:  Diagnosis Date  . CHF (congestive heart failure) ([place])   . COPD (chronic obstructive pulmonary disease) ([place])   . Hypertension     Medications:  Scheduled:  . ambrisentan  5 mg Oral Daily  . aspirin  81 mg Oral Daily  . atorvastatin  20 mg Oral QHS  . chlorhexidine  15 mL Mouth Rinse BID  . Chlorhexidine Gluconate Cloth  6 each Topical Daily  . doxycycline  100 mg Oral Q12H  . escitalopram  20 mg Oral QHS  . famotidine  20 mg Oral Daily  . furosemide  40 mg Intravenous Daily  . insulin aspart  0-15 Units Subcutaneous TID WC  . insulin aspart  0-5 Units Subcutaneous QHS  . mouth rinse  15 mL Mouth Rinse q12n4p  . predniSONE  40 mg Oral Q breakfast    Assessment: Patient admitted x SOB/tachypnea w/ h/o COPD, CHF, HTN presenting w/ afib w/ RVR on EKG, last troponin on this admit was WNL, baseline CBC WNL, aPTT/INR  pending. Patient is not on any anticoagulation PTA. Patient is being started on heparin drip for anticoagulation of new onset afib w/ CHADS-VASc = 4. H&H, platelets wnl  Heparin Course: 5/20 initiation: 2000 unit bolus, then 1150 units/hr 5/20 1050 HL<0.10:   Goal of Therapy:  Heparin level 0.3-0.7 units/ml Monitor platelets by anticoagulation protocol: Yes   Plan:  05/20 @ 2200 HL 0.53 therapeutic. Will continue current rate and will recheck HL at 0600, CBC stable will continue to monitor.  Tobie Lords, PharmD, BCPS Clinical Pharmacist 01/22/2020,12:32 AM

## 2020-01-22 NOTE — TOC Transition Note (Signed)
Transition of Care [facility]) - CM/SW Discharge Note   Patient Details  Name: Paula Torres MRN: [id number] Date of Birth: September 18, 1950  [facility] CM/SW Contact:  Magnus Ivan, LCSW Phone Number: 01/22/2020, 12:58 PM   Clinical Narrative:   Per rounds, patient to discharge home today. CSW spoke with patient at bedside. Patient reported she lives with her husband and daughter and they provide transportation for her. Patient reported they will give her a ride home if she is discharged today as planned. Patient reported she had [place] through [facility] in the past, but not currently (confirmed with [facility] Representative Corene Cornea). Patient said she has oxygen, rollator, and shower chair at home. CSW discussed notes from previous hospitalization about trying to get patient a trilogy and informed patient that per [employer] Representative, patient would qualify for a trilogy if needed. Patient reported she remembered this and thinks it was an insurance issue. Patient denied any needs prior to discharge. CSW spoke with Dr. Patsey Berthold who reported she will defer to Dr. Lanney Gins who patient sees as an Outpatient about Trilogy. CSW updated Investment banker, operational.     Final next level of care: Home/Self Care     Patient Goals and CMS Choice Patient states their goals for this hospitalization and ongoing recovery are:: to return home with spouse and daughter CMS Medicare.gov Compare Post Acute Care list provided to:: Patient    Discharge Placement                Patient to be transferred to facility by: family Name of family member notified: patient notified Patient and family notified of of transfer: 01/22/20  Discharge Plan and Services                    Date DME Agency Contacted: 01/22/20   Representative spoke with at DME Agency: [place] Determinants of Health ([place]) Interventions     Readmission Risk Interventions No flowsheet data  found.

## 2020-01-22 NOTE — Consult Note (Signed)
PHARMACY CONSULT NOTE - FOLLOW UP  Pharmacy Consult for Electrolyte Monitoring and Replacement   Recent Labs: Potassium (mmol/L)  Date Value  01/22/2020 4.0   Magnesium (mg/dL)  Date Value  01/22/2020 2.4   Calcium (mg/dL)  Date Value  01/22/2020 8.9   Albumin (g/dL)  Date Value  01/19/2020 3.9   Phosphorus (mg/dL)  Date Value  01/22/2020 4.6   Sodium (mmol/L)  Date Value  01/22/2020 141     Assessment: 69 year old female admitted with COPD and acute CHF, and later developed new onset atrial fibrillation with RVR requiring treatment with amiodarone, and is also receiving scheduled Lasix.  She is currently taking medications PO.   Goal of Therapy:  Replace electrolytes to goal potassium ~4, and goal magnesium ~2.  All other electrolytes WNL.  Plan:  No replacement indicated at this time. Plan to check electrolytes with AM labs.  Pharmacy will continue to monitor and adjust per consult.  Gerald Dexter, PharmD 01/22/2020 11:39 AM

## 2020-02-16 ENCOUNTER — Encounter: Payer: Self-pay | Admitting: Emergency Medicine

## 2020-02-16 ENCOUNTER — Other Ambulatory Visit: Payer: Self-pay

## 2020-02-16 ENCOUNTER — Emergency Department: Payer: PRIVATE HEALTH INSURANCE

## 2020-02-16 DIAGNOSIS — I110 Hypertensive heart disease with heart failure: Secondary | ICD-10-CM | POA: Insufficient documentation

## 2020-02-16 DIAGNOSIS — Z79899 Other long term (current) drug therapy: Secondary | ICD-10-CM | POA: Diagnosis not present

## 2020-02-16 DIAGNOSIS — I5032 Chronic diastolic (congestive) heart failure: Secondary | ICD-10-CM | POA: Insufficient documentation

## 2020-02-16 DIAGNOSIS — Z87891 Personal history of nicotine dependence: Secondary | ICD-10-CM | POA: Diagnosis not present

## 2020-02-16 DIAGNOSIS — Z7901 Long term (current) use of anticoagulants: Secondary | ICD-10-CM | POA: Diagnosis not present

## 2020-02-16 DIAGNOSIS — S42202A Unspecified fracture of upper end of left humerus, initial encounter for closed fracture: Secondary | ICD-10-CM | POA: Insufficient documentation

## 2020-02-16 DIAGNOSIS — Y999 Unspecified external cause status: Secondary | ICD-10-CM | POA: Insufficient documentation

## 2020-02-16 DIAGNOSIS — Y939 Activity, unspecified: Secondary | ICD-10-CM | POA: Insufficient documentation

## 2020-02-16 DIAGNOSIS — S4992XA Unspecified injury of left shoulder and upper arm, initial encounter: Secondary | ICD-10-CM | POA: Diagnosis present

## 2020-02-16 DIAGNOSIS — J449 Chronic obstructive pulmonary disease, unspecified: Secondary | ICD-10-CM | POA: Insufficient documentation

## 2020-02-16 DIAGNOSIS — Y929 Unspecified place or not applicable: Secondary | ICD-10-CM | POA: Diagnosis not present

## 2020-02-16 DIAGNOSIS — W010XXA Fall on same level from slipping, tripping and stumbling without subsequent striking against object, initial encounter: Secondary | ICD-10-CM | POA: Diagnosis not present

## 2020-02-16 NOTE — ED Triage Notes (Signed)
Pt to triage via w/c with no distress noted, mask in place; EMS brought pt in from home; [address] going up steps in garage and fell on left upper arm; denies hitting head

## 2020-02-17 ENCOUNTER — Emergency Department: Payer: PRIVATE HEALTH INSURANCE

## 2020-02-17 ENCOUNTER — Emergency Department
Admission: EM | Admit: 2020-02-17 | Discharge: 2020-02-17 | Disposition: A | Discharge status: Home / Self Care | Payer: PRIVATE HEALTH INSURANCE | Attending: Emergency Medicine | Admitting: Emergency Medicine

## 2020-02-17 DIAGNOSIS — S42202A Unspecified fracture of upper end of left humerus, initial encounter for closed fracture: Secondary | ICD-10-CM

## 2020-02-17 DIAGNOSIS — W19XXXA Unspecified fall, initial encounter: Secondary | ICD-10-CM

## 2020-02-17 MED ORDER — OXYCODONE-ACETAMINOPHEN 5-325 MG PO TABS
1.0000 | ORAL_TABLET | Freq: Once | ORAL | Status: AC
  Administered 2020-02-17: 1 via ORAL
  Filled 2020-02-17: qty 1

## 2020-02-17 MED ORDER — ONDANSETRON 4 MG PO TBDP
4.0000 mg | ORAL_TABLET | Freq: Three times a day (TID) | ORAL | 0 refills | Status: DC | PRN
Start: 2020-02-17 — End: 2020-05-09

## 2020-02-17 MED ORDER — MORPHINE SULFATE (PF) 4 MG/ML IV SOLN
4.0000 mg | Freq: Once | INTRAVENOUS | Status: AC
  Administered 2020-02-17: 4 mg via INTRAMUSCULAR
  Filled 2020-02-17: qty 1

## 2020-02-17 MED ORDER — OXYCODONE-ACETAMINOPHEN 5-325 MG PO TABS: 1.0000 | ORAL_TABLET | ORAL | 0 refills | Status: DC | PRN

## 2020-02-17 MED ORDER — ONDANSETRON 4 MG PO TBDP
4.0000 mg | ORAL_TABLET | Freq: Once | ORAL | Status: AC
  Administered 2020-02-17: 4 mg via ORAL
  Filled 2020-02-17: qty 1

## 2020-02-17 MED ORDER — KETOROLAC TROMETHAMINE 60 MG/2ML IM SOLN
30.0000 mg | Freq: Once | INTRAMUSCULAR | Status: AC
  Administered 2020-02-17: 30 mg via INTRAMUSCULAR
  Filled 2020-02-17: qty 2

## 2020-02-17 NOTE — ED Notes (Signed)
Pt transported to CT ?

## 2020-02-17 NOTE — ED Provider Notes (Signed)
[facility] Emergency Department Provider Note   ____________________________________________   First MD Initiated Contact with Patient 02/17/20 0023     (approximate)  I have reviewed the triage vital signs and the nursing notes.   HISTORY  Chief Complaint Arm Injury    HPI Crista Nuon is a 69 y.o. female who presents to the ED from home with a chief complaint of fall with left shoulder pain. Patient reports tripping over her own feet and fell, striking her left, nondominant arm. Denies striking head or LOC. Patient has a history of COPD, CHF on continuous oxygenation, hypertension, atrial fibrillation on Eliquis. Complains of left shoulder/humerus pain. Denies headache, neck pain, chest pain, shortness of breath, abdominal pain, nausea, vomiting or dizziness.       Past Medical History:  Diagnosis Date  . CHF (congestive heart failure) ([place])   . COPD (chronic obstructive pulmonary disease) ([place])   . Hypertension     Patient Active Problem List   Diagnosis Date Noted  . Atrial fibrillation with RVR ([place]) 01/21/2020  . 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[place]) 01/21/2020  . Abnormal cardiovascular stress test   . Demand ischemia ([place])   . Hyperlipidemia   . COPD with acute exacerbation ([place]) 11/08/2019  . Acute on chronic diastolic CHF (congestive heart failure) ([place]) 11/08/2019  . Acute respiratory failure with hypoxia and hypercapnia ([place]) 11/08/2019  . Morbid obesity with BMI of 50.0-59.9, adult ([place]) 11/08/2019  . Elevated troponin 11/08/2019    Past Surgical History:  Procedure Laterality Date  . NO PAST SURGERIES    . RIGHT/LEFT HEART CATH AND CORONARY ANGIOGRAPHY Left 12/21/2019   Procedure: RIGHT/LEFT HEART CATH AND CORONARY ANGIOGRAPHY;  Surgeon: Wellington Hampshire, MD;  Location: [place] [facility];  Service: Cardiovascular;  Laterality: Left;    Prior to Admission medications   Medication Sig Start Date End Date  Taking? Authorizing Provider  albuterol (VENTOLIN HFA) 108 (90 Base) MCG/ACT inhaler Inhale 2 puffs into the lungs every 6 (six) hours as needed for wheezing or shortness of breath.    [provider]  ambrisentan (LETAIRIS) 5 MG tablet Take 1 tablet (5 mg total) by mouth daily. 01/22/20   Sharen Hones, MD  amiodarone (PACERONE) 400 MG tablet Take 1 tablet (400 mg total) by mouth 2 (two) times daily for 6 days, THEN 1 tablet (400 mg total) 2 (two) times daily for 7 days, THEN 1 tablet (400 mg total) daily. 01/22/20 03/05/20  Sharen Hones, MD  apixaban (ELIQUIS) 5 MG TABS tablet Take 1 tablet (5 mg total) by mouth 2 (two) times daily. 01/22/20   Sharen Hones, MD  atorvastatin (LIPITOR) 20 MG tablet Take 1 tablet (20 mg total) by mouth daily at 6 PM. 11/23/19   Loel Dubonnet, NP  escitalopram (LEXAPRO) 20 MG tablet Take 20 mg by mouth daily.    [provider]  famotidine (PEPCID) 20 MG tablet Take 20 mg by mouth daily.    [provider]  furosemide (LASIX) 40 MG tablet Take 1 tablet (40 mg total) by mouth daily. 11/23/19   Loel Dubonnet, NP  Ipratropium-Albuterol (COMBIVENT RESPIMAT) 20-100 MCG/ACT AERS respimat Inhale 1 puff into the lungs every 6 (six) hours as needed (wheezing/shortness of breath.).     [provider]  ipratropium-albuterol (DUONEB) 0.5-2.5 (3) MG/3ML SOLN Take 3 mLs by nebulization every 6 (six) hours as needed (for wheezing/shortness of breath.).     [provider]  ondansetron (ZOFRAN ODT)  4 MG disintegrating tablet Take 1 tablet (4 mg total) by mouth every 8 (eight) hours as needed for nausea or vomiting. 02/17/20   Paulette Blanch, MD  oxyCODONE-acetaminophen (PERCOCET/ROXICET) 5-325 MG tablet Take 1 tablet by mouth every 4 (four) hours as needed for severe pain. 02/17/20   Paulette Blanch, MD  rOPINIRole (REQUIP XL) 2 MG 24 hr tablet Take 2 mg by mouth at bedtime.    [provider]  spironolactone (ALDACTONE) 25 MG tablet Take  1 tablet (25 mg total) by mouth daily. 11/23/19   Loel Dubonnet, NP  TRELEGY ELLIPTA 100-62.5-25 MCG/INH AEPB Inhale 1 puff into the lungs daily. 11/20/19   [provider]    Allergies Codeine and Sulfa antibiotics  Family History  Problem Relation Age of Onset  . Hypertension Other     Social History Social History   Tobacco Use  . Smoking status: Former Smoker    Types: Cigarettes    Quit date: 06/03/2017    Years since quitting: 2.7  . Smokeless tobacco: Never Used  Vaping Use  . Vaping Use: Never used  Substance Use Topics  . Alcohol use: Not Currently  . Drug use: Never    Review of Systems  Constitutional: No fever/chills Eyes: No visual changes. ENT: No sore throat. Cardiovascular: Denies chest pain. Respiratory: Denies shortness of breath. Gastrointestinal: No abdominal pain.  No nausea, no vomiting.  No diarrhea.  No constipation. Genitourinary: Negative for dysuria. Musculoskeletal: Positive for left shoulder/humerus pain. Negative for back pain. Skin: Negative for rash. Neurological: Negative for headaches, focal weakness or numbness.   ____________________________________________   PHYSICAL EXAM:  VITAL SIGNS: ED Triage Vitals  Enc Vitals Group     BP 02/16/20 2337 133/72     Pulse Rate 02/16/20 2337 85     Resp --      Temp 02/16/20 2337 98 F (36.7 C)     Temp Source 02/16/20 2337 Oral     SpO2 02/16/20 2337 93 %     Weight 02/16/20 2335 275 lb (124.7 kg)     Height 02/16/20 2335 _0  (1.651 m)     Head Circumference --      Peak Flow --      Pain Score 02/16/20 2335 10     Pain Loc --      Pain Edu? --      Excl. in [place]? --     Constitutional: Alert and oriented. Uncomfortable appearing and in mild acute distress. Eyes: Conjunctivae are normal. PERRL. EOMI. Head: Atraumatic. Nose: Atraumatic. Mouth/Throat: Mucous membranes are moist. No dental malocclusion. Neck: No stridor.  No cervical spine tenderness to  palpation. Cardiovascular: Normal rate, regular rhythm. Grossly normal heart sounds.  Good peripheral circulation. Respiratory: Wearing nasal cannula oxygen. Normal respiratory effort.  No retractions. Lungs CTAB. Gastrointestinal: Soft and nontender. No distention. No abdominal bruits. No CVA tenderness. Musculoskeletal:  LUE: No obvious deformity. Arm held in adduction and internal rotation. Limited range of motion shoulder secondary to pain. Tender to palpation proximal humerus. No signs of external bruising or lacerations. 2+ radial pulse. Brisk, less than 5-second capillary refill No lower extremity tenderness nor edema.  No joint effusions. Neurologic:  Normal speech and language. No gross focal neurologic deficits are appreciated.  Skin:  Skin is warm, dry and intact. No rash noted. Psychiatric: Mood and affect are normal. Speech and behavior are normal.  ____________________________________________   LABS (all labs ordered are listed, but only abnormal results  are displayed)  Labs Reviewed - No data to display ____________________________________________  EKG  None ____________________________________________  RADIOLOGY  ED MD interpretation: Comminuted and displaced proximal left humerus fracture  Official radiology report(s): CT Shoulder Left Wo Contrast  Result Date: 02/17/2020 CLINICAL DATA:  Fall, arm pain EXAM: CT OF THE UPPER LEFT EXTREMITY WITHOUT CONTRAST TECHNIQUE: Multidetector CT imaging of the upper left extremity was performed according to the standard protocol. COMPARISON:  Radiograph same day FINDINGS: Bones/Joint/Cartilage There is extensively comminuted displaced fracture of the proximal humerus involving the humeral head, anatomic neck and greater tuberosity. The humeral shaft is anteriorly and superiorly subluxed with impaction upon the anterior glenoid surface. The humeral head appears to be rotated and posteriorly subluxed. Small fracture fragment seen  within the medial and lateral joint space. There is a moderate glenohumeral joint effusion present. Ligaments Suboptimally assessed by CT. Muscles and Tendons There is diffuse edema seen within the deltoid musculature. The remainder of the muscles appear to be grossly intact. Limited visualization of the tendons. Soft tissues Soft tissue swelling seen around the lateral aspect of the shoulder. IMPRESSION: Comminuted displaced fracture of the proximal humerus as described above. Moderate glenohumeral joint effusion. Electronically Signed   By: Prudencio Pair M.D.   On: 02/17/2020 01:13   DG Humerus Left  Result Date: 02/17/2020 CLINICAL DATA:  Fall EXAM: LEFT HUMERUS - 2+ VIEW COMPARISON:  None. FINDINGS: There is a fracture through the left humeral neck and greater tuberosity. Medial displacement of the humeral shaft relative to the head. No subluxation or dislocation. IMPRESSION: Comminuted displaced proximal left humeral fracture. Electronically Signed   By: Rolm Baptise M.D.   On: 02/17/2020 00:06    ____________________________________________   PROCEDURES  Procedure(s) performed (including Critical Care):  Procedures   ____________________________________________   INITIAL IMPRESSION / ASSESSMENT AND PLAN / ED COURSE  As part of my medical decision making, I reviewed the following data within the [place] notes reviewed and incorporated, Radiograph reviewed, Notes from prior ED visits and [place] Controlled Substance [place] was evaluated in Emergency Department on 02/17/2020 for the symptoms described in the history of present illness. She was evaluated in the context of the global COVID-19 pandemic, which necessitated consideration that the patient might be at risk for infection with the SARS-CoV-2 virus that causes COVID-19. Institutional protocols and algorithms that pertain to the evaluation of patients at risk for COVID-19 are in a state of  rapid change based on information released by regulatory bodies including the CDC and federal and state organizations. These policies and algorithms were followed during the patient's care in the ED.    69 year old female who presents with left humerus pain status post mechanical fall. Differential diagnosis includes but is not limited to dislocation, fracture, musculoskeletal injury, etc.  X-ray imaging demonstrates comminuted proximal humerus fracture. Will administer IM Morphine for pain control and discuss with orthopedics.   Clinical Course as of Feb 17 132  Wed Feb 17, 2020  0038 Spoke with orthopedics on-call Dr. Posey Pronto who reviewed patient's x-rays.  Recommends CT scan, sling and will see patient in the office.   [JS]  0130 Updated patient of CT scan results. Will add IM Toradol, oral Percocet at this time. Strict return precautions given. Patient verbalizes understanding and agrees with plan of care.   [JS]    Clinical Course User Index [JS] Paulette Blanch, MD     ____________________________________________   FINAL CLINICAL IMPRESSION(S) /  ED DIAGNOSES  Final diagnoses:  Fall, initial encounter  Closed fracture of proximal end of left humerus, unspecified fracture morphology, initial encounter     ED Discharge Orders         Ordered    oxyCODONE-acetaminophen (PERCOCET/ROXICET) 5-325 MG tablet  Every 4 hours PRN     Discontinue  Reprint     02/17/20 0131    ondansetron (ZOFRAN ODT) 4 MG disintegrating tablet  Every 8 hours PRN     Discontinue  Reprint     02/17/20 0131           Note:  This document was prepared using Dragon voice recognition software and may include unintentional dictation errors.   Paulette Blanch, MD 02/17/20 [id number]

## 2020-02-17 NOTE — Discharge Instructions (Signed)
1. You may take Percocet and Zofran as needed for pain and nausea. 2. Keep sling in place. 3. Return to the ER for worsening symptoms, persistent vomiting, difficulty breathing or other concerns.

## 2020-02-22 ENCOUNTER — Telehealth: Payer: Self-pay | Admitting: Cardiovascular Disease

## 2020-02-22 ENCOUNTER — Other Ambulatory Visit: Payer: Self-pay | Admitting: *Deleted

## 2020-02-22 MED ORDER — APIXABAN 5 MG PO TABS: 5.0000 mg | ORAL_TABLET | Freq: Two times a day (BID) | ORAL | 2 refills | Status: DC

## 2020-02-22 NOTE — Telephone Encounter (Signed)
Attempted to scheduled hospital fu visit.  Patient declined at this time due to shoulder fx.   Will attempt another time.

## 2020-05-09 ENCOUNTER — Inpatient Hospital Stay
Admission: EM | Admit: 2020-05-09 | Discharge: 2020-05-12 | DRG: 291 | Disposition: A | Discharge status: Home Health | Payer: Medicare Other | Attending: Internal Medicine | Admitting: Internal Medicine

## 2020-05-09 ENCOUNTER — Other Ambulatory Visit: Payer: Self-pay

## 2020-05-09 ENCOUNTER — Emergency Department: Payer: Medicare Other

## 2020-05-09 DIAGNOSIS — F329 Major depressive disorder, single episode, unspecified: Secondary | ICD-10-CM | POA: Diagnosis not present

## 2020-05-09 DIAGNOSIS — Z882 Allergy status to sulfonamides status: Secondary | ICD-10-CM

## 2020-05-09 DIAGNOSIS — I272 Pulmonary hypertension, unspecified: Secondary | ICD-10-CM | POA: Diagnosis present

## 2020-05-09 DIAGNOSIS — F32A Depression, unspecified: Secondary | ICD-10-CM | POA: Diagnosis present

## 2020-05-09 DIAGNOSIS — G2581 Restless legs syndrome: Secondary | ICD-10-CM | POA: Diagnosis present

## 2020-05-09 DIAGNOSIS — I5033 Acute on chronic diastolic (congestive) heart failure: Secondary | ICD-10-CM | POA: Diagnosis present

## 2020-05-09 DIAGNOSIS — E66813 Obesity, class 3: Secondary | ICD-10-CM

## 2020-05-09 DIAGNOSIS — J9602 Acute respiratory failure with hypercapnia: Secondary | ICD-10-CM | POA: Diagnosis not present

## 2020-05-09 DIAGNOSIS — J9621 Acute and chronic respiratory failure with hypoxia: Secondary | ICD-10-CM | POA: Diagnosis present

## 2020-05-09 DIAGNOSIS — Z6841 Body Mass Index (BMI) 40.0 and over, adult: Secondary | ICD-10-CM | POA: Diagnosis not present

## 2020-05-09 DIAGNOSIS — Z7951 Long term (current) use of inhaled steroids: Secondary | ICD-10-CM | POA: Diagnosis not present

## 2020-05-09 DIAGNOSIS — Z7901 Long term (current) use of anticoagulants: Secondary | ICD-10-CM | POA: Diagnosis not present

## 2020-05-09 DIAGNOSIS — Z79899 Other long term (current) drug therapy: Secondary | ICD-10-CM | POA: Diagnosis not present

## 2020-05-09 DIAGNOSIS — K219 Gastro-esophageal reflux disease without esophagitis: Secondary | ICD-10-CM | POA: Diagnosis present

## 2020-05-09 DIAGNOSIS — Z885 Allergy status to narcotic agent status: Secondary | ICD-10-CM

## 2020-05-09 DIAGNOSIS — I480 Paroxysmal atrial fibrillation: Secondary | ICD-10-CM | POA: Diagnosis present

## 2020-05-09 DIAGNOSIS — E785 Hyperlipidemia, unspecified: Secondary | ICD-10-CM

## 2020-05-09 DIAGNOSIS — Z20822 Contact with and (suspected) exposure to covid-19: Secondary | ICD-10-CM | POA: Diagnosis present

## 2020-05-09 DIAGNOSIS — J441 Chronic obstructive pulmonary disease with (acute) exacerbation: Secondary | ICD-10-CM | POA: Diagnosis present

## 2020-05-09 DIAGNOSIS — D72829 Elevated white blood cell count, unspecified: Secondary | ICD-10-CM | POA: Diagnosis present

## 2020-05-09 DIAGNOSIS — I110 Hypertensive heart disease with heart failure: Secondary | ICD-10-CM | POA: Diagnosis present

## 2020-05-09 DIAGNOSIS — R0603 Acute respiratory distress: Secondary | ICD-10-CM | POA: Diagnosis present

## 2020-05-09 DIAGNOSIS — J9622 Acute and chronic respiratory failure with hypercapnia: Secondary | ICD-10-CM | POA: Diagnosis not present

## 2020-05-09 DIAGNOSIS — Z87891 Personal history of nicotine dependence: Secondary | ICD-10-CM | POA: Diagnosis not present

## 2020-05-09 DIAGNOSIS — Z8249 Family history of ischemic heart disease and other diseases of the circulatory system: Secondary | ICD-10-CM | POA: Diagnosis not present

## 2020-05-09 DIAGNOSIS — J9601 Acute respiratory failure with hypoxia: Secondary | ICD-10-CM

## 2020-05-09 LAB — CBC WITH DIFFERENTIAL/PLATELET
Abs Immature Granulocytes: 0.04 10*3/uL (ref 0.00–0.07)
Basophils Absolute: 0.1 10*3/uL (ref 0.0–0.1)
Basophils Relative: 1 %
Eosinophils Absolute: 0.6 10*3/uL — ABNORMAL HIGH (ref 0.0–0.5)
Eosinophils Relative: 5 %
HCT: 36.5 % (ref 36.0–46.0)
Hemoglobin: 10.8 g/dL — ABNORMAL LOW (ref 12.0–15.0)
Immature Granulocytes: 0 %
Lymphocytes Relative: 10 %
Lymphs Abs: 1.2 10*3/uL (ref 0.7–4.0)
MCH: 26.7 pg (ref 26.0–34.0)
MCHC: 29.6 g/dL — ABNORMAL LOW (ref 30.0–36.0)
MCV: 90.1 fL (ref 80.0–100.0)
Monocytes Absolute: 0.7 10*3/uL (ref 0.1–1.0)
Monocytes Relative: 6 %
Neutro Abs: 9.6 10*3/uL — ABNORMAL HIGH (ref 1.7–7.7)
Neutrophils Relative %: 78 %
Platelets: 320 10*3/uL (ref 150–400)
RBC: 4.05 MIL/uL (ref 3.87–5.11)
RDW: 15.9 % — ABNORMAL HIGH (ref 11.5–15.5)
WBC: 12.2 10*3/uL — ABNORMAL HIGH (ref 4.0–10.5)
nRBC: 0 % (ref 0.0–0.2)

## 2020-05-09 LAB — BLOOD GAS, VENOUS
Acid-Base Excess: 22.9 mmol/L — ABNORMAL HIGH (ref 0.0–2.0)
Bicarbonate: 53.1 mmol/L — ABNORMAL HIGH (ref 20.0–28.0)
O2 Saturation: 88.1 %
Patient temperature: 37
pCO2, Ven: 94 mmHg (ref 44.0–60.0)
pH, Ven: 7.36 (ref 7.250–7.430)
pO2, Ven: 57 mmHg — ABNORMAL HIGH (ref 32.0–45.0)

## 2020-05-09 LAB — TROPONIN I (HIGH SENSITIVITY)
Troponin I (High Sensitivity): 4 ng/L (ref <18)
Troponin I (High Sensitivity): 6 ng/L (ref <18)

## 2020-05-09 LAB — BASIC METABOLIC PANEL
Anion gap: 11 (ref 5–15)
BUN: 17 mg/dL (ref 8–23)
CO2: 40 mmol/L — ABNORMAL HIGH (ref 22–32)
Calcium: 9.1 mg/dL (ref 8.9–10.3)
Chloride: 89 mmol/L — ABNORMAL LOW (ref 98–111)
Creatinine, Ser: 1.01 mg/dL — ABNORMAL HIGH (ref 0.44–1.00)
GFR calc Af Amer: >60 mL/min (ref >60)
GFR calc non Af Amer: 57 mL/min — ABNORMAL LOW (ref >60)
Glucose, Bld: 102 mg/dL — ABNORMAL HIGH (ref 70–99)
Potassium: 3.6 mmol/L (ref 3.5–5.1)
Sodium: 140 mmol/L (ref 135–145)

## 2020-05-09 LAB — SARS CORONAVIRUS 2 BY RT PCR (HOSPITAL ORDER, PERFORMED IN ~~LOC~~ HOSPITAL LAB): SARS Coronavirus 2: NEGATIVE

## 2020-05-09 LAB — BRAIN NATRIURETIC PEPTIDE: B Natriuretic Peptide: 55.6 pg/mL (ref 0.0–100.0)

## 2020-05-09 MED ORDER — ATORVASTATIN CALCIUM 20 MG PO TABS
20.0000 mg | ORAL_TABLET | Freq: Every day | ORAL | Status: DC
  Administered 2020-05-10 – 2020-05-11 (×2): 20 mg via ORAL
  Filled 2020-05-09 (×2): qty 1

## 2020-05-09 MED ORDER — FAMOTIDINE 20 MG PO TABS
20.0000 mg | ORAL_TABLET | Freq: Every day | ORAL | Status: DC
  Administered 2020-05-10 – 2020-05-12 (×3): 20 mg via ORAL
  Filled 2020-05-09 (×4): qty 1

## 2020-05-09 MED ORDER — SODIUM CHLORIDE 0.9% FLUSH: 3.0000 mL | INTRAVENOUS | Status: DC | PRN

## 2020-05-09 MED ORDER — METHYLPREDNISOLONE SODIUM SUCC 125 MG IJ SOLR
125.0000 mg | Freq: Once | INTRAMUSCULAR | Status: AC
  Administered 2020-05-09: 125 mg via INTRAVENOUS
  Filled 2020-05-09: qty 2

## 2020-05-09 MED ORDER — APIXABAN 5 MG PO TABS
5.0000 mg | ORAL_TABLET | Freq: Two times a day (BID) | ORAL | Status: DC
  Administered 2020-05-10 – 2020-05-12 (×5): 5 mg via ORAL
  Filled 2020-05-09: qty 1
  Filled 2020-05-09: qty 2
  Filled 2020-05-09 (×4): qty 1

## 2020-05-09 MED ORDER — ZOLPIDEM TARTRATE 5 MG PO TABS
5.0000 mg | ORAL_TABLET | Freq: Every evening | ORAL | Status: DC | PRN
  Administered 2020-05-10: 5 mg via ORAL
  Filled 2020-05-09: qty 1

## 2020-05-09 MED ORDER — ONDANSETRON HCL 4 MG/2ML IJ SOLN: 4.0000 mg | Freq: Four times a day (QID) | INTRAMUSCULAR | Status: DC | PRN

## 2020-05-09 MED ORDER — FUROSEMIDE 10 MG/ML IJ SOLN
40.0000 mg | Freq: Two times a day (BID) | INTRAMUSCULAR | Status: DC
  Administered 2020-05-10 – 2020-05-12 (×5): 40 mg via INTRAVENOUS
  Filled 2020-05-09 (×6): qty 4

## 2020-05-09 MED ORDER — SODIUM CHLORIDE 0.9 % IV SOLN: 250.0000 mL | INTRAVENOUS | Status: DC | PRN

## 2020-05-09 MED ORDER — ESCITALOPRAM OXALATE 10 MG PO TABS
20.0000 mg | ORAL_TABLET | Freq: Every day | ORAL | Status: DC
  Administered 2020-05-10 – 2020-05-12 (×3): 20 mg via ORAL
  Filled 2020-05-09 (×4): qty 2

## 2020-05-09 MED ORDER — ONDANSETRON 4 MG PO TBDP
4.0000 mg | ORAL_TABLET | Freq: Three times a day (TID) | ORAL | Status: DC | PRN
  Filled 2020-05-09: qty 1

## 2020-05-09 MED ORDER — IPRATROPIUM-ALBUTEROL 0.5-2.5 (3) MG/3ML IN SOLN
RESPIRATORY_TRACT | Status: AC
  Filled 2020-05-09: qty 6

## 2020-05-09 MED ORDER — SPIRONOLACTONE 25 MG PO TABS
25.0000 mg | ORAL_TABLET | Freq: Every day | ORAL | Status: DC
  Administered 2020-05-10 – 2020-05-12 (×3): 25 mg via ORAL
  Filled 2020-05-09 (×4): qty 1

## 2020-05-09 MED ORDER — ACETAMINOPHEN 325 MG PO TABS: 650.0000 mg | ORAL_TABLET | ORAL | Status: DC | PRN

## 2020-05-09 MED ORDER — ALPRAZOLAM 0.25 MG PO TABS
0.2500 mg | ORAL_TABLET | Freq: Two times a day (BID) | ORAL | Status: DC | PRN
  Administered 2020-05-11 – 2020-05-12 (×2): 0.25 mg via ORAL
  Filled 2020-05-09 (×2): qty 1

## 2020-05-09 MED ORDER — IPRATROPIUM-ALBUTEROL 0.5-2.5 (3) MG/3ML IN SOLN
9.0000 mL | Freq: Once | RESPIRATORY_TRACT | Status: AC
  Administered 2020-05-09: 9 mL via RESPIRATORY_TRACT
  Filled 2020-05-09: qty 3

## 2020-05-09 MED ORDER — AMIODARONE HCL 200 MG PO TABS
200.0000 mg | ORAL_TABLET | Freq: Every day | ORAL | Status: DC
  Administered 2020-05-10 – 2020-05-12 (×3): 200 mg via ORAL
  Filled 2020-05-09 (×4): qty 1

## 2020-05-09 MED ORDER — FUROSEMIDE 10 MG/ML IJ SOLN
80.0000 mg | Freq: Once | INTRAMUSCULAR | Status: AC
  Administered 2020-05-09: 80 mg via INTRAVENOUS
  Filled 2020-05-09: qty 8

## 2020-05-09 MED ORDER — ROPINIROLE HCL 1 MG PO TABS
2.0000 mg | ORAL_TABLET | Freq: Every day | ORAL | Status: DC
  Administered 2020-05-10 (×2): 2 mg via ORAL
  Filled 2020-05-09 (×3): qty 2

## 2020-05-09 MED ORDER — OXYCODONE-ACETAMINOPHEN 5-325 MG PO TABS: 1.0000 | ORAL_TABLET | ORAL | Status: DC | PRN

## 2020-05-09 MED ORDER — SODIUM CHLORIDE 0.9% FLUSH
3.0000 mL | Freq: Two times a day (BID) | INTRAVENOUS | Status: DC
  Administered 2020-05-09 – 2020-05-12 (×6): 3 mL via INTRAVENOUS

## 2020-05-09 MED ORDER — IPRATROPIUM-ALBUTEROL 0.5-2.5 (3) MG/3ML IN SOLN
3.0000 mL | Freq: Four times a day (QID) | RESPIRATORY_TRACT | Status: DC
  Administered 2020-05-10 – 2020-05-12 (×10): 3 mL via RESPIRATORY_TRACT
  Filled 2020-05-09 (×11): qty 3

## 2020-05-09 NOTE — ED Triage Notes (Signed)
Pt here from home via [facility].   Pt c/o shortness of breath x1 day, pt usually on 3L chronic [place]. Pt currently satting at 99 % on 3L [place]. Pt administered x2 nebs at home without relief. Pt received 1 duoneb with EMS.  Pt has diminished lung sounds throughout.

## 2020-05-09 NOTE — ED Notes (Signed)
Daughter at bedside. Requesting to have pharmacy tech come to bedside to verify meds w daughter. Pharmacy tech notified by Comptroller.

## 2020-05-09 NOTE — ED Provider Notes (Signed)
[facility] Emergency Department Provider Note   ____________________________________________   First MD Initiated Contact with Patient 05/09/20 1827     (approximate)  I have reviewed the triage vital signs and the nursing notes.   HISTORY  Chief Complaint Shortness of Breath    HPI Paula Torres is a 69 y.o. female with possible history of hypertension, hyperlipidemia, COPD, CHF, atrial fibrillation, and severe pulmonary hypertension who presents to the ED complaining of shortness of breath.  Patient reports that she has had increasing shortness of breath throughout the day today.  She gave herself 2 nebulized albuterol treatments at home without significant relief, received an additional DuoNeb with EMS.  EMS reports that patient's O2 sats were stable on her usual 3 L nasal cannula.  Patient states she is feeling short of breath on arrival, but denies any recent fevers, cough, or chest pain.  She does state that her legs have been more swollen than usual despite her taking her diuretic.        Past Medical History:  Diagnosis Date  . CHF (congestive heart failure) ([place])   . COPD (chronic obstructive pulmonary disease) ([place])   . Hypertension     Patient Active Problem List   Diagnosis Date Noted  . COPD exacerbation ([place]) 05/09/2020  . Atrial fibrillation with RVR ([place]) 01/21/2020  . 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[place]) 01/21/2020  . Abnormal cardiovascular stress test   . Demand ischemia ([place])   . Hyperlipidemia   . COPD with acute exacerbation ([place]) 11/08/2019  . Acute on chronic diastolic CHF (congestive heart failure) ([place]) 11/08/2019  . Acute respiratory failure with hypoxia and hypercapnia ([place]) 11/08/2019  . Morbid obesity with BMI of 50.0-59.9, adult ([place]) 11/08/2019  . Elevated troponin 11/08/2019    Past Surgical History:  Procedure Laterality Date  . NO PAST SURGERIES    . RIGHT/LEFT HEART CATH AND CORONARY  ANGIOGRAPHY Left 12/21/2019   Procedure: RIGHT/LEFT HEART CATH AND CORONARY ANGIOGRAPHY;  Surgeon: Wellington Hampshire, MD;  Location: [place] [facility];  Service: Cardiovascular;  Laterality: Left;    Prior to Admission medications   Medication Sig Start Date End Date Taking? Authorizing Provider  albuterol (VENTOLIN HFA) 108 (90 Base) MCG/ACT inhaler Inhale 2 puffs into the lungs every 6 (six) hours as needed for wheezing or shortness of breath.    [provider]  ambrisentan (LETAIRIS) 5 MG tablet Take 1 tablet (5 mg total) by mouth daily. 01/22/20   Sharen Hones, MD  amiodarone (PACERONE) 400 MG tablet Take 1 tablet (400 mg total) by mouth 2 (two) times daily for 6 days, THEN 1 tablet (400 mg total) 2 (two) times daily for 7 days, THEN 1 tablet (400 mg total) daily. 01/22/20 03/05/20  Sharen Hones, MD  apixaban (ELIQUIS) 5 MG TABS tablet Take 1 tablet (5 mg total) by mouth 2 (two) times daily. 02/22/20   Dunn, Areta Haber, PA-C  atorvastatin (LIPITOR) 20 MG tablet Take 1 tablet (20 mg total) by mouth daily at 6 PM. 11/23/19   Loel Dubonnet, NP  escitalopram (LEXAPRO) 20 MG tablet Take 20 mg by mouth daily.    [provider]  famotidine (PEPCID) 20 MG tablet Take 20 mg by mouth daily.    [provider]  furosemide (LASIX) 40 MG tablet Take 1 tablet (40 mg total) by mouth daily. 11/23/19   Loel Dubonnet, NP  Ipratropium-Albuterol (COMBIVENT RESPIMAT) 20-100 MCG/ACT AERS respimat Inhale 1 puff into the  lungs every 6 (six) hours as needed (wheezing/shortness of breath.).     [provider]  ipratropium-albuterol (DUONEB) 0.5-2.5 (3) MG/3ML SOLN Take 3 mLs by nebulization every 6 (six) hours as needed (for wheezing/shortness of breath.).     [provider]  ondansetron (ZOFRAN ODT) 4 MG disintegrating tablet Take 1 tablet (4 mg total) by mouth every 8 (eight) hours as needed for nausea or vomiting. 02/17/20   Paulette Blanch, MD  oxyCODONE-acetaminophen  (PERCOCET/ROXICET) 5-325 MG tablet Take 1 tablet by mouth every 4 (four) hours as needed for severe pain. 02/17/20   Paulette Blanch, MD  rOPINIRole (REQUIP XL) 2 MG 24 hr tablet Take 2 mg by mouth at bedtime.    [provider]  spironolactone (ALDACTONE) 25 MG tablet Take 1 tablet (25 mg total) by mouth daily. 11/23/19   Loel Dubonnet, NP  TRELEGY ELLIPTA 100-62.5-25 MCG/INH AEPB Inhale 1 puff into the lungs daily. 11/20/19   [provider]    Allergies Codeine and Sulfa antibiotics  Family History  Problem Relation Age of Onset  . Hypertension Other     Social History Social History   Tobacco Use  . Smoking status: Former Smoker    Types: Cigarettes    Quit date: 06/03/2017    Years since quitting: 2.9  . Smokeless tobacco: Never Used  Vaping Use  . Vaping Use: Never used  Substance Use Topics  . Alcohol use: Not Currently  . Drug use: Never    Review of Systems  Constitutional: No fever/chills Eyes: No visual changes. ENT: No sore throat. Cardiovascular: Denies chest pain. Respiratory: Positive for shortness of breath. Gastrointestinal: No abdominal pain.  No nausea, no vomiting.  No diarrhea.  No constipation. Genitourinary: Negative for dysuria. Musculoskeletal: Negative for back pain. Skin: Negative for rash. Neurological: Negative for headaches, focal weakness or numbness.  ____________________________________________   PHYSICAL EXAM:  VITAL SIGNS: ED Triage Vitals [05/09/20 1829]  Enc Vitals Group     BP      Pulse      Resp      Temp      Temp src      SpO2      Weight 275 lb 9.2 oz (125 kg)     Height _0  (1.651 m)     Head Circumference      Peak Flow      Pain Score 0     Pain Loc      Pain Edu?      Excl. in [place]?     Constitutional: Alert and oriented. Eyes: Conjunctivae are normal. Head: Atraumatic. Nose: No congestion/rhinnorhea. Mouth/Throat: Mucous membranes are moist. Neck: Normal ROM Cardiovascular: Normal  rate, regular rhythm. Grossly normal heart sounds. Respiratory: Tachypneic with increased respiratory effort.  No retractions. Lungs with poor air movement and expiratory wheezing throughout. Gastrointestinal: Soft and nontender. No distention. Genitourinary: deferred Musculoskeletal: No lower extremity tenderness, 2+ pitting edema to knees bilaterally. Neurologic:  Normal speech and language. No gross focal neurologic deficits are appreciated. Skin:  Skin is warm, dry and intact. No rash noted. Psychiatric: Mood and affect are normal. Speech and behavior are normal.  ____________________________________________   LABS (all labs ordered are listed, but only abnormal results are displayed)  Labs Reviewed  BASIC METABOLIC PANEL - Abnormal; Notable for the following components:      Result Value   Chloride 89 (*)    CO2 40 (*)    Glucose, Bld 102 (*)  Creatinine, Ser 1.01 (*)    GFR calc non Af Amer 57 (*)    All other components within normal limits  CBC WITH DIFFERENTIAL/PLATELET - Abnormal; Notable for the following components:   WBC 12.2 (*)    Hemoglobin 10.8 (*)    MCHC 29.6 (*)    RDW 15.9 (*)    Neutro Abs 9.6 (*)    Eosinophils Absolute 0.6 (*)    All other components within normal limits  BLOOD GAS, VENOUS - Abnormal; Notable for the following components:   pCO2, Ven 94 (*)    pO2, Ven 57.0 (*)    Bicarbonate 53.1 (*)    Acid-Base Excess 22.9 (*)    All other components within normal limits  SARS CORONAVIRUS 2 BY RT PCR ([place] [facility])  BRAIN NATRIURETIC PEPTIDE  TROPONIN I (HIGH SENSITIVITY)  TROPONIN I (HIGH SENSITIVITY)   ____________________________________________  EKG  ED ECG REPORT I, Blake Divine, the attending physician, personally viewed and interpreted this ECG.   Date: 05/09/2020  EKG Time: 18:32  Rate: 66  Rhythm: normal sinus rhythm  Axis: Normal  Intervals:none  ST&T Change:  None   PROCEDURES  Procedure(s) performed (including Critical Care):  .Critical Care Performed by: Blake Divine, MD Authorized by: Blake Divine, MD   Critical care provider statement:    Critical care time (minutes):  45   Critical care time was exclusive of:  Separately billable procedures and treating other patients and teaching time   Critical care was necessary to treat or prevent imminent or life-threatening deterioration of the following conditions:  Respiratory failure   Critical care was time spent personally by me on the following activities:  Discussions with consultants, evaluation of patient's response to treatment, examination of patient, ordering and performing treatments and interventions, ordering and review of laboratory studies, ordering and review of radiographic studies, pulse oximetry, re-evaluation of patient's condition, obtaining history from patient or surrogate and review of old charts   I assumed direction of critical care for this patient from another provider in my specialty: no       ____________________________________________   INITIAL IMPRESSION / ASSESSMENT AND PLAN / ED COURSE       69 year old female with past medical history of hypertension, hyperlipidemia, COPD on 3 L nasal cannula, CHF, atrial fibrillation, and severe pulmonary hypertension who presents to the ED complaining of increasing shortness of breath over the past 24 hours.  She denies any chest pain and EKG shows no evidence of arrhythmia or ischemia, low suspicion for ACS.  She has poor air movement throughout with wheezing on exam, along with edema in her lower extremities suggesting mixed COPD and CHF exacerbation.  We will treat with additional duo nebs as well as IV Solu-Medrol, further assess with chest x-ray and labs.  She has not been in any respiratory distress at this time, we will check VBG but unless there is significant CO2 retention would hold off on BiPAP at this  time.  VBG shows CO2 of greater than 90 and while there is likely a component of chronic CO2 retention given her normal pH, patient is slightly somnolent at this time.  She arouses easily to voice but CO2 narcosis could be contributing to her somnolence and she was started on BiPAP with improvement in mental status.  Remainder of labs are unremarkable, chest x-ray does show possible pulmonary vascular congestion and given her lower extremity edema we will treat with IV Lasix.  Case  discussed with hospitalist for admission.      ____________________________________________   FINAL CLINICAL IMPRESSION(S) / ED DIAGNOSES  Final diagnoses:  COPD exacerbation ([place])  Acute on chronic diastolic congestive heart failure [facility])     ED Discharge Orders    None       Note:  This document was prepared using Dragon voice recognition software and may include unintentional dictation errors.   Blake Divine, MD 05/09/20 2032

## 2020-05-09 NOTE — H&P (Signed)
PATIENT NAME: Paula Torres    MR#:  [id number]  DATE OF BIRTH:  March 07, 1951  DATE OF ADMISSION:  05/09/2020  PRIMARY CARE PHYSICIAN: Rusty Aus, MD   REQUESTING/REFERRING PHYSICIAN: Blake Divine, MD CHIEF COMPLAINT:   Chief Complaint  Patient presents with  . Shortness of Breath    HISTORY OF PRESENT ILLNESS:  Paula Torres  is a 69 y.o. Caucasian female with a known history of diastolic CHF, COPD and hypertension, atrial fibrillation and severe pulmonary hypertension presented to the emergency room with acute onset of worsening dyspnea with associated dry cough and wheezing for the last day, with no fever or chills.  She took 2 nebulized albuterol treatment at home without significant help and received an additional DuoNeb with EMS.  Her O2 saturation was initially normal on 3 L of O2 by nasal cannula.  She denied chest pain or palpitations.  No nausea or vomiting or abdominal pain.  No hemoptysis or other bleeding diathesis.  She has been having worsening bilateral lower extremity edema despite compliance with her diuretic therapy.  Upon presentation to the emergency room, respiratory rate was 24 and otherwise vital signs were within normal.  Labs revealed ABG with pH 7.36, HCO3 of 53.1 and PCO2 94 with PO2 67 and O2 sat of 88.1%.  She was in significant respiratory distress she was placed on BiPAP.  BMP was remarkable for a CO2 of 40 and her BNP was 55.6 with high-sensitivity troponin I 4.  CBC showed mild leukocytosis of 12.2 with hemoglobin of 10.8 and hematocrit 36.5 slightly lower than previous levels in May and neutrophilia.  Portable chest ray showed cardiomegaly with vascular congestion and interstitial prominence but without overt pulmonary edema. EKG showed normal sinus rhythm with rate 66 with borderline right axis deviation.  The patient was given 80 mg of IV Lasix, DuoNeb and 125 mg of IV Solu-Medrol.  She will be admitted to a progressive unit bed  for further evaluation and management. PAST MEDICAL HISTORY:   Past Medical History:  Diagnosis Date  . CHF (congestive heart failure) ([place])   . COPD (chronic obstructive pulmonary disease) ([place])   . Hypertension   Atrial fibrillation Severe pulmonary hypertension  PAST SURGICAL HISTORY:   Past Surgical History:  Procedure Laterality Date  . NO PAST SURGERIES    . RIGHT/LEFT HEART CATH AND CORONARY ANGIOGRAPHY Left 12/21/2019   Procedure: RIGHT/LEFT HEART CATH AND CORONARY ANGIOGRAPHY;  Surgeon: Wellington Hampshire, MD;  Location: [facility];  Service: Cardiovascular;  Laterality: Left;    SOCIAL HISTORY:   Social History   Tobacco Use  . Smoking status: Former Smoker    Types: Cigarettes    Quit date: 06/03/2017    Years since quitting: 2.9  . Smokeless tobacco: Never Used  Substance Use Topics  . Alcohol use: Not Currently    FAMILY HISTORY:   Family History  Problem Relation Age of Onset  . Hypertension Other     DRUG ALLERGIES:   Allergies  Allergen Reactions  . Codeine     Back Headache  . Sulfa Antibiotics     Reported to pt from mother who is no longer living -  Details unknown    REVIEW OF SYSTEMS:   ROS As per history of present illness. All pertinent systems were reviewed above. Constitutional, HEENT, cardiovascular, respiratory, GI, GU, musculoskeletal, neuro, psychiatric, endocrine, integumentary and hematologic systems were reviewed and are otherwise negative/unremarkable except for  positive findings mentioned above in the HPI.   MEDICATIONS AT HOME:   Prior to Admission medications   Medication Sig Start Date End Date Taking? Authorizing Provider  albuterol (VENTOLIN HFA) 108 (90 Base) MCG/ACT inhaler Inhale 2 puffs into the lungs every 6 (six) hours as needed for wheezing or shortness of breath.    [provider]  ambrisentan (LETAIRIS) 5 MG tablet Take 1 tablet (5 mg total) by mouth daily. 01/22/20   Sharen Hones, MD    amiodarone (PACERONE) 400 MG tablet Take 1 tablet (400 mg total) by mouth 2 (two) times daily for 6 days, THEN 1 tablet (400 mg total) 2 (two) times daily for 7 days, THEN 1 tablet (400 mg total) daily. 01/22/20 03/05/20  Sharen Hones, MD  apixaban (ELIQUIS) 5 MG TABS tablet Take 1 tablet (5 mg total) by mouth 2 (two) times daily. 02/22/20   Dunn, Areta Haber, PA-C  atorvastatin (LIPITOR) 20 MG tablet Take 1 tablet (20 mg total) by mouth daily at 6 PM. 11/23/19   Loel Dubonnet, NP  escitalopram (LEXAPRO) 20 MG tablet Take 20 mg by mouth daily.    [provider]  famotidine (PEPCID) 20 MG tablet Take 20 mg by mouth daily.    [provider]  furosemide (LASIX) 40 MG tablet Take 1 tablet (40 mg total) by mouth daily. 11/23/19   Loel Dubonnet, NP  Ipratropium-Albuterol (COMBIVENT RESPIMAT) 20-100 MCG/ACT AERS respimat Inhale 1 puff into the lungs every 6 (six) hours as needed (wheezing/shortness of breath.).     [provider]  ipratropium-albuterol (DUONEB) 0.5-2.5 (3) MG/3ML SOLN Take 3 mLs by nebulization every 6 (six) hours as needed (for wheezing/shortness of breath.).     [provider]  ondansetron (ZOFRAN ODT) 4 MG disintegrating tablet Take 1 tablet (4 mg total) by mouth every 8 (eight) hours as needed for nausea or vomiting. 02/17/20   Paulette Blanch, MD  oxyCODONE-acetaminophen (PERCOCET/ROXICET) 5-325 MG tablet Take 1 tablet by mouth every 4 (four) hours as needed for severe pain. 02/17/20   Paulette Blanch, MD  rOPINIRole (REQUIP XL) 2 MG 24 hr tablet Take 2 mg by mouth at bedtime.    [provider]  spironolactone (ALDACTONE) 25 MG tablet Take 1 tablet (25 mg total) by mouth daily. 11/23/19   Loel Dubonnet, NP  TRELEGY ELLIPTA 100-62.5-25 MCG/INH AEPB Inhale 1 puff into the lungs daily. 11/20/19   [provider]      VITAL SIGNS:  Blood pressure (!) 143/70, pulse 70, temperature 97.8 F (36.6 C), temperature source Oral, resp. rate 20,  height _0  (1.651 m), weight 125 kg, SpO2 94 %.  PHYSICAL EXAMINATION:  Physical Exam  GENERAL:  69 y.o.-year-old Caucasian female patient lying in the bed with mild respiratory distress on BiPAP.  She was somnolent and slightly difficult to arouse. EYES: Pupils equal, round, reactive to light and accommodation. No scleral icterus. Extraocular muscles intact.  HEENT: Head atraumatic, normocephalic. Oropharynx and nasopharynx clear.  NECK:  Supple, no jugular venous distention. No thyroid enlargement, no tenderness.  LUNGS: Diminished bibasal breath sounds with scattered expiratory wheezes and diminished expiratory airflow with harsh vesicular breathing. CARDIOVASCULAR: Regular rate and rhythm, S1, S2 normal. No murmurs, rubs, or gallops.  ABDOMEN: Soft, nondistended, nontender. Bowel sounds present. No organomegaly or mass.  EXTREMITIES:1-2+ bilateral lower extremity pitting edema with no clubbing or cyanosis.NEUROLOGIC: Cranial nerves II through XII are intact. Muscle strength 5/5 in all extremities. Sensation intact.  Gait not checked.  PSYCHIATRIC: The patient is alert and oriented x 3.  Normal affect and good eye contact. SKIN: She has scattered skin ulcers with scabbing over her abdomen and both upper and lower extremities. LABORATORY PANEL:   CBC Recent Labs  Lab 05/09/20 1848  WBC 12.2*  HGB 10.8*  HCT 36.5  PLT 320   ------------------------------------------------------------------------------------------------------------------  Chemistries  Recent Labs  Lab 05/09/20 1848  NA 140  K 3.6  CL 89*  CO2 40*  GLUCOSE 102*  BUN 17  CREATININE 1.01*  CALCIUM 9.1   ------------------------------------------------------------------------------------------------------------------  Cardiac Enzymes No results for input(s): TROPONINI in the last 168  hours. ------------------------------------------------------------------------------------------------------------------  RADIOLOGY:  DG Chest Portable 1 View  Result Date: 05/09/2020 CLINICAL DATA:  Short of breath for 1 day. EXAM: PORTABLE CHEST 1 VIEW COMPARISON:  01/20/2020 FINDINGS: Mild enlargement of the cardiac silhouette. No mediastinal or hilar masses. Bilateral vascular congestion. Mild interstitial prominence most evident in the lung bases. These findings are similar to the prior study. No lung consolidation. No convincing pleural effusion and no pneumothorax. Displaced fracture of the proximal left humerus, incompletely imaged, new since the prior chest radiograph, but visualized on humerus radiographs from 02/17/2020. IMPRESSION: 1. Cardiomegaly with vascular congestion and interstitial prominence, but without overt pulmonary edema. Findings are similar to the prior chest radiograph. No convincing pneumonia. Electronically Signed   By: Lajean Manes M.D.   On: 05/09/2020 18:57      IMPRESSION AND PLAN:   1.  Acute hypoxic and likely hypercarbic respiratory failure, likely secondary to acute exacerbation of COPD and possibly mild acute on chronic diastolic CHF. -The patient will be admitted to progressive unit bed. -We will place her on scheduled as needed duo nebs. -We will continue steroid therapy with IV Solu-Medrol. -We will add IV Rocephin and Zithromax and obtain sputum Gram stain culture and sensitivity. -Mucolytic therapy will be provided. -We will hold her Trelegy. -She will be diuresed with IV Lasix. -She had a 2D echo in May of this year revealing an EF of 66 5% with indeterminate diastolic left ventricular parameters at that time.  We will therefore not repeated at this time.  2.  Dyslipidemia. -We will continue statin therapy.  3.  Paroxysmal atrial fibrillation, currently in normal sinus rhythm. -We will continue amiodarone and Eliquis.  4.  Depression. -We  will continue Lexapro.  5.  Pulmonary hypertension. -We will continue her Tyvaso.  6.  Restless leg syndrome. -We will continue ropinirole.  7.  DVT prophylaxis. -We will continue Eliquis.    All the records are reviewed and case discussed with ED provider. The plan of care was discussed in details with the patient (and family). I answered all questions. The patient agreed to proceed with the above mentioned plan. Further management will depend upon hospital course.   CODE STATUS: Full code  Status is: Inpatient  Remains inpatient appropriate because:Hemodynamically unstable, Altered mental status, Ongoing diagnostic testing needed not appropriate for outpatient work up, Unsafe d/c plan, IV treatments appropriate due to intensity of illness or inability to take PO and Inpatient level of care appropriate due to severity of illness   Dispo: The patient is from: Home              Anticipated d/c is to: Home              Anticipated d/c date is: 3 days              Patient  currently is not medically stable to d/c.   TOTAL TIME TAKING CARE OF THIS PATIENT: 60 minutes.    Christel Mormon M.D on 05/09/2020 at 8:34 PM  [facility]   From 7 PM-7 AM, contact night-coverage [email or web address]  CC: Primary care physician; Rusty Aus, MD   Note: This dictation was prepared with Dragon dictation along with smaller phrase technology. Any transcriptional typo errors that result from this process are unintentional.

## 2020-05-10 ENCOUNTER — Inpatient Hospital Stay: Payer: Medicare Other

## 2020-05-10 DIAGNOSIS — I272 Pulmonary hypertension, unspecified: Secondary | ICD-10-CM | POA: Diagnosis present

## 2020-05-10 DIAGNOSIS — I5033 Acute on chronic diastolic (congestive) heart failure: Secondary | ICD-10-CM | POA: Insufficient documentation

## 2020-05-10 DIAGNOSIS — G2581 Restless legs syndrome: Secondary | ICD-10-CM | POA: Diagnosis present

## 2020-05-10 DIAGNOSIS — R0603 Acute respiratory distress: Secondary | ICD-10-CM | POA: Insufficient documentation

## 2020-05-10 DIAGNOSIS — F32A Depression, unspecified: Secondary | ICD-10-CM | POA: Diagnosis present

## 2020-05-10 DIAGNOSIS — F329 Major depressive disorder, single episode, unspecified: Secondary | ICD-10-CM

## 2020-05-10 LAB — BLOOD GAS, ARTERIAL
Acid-Base Excess: 22.9 mmol/L — ABNORMAL HIGH (ref 0.0–2.0)
Bicarbonate: 49.9 mmol/L — ABNORMAL HIGH (ref 20.0–28.0)
FIO2: 0.36
O2 Saturation: 92.7 %
Patient temperature: 37
pCO2 arterial: 67 mmHg (ref 32.0–48.0)
pH, Arterial: 7.48 — ABNORMAL HIGH (ref 7.350–7.450)
pO2, Arterial: 61 mmHg — ABNORMAL LOW (ref 83.0–108.0)

## 2020-05-10 LAB — BASIC METABOLIC PANEL
Anion gap: 11 (ref 5–15)
BUN: 19 mg/dL (ref 8–23)
CO2: 41 mmol/L — ABNORMAL HIGH (ref 22–32)
Calcium: 9.2 mg/dL (ref 8.9–10.3)
Chloride: 88 mmol/L — ABNORMAL LOW (ref 98–111)
Creatinine, Ser: 1.16 mg/dL — ABNORMAL HIGH (ref 0.44–1.00)
GFR calc Af Amer: 56 mL/min — ABNORMAL LOW (ref >60)
GFR calc non Af Amer: 48 mL/min — ABNORMAL LOW (ref >60)
Glucose, Bld: 169 mg/dL — ABNORMAL HIGH (ref 70–99)
Potassium: 3.5 mmol/L (ref 3.5–5.1)
Sodium: 140 mmol/L (ref 135–145)

## 2020-05-10 MED ORDER — SODIUM CHLORIDE 0.9 % IV SOLN
500.0000 mg | INTRAVENOUS | Status: DC
  Administered 2020-05-10 – 2020-05-12 (×3): 500 mg via INTRAVENOUS
  Filled 2020-05-10 (×4): qty 500

## 2020-05-10 MED ORDER — HYDROCODONE-ACETAMINOPHEN 5-325 MG PO TABS
1.0000 | ORAL_TABLET | Freq: Three times a day (TID) | ORAL | Status: DC | PRN
  Administered 2020-05-10 – 2020-05-12 (×3): 1 via ORAL
  Filled 2020-05-10 (×3): qty 1

## 2020-05-10 MED ORDER — FERROUS GLUCONATE 324 (38 FE) MG PO TABS
324.0000 mg | ORAL_TABLET | Freq: Every morning | ORAL | Status: DC
  Administered 2020-05-10 – 2020-05-12 (×3): 324 mg via ORAL
  Filled 2020-05-10 (×3): qty 1

## 2020-05-10 MED ORDER — LORATADINE 10 MG PO TABS
10.0000 mg | ORAL_TABLET | Freq: Every day | ORAL | Status: DC
  Administered 2020-05-10 – 2020-05-12 (×3): 10 mg via ORAL
  Filled 2020-05-10 (×3): qty 1

## 2020-05-10 MED ORDER — GUAIFENESIN ER 600 MG PO TB12
600.0000 mg | ORAL_TABLET | Freq: Two times a day (BID) | ORAL | Status: DC
  Administered 2020-05-10: 600 mg via ORAL
  Filled 2020-05-10: qty 1

## 2020-05-10 MED ORDER — PANTOPRAZOLE SODIUM 40 MG PO TBEC
40.0000 mg | DELAYED_RELEASE_TABLET | Freq: Every day | ORAL | Status: DC
  Administered 2020-05-10 – 2020-05-11 (×2): 40 mg via ORAL
  Filled 2020-05-10 (×2): qty 1

## 2020-05-10 MED ORDER — BISOPROLOL FUMARATE 5 MG PO TABS
5.0000 mg | ORAL_TABLET | Freq: Every day | ORAL | Status: DC
  Administered 2020-05-10 – 2020-05-12 (×3): 5 mg via ORAL
  Filled 2020-05-10 (×3): qty 1

## 2020-05-10 MED ORDER — SODIUM CHLORIDE 0.9 % IV SOLN
1.0000 g | INTRAVENOUS | Status: DC
  Administered 2020-05-10 – 2020-05-12 (×3): 1 g via INTRAVENOUS
  Filled 2020-05-10: qty 10
  Filled 2020-05-10: qty 1
  Filled 2020-05-10 (×2): qty 10

## 2020-05-10 MED ORDER — METHYLPREDNISOLONE SODIUM SUCC 40 MG IJ SOLR
40.0000 mg | Freq: Three times a day (TID) | INTRAMUSCULAR | Status: DC
  Administered 2020-05-10 (×2): 40 mg via INTRAVENOUS
  Filled 2020-05-10 (×2): qty 1

## 2020-05-10 MED ORDER — VITAMIN D (ERGOCALCIFEROL) 1.25 MG (50000 UNIT) PO CAPS: 50000.0000 [IU] | ORAL_CAPSULE | ORAL | Status: DC

## 2020-05-10 MED ORDER — BUDESONIDE 0.5 MG/2ML IN SUSP
0.5000 mg | Freq: Two times a day (BID) | RESPIRATORY_TRACT | Status: DC
  Administered 2020-05-11 – 2020-05-12 (×3): 0.5 mg via RESPIRATORY_TRACT
  Filled 2020-05-10 (×3): qty 2

## 2020-05-10 MED ORDER — METHYLPREDNISOLONE SODIUM SUCC 125 MG IJ SOLR
80.0000 mg | Freq: Three times a day (TID) | INTRAMUSCULAR | Status: DC
  Administered 2020-05-10 – 2020-05-11 (×4): 80 mg via INTRAVENOUS
  Filled 2020-05-10 (×4): qty 2

## 2020-05-10 MED ORDER — POTASSIUM CHLORIDE CRYS ER 20 MEQ PO TBCR
40.0000 meq | EXTENDED_RELEASE_TABLET | Freq: Once | ORAL | Status: AC
  Administered 2020-05-10: 40 meq via ORAL
  Filled 2020-05-10: qty 2

## 2020-05-10 MED ORDER — GUAIFENESIN ER 600 MG PO TB12
1200.0000 mg | ORAL_TABLET | Freq: Two times a day (BID) | ORAL | Status: DC
  Administered 2020-05-10 – 2020-05-12 (×2): 1200 mg via ORAL
  Filled 2020-05-10 (×4): qty 2

## 2020-05-10 MED ORDER — TREPROSTINIL 0.6 MG/ML IN SOLN
54.0000 ug | Freq: Four times a day (QID) | RESPIRATORY_TRACT | Status: DC
  Administered 2020-05-10 (×2): 54 ug via RESPIRATORY_TRACT
  Administered 2020-05-10: 0.36 mg via RESPIRATORY_TRACT
  Administered 2020-05-11 – 2020-05-12 (×6): 54 ug via RESPIRATORY_TRACT
  Filled 2020-05-10 (×4): qty 2.9

## 2020-05-10 MED ORDER — FLUTICASONE PROPIONATE 50 MCG/ACT NA SUSP
2.0000 | Freq: Every day | NASAL | Status: DC
  Administered 2020-05-10: 2 via NASAL
  Filled 2020-05-10 (×2): qty 16

## 2020-05-10 NOTE — Progress Notes (Signed)
PROGRESS NOTE    Paula Torres  GYK:[id number] DOB: 29-Dec-1950 DOA: 05/09/2020 PCP: Rusty Aus, MD   Chief Complaint  Patient presents with  . Shortness of Breath    Brief Narrative:  HPI per Dr. Nadyne Coombes  is a 69 y.o. Caucasian female with a known history of diastolic CHF, COPD and hypertension, atrial fibrillation and severe pulmonary hypertension presented to the emergency room with acute onset of worsening dyspnea with associated dry cough and wheezing for the last day, with no fever or chills.  She took 2 nebulized albuterol treatment at home without significant help and received an additional DuoNeb with EMS.  Her O2 saturation was initially normal on 3 L of O2 by nasal cannula.  She denied chest pain or palpitations.  No nausea or vomiting or abdominal pain.  No hemoptysis or other bleeding diathesis.  She has been having worsening bilateral lower extremity edema despite compliance with her diuretic therapy.  Upon presentation to the emergency room, respiratory rate was 24 and otherwise vital signs were within normal.  Labs revealed ABG with pH 7.36, HCO3 of 53.1 and PCO2 94 with PO2 67 and O2 sat of 88.1%.  She was in significant respiratory distress she was placed on BiPAP.  BMP was remarkable for a CO2 of 40 and her BNP was 55.6 with high-sensitivity troponin I 4.  CBC showed mild leukocytosis of 12.2 with hemoglobin of 10.8 and hematocrit 36.5 slightly lower than previous levels in May and neutrophilia.  Portable chest ray showed cardiomegaly with vascular congestion and interstitial prominence but without overt pulmonary edema. EKG showed normal sinus rhythm with rate 66 with borderline right axis deviation.  The patient was given 80 mg of IV Lasix, DuoNeb and 125 mg of IV Solu-Medrol.  She will be admitted to a progressive unit bed for further evaluation and management.  Assessment & Plan:   Active Problems:   COPD exacerbation ([place])   #1  Acute on chronic  hypoxic and hypercarbic respiratory failure secondary to acute COPD exacerbation/+/-acute on chronic diastolic heart failure Patient presenting with worsening shortness of breath, diffuse wheezing, cough and noted to be in acute respiratory distress on presentation.  Venous blood gas done with a pH of 7.36, bicarb of 53, PCO2 of 94, PO2 of 67 with sats of 88.1%.  Patient placed on BiPAP overnight.  Patient this morning frustrated and insistent BiPAP be removed.  Patient however speaking in full sentences however pulmonary examination is tight with some diffuse crackles.  We will give patient a trial of the BiPAP.  Repeat ABG.  Placed on Pulmicort, Protonix, Flonase, Mucinex, IV azithromycin, IV Rocephin.  Change Solu-Medrol to 80 mg IV every 8 hours.  Continue IV Lasix, Lipitor, bisoprolol.  Strict I's and O's.  Daily weights.  Follow.  BiPAP as needed.  2.  Hyperlipidemia Continue statin.  3.  Paroxysmal atrial fibrillation Patient noted to be in sinus rhythm.  Continue amiodarone and bisoprolol for rate control.  Eliquis for anticoagulation.  4.  Depression Lexapro.  5.  Pulmonary hypertension Resume tyvaso   6.  Restless leg syndrome Continue ropinirole   DVT prophylaxis: Eliquis Code Status: Full Family Communication: Updated patient.  No family at bedside. Disposition:   Status is: Inpatient    Dispo: The patient is from: Home              Anticipated d/c is to: To be determined  Anticipated d/c date is: To be determined              Patient currently in acute respiratory distress secondary to COPD exacerbation requiring BiPAP.  Not stable for discharge.       Consultants:   None  Procedures:   Chest x-ray 05/09/2020, 05/10/2020    Antimicrobials:   IV azithromycin 05/10/2020>>>>  IV Rocephin 05/10/2020>>>>   Subjective: Patient holding BiPAP to her face.  Patient asking when the BiPAP can be removed and states she is frustrated she has to have this on.   Feels shortness of breath is improving since admission.  Denies any chest pain.  No abdominal pain.  No nausea or vomiting.  Patient states usually on 3-1/2 L nasal cannula chronically.  Objective: Vitals:   05/10/20 0200 05/10/20 0400 05/10/20 0630 05/10/20 0700  BP: (!) 107/58 104/87 132/68 132/74  Pulse: 65 62 66 65  Resp: _0 Temp:      TempSrc:      SpO2: 95% 96% 93% 92%  Weight:      Height:        Intake/Output Summary (Last 24 hours) at 05/10/2020 0945 Last data filed at 05/10/2020 0511 Gross per 24 hour  Intake 350 ml  Output 600 ml  Net -250 ml   Filed Weights   05/09/20 1829  Weight: 125 kg    Examination:  General exam: Holding BiPAP mask to her face. Respiratory system: Tach.  Poor air movement.  Diffuse crackles.  Speaking in full sentences.  No use of accessory muscles of respiration.  Cardiovascular system: S1 & S2 heard, RRR. No JVD, murmurs, rubs, gallops or clicks. Trace LE edema Gastrointestinal system: Abdomen is nondistended, soft and nontender. No organomegaly or masses felt. Normal bowel sounds heard. Central nervous system: Alert and oriented. No focal neurological deficits. Extremities: Symmetric 5 x 5 power. Skin: No rashes, lesions or ulcers Psychiatry: Judgement and insight appear normal. Mood & affect appropriate.     Data Reviewed: I have personally reviewed following labs and imaging studies  CBC: Recent Labs  Lab 05/09/20 1848  WBC 12.2*  NEUTROABS 9.6*  HGB 10.8*  HCT 36.5  MCV 90.1  PLT 462    Basic Metabolic Panel: Recent Labs  Lab 05/09/20 1848 05/10/20 0254  NA 140 140  K 3.6 3.5  CL 89* 88*  CO2 40* 41*  GLUCOSE 102* 169*  BUN 17 19  CREATININE 1.01* 1.16*  CALCIUM 9.1 9.2    GFR: Estimated Creatinine Clearance: 61.7 mL/min (A) (by C-G formula based on SCr of 1.16 mg/dL (H)).  Liver Function Tests: No results for input(s): AST, ALT, ALKPHOS, BILITOT, PROT, ALBUMIN in the last 168 hours.  CBG: No  results for input(s): GLUCAP in the last 168 hours.   Recent Results (from the past 240 hour(s))  SARS Coronavirus 2 by RT PCR (hospital order, performed in [facility] hospital lab) Nasopharyngeal Nasopharyngeal Swab     Status: None   Collection Time: 05/09/20  7:54 PM   Specimen: Nasopharyngeal Swab  Result Value Ref Range Status   SARS Coronavirus 2 NEGATIVE NEGATIVE Final    Comment: (NOTE) SARS-CoV-2 target nucleic acids are NOT DETECTED.  The SARS-CoV-2 RNA is generally detectable in upper and lower respiratory specimens during the acute phase of infection. The lowest concentration of SARS-CoV-2 viral copies this assay can detect is 250 copies / mL. A negative result does not preclude SARS-CoV-2 infection and should not be used  as the sole basis for treatment or other patient management decisions.  A negative result may occur with improper specimen collection / handling, submission of specimen other than nasopharyngeal swab, presence of viral mutation(s) within the areas targeted by this assay, and inadequate number of viral copies (<250 copies / mL). A negative result must be combined with clinical observations, patient history, and epidemiological information.  Fact Sheet for Patients:   StrictlyIdeas.no  Fact Sheet for Healthcare Providers: BankingDealers.co.za  This test is not yet approved or  cleared by the [place] FDA and has been authorized for detection and/or diagnosis of SARS-CoV-2 by FDA under an Emergency Use Authorization (EUA).  This EUA will remain in effect (meaning this test can be used) for the duration of the COVID-19 declaration under Section 564(b)(1) of the Act, 21 U.S.C. section 360bbb-3(b)(1), unless the authorization is terminated or revoked sooner.  Performed at [facility], [address], [place],  [address]          Radiology Studies: [facility] Chest [place] 1  View  Result Date: 05/10/2020 CLINICAL DATA:  Shortness of breath. EXAM: PORTABLE CHEST 1 VIEW COMPARISON:  May 09, 2020. FINDINGS: Stable cardiomegaly. No pneumothorax or pleural effusion is noted. Mild central pulmonary vascular congestion is noted. Stable appearance of displaced proximal left humeral fracture. IMPRESSION: Mild central pulmonary vascular congestion. Aortic Atherosclerosis (ICD10-I70.0). Electronically Signed   By: Marijo Conception M.D.   On: 05/10/2020 08:44   DG Chest Portable 1 View  Result Date: 05/09/2020 CLINICAL DATA:  Short of breath for 1 day. EXAM: PORTABLE CHEST 1 VIEW COMPARISON:  01/20/2020 FINDINGS: Mild enlargement of the cardiac silhouette. No mediastinal or hilar masses. Bilateral vascular congestion. Mild interstitial prominence most evident in the lung bases. These findings are similar to the prior study. No lung consolidation. No convincing pleural effusion and no pneumothorax. Displaced fracture of the proximal left humerus, incompletely imaged, new since the prior chest radiograph, but visualized on humerus radiographs from 02/17/2020. IMPRESSION: 1. Cardiomegaly with vascular congestion and interstitial prominence, but without overt pulmonary edema. Findings are similar to the prior chest radiograph. No convincing pneumonia. Electronically Signed   By: Lajean Manes M.D.   On: 05/09/2020 18:57        Scheduled Meds: . amiodarone  200 mg Oral Daily  . apixaban  5 mg Oral BID  . atorvastatin  20 mg Oral q1800  . bisoprolol  5 mg Oral Daily  . budesonide (PULMICORT) nebulizer solution  0.5 mg Nebulization BID  . ergocalciferol  50,000 Units Oral Q7 days  . escitalopram  20 mg Oral Daily  . famotidine  20 mg Oral Daily  . ferrous gluconate  324 mg Oral q morning - 10a  . fluticasone  2 spray Each Nare Daily  . furosemide  40 mg Intravenous Q12H  . guaiFENesin  1,200 mg Oral BID  . ipratropium-albuterol  3 mL Nebulization QID  . loratadine  10 mg Oral  Daily  . methylPREDNISolone (SOLU-MEDROL) injection  80 mg Intravenous Q8H  . pantoprazole  40 mg Oral Q0600  . potassium chloride  40 mEq Oral Once  . rOPINIRole  2 mg Oral QHS  . sodium chloride flush  3 mL Intravenous Q12H  . spironolactone  25 mg Oral Daily  . Treprostinil  0.6 mL Inhalation QID   Continuous Infusions: . sodium chloride    . azithromycin Stopped (05/10/20 0511)  . cefTRIAXone (ROCEPHIN)  IV Stopped (05/10/20 0408)  LOS: 1 day    Time spent: 40 minutes    Irine Seal, MD [facility]   To contact the attending provider between 7A-7P or the covering provider during after hours 7P-7A, please log into the web site [email or web address] and access using universal [place] password for that web site. If you do not have the password, please call the hospital operator.  05/10/2020, 9:45 AM

## 2020-05-10 NOTE — ED Notes (Signed)
Resumed care from Mac, RN. Pt resting, on BiPap. NAD noted; replaced purewick. No further needs expressed at this time. Will continue to monitor.

## 2020-05-10 NOTE — ED Notes (Signed)
Pt requests to be taken off bipap. Told her I will investigate trialing her off of it with MD.  RT to come and look at adjusting mask for comfort.

## 2020-05-10 NOTE — ED Notes (Signed)
Pr dr Grandville Silos, ok to trial pt off bipap at 4L [place] to start. Pt had bipap off when this nurse entered the room. [place] at 4L O2 sat 86-92. Pt states 88 is her "normal." Pt assisted with meal tray and she will call when she finishes so we can assess oxygenation.

## 2020-05-10 NOTE — ED Notes (Signed)
Pt insists on walking to restroom. This RN explained risks of removing bipap and walking in room. Pt states understanding. Steady gait to bathroom. Pt then labored breathing and asking for help to get up. This Rn and Multimedia programmer helped pt back into bed w walker.Pt asking for oxygen. This RN again explains importance to pt of keeping bipap mask on.

## 2020-05-10 NOTE — ED Notes (Signed)
Pt's bedsheets changed out, new purewick place; pt repositioned; tolerated well

## 2020-05-10 NOTE — ED Notes (Signed)
Dr Grandville Silos at bedside

## 2020-05-10 NOTE — ED Notes (Signed)
Pt has removed bipap 3 times by self. This RN explained importance of keeping mask on to improve pt breathing as oxygen sats drop as low as 79% without bipap. Pt nods in understanding then asks for sip of water.

## 2020-05-10 NOTE — ED Notes (Signed)
Pt requesting food; told her that she can't eat while on bipap. She states she wants off the bipap. Attending MD not ready to make that decision. Pt has diet ordered, will take her off bipap to eat and monitor breathing/oxygenation during the process.

## 2020-05-11 DIAGNOSIS — J9621 Acute and chronic respiratory failure with hypoxia: Secondary | ICD-10-CM | POA: Diagnosis present

## 2020-05-11 DIAGNOSIS — J9622 Acute and chronic respiratory failure with hypercapnia: Secondary | ICD-10-CM

## 2020-05-11 DIAGNOSIS — I480 Paroxysmal atrial fibrillation: Secondary | ICD-10-CM | POA: Diagnosis present

## 2020-05-11 LAB — CBC WITH DIFFERENTIAL/PLATELET
Abs Immature Granulocytes: 0.06 10*3/uL (ref 0.00–0.07)
Basophils Absolute: 0 10*3/uL (ref 0.0–0.1)
Basophils Relative: 0 %
Eosinophils Absolute: 0 10*3/uL (ref 0.0–0.5)
Eosinophils Relative: 0 %
HCT: 36.4 % (ref 36.0–46.0)
Hemoglobin: 10.7 g/dL — ABNORMAL LOW (ref 12.0–15.0)
Immature Granulocytes: 0 %
Lymphocytes Relative: 3 %
Lymphs Abs: 0.4 10*3/uL — ABNORMAL LOW (ref 0.7–4.0)
MCH: 26.4 pg (ref 26.0–34.0)
MCHC: 29.4 g/dL — ABNORMAL LOW (ref 30.0–36.0)
MCV: 89.7 fL (ref 80.0–100.0)
Monocytes Absolute: 0.1 10*3/uL (ref 0.1–1.0)
Monocytes Relative: 1 %
Neutro Abs: 13.4 10*3/uL — ABNORMAL HIGH (ref 1.7–7.7)
Neutrophils Relative %: 96 %
Platelets: 346 10*3/uL (ref 150–400)
RBC: 4.06 MIL/uL (ref 3.87–5.11)
RDW: 15.9 % — ABNORMAL HIGH (ref 11.5–15.5)
WBC: 14 10*3/uL — ABNORMAL HIGH (ref 4.0–10.5)
nRBC: 0 % (ref 0.0–0.2)

## 2020-05-11 LAB — BASIC METABOLIC PANEL
Anion gap: 11 (ref 5–15)
BUN: 31 mg/dL — ABNORMAL HIGH (ref 8–23)
CO2: 43 mmol/L — ABNORMAL HIGH (ref 22–32)
Calcium: 9.1 mg/dL (ref 8.9–10.3)
Chloride: 87 mmol/L — ABNORMAL LOW (ref 98–111)
Creatinine, Ser: 1.08 mg/dL — ABNORMAL HIGH (ref 0.44–1.00)
GFR calc Af Amer: >60 mL/min (ref >60)
GFR calc non Af Amer: 53 mL/min — ABNORMAL LOW (ref >60)
Glucose, Bld: 155 mg/dL — ABNORMAL HIGH (ref 70–99)
Potassium: 3.9 mmol/L (ref 3.5–5.1)
Sodium: 141 mmol/L (ref 135–145)

## 2020-05-11 LAB — MAGNESIUM: Magnesium: 2.7 mg/dL — ABNORMAL HIGH (ref 1.7–2.4)

## 2020-05-11 MED ORDER — ALBUTEROL SULFATE (2.5 MG/3ML) 0.083% IN NEBU: 3.0000 mL | INHALATION_SOLUTION | RESPIRATORY_TRACT | Status: DC | PRN

## 2020-05-11 MED ORDER — METHYLPREDNISOLONE SODIUM SUCC 125 MG IJ SOLR: 60.0000 mg | Freq: Two times a day (BID) | INTRAMUSCULAR | Status: DC

## 2020-05-11 MED ORDER — ENSURE MAX PROTEIN PO LIQD
11.0000 [oz_av] | Freq: Two times a day (BID) | ORAL | Status: DC
  Filled 2020-05-11: qty 330

## 2020-05-11 NOTE — Progress Notes (Addendum)
PROGRESS NOTE    Paula Torres   FOY:[id number]  DOB: 07/01/1951  PCP: Rusty Aus, MD    DOA: 05/09/2020 LOS: 2   Brief Narrative   HPI per Dr. Sidney Ace "Paula Torres  is a 69 y.o. Caucasian female with a known history of diastolic CHF, COPD and hypertension, atrial fibrillation and severe pulmonary hypertension presented to the emergency room with acute onset of worsening dyspnea with associated dry cough and wheezing for the last day, with no fever or chills.  She took 2 nebulized albuterol treatment at home without significant help and received an additional DuoNeb with EMS.  Her O2 saturation was initially normal on 3 L of O2 by nasal cannula.  She denied chest pain or palpitations.  No nausea or vomiting or abdominal pain.  No hemoptysis or other bleeding diathesis.  She has been having worsening bilateral lower extremity edema despite compliance with her diuretic therapy.   Upon presentation to the emergency room, respiratory rate was 24 and otherwise vital signs were within normal.  Labs revealed ABG with pH 7.36, HCO3 of 53.1 and PCO2 94 with PO2 67 and O2 sat of 88.1%.  She was in significant respiratory distress she was placed on BiPAP.  BMP was remarkable for a CO2 of 40 and her BNP was 55.6 with high-sensitivity troponin I 4.  CBC showed mild leukocytosis of 12.2 with hemoglobin of 10.8 and hematocrit 36.5 slightly lower than previous levels in May and neutrophilia.  Portable chest ray showed cardiomegaly with vascular congestion and interstitial prominence but without overt pulmonary edema. EKG showed normal sinus rhythm with rate 66 with borderline right axis deviation.   The patient was given 80 mg of IV Lasix, DuoNeb and 125 mg of IV Solu-Medrol.  She will be admitted to a progressive unit bed for further evaluation and management."     Assessment & Plan   Principal Problem:   Acute on chronic respiratory failure with hypoxia and hypercapnia (HCC) Active Problems:   AF  (paroxysmal atrial fibrillation) (HCC)   COPD with acute exacerbation (HCC)   Acute on chronic diastolic CHF (congestive heart failure) ([place])   Morbid obesity with BMI of 50.0-59.9, adult (HCC)   Pulmonary hypertension (HCC)   Restless leg syndrome   Depression   Acute on chronic hypoxic and hypercarbic respiratory failure secondary to  Acute on chronic diastolic CHF and  Acute exacerbation of COPD Above conditions present on admission, patient presented with with worsening shortness of breath, diffuse wheezing, cough and noted to be in acute respiratory distress with increased oxygen requirement above her baseline need for 3.0-3.5 L/min oxygen. Chronic respiratory failure is due to pulmonary HTN and COPD. Echo in May EF 60-65%, indeterminate diastolic parameters VBG 7.67/20/94 with 88.1% sat.  Placed on Bipap.   Net IO Since Admission: -1,610 mL [05/11/20 1841] PLAN: --reduce Solu-medrol to 60 mg q12h --continue IV Lasix 40 mg BID --Bipap as needed / tolerated --Scheduled Duonebs QID, PRN albuterol, Pulmicort nebs BID --Continue Rocephin and Zithromax  --Will check procal and stop Rocephin if negative, complete 5 days Zithromax for COPD --Mucinex --daily wt, strict I/O's --continue spironolactone, bisoprolol  Hyperlipidemia - Continue Lipitor  Paroxysmal atrial fibrillation - currently in sinus and rate controlled.  Continue amiodarone, bisoprolol, Eliquis. Follows with Dr. Fletcher Anon, [facility].  Depression - continue Lexapro  Pulmonary hypertension - continue Tyvaso.  Follows with Dr. Lanney Gins.   Restless leg syndrome - Continue ropinirole  GERD - continue Pepcid  Morbid obesity: Body  mass index is 43.4 kg/m.   Significantly complicates overall care and prognosis, including respiratory status due to thoracic restriction with body habitus.  Counseled on lifestyle modifications for weight loss.  PCP follow up.   DVT prophylaxis:  apixaban (ELIQUIS) tablet 5 mg    Diet:  Diet Orders (From admission, onward)    Start     Ordered   05/11/20 1331  Diet 2 gram sodium Room service appropriate? Yes; Fluid consistency: Thin  Diet effective now       Question Answer Comment  Room service appropriate? Yes   Fluid consistency: Thin      05/11/20 1330            Code Status: Full Code    Subjective 05/11/20    Patient seen with daughter at bedside today.  Reports feeling a lot better than when she first came in.  Says she worked with PT on 4 L/min oxygen earlier.  Confirms she uses 3-3.5 L/min at home.  Has been doing HHPT due to a humerus fracture.  Denies fever/chills.  Cough improving.  No chest pain or SOB at rest.  Disposition Plan & Communication   Status is: Inpatient  Remains inpatient appropriate because:IV treatments appropriate due to intensity of illness or inability to take PO.  Remains on IV steroids and IV diuretic.   Dispo: The patient is from: Home              Anticipated d/c is to: Home              Anticipated d/c date is: 2 days              Patient currently is not medically stable to d/c.     Family Communication: daughter at bedside during encounter today, 9/8.   Consults, Procedures, Significant Events   Consultants:   None   Antimicrobials:   Rocephin, Zithromax     Objective   Vitals:   05/11/20 1159 05/11/20 1225 05/11/20 1604 05/11/20 1620  BP: (!) 109/54   (!) 114/51  Pulse: 65   68  Resp: 18   18  Temp: 98.4 F (36.9 C)   98.1 F (36.7 C)  TempSrc: Oral   Oral  SpO2: 95% 93% 91% (!) 88%  Weight:      Height:        Intake/Output Summary (Last 24 hours) at 05/11/2020 1841 Last data filed at 05/11/2020 1413 Gross per 24 hour  Intake 240 ml  Output 700 ml  Net -460 ml   Filed Weights   05/09/20 1829 05/10/20 2032  Weight: 125 kg 118.3 kg    Physical Exam:  General exam: awake, alert, no acute distress, morbidly obese Respiratory system: overall clear bilaterally with diminished  bases, no wheezes or rhonchi, normal respiratory effort. Cardiovascular system: normal S1/S2, RRR, no pedal edema.   Gastrointestinal system: soft, NT, ND, no HSM felt, +bowel sounds. Central nervous system: A&O x3. no gross focal neurologic deficits, normal speech Extremities: moves all, no cyanosis, normal tone Skin: dry, intact, normal temperature, normal color Psychiatry: normal mood, congruent affect  Labs   Data Reviewed: I have personally reviewed following labs and imaging studies  CBC: Recent Labs  Lab 05/09/20 1848 05/11/20 0740  WBC 12.2* 14.0*  NEUTROABS 9.6* 13.4*  HGB 10.8* 10.7*  HCT 36.5 36.4  MCV 90.1 89.7  PLT 320 161   Basic Metabolic Panel: Recent Labs  Lab 05/09/20 1848 05/10/20 0254 05/11/20 0740  NA  140 140 141  K 3.6 3.5 3.9  CL 89* 88* 87*  CO2 40* 41* 43*  GLUCOSE 102* 169* 155*  BUN 17 19 31*  CREATININE 1.01* 1.16* 1.08*  CALCIUM 9.1 9.2 9.1  MG  --   --  2.7*   GFR: Estimated Creatinine Clearance: 64.1 mL/min (A) (by C-G formula based on SCr of 1.08 mg/dL (H)). Liver Function Tests: No results for input(s): AST, ALT, ALKPHOS, BILITOT, PROT, ALBUMIN in the last 168 hours. No results for input(s): LIPASE, AMYLASE in the last 168 hours. No results for input(s): AMMONIA in the last 168 hours. Coagulation Profile: No results for input(s): INR, PROTIME in the last 168 hours. Cardiac Enzymes: No results for input(s): CKTOTAL, CKMB, CKMBINDEX, TROPONINI in the last 168 hours. BNP (last 3 results) No results for input(s): PROBNP in the last 8760 hours. HbA1C: No results for input(s): HGBA1C in the last 72 hours. CBG: No results for input(s): GLUCAP in the last 168 hours. Lipid Profile: No results for input(s): CHOL, HDL, LDLCALC, TRIG, CHOLHDL, LDLDIRECT in the last 72 hours. Thyroid Function Tests: No results for input(s): TSH, T4TOTAL, FREET4, T3FREE, THYROIDAB in the last 72 hours. Anemia Panel: No results for input(s): VITAMINB12,  FOLATE, FERRITIN, TIBC, IRON, RETICCTPCT in the last 72 hours. Sepsis Labs: No results for input(s): PROCALCITON, LATICACIDVEN in the last 168 hours.  Recent Results (from the past 240 hour(s))  SARS Coronavirus 2 by RT PCR (hospital order, performed in [facility] hospital lab) Nasopharyngeal Nasopharyngeal Swab     Status: None   Collection Time: 05/09/20  7:54 PM   Specimen: Nasopharyngeal Swab  Result Value Ref Range Status   SARS Coronavirus 2 NEGATIVE NEGATIVE Final    Comment: (NOTE) SARS-CoV-2 target nucleic acids are NOT DETECTED.  The SARS-CoV-2 RNA is generally detectable in upper and lower respiratory specimens during the acute phase of infection. The lowest concentration of SARS-CoV-2 viral copies this assay can detect is 250 copies / mL. A negative result does not preclude SARS-CoV-2 infection and should not be used as the sole basis for treatment or other patient management decisions.  A negative result may occur with improper specimen collection / handling, submission of specimen other than nasopharyngeal swab, presence of viral mutation(s) within the areas targeted by this assay, and inadequate number of viral copies (<250 copies / mL). A negative result must be combined with clinical observations, patient history, and epidemiological information.  Fact Sheet for Patients:   StrictlyIdeas.no  Fact Sheet for Healthcare Providers: BankingDealers.co.za  This test is not yet approved or  cleared by the [place] FDA and has been authorized for detection and/or diagnosis of SARS-CoV-2 by FDA under an Emergency Use Authorization (EUA).  This EUA will remain in effect (meaning this test can be used) for the duration of the COVID-19 declaration under Section 564(b)(1) of the Act, 21 U.S.C. section 360bbb-3(b)(1), unless the authorization is terminated or revoked sooner.  Performed at [facility], [address], [place], [place] [address]       Imaging Studies   DG Chest [place] 1 View  Result Date: 05/10/2020 CLINICAL DATA:  Shortness of breath. EXAM: PORTABLE CHEST 1 VIEW COMPARISON:  May 09, 2020. FINDINGS: Stable cardiomegaly. No pneumothorax or pleural effusion is noted. Mild central pulmonary vascular congestion is noted. Stable appearance of displaced proximal left humeral fracture. IMPRESSION: Mild central pulmonary vascular congestion. Aortic Atherosclerosis (ICD10-I70.0). Electronically Signed   By: Marijo Conception M.D.   On:  05/10/2020 08:44   DG Chest Portable 1 View  Result Date: 05/09/2020 CLINICAL DATA:  Short of breath for 1 day. EXAM: PORTABLE CHEST 1 VIEW COMPARISON:  01/20/2020 FINDINGS: Mild enlargement of the cardiac silhouette. No mediastinal or hilar masses. Bilateral vascular congestion. Mild interstitial prominence most evident in the lung bases. These findings are similar to the prior study. No lung consolidation. No convincing pleural effusion and no pneumothorax. Displaced fracture of the proximal left humerus, incompletely imaged, new since the prior chest radiograph, but visualized on humerus radiographs from 02/17/2020. IMPRESSION: 1. Cardiomegaly with vascular congestion and interstitial prominence, but without overt pulmonary edema. Findings are similar to the prior chest radiograph. No convincing pneumonia. Electronically Signed   By: Lajean Manes M.D.   On: 05/09/2020 18:57     Medications   Scheduled Meds: . amiodarone  200 mg Oral Daily  . apixaban  5 mg Oral BID  . atorvastatin  20 mg Oral q1800  . bisoprolol  5 mg Oral Daily  . budesonide (PULMICORT) nebulizer solution  0.5 mg Nebulization BID  . escitalopram  20 mg Oral Daily  . famotidine  20 mg Oral Daily  . ferrous gluconate  324 mg Oral q morning - 10a  . fluticasone  2 spray Each Nare Daily  . furosemide  40 mg Intravenous Q12H  . guaiFENesin  1,200 mg Oral BID  . ipratropium-albuterol  3 mL  Nebulization QID  . loratadine  10 mg Oral Daily  . [START ON 05/12/2020] methylPREDNISolone (SOLU-MEDROL) injection  60 mg Intravenous Q12H  . Ensure Max Protein  11 oz Oral BID  . sodium chloride flush  3 mL Intravenous Q12H  . spironolactone  25 mg Oral Daily  . Treprostinil  54 mcg Inhalation QID  . [START ON 05/13/2020] Vitamin D (Ergocalciferol)  50,000 Units Oral Q7 days   Continuous Infusions: . sodium chloride    . azithromycin 500 mg (05/11/20 0152)  . cefTRIAXone (ROCEPHIN)  IV 1 g (05/11/20 0051)       LOS: 2 days    Time spent: 30 minutes    Ezekiel Slocumb, DO [facility]  05/11/2020, 6:41 PM    If 7PM-7AM, please contact night-coverage. How to contact the [facility] Attending or Consulting provider [place] or covering provider during after hours [place], for this patient?    1. Check the care team in [facility] and look for a) attending/consulting TRH provider listed and b) the [facility] team listed 2. Log into [email or web address] and use [place]'s universal password to access. If you do not have the password, please contact the hospital operator. 3. Locate the [facility] provider you are looking for under [facility] and page to a number that you can be directly reached. 4. If you still have difficulty reaching the provider, please page the [facility] (Director on Call) for the Hospitalists listed on amion for assistance.

## 2020-05-11 NOTE — Hospital Course (Signed)
HPI per Dr. Sidney Ace "Paula Torres  is a 69 y.o. Caucasian female with a known history of diastolic CHF, COPD and hypertension, atrial fibrillation and severe pulmonary hypertension presented to the emergency room with acute onset of worsening dyspnea with associated dry cough and wheezing for the last day, with no fever or chills.  She took 2 nebulized albuterol treatment at home without significant help and received an additional DuoNeb with EMS.  Her O2 saturation was initially normal on 3 L of O2 by nasal cannula.  She denied chest pain or palpitations.  No nausea or vomiting or abdominal pain.  No hemoptysis or other bleeding diathesis.  She has been having worsening bilateral lower extremity edema despite compliance with her diuretic therapy.   Upon presentation to the emergency room, respiratory rate was 24 and otherwise vital signs were within normal.  Labs revealed ABG with pH 7.36, HCO3 of 53.1 and PCO2 94 with PO2 67 and O2 sat of 88.1%.  She was in significant respiratory distress she was placed on BiPAP.  BMP was remarkable for a CO2 of 40 and her BNP was 55.6 with high-sensitivity troponin I 4.  CBC showed mild leukocytosis of 12.2 with hemoglobin of 10.8 and hematocrit 36.5 slightly lower than previous levels in May and neutrophilia.  Portable chest ray showed cardiomegaly with vascular congestion and interstitial prominence but without overt pulmonary edema. EKG showed normal sinus rhythm with rate 66 with borderline right axis deviation.   The patient was given 80 mg of IV Lasix, DuoNeb and 125 mg of IV Solu-Medrol.  She will be admitted to a progressive unit bed for further evaluation and management."

## 2020-05-11 NOTE — TOC Initial Note (Signed)
Transition of Care [facility]) - Initial/Assessment Note    Patient Details  Name: Paula Torres MRN: [id number] Date of Birth: July 27, 1951  [facility] CM/SW Contact:    Victorino Dike, RN Phone Number: 05/11/2020, 9:20 AM  Clinical Narrative:                  Patient reports living at home with husband and daughter.  She reports her daughter provides transportation when needed.  Currently has rollater, rw, oxygen, shower chair, high toilet and grab bars in bathroom.  Home has 4 steps to enter.   Patient is receiving [facility] for shoulder at this time, but is unsure of the company that is providing [place].    Expected Discharge Plan: [place] Barriers to Discharge: Continued Medical Work up   Patient Goals and CMS Choice Patient states their goals for this hospitalization and ongoing recovery are:: to return home with daughter and husband CMS Medicare.gov Compare Post Acute Care list provided to:: Patient Choice offered to / list presented to : Patient  Expected Discharge Plan and Services Expected Discharge Plan: [place]   Discharge Planning Services: CM Consult   Living arrangements for the past 2 months: Single Family Home                                      Prior Living Arrangements/Services Living arrangements for the past 2 months: Single Family Home Lives with:: Self, Spouse, Adult Children          Need for Family Participation in Patient Care: Yes (Comment) Care giver support system in place?: Yes (comment) Current home services: DME, Home PT (oxygen, rollater, rw and shower chair, high toilet and grab bars in bathroom) Criminal Activity/Legal Involvement Pertinent to Current Situation/Hospitalization: No - Comment as needed  Activities of Daily Living Home Assistive Devices/Equipment: Blood pressure cuff, Oxygen, Scales, Walker (specify type), Bedside commode/3-in-1, Dentures (specify type), Eyeglasses ADL  Screening (condition at time of admission) Patient's cognitive ability adequate to safely complete daily activities?: Yes Is the patient deaf or have difficulty hearing?: No Does the patient have difficulty seeing, even when wearing glasses/contacts?: No Does the patient have difficulty concentrating, remembering, or making decisions?: No Patient able to express need for assistance with ADLs?: Yes Does the patient have difficulty dressing or bathing?: No Independently performs ADLs?: Yes (appropriate for developmental age) Does the patient have difficulty walking or climbing stairs?: Yes Weakness of Legs: None Weakness of Arms/Hands: None  Permission Sought/Granted                  Emotional Assessment       Orientation: : Oriented to Self, Oriented to Place, Oriented to  Time, Oriented to Situation Alcohol / Substance Use: Never Used Psych Involvement: No (comment)  Admission diagnosis:  Acute respiratory distress [R06.03] COPD exacerbation (HCC) [J44.1] Acute on chronic diastolic congestive heart failure (HCC) [I50.33] Patient Active Problem List   Diagnosis Date Noted   Acute on chronic diastolic congestive heart failure (HCC)    Acute respiratory distress    Pulmonary hypertension ([place])    Restless leg syndrome    Depression    COPD exacerbation ([place]) 05/09/2020   Atrial fibrillation with RVR ([place]) 01/21/2020   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[place]) 01/21/2020   Abnormal cardiovascular stress test    Demand ischemia ([place])  Hyperlipidemia    COPD with acute exacerbation ([place]) 11/08/2019   Acute on chronic diastolic CHF (congestive heart failure) ([place]) 11/08/2019   Acute respiratory failure with hypoxia and hypercapnia ([place]) 11/08/2019   Morbid obesity with BMI of 50.0-59.9, adult ([place]) 11/08/2019   Elevated troponin 11/08/2019   PCP:  Rusty Aus, MD Pharmacy:   [id number] [address], [place]  [address] [place] [address] Phone: [phone] Fax: [phone]     Social Determinants of Health (SDOH) Interventions    Readmission Risk Interventions No flowsheet data found.

## 2020-05-11 NOTE — Progress Notes (Signed)
Initial Nutrition Assessment  DOCUMENTATION CODES:   Morbid obesity  INTERVENTION:   Ensure Max protein supplement BID, each supplement provides 150kcal and 30g of protein.  Liberalize diet   NUTRITION DIAGNOSIS:   Increased nutrient needs related to catabolic illness (COPD) as evidenced by increased estimated needs.  GOAL:   Patient will meet greater than or equal to 90% of their needs  MONITOR:   PO intake, Supplement acceptance, Weight trends, Labs, Skin, I & O's  REASON FOR ASSESSMENT:   Malnutrition Screening Tool    ASSESSMENT:   69 y.o. Caucasian female with a known history of diastolic CHF, COPD and hypertension, atrial fibrillation and severe pulmonary hypertension presented to the emergency room with acute onset of worsening dyspnea with associated dry cough and wheezing.   Pt reports good appetite and oral intake pta and in hospital; pt reports eating 100% of meals. Per chart, pt appears fairly weight stable at baseline. RD discussed with pt the importance of adequate protein intake needed to preserve lean muscle. Pt is willing to drink chocolate supplements while in hospital. RD will add supplements and liberalize the heart healthy portion of pt's diet as this is restrictive of protein.   Medications reviewed and include: pepcid, ferrous gluconate, lasix, solu-medrol, aldactone, vitamin D  Labs reviewed: BUN 31(H), creat 1.08(H), Mg 2.7(H) Wbc- 14.0(H), Hgb 10.7(L)  Nutrition-Focused physical exam completed. Findings are no fat depletion, no muscle depletion and no edema.   Diet Order:   Diet Order            Diet 2 gram sodium Room service appropriate? Yes; Fluid consistency: Thin  Diet effective now                EDUCATION NEEDS:   Education needs have been addressed  Skin:  Skin Assessment: Reviewed RN Assessment  Last BM:  pta  Height:   Ht Readings from Last 1 Encounters:  05/10/20 _0  (1.651 m)    Weight:   Wt Readings from Last  1 Encounters:  05/10/20 118.3 kg    Ideal Body Weight:  56.8 kg  BMI:  Body mass index is 43.4 kg/m.  Estimated Nutritional Needs:   Kcal:  2100-2400kcal/day  Protein:  105-120g/day  Fluid:  1.7L/day  Koleen Distance MS, RD, LDN Please refer to [facility] for RD and/or RD on-call/weekend/after hours pager

## 2020-05-11 NOTE — Progress Notes (Addendum)
  Heart Failure Nurse Navigator Note  HFpEF( EF 60-65%)  Initial Visit with patient and daughter who lives with her.  Patient admitted with complaints of worsening dyspnea, cough and lower extremity edema.  Comorbidities:  Depression COPD Hyperlipidemia Paroxsymal A fib Pulmonary Hypertension OSA  Labs: Sodium 141, potassium 3.9, BUN 31 and creatinine 1.08 (1.16 yesterday)  Medications:  Bisoprolol 77m daily Amiodarone 200 mg daily Eliquis 5 mg BID Lipitor 20 mg daily Lasix 40 IV Q12 hours  Discussed with patient and daughter  the importance of weighing herself daily and not every 2 to 3 days as she was doing at home.  Instructed to weigh after going to the bathroom, with same amount of clothes or in the nude.  Instructed to call doctor for 2 pound weight gain over night or 5 pounds within the week.  Also discussed diet and low sodium. She states they eat out a lot, she also had a biscuit from [place] her over the bed table.  She does not have her CPAP machine- still waiting on insurance to okay it.  She states she has not viewed the heart failure videos in the past.  Told her we would work on that.   Will continue to follow her throughout this hospitalization.      JPricilla RiffleRN, CHFN

## 2020-05-12 LAB — CBC WITH DIFFERENTIAL/PLATELET
Abs Immature Granulocytes: 0.07 10*3/uL (ref 0.00–0.07)
Basophils Absolute: 0 10*3/uL (ref 0.0–0.1)
Basophils Relative: 0 %
Eosinophils Absolute: 0 10*3/uL (ref 0.0–0.5)
Eosinophils Relative: 0 %
HCT: 36.7 % (ref 36.0–46.0)
Hemoglobin: 11 g/dL — ABNORMAL LOW (ref 12.0–15.0)
Immature Granulocytes: 1 %
Lymphocytes Relative: 3 %
Lymphs Abs: 0.5 10*3/uL — ABNORMAL LOW (ref 0.7–4.0)
MCH: 26.8 pg (ref 26.0–34.0)
MCHC: 30 g/dL (ref 30.0–36.0)
MCV: 89.3 fL (ref 80.0–100.0)
Monocytes Absolute: 0.5 10*3/uL (ref 0.1–1.0)
Monocytes Relative: 3 %
Neutro Abs: 13.1 10*3/uL — ABNORMAL HIGH (ref 1.7–7.7)
Neutrophils Relative %: 93 %
Platelets: 344 10*3/uL (ref 150–400)
RBC: 4.11 MIL/uL (ref 3.87–5.11)
RDW: 15.9 % — ABNORMAL HIGH (ref 11.5–15.5)
WBC: 14.1 10*3/uL — ABNORMAL HIGH (ref 4.0–10.5)
nRBC: 0 % (ref 0.0–0.2)

## 2020-05-12 LAB — BASIC METABOLIC PANEL
Anion gap: 12 (ref 5–15)
BUN: 35 mg/dL — ABNORMAL HIGH (ref 8–23)
CO2: 42 mmol/L — ABNORMAL HIGH (ref 22–32)
Calcium: 9.2 mg/dL (ref 8.9–10.3)
Chloride: 86 mmol/L — ABNORMAL LOW (ref 98–111)
Creatinine, Ser: 1.08 mg/dL — ABNORMAL HIGH (ref 0.44–1.00)
GFR calc Af Amer: >60 mL/min (ref >60)
GFR calc non Af Amer: 53 mL/min — ABNORMAL LOW (ref >60)
Glucose, Bld: 134 mg/dL — ABNORMAL HIGH (ref 70–99)
Potassium: 3.8 mmol/L (ref 3.5–5.1)
Sodium: 140 mmol/L (ref 135–145)

## 2020-05-12 LAB — PROCALCITONIN: Procalcitonin: <0.1 ng/mL

## 2020-05-12 MED ORDER — LORATADINE 10 MG PO TABS: 10.0000 mg | ORAL_TABLET | Freq: Every day | ORAL | 2 refills | Status: AC

## 2020-05-12 MED ORDER — PREDNISONE 20 MG PO TABS: ORAL_TABLET | ORAL | 0 refills | Status: AC

## 2020-05-12 MED ORDER — AZITHROMYCIN 500 MG PO TABS: ORAL_TABLET | ORAL | 0 refills | Status: DC

## 2020-05-12 MED ORDER — AMIODARONE HCL 200 MG PO TABS: 200.0000 mg | ORAL_TABLET | Freq: Every day | ORAL | Status: DC

## 2020-05-12 MED ORDER — AZITHROMYCIN 250 MG PO TABS: 500.0000 mg | ORAL_TABLET | Freq: Every day | ORAL | Status: DC

## 2020-05-12 MED ORDER — GUAIFENESIN ER 600 MG PO TB12: 1200.0000 mg | ORAL_TABLET | Freq: Two times a day (BID) | ORAL | 0 refills | Status: AC

## 2020-05-12 MED ORDER — POTASSIUM CHLORIDE CRYS ER 20 MEQ PO TBCR
40.0000 meq | EXTENDED_RELEASE_TABLET | Freq: Once | ORAL | Status: AC
  Administered 2020-05-12: 40 meq via ORAL
  Filled 2020-05-12: qty 2

## 2020-05-12 MED ORDER — FLUTICASONE PROPIONATE 50 MCG/ACT NA SUSP: 2.0000 | Freq: Every day | NASAL | 2 refills | Status: DC

## 2020-05-12 MED ORDER — PREDNISONE 50 MG PO TABS: 60.0000 mg | ORAL_TABLET | Freq: Every day | ORAL | Status: DC

## 2020-05-12 NOTE — Progress Notes (Signed)
  Heart Failure Nurse Navigator Note  HFpEF-60-65%   She had presented on admission with worsening dyspnea, cough and wheezing.  Visit today with patient and her daughter.  Comorbidities:  Depression COPD Hyperlipidemia Hypertension Paroxsymal A fib Pulmonary Hypertension OSA    Labs:  Sodium 140, potassium 3.8, BUN 35, creatinine 1.08   Medications:  Bisoprolol 5 mg daily Amiodarone 200 mg daily Eliquis 5 mg BID Lipitor 20 mg daily Lasix 40 mg IV q 12 hours Spironolactone 25 mg daily   Discussed again today limiting fluid intake to 2000 ml in a 24 hour period, also limiting sodium also to 2000 mg per 24 hours. Also went over magnet-green section you are doing well, in the yellow section need to be contacting your doctor and the red need to be seen in the ED.  Also discussed importance of daily weights and recording.she voices understanding.  Will continue to follow through this hospitalization.  Pricilla Riffle RN,CHFN

## 2020-05-12 NOTE — Discharge Summary (Signed)
Physician Discharge Summary  White WNI:[id number] DOB: Feb 08, 1951 DOA: 05/09/2020  PCP: Rusty Aus, MD  Admit date: 05/09/2020 Discharge date: 05/12/2020  Admitted From: home Disposition:  home  Recommendations for Outpatient Follow-up:  1. Follow up with PCP in 1-2 weeks 2. Please obtain BMP/CBC in one week 3. Please follow up with pulmonology in 1-2 weeks  Home Health: Yes - resume PT, OT, RN  Equipment/Devices: none   Discharge Condition: Stable  CODE STATUS: Full  Diet recommendation: Heart Healthy (low sodium), fluid restriction 2 L/day  Discharge Diagnoses: Principal Problem:   Acute on chronic respiratory failure with hypoxia and hypercapnia (HCC) Active Problems:   AF (paroxysmal atrial fibrillation) (HCC)   COPD with acute exacerbation (HCC)   Acute on chronic diastolic CHF (congestive heart failure) ([place])   Morbid obesity with BMI of 50.0-59.9, adult ([place])   Pulmonary hypertension (HCC)   Restless leg syndrome   Depression    Summary of HPI and Hospital Course:  HPI per Dr. Sidney Ace "Paula Torres  is a 69 y.o. Caucasian female with a known history of diastolic CHF, COPD and hypertension, atrial fibrillation and severe pulmonary hypertension presented to the emergency room with acute onset of worsening dyspnea with associated dry cough and wheezing for the last day, with no fever or chills.  She took 2 nebulized albuterol treatment at home without significant help and received an additional DuoNeb with EMS.  Her O2 saturation was initially normal on 3 L of O2 by nasal cannula.  She denied chest pain or palpitations.  No nausea or vomiting or abdominal pain.  No hemoptysis or other bleeding diathesis.  She has been having worsening bilateral lower extremity edema despite compliance with her diuretic therapy.   Upon presentation to the emergency room, respiratory rate was 24 and otherwise vital signs were within normal.  Labs revealed ABG with pH 7.36, HCO3 of  53.1 and PCO2 94 with PO2 67 and O2 sat of 88.1%.  She was in significant respiratory distress she was placed on BiPAP.  BMP was remarkable for a CO2 of 40 and her BNP was 55.6 with high-sensitivity troponin I 4.  CBC showed mild leukocytosis of 12.2 with hemoglobin of 10.8 and hematocrit 36.5 slightly lower than previous levels in May and neutrophilia.  Portable chest ray showed cardiomegaly with vascular congestion and interstitial prominence but without overt pulmonary edema. EKG showed normal sinus rhythm with rate 66 with borderline right axis deviation.   The patient was given 80 mg of IV Lasix, DuoNeb and 125 mg of IV Solu-Medrol.  She will be admitted to a progressive unit bed for further evaluation and management."    Acute on chronic hypoxic and hypercarbic respiratory failuresecondary to  Acute on chronic diastolic CHF and  Acute exacerbation of COPD Above conditions present on admission, patient presented with with worsening shortness of breath, diffuse wheezing, cough and noted to be in acute respiratory distress with increased oxygen requirement above her baseline need for 3.0-3.5 L/min oxygen. Chronic respiratory failure is due to pulmonary HTN and COPD. Echo in May EF 60-65%, indeterminate diastolic parameters VBG 3.81/82/99 with 88.1% sat.  Placed on Bipap.   Treated with IV steroids and IV diuretics, 5 day Zithromax, briefly empiric Rocephin, bronchodilators, supportive care.   Tolerated PRN BiPAP. Clinically improved to baseline and stable for discharge home. Brief prednisone taper. Patient to follow up with pulmonology, cardiology and HF clinic.  Hyperlipidemia - on Lipitor  Paroxysmal atrial fibrillation - in sinus and  rate controlled. Continue amiodarone, bisoprolol, Eliquis. Follows with Dr. Fletcher Anon, [facility].  Depression - onLexapro  Pulmonary hypertension - on Tyvaso.  Follows with Dr. Lanney Gins.   Restless leg syndrome - Continueropinirole  GERD -  continue Pepcid  Morbid obesity: Body mass index is 43.4 kg/m.   Significantly complicates overall care and prognosis, including respiratory status due to thoracic restriction with body habitus.  Counseled on lifestyle modifications for weight loss.  PCP follow up.    Discharge Instructions   Discharge Instructions    (HEART FAILURE PATIENTS) Call MD:  Anytime you have any of the following symptoms: 1) 3 pound weight gain in 24 hours or 5 pounds in 1 week 2) shortness of breath, with or without a dry hacking cough 3) swelling in the hands, feet or stomach 4) if you have to sleep on extra pillows at night in order to breathe.   Complete by: As directed    Call MD for:   Complete by: As directed    Needing to turn oxygen flow rate up, above your normal use   Call MD for:  extreme fatigue   Complete by: As directed    Call MD for:  persistant dizziness or light-headedness   Complete by: As directed    Call MD for:  temperature >100.4   Complete by: As directed    Diet - low sodium heart healthy   Complete by: As directed    Discharge instructions   Complete by: As directed    Please take 2 more days of azithromycin (to complete 5 days). I sent Prednisone taper - 60 mg for 2 days, then 40 mg for 2 days, then 20 mg for 2 days.  Please make an appointment to follow up with Dr. Lanney Gins, pulmonology.  I did not increase your dose of Lasix, but it's possible this should be adjusted.  Please be diligent about low sodium diet, and not taking in more than 2L of fluid each day.  Please follow instructions below regarding daily weights and call your doctor if you are gaining weight (fluid) or having more swelling or worsening shortness of breath especially with exertion.   Increase activity slowly   Complete by: As directed      Allergies as of 05/12/2020      Reactions   Codeine    Back Headache   Sulfa Antibiotics    Reported to pt from mother who is no longer living -  Details unknown       Medication List    TAKE these medications   albuterol 108 (90 Base) MCG/ACT inhaler Commonly known as: VENTOLIN HFA Inhale 2 puffs into the lungs every 6 (six) hours as needed for wheezing or shortness of breath.   amiodarone 200 MG tablet Commonly known as: PACERONE Take 1 tablet (200 mg total) by mouth daily. Start taking on: May 13, 2020 What changed:   medication strength  See the new instructions.   apixaban 5 MG Tabs tablet Commonly known as: ELIQUIS Take 1 tablet (5 mg total) by mouth 2 (two) times daily.   atorvastatin 20 MG tablet Commonly known as: LIPITOR Take 1 tablet (20 mg total) by mouth daily at 6 PM.   azithromycin 500 MG tablet Commonly known as: ZITHROMAX Take 500 mg (one tablet) by mouth daily for two days.   bisoprolol 5 MG tablet Commonly known as: ZEBETA Take 5 mg by mouth daily.   ergocalciferol 1.25 MG (50000 UT) capsule Commonly known as: VITAMIN  D2 Take 50,000 Units by mouth every 7 (seven) days.   escitalopram 20 MG tablet Commonly known as: LEXAPRO Take 20 mg by mouth daily.   famotidine 20 MG tablet Commonly known as: PEPCID Take 20 mg by mouth daily.   ferrous gluconate 324 MG tablet Commonly known as: FERGON Take 324 mg by mouth every morning.   fluticasone 50 MCG/ACT nasal spray Commonly known as: FLONASE Place 2 sprays into both nostrils daily. Start taking on: May 13, 2020   furosemide 40 MG tablet Commonly known as: LASIX Take 1 tablet (40 mg total) by mouth daily.   guaiFENesin 600 MG 12 hr tablet Commonly known as: MUCINEX Take 2 tablets (1,200 mg total) by mouth 2 (two) times daily for 7 days.   HYDROcodone-acetaminophen 5-325 MG tablet Commonly known as: NORCO/VICODIN Take 1 tablet by mouth every 8 (eight) hours as needed.   ipratropium 0.06 % nasal spray Commonly known as: ATROVENT Place 2 sprays into both nostrils 3 (three) times daily as needed.   ipratropium-albuterol 0.5-2.5 (3)  MG/3ML Soln Commonly known as: DUONEB Take 3 mLs by nebulization every 6 (six) hours as needed (for wheezing/shortness of breath.).   Combivent Respimat 20-100 MCG/ACT Aers respimat Generic drug: Ipratropium-Albuterol Inhale 1 puff into the lungs every 6 (six) hours as needed (wheezing/shortness of breath.).   loratadine 10 MG tablet Commonly known as: CLARITIN Take 1 tablet (10 mg total) by mouth daily. Start taking on: May 13, 2020   montelukast 10 MG tablet Commonly known as: SINGULAIR Take 10 mg by mouth at bedtime.   predniSONE 20 MG tablet Commonly known as: DELTASONE Take 3 tablets (60 mg total) by mouth daily for 2 days, THEN 2 tablets (40 mg total) daily for 2 days, THEN 1 tablet (20 mg total) daily for 2 days. Start taking on: May 12, 2020   spironolactone 25 MG tablet Commonly known as: ALDACTONE Take 1 tablet (25 mg total) by mouth daily.   Trelegy Ellipta 100-62.5-25 MCG/INH Aepb Generic drug: Fluticasone-Umeclidin-Vilant Inhale 1 puff into the lungs daily.   Treprostinil 0.6 MG/ML Soln Commonly known as: TYVASO Inhale 0.6 mLs into the lungs 4 (four) times daily.       Follow-up Information    [facility] Follow up on 05/25/2020.   Specialty: Cardiology Why: at 8:30am. Enter through the [facility] entrance Contact information: [place]       Rusty Aus, MD. Schedule an appointment as soon as possible for a visit in 1 week(s).   Specialty: Internal Medicine Contact information: [place] [address] [phone]        Wellington Hampshire, MD .   Specialty: Cardiology Contact information: [place] [address] [phone]        Ottie Glazier, MD. Schedule an appointment as soon as possible for a visit in 1 week(s).   Specialty:  Pulmonary Disease Why: 1-2 weeks Contact information: [place] [address] [phone]              Allergies  Allergen Reactions  . Codeine     Back Headache  . Sulfa Antibiotics     Reported to pt from mother who is no longer living -  Details unknown    Consultations:   None   Procedures/Studies: DG Chest Port 1 View  Result Date: 05/10/2020  CLINICAL DATA:  Shortness of breath. EXAM: PORTABLE CHEST 1 VIEW COMPARISON:  May 09, 2020. FINDINGS: Stable cardiomegaly. No pneumothorax or pleural effusion is noted. Mild central pulmonary vascular congestion is noted. Stable appearance of displaced proximal left humeral fracture. IMPRESSION: Mild central pulmonary vascular congestion. Aortic Atherosclerosis (ICD10-I70.0). Electronically Signed   By: Marijo Conception M.D.   On: 05/10/2020 08:44   DG Chest Portable 1 View  Result Date: 05/09/2020 CLINICAL DATA:  Short of breath for 1 day. EXAM: PORTABLE CHEST 1 VIEW COMPARISON:  01/20/2020 FINDINGS: Mild enlargement of the cardiac silhouette. No mediastinal or hilar masses. Bilateral vascular congestion. Mild interstitial prominence most evident in the lung bases. These findings are similar to the prior study. No lung consolidation. No convincing pleural effusion and no pneumothorax. Displaced fracture of the proximal left humerus, incompletely imaged, new since the prior chest radiograph, but visualized on humerus radiographs from 02/17/2020. IMPRESSION: 1. Cardiomegaly with vascular congestion and interstitial prominence, but without overt pulmonary edema. Findings are similar to the prior chest radiograph. No convincing pneumonia. Electronically Signed   By: Lajean Manes M.D.   On: 05/09/2020 18:57        Subjective: Patient seen with daughter at bedside.  Says she feels back to her baseline and requesting to go home today.  SOB improved.  On baseline oxygen requirement.  No acute  copmlaints.   Discharge Exam: Vitals:   05/12/20 0832 05/12/20 1143  BP: (!) 129/57 124/74  Pulse: 64 (!) 103  Resp: 20 18  Temp: 98.3 F (36.8 C) 97.7 F (36.5 C)  SpO2: 94% 92%   Vitals:   05/12/20 0440 05/12/20 0817 05/12/20 0832 05/12/20 1143  BP:   (!) 129/57 124/74  Pulse:   64 (!) 103  Resp:   20 18  Temp:   98.3 F (36.8 C) 97.7 F (36.5 C)  TempSrc:   Oral Oral  SpO2:  94% 94% 92%  Weight: 118.7 kg     Height:        General: Pt is alert, awake, not in acute distress, obese Cardiovascular: RRR, S1/S2 +, no rubs, no gallops Respiratory: CTA bilaterally, diminished bases, no wheezing, no rhonchi, normal respiratory effort at rest Abdominal: Soft, NT, ND, bowel sounds + Extremities: no edema, no cyanosis    The results of significant diagnostics from this hospitalization (including imaging, microbiology, ancillary and laboratory) are listed below for reference.     Microbiology: Recent Results (from the past 240 hour(s))  SARS Coronavirus 2 by RT PCR (hospital order, performed in [facility] hospital lab) Nasopharyngeal Nasopharyngeal Swab     Status: None   Collection Time: 05/09/20  7:54 PM   Specimen: Nasopharyngeal Swab  Result Value Ref Range Status   SARS Coronavirus 2 NEGATIVE NEGATIVE Final    Comment: (NOTE) SARS-CoV-2 target nucleic acids are NOT DETECTED.  The SARS-CoV-2 RNA is generally detectable in upper and lower respiratory specimens during the acute phase of infection. The lowest concentration of SARS-CoV-2 viral copies this assay can detect is 250 copies / mL. A negative result does not preclude SARS-CoV-2 infection and should not be used as the sole basis for treatment or other patient management decisions.  A negative result may occur with improper specimen collection / handling, submission of specimen other than nasopharyngeal swab, presence of viral mutation(s) within the areas targeted by this assay, and inadequate number of viral  copies (<250 copies / mL). A negative result must be combined with clinical observations, patient  history, and epidemiological information.  Fact Sheet for Patients:   StrictlyIdeas.no  Fact Sheet for Healthcare Providers: BankingDealers.co.za  This test is not yet approved or  cleared by the [place] FDA and has been authorized for detection and/or diagnosis of SARS-CoV-2 by FDA under an Emergency Use Authorization (EUA).  This EUA will remain in effect (meaning this test can be used) for the duration of the COVID-19 declaration under Section 564(b)(1) of the Act, 21 U.S.C. section 360bbb-3(b)(1), unless the authorization is terminated or revoked sooner.  Performed at [facility], [address] [place], [place] [address]      Labs: BNP (last 3 results) Recent Labs    01/19/20 0433 01/20/20 2335 05/09/20 1848  BNP 66.0 86.8 51.0   Basic Metabolic Panel: Recent Labs  Lab 05/09/20 1848 05/10/20 0254 05/11/20 0740 05/12/20 0711  NA 140 140 141 140  K 3.6 3.5 3.9 3.8  CL 89* 88* 87* 86*  CO2 40* 41* 43* 42*  GLUCOSE 102* 169* 155* 134*  BUN 17 19 31* 35*  CREATININE 1.01* 1.16* 1.08* 1.08*  CALCIUM 9.1 9.2 9.1 9.2  MG  --   --  2.7*  --    Liver Function Tests: No results for input(s): AST, ALT, ALKPHOS, BILITOT, PROT, ALBUMIN in the last 168 hours. No results for input(s): LIPASE, AMYLASE in the last 168 hours. No results for input(s): AMMONIA in the last 168 hours. CBC: Recent Labs  Lab 05/09/20 1848 05/11/20 0740 05/12/20 0711  WBC 12.2* 14.0* 14.1*  NEUTROABS 9.6* 13.4* 13.1*  HGB 10.8* 10.7* 11.0*  HCT 36.5 36.4 36.7  MCV 90.1 89.7 89.3  PLT 320 346 344   Cardiac Enzymes: No results for input(s): CKTOTAL, CKMB, CKMBINDEX, TROPONINI in the last 168 hours. BNP: Invalid input(s): POCBNP CBG: No results for input(s): GLUCAP in the last 168 hours. D-Dimer No results for input(s):  DDIMER in the last 72 hours. Hgb A1c No results for input(s): HGBA1C in the last 72 hours. Lipid Profile No results for input(s): CHOL, HDL, LDLCALC, TRIG, CHOLHDL, LDLDIRECT in the last 72 hours. Thyroid function studies No results for input(s): TSH, T4TOTAL, T3FREE, THYROIDAB in the last 72 hours.  Invalid input(s): FREET3 Anemia work up No results for input(s): VITAMINB12, FOLATE, FERRITIN, TIBC, IRON, RETICCTPCT in the last 72 hours. Urinalysis    Component Value Date/Time   COLORURINE YELLOW (A) 11/09/2019 0607   APPEARANCEUR CLEAR (A) 11/09/2019 0607   LABSPEC 1.032 (H) 11/09/2019 0607   PHURINE 5.0 11/09/2019 0607   GLUCOSEU NEGATIVE 11/09/2019 0607   HGBUR NEGATIVE 11/09/2019 0607   BILIRUBINUR NEGATIVE 11/09/2019 0607   KETONESUR NEGATIVE 11/09/2019 0607   PROTEINUR NEGATIVE 11/09/2019 0607   NITRITE NEGATIVE 11/09/2019 0607   LEUKOCYTESUR NEGATIVE 11/09/2019 0607   Sepsis Labs Invalid input(s): PROCALCITONIN,  WBC,  LACTICIDVEN Microbiology Recent Results (from the past 240 hour(s))  SARS Coronavirus 2 by RT PCR (hospital order, performed in [place] hospital lab) Nasopharyngeal Nasopharyngeal Swab     Status: None   Collection Time: 05/09/20  7:54 PM   Specimen: Nasopharyngeal Swab  Result Value Ref Range Status   SARS Coronavirus 2 NEGATIVE NEGATIVE Final    Comment: (NOTE) SARS-CoV-2 target nucleic acids are NOT DETECTED.  The SARS-CoV-2 RNA is generally detectable in upper and lower respiratory specimens during the acute phase of infection. The lowest concentration of SARS-CoV-2 viral copies this assay can detect is 250 copies / mL. A negative result does not preclude SARS-CoV-2 infection  and should not be used as the sole basis for treatment or other patient management decisions.  A negative result may occur with improper specimen collection / handling, submission of specimen other than nasopharyngeal swab, presence of viral mutation(s) within the areas  targeted by this assay, and inadequate number of viral copies (<250 copies / mL). A negative result must be combined with clinical observations, patient history, and epidemiological information.  Fact Sheet for Patients:   StrictlyIdeas.no  Fact Sheet for Healthcare Providers: BankingDealers.co.za  This test is not yet approved or  cleared by the [place] FDA and has been authorized for detection and/or diagnosis of SARS-CoV-2 by FDA under an Emergency Use Authorization (EUA).  This EUA will remain in effect (meaning this test can be used) for the duration of the COVID-19 declaration under Section 564(b)(1) of the Act, 21 U.S.C. section 360bbb-3(b)(1), unless the authorization is terminated or revoked sooner.  Performed at [facility], [address] [place], [place] [address]      Time coordinating discharge: Over 30 minutes  SIGNED:   Ezekiel Slocumb, DO [facility] 05/12/2020, 3:48 PM   If 7PM-7AM, please contact night-coverage [email or web address]

## 2020-05-12 NOTE — Discharge Instructions (Signed)
Chronic Obstructive Pulmonary Disease Exacerbation Chronic obstructive pulmonary disease (COPD) is a long-term (chronic) lung problem. In COPD, the flow of air from the lungs is limited. COPD exacerbations are times that breathing gets worse and you need more than your normal treatment. Without treatment, they can be life threatening. If they happen often, your lungs can become more damaged. If your COPD gets worse, your doctor may treat you with:  Medicines.  Oxygen.  Different ways to clear your airway, such as using a mask. Follow these instructions at home: Medicines  Take over-the-counter and prescription medicines only as told by your doctor.  If you take an antibiotic or steroid medicine, do not stop taking the medicine even if you start to feel better.  Keep up with shots (vaccinations) as told by your doctor. Be sure to get a yearly (annual) flu shot. Lifestyle  Do not smoke. If you need help quitting, ask your doctor.  Eat healthy foods.  Exercise regularly.  Get plenty of sleep.  Avoid tobacco smoke and other things that can bother your lungs.  Wash your hands often with soap and water. This will help keep you from getting an infection. If you cannot use soap and water, use hand sanitizer.  During flu season, avoid areas that are crowded with people. General instructions  Drink enough fluid to keep your pee (urine) clear or pale yellow. Do not do this if your doctor has told you not to.  Use a cool mist machine (vaporizer).  If you use oxygen or a machine that turns medicine into a mist (nebulizer), continue to use it as told.  Follow all instructions for rehabilitation. These are steps you can take to make your body work better.  Keep all follow-up visits as told by your doctor. This is important. Contact a doctor if:  Your COPD symptoms get worse than normal. Get help right away if:  You are short of breath and it gets worse.  You have trouble  talking.  You have chest pain.  You cough up blood.  You have a fever.  You keep throwing up (vomiting).  You feel weak or you pass out (faint).  You feel confused.  You are not able to sleep because of your symptoms.  You are not able to do daily activities. Summary  COPD exacerbations are times that breathing gets worse and you need more treatment than normal.  COPD exacerbations can be very serious and may cause your lungs to become more damaged.  Do not smoke. If you need help quitting, ask your doctor.  Stay up-to-date on your shots. Get a flu shot every year. This information is not intended to replace advice given to you by your health care provider. Make sure you discuss any questions you have with your health care provider. Document Revised: 08/02/2017 Document Reviewed: 09/24/2016 Elsevier Patient Education  2020 [employer].

## 2020-05-12 NOTE — Care Management Important Message (Signed)
Important Message  Patient Details  Name: Paula Torres MRN: [id number] Date of Birth: Sep 23, 1950   Medicare Important Message Given:  Yes     Dannette Barbara 05/12/2020, 2:54 PM

## 2020-05-23 NOTE — Progress Notes (Signed)
Patient ID: Paula Torres, female    DOB: 1951/07/02, 69 y.o.   MRN: [id number]  HPI  Ms Middlebrooks is a 69 y/o female with a history of HTN, anxiety, COPD, previous tobacco use and minimal LVH.   Echo report from 01/21/20 reviewed and showed an EF of 60-65%. Echo report from 11/09/19 reviewed and showed an EF of 60-65% along with minimal LVH.  Catheterization done 12/21/19 showed:  Ost Cx to Prox Cx lesion is 20% stenosed.  Prox RCA lesion is 40% stenosed.   1.  Moderate one-vessel coronary artery disease involving the right coronary artery.  No evidence of obstructive disease.  Moderately calcified coronary arteries overall. 2.  Left ventricular angiography was not performed.  EF was normal by echo. 3.  Right heart catheterization showed mildly elevated right and left filling pressures, severe pulmonary hypertension and mildly reduced cardiac output.  The patient developed right bundle branch block as the catheter enters the right ventricle.  This, most of the time is transient.  RA: 12 mmHg. RV: 71 over 6 mmHg Pulmonary capillary wedge pressure: 15 mmHg PA: 75/27 with a mean of 47 mmHg. Fick cardiac output is 4.75 with a cardiac index of 2.14. Pulmonary vascular resistance: 6.74 [facility] unit  Admitted 05/09/20 due to COPD exacerbation along with HF exacerbation. Initially had to be placed on bipap. Given IV lasix, solumedrol and antibiotics with transition to oral medications. Discharged after 3 days.   She presents today for a follow-up visit with a chief complaint of minimal shortness of breath upon moderate exertion. She describes this as chronic in nature having been present for several years although is much improved from her recent admission. She has occasional wheezing along with this. She denies any difficulty sleeping, dizziness, abdominal distention, palpitations, chest pain, cough, fatigue or weight gain.   Daughter that is present says that she's noticed swelling in her lower  legs and is trying to have patient prop up her legs. Has recently had her diuretic increased to 76m daily since hospitalization.   Past Medical History:  Diagnosis Date  . [place]   . COPD ([facility] obstructive pulmonary disease) [facility]   . Hypertension    Past Surgical History:  Procedure Laterality Date  . NO PAST SURGERIES    . RIGHT/LEFT HEART CATH AND CORONARY ANGIOGRAPHY Left 12/21/2019   Procedure: RIGHT/LEFT HEART CATH AND CORONARY ANGIOGRAPHY;  Surgeon: AWellington Hampshire MD;  [facility];  Service: Cardiovascular;  Laterality: Left;   Family History  Problem Relation Age of Onset  . Hypertension Other    Social History   Tobacco Use  . Smoking status: Former Smoker    Types: Cigarettes    Quit date: 06/03/2017    Years since quitting: 2.9  . Smokeless tobacco: Never Used  Substance Use Topics  . Alcohol use: Not Currently   Allergies  Allergen Reactions  . Codeine     Back Headache  . Sulfa Antibiotics     Reported to pt from mother who is no longer living -  Details unknown   Prior to Admission medications   Medication Sig Start Date End Date Taking? Authorizing Provider  albuterol (VENTOLIN HFA) 108 (90 Base) MCG/ACT inhaler Inhale 2 puffs into the lungs every 6 (six) hours as needed for wheezing or shortness of breath.   Yes [provider]  amiodarone (PACERONE) 200 MG tablet Take 1 tablet (200 mg total) by mouth daily. 05/13/20  Yes GArbutus Ped  Kelly A, DO  apixaban (ELIQUIS) 5 MG TABS tablet Take 1 tablet (5 mg total) by mouth 2 (two) times daily. 02/22/20  Yes Dunn, Areta Haber, PA-C  atorvastatin (LIPITOR) 20 MG tablet Take 1 tablet (20 mg total) by mouth daily at 6 PM. 11/23/19  Yes Loel Dubonnet, NP  bisoprolol (ZEBETA) 5 MG tablet Take 5 mg by mouth daily. 03/09/20  Yes [provider]  escitalopram (LEXAPRO) 20 MG tablet Take 20 mg by mouth daily.   Yes [provider]  famotidine  (PEPCID) 20 MG tablet Take 20 mg by mouth daily.   Yes [provider]  ferrous gluconate (FERGON) 324 MG tablet Take 324 mg by mouth every morning. 05/04/20  Yes [provider]  fluticasone (FLONASE) 50 MCG/ACT nasal spray Place 2 sprays into both nostrils daily. 05/13/20 06/12/20 Yes Nicole Kindred A, DO  furosemide (LASIX) 40 MG tablet Take 1 tablet (40 mg total) by mouth daily. Patient taking differently: Take 80 mg by mouth daily.  11/23/19  Yes Loel Dubonnet, NP  HYDROcodone-acetaminophen (NORCO/VICODIN) 5-325 MG tablet Take 1 tablet by mouth every 8 (eight) hours as needed. 04/11/20  Yes [provider]  ipratropium (ATROVENT) 0.06 % nasal spray Place 2 sprays into both nostrils 3 (three) times daily as needed. 02/12/20  Yes [provider]  Ipratropium-Albuterol (COMBIVENT RESPIMAT) 20-100 MCG/ACT AERS respimat Inhale 1 puff into the lungs every 6 (six) hours as needed (wheezing/shortness of breath.).    Yes [provider]  ipratropium-albuterol (DUONEB) 0.5-2.5 (3) MG/3ML SOLN Take 3 mLs by nebulization every 6 (six) hours as needed (for wheezing/shortness of breath.).    Yes [provider]  loratadine (CLARITIN) 10 MG tablet Take 1 tablet (10 mg total) by mouth daily. 05/13/20  Yes Nicole Kindred A, DO  montelukast (SINGULAIR) 10 MG tablet Take 10 mg by mouth at bedtime.   Yes [provider]  spironolactone (ALDACTONE) 25 MG tablet Take 1 tablet (25 mg total) by mouth daily. Patient taking differently: Take 50 mg by mouth daily.  11/23/19  Yes Loel Dubonnet, NP  TRELEGY ELLIPTA 100-62.5-25 MCG/INH AEPB Inhale 1 puff into the lungs daily. 11/20/19  Yes [provider]  Treprostinil (TYVASO) 0.6 MG/ML SOLN Inhale 0.6 mLs into the lungs 4 (four) times daily. 03/04/20 03/04/21 Yes [provider]    Review of Systems  Constitutional: Negative for appetite change and fatigue.  HENT: Negative for congestion,  postnasal drip and sore throat.   Eyes: Negative.   Respiratory: Positive for shortness of breath (with moderate exertion) and wheezing. Negative for cough.   Cardiovascular: Negative for chest pain, palpitations and leg swelling.  Gastrointestinal: Negative for abdominal distention and abdominal pain.  Endocrine: Negative.   Genitourinary: Negative.   Musculoskeletal: Negative for back pain and neck pain.  Skin: Negative.   Allergic/Immunologic: Negative.   Neurological: Negative for dizziness and light-headedness.  Hematological: Negative for adenopathy. Does not bruise/bleed easily.  Psychiatric/Behavioral: Negative for dysphoric mood and sleep disturbance (sleeping on 1 pillow with HOB elevated). The patient is not nervous/anxious.    Vitals:   05/25/20 0845  BP: 108/73  Pulse: (!) 58  Resp: 16  SpO2: 90%  Weight: 265 lb 6 oz (120.4 kg)  Height: _0  (1.651 m)   Wt Readings from Last 3 Encounters:  05/25/20 265 lb 6 oz (120.4 kg)  05/12/20 261 lb 11.2 oz (118.7 kg)  02/16/20 275 lb (124.7 kg)   Lab Results  Component Value Date   CREATININE 1.08 (H) 05/12/2020   CREATININE 1.08 (H) 05/11/2020   CREATININE 1.16 (H) 05/10/2020    Physical Exam Vitals and nursing note reviewed.  HENT:     Head: Normocephalic and atraumatic.  Cardiovascular:     Rate and Rhythm: Normal rate and regular rhythm.  Pulmonary:     Effort: Pulmonary effort is normal. No respiratory distress.     Breath sounds: Rales (fine in RLL) present. No wheezing.  Abdominal:     General: There is no distension.     Palpations: Abdomen is soft.     Tenderness: There is no abdominal tenderness.  Musculoskeletal:        General: No tenderness.     Cervical back: Normal range of motion and neck supple.     Right lower leg: Edema (1+ pitting) present.     Left lower leg: Edema (trace pitting) present.  Skin:    General: Skin is warm and dry.     Findings: Wound (multiple excoriations on lower legs/  arms from scratching) present.  Neurological:     General: No focal deficit present.     Mental Status: She is alert and oriented to person, place, and time.  Psychiatric:        Mood and Affect: Mood normal.        Behavior: Behavior normal.        Thought Content: Thought content normal.     Assessment & Plan:  1: Chronic heart failure with preserved ejection fraction without structural changes- - NYHA class II - euvolemic today - not weighing daily; reminded to call for an overnight weight gain of >2 pounds or a weekly weight gain of >5 pounds - weight up 5 pounds from last visit here 6 months ago - not adding salt although daughter says that they've been eating more fast food lately; reviewed the importance of limiting fast food due to sodium content of that type of food - patient says that she drinks "a lot" of fluids and soda; reviewed the importance of keeping daily fluid intake to ~ 60-64 ounces/ day; explained that soda also has sodium in it - discussed possibly changing her furosemide to torsemide if edema doesn't improve with propping up legs and decreasing fluid/ sodium intake - she is to call back next week if edema/ weight don't decline to make diuretic change - sees cardiology Gilford Rile) 11/23/19 - BNP 05/09/20 was 55.6 - reports receiving both covid vaccines  2: COPD/ severe pulmonary HTN- - Gold stage 2 - saw pulmonology Lanney Gins) 02/12/20; encouraged her to call and schedule hospital f/u appointment with him - wearing oxygen at 3L around the clock; checking saturations at home - PFT's done 11/02/19  3: Anxiety- - saw PCP Sabra Heck) 05/04/20 - BMP 05/12/20 reviewed and showed sodium 140, potassium 3.8, creatinine 1.08 and GFR 53 - multiple excoriations on lower legs/ arms/ hands and patient says that her skin doesn't itch and that she "picks" because of her anxiety - counseled on importance of not scratching due to her increase risk of developing cellulitis   Patient did  not bring her medications nor a list. Each medication was verbally reviewed with the patient and she was encouraged to bring the bottles to every visit to confirm accuracy of list.   Return in 3 months or sooner for any questions/ problems before then.

## 2020-05-25 ENCOUNTER — Ambulatory Visit: Payer: Medicare Other | Attending: Family | Admitting: Family

## 2020-05-25 ENCOUNTER — Encounter: Payer: Self-pay | Admitting: Family

## 2020-05-25 ENCOUNTER — Other Ambulatory Visit: Payer: Self-pay

## 2020-05-25 VITALS — BP 108/73 | HR 58 | Resp 16 | Ht 65.0 in | Wt 265.4 lb

## 2020-05-25 DIAGNOSIS — Z7901 Long term (current) use of anticoagulants: Secondary | ICD-10-CM | POA: Insufficient documentation

## 2020-05-25 DIAGNOSIS — Z885 Allergy status to narcotic agent status: Secondary | ICD-10-CM | POA: Insufficient documentation

## 2020-05-25 DIAGNOSIS — I5032 Chronic diastolic (congestive) heart failure: Secondary | ICD-10-CM

## 2020-05-25 DIAGNOSIS — Z882 Allergy status to sulfonamides status: Secondary | ICD-10-CM | POA: Diagnosis not present

## 2020-05-25 DIAGNOSIS — I110 Hypertensive heart disease with heart failure: Secondary | ICD-10-CM | POA: Insufficient documentation

## 2020-05-25 DIAGNOSIS — F424 Excoriation (skin-picking) disorder: Secondary | ICD-10-CM | POA: Insufficient documentation

## 2020-05-25 DIAGNOSIS — Z79899 Other long term (current) drug therapy: Secondary | ICD-10-CM | POA: Insufficient documentation

## 2020-05-25 DIAGNOSIS — Z87891 Personal history of nicotine dependence: Secondary | ICD-10-CM | POA: Insufficient documentation

## 2020-05-25 DIAGNOSIS — J441 Chronic obstructive pulmonary disease with (acute) exacerbation: Secondary | ICD-10-CM | POA: Insufficient documentation

## 2020-05-25 DIAGNOSIS — I272 Pulmonary hypertension, unspecified: Secondary | ICD-10-CM | POA: Insufficient documentation

## 2020-05-25 DIAGNOSIS — F419 Anxiety disorder, unspecified: Secondary | ICD-10-CM

## 2020-05-25 DIAGNOSIS — I2510 Atherosclerotic heart disease of native coronary artery without angina pectoris: Secondary | ICD-10-CM | POA: Insufficient documentation

## 2020-05-25 DIAGNOSIS — J449 Chronic obstructive pulmonary disease, unspecified: Secondary | ICD-10-CM

## 2020-05-25 NOTE — Patient Instructions (Signed)
Continue weighing daily and call for an overnight weight gain of > 2 pounds or a weekly weight gain of >5 pounds. 

## 2020-05-29 ENCOUNTER — Encounter: Payer: Self-pay | Admitting: Physician Assistant

## 2020-05-29 NOTE — Progress Notes (Signed)
Cardiology Office Note    Date:  05/30/2020   ID:  Paula Torres, DOB September 10, 1950, MRN [id number]  PCP:  Rusty Aus, MD  Cardiologist:  Kathlyn Sacramento, MD  Electrophysiologist:  None   Chief Complaint: Follow-up  History of Present Illness:   Paula Torres is a 69 y.o. female with history of nonobstructive CAD by [facility] in 12/2019, PAF diagnosed in 01/2020 on Eliquis, HFpEF, chronic hypoxic respiratory failure on supplemental oxygen via nasal cannula at 3 L, severe pulmonary hypertension, COPD, HTN, morbid obesity, and OSA who presents for follow-up of her HFpEF and PAF.   She was admitted in 11/2019 with a COPD exacerbation and acute on chronic HFpEF. Echo during that admission showed an EF of 60 to 65%, mild LVH, grade 1 diastolic dysfunction, normal RV systolic function and ventricular cavity size, mildly dilated left atrium, and no significant valvular abnormalities. She had a mild troponin elevation, felt to be related to supply demand ischemia. During her admission, she was noted to have atrial tachycardia which was treated with bisoprolol. She subsequently underwent outpatient Lexiscan MPI on 12/08/2019 which was intermediate risk as outlined below with an EF of 59%. In this setting, she underwent diagnostic R/LHC on 12/21/2019 which showed moderate one-vessel nonobstructive CAD involving the RCA and moderately calcified coronary arteries overall. Medical management was advised. RHC showed mildly elevated right and left filling pressures, severe pulmonary hypertension, and mildly reduced cardiac output. In this setting, she was referred to pulmonology and started on vasodilator therapy. She was readmitted in 01/2020 with recurrent COPD exacerbation/HFpEF and noted to be in new onset Afib with RVR. She converted to sinus rhythm while admitted on amiodarone and digoxin. She was seen in the ED in 02/2020 after tripping leading to a fall with a fracture of her left humerus.   She was admitted to the  hospital from 9/6 to 9/9 for a COPD exacerbation and acute on chronic HFpEF. HS-Tn normal. BNP 55. Treated initially with BiPAP along with IV Lasix, steroids, and antibiotics.  She was restarted on amiodarone at hospital discharge though prior to admission had not been taking this as it was discontinued by outside office.  Following her admission, she has been seen by the [facility] on 05/25/2020. There was some noted lower extremity swelling. She was noted to have been drinking large amounts of liquids. No medications were changed.   She comes in accompanied by her daughter today and is doing very well from a cardiac perspective.  She indicates since her most recent hospitalization she feels better than her prior baseline.  She remains on supplemental oxygen via nasal cannula at 3 L.  At baseline over the past several months she has slept in a recliner though this is because her husband does and she wants to be near to him.  She has also done this in the setting of her recent humerus fracture.  She denies any chest pain, palpitations, dizziness, presyncope, or syncope.  Lower extremity swelling is stable to improved.  She has not had any falls, hematochezia, or melena.  She has follow-up with pulmonology next week.  She does not have any issues or concerns at this time.   Labs independently reviewed: 05/2020 - HGB 11.0, PLT 344, potassium 3.8, BUN 35, SCr 1.08, magnesium 2.7 01/2020 - albumin 3.9, AST/ALT normal, A1c 5.7 11/2019 - TC 218, TG 85, HDL 49, LDL 152, TSH normal  Past Medical History:  Diagnosis Date  . (HFpEF) heart failure with  preserved ejection fraction ([place])   . Chronic respiratory failure with hypoxia ([place])   . COPD (chronic obstructive pulmonary disease) ([place])   . Coronary artery disease, non-occlusive   . Hypertension   . OSA (obstructive sleep apnea)   . PAF (paroxysmal atrial fibrillation) ([place])   . Pulmonary hypertension ([place])     Past Surgical History:  Procedure  Laterality Date  . NO PAST SURGERIES    . RIGHT/LEFT HEART CATH AND CORONARY ANGIOGRAPHY Left 12/21/2019   Procedure: RIGHT/LEFT HEART CATH AND CORONARY ANGIOGRAPHY;  Surgeon: Wellington Hampshire, MD;  Location: [facility];  Service: Cardiovascular;  Laterality: Left;    Current Medications: Current Meds  Medication Sig  . albuterol (VENTOLIN HFA) 108 (90 Base) MCG/ACT inhaler Inhale 2 puffs into the lungs every 6 (six) hours as needed for wheezing or shortness of breath.  Marland Kitchen amiodarone (PACERONE) 200 MG tablet Take 1 tablet (200 mg total) by mouth daily. (Patient taking differently: Take 100 mg by mouth daily. )  . apixaban (ELIQUIS) 5 MG TABS tablet Take 1 tablet (5 mg total) by mouth 2 (two) times daily.  Marland Kitchen atorvastatin (LIPITOR) 20 MG tablet Take 1 tablet (20 mg total) by mouth daily at 6 PM.  . bisoprolol (ZEBETA) 5 MG tablet Take 5 mg by mouth daily.  Marland Kitchen escitalopram (LEXAPRO) 20 MG tablet Take 20 mg by mouth daily.  . famotidine (PEPCID) 20 MG tablet Take 20 mg by mouth daily.  . ferrous gluconate (FERGON) 324 MG tablet Take 324 mg by mouth every morning.  . fluticasone (FLONASE) 50 MCG/ACT nasal spray Place 2 sprays into both nostrils daily.  . furosemide (LASIX) 40 MG tablet Take 1 tablet (40 mg total) by mouth daily. (Patient taking differently: Take 80 mg by mouth daily. )  . guaiFENesin (MUCINEX) 600 MG 12 hr tablet Take 600 mg by mouth daily.  Marland Kitchen HYDROcodone-acetaminophen (NORCO/VICODIN) 5-325 MG tablet Take 1 tablet by mouth every 8 (eight) hours as needed.  Marland Kitchen ipratropium (ATROVENT) 0.06 % nasal spray Place 2 sprays into both nostrils 3 (three) times daily as needed.  . Ipratropium-Albuterol (COMBIVENT RESPIMAT) 20-100 MCG/ACT AERS respimat Inhale 1 puff into the lungs every 6 (six) hours as needed (wheezing/shortness of breath.).   Marland Kitchen ipratropium-albuterol (DUONEB) 0.5-2.5 (3) MG/3ML SOLN Take 3 mLs by nebulization every 6 (six) hours as needed (for wheezing/shortness of  breath.).   Marland Kitchen loratadine (CLARITIN) 10 MG tablet Take 1 tablet (10 mg total) by mouth daily.  . montelukast (SINGULAIR) 10 MG tablet Take 10 mg by mouth at bedtime.  Marland Kitchen spironolactone (ALDACTONE) 25 MG tablet Take 1 tablet (25 mg total) by mouth daily. (Patient taking differently: Take 50 mg by mouth daily. )  . TRELEGY ELLIPTA 100-62.5-25 MCG/INH AEPB Inhale 1 puff into the lungs daily.  . Treprostinil (TYVASO) 0.6 MG/ML SOLN Inhale 0.6 mLs into the lungs 4 (four) times daily.    Allergies:   Codeine and Sulfa antibiotics   Social History   Socioeconomic History  . Marital status: Married    Spouse name: Not on file  . Number of children: Not on file  . Years of education: Not on file  . Highest education level: Not on file  Occupational History  . Not on file  Tobacco Use  . Smoking status: Former Smoker    Types: Cigarettes    Quit date: 06/03/2017    Years since quitting: 2.9  . Smokeless tobacco: Never Used  Vaping Use  . Vaping  Use: Never used  Substance and Sexual Activity  . Alcohol use: Not Currently  . Drug use: Never  . Sexual activity: Not on file  Other Topics Concern  . Not on file  Social History Narrative  . Not on file   Social Determinants of Health   Financial Resource Strain:   . Difficulty of Paying Living Expenses: Not on file  Food Insecurity:   . Worried About Charity fundraiser in the Last Year: Not on file  . Ran Out of Food in the Last Year: Not on file  Transportation Needs:   . Lack of Transportation (Medical): Not on file  . Lack of Transportation (Non-Medical): Not on file  Physical Activity:   . Days of Exercise per Week: Not on file  . Minutes of Exercise per Session: Not on file  Stress:   . Feeling of Stress : Not on file  Social Connections:   . Frequency of Communication with Friends and Family: Not on file  . Frequency of Social Gatherings with Friends and Family: Not on file  . Attends Religious Services: Not on file  .  Active Member of Clubs or Organizations: Not on file  . Attends Archivist Meetings: Not on file  . Marital Status: Not on file     Family History:  The patient's family history includes Hypertension in an other family member.  ROS:   Review of Systems  Constitutional: Positive for malaise/fatigue. Negative for chills, diaphoresis, fever and weight loss.  HENT: Negative for congestion.   Eyes: Negative for discharge and redness.  Respiratory: Positive for shortness of breath. Negative for cough, sputum production and wheezing.   Cardiovascular: Positive for leg swelling. Negative for chest pain, palpitations, orthopnea, claudication and PND.  Gastrointestinal: Negative for abdominal pain, blood in stool, heartburn, melena, nausea and vomiting.  Musculoskeletal: Negative for falls and myalgias.  Skin: Negative for rash.  Neurological: Positive for weakness. Negative for dizziness, tingling, tremors, sensory change, speech change, focal weakness and loss of consciousness.  Endo/Heme/Allergies: Does not bruise/bleed easily.  Psychiatric/Behavioral: Negative for substance abuse. The patient is not nervous/anxious.   All other systems reviewed and are negative.    EKGs/Labs/Other Studies Reviewed:    Studies reviewed were summarized above. The additional studies were reviewed today:  2D echo 11/2019: 1. Left ventricular ejection fraction, by estimation, is 60 to 65%. The  left ventricle has normal function. The left ventricle has no regional  wall motion abnormalities. There is mild left ventricular hypertrophy.  Left ventricular diastolic parameters  are consistent with Grade I diastolic dysfunction (impaired relaxation).  2. Right ventricular systolic function is normal. The right ventricular  size is normal. Tricuspid regurgitation signal is inadequate for assessing  PA pressure.  3. Left atrial size was mildly dilated.  4. The mitral valve is normal in structure. No  evidence of mitral valve  regurgitation. No evidence of mitral stenosis.  5. The aortic valve is normal in structure. Aortic valve regurgitation is  not visualized. No aortic stenosis is present.  6. The inferior vena cava is normal in size with greater than 50%  respiratory variability, suggesting right atrial pressure of 3 mmHg.  __________  [facility] 12/2019: Ost Cx to Prox Cx lesion is 20% stenosed.  Prox RCA lesion is 40% stenosed.   1.  Moderate one-vessel coronary artery disease involving the right coronary artery.  No evidence of obstructive disease.  Moderately calcified coronary arteries overall. 2.  Left ventricular angiography  was not performed.  EF was normal by echo. 3.  Right heart catheterization showed mildly elevated right and left filling pressures, severe pulmonary hypertension and mildly reduced cardiac output.  The patient developed right bundle branch block as the catheter enters the right ventricle.  This, most of the time is transient.  RA: 12 mmHg. RV: 71 over 6 mmHg Pulmonary capillary wedge pressure: 15 mmHg PA: 75/27 with a mean of 47 mmHg. Fick cardiac output is 4.75 with a cardiac index of 2.14. Pulmonary vascular resistance: 6.74 [place] unit  Recommendations: Recommend medical therapy for nonobstructive coronary artery disease. Right heart catheterization does confirm severe pulmonary hypertension which is likely mixed in etiology but right now appears to be mostly arterial hypertension given the wedge pressure is only 15 mmHg.  Recommend treatment of underlying sleep apnea and will forward this information to pulmonary for further management. __________  2D echo 01/2020: 1. Left ventricular ejection fraction, by estimation, is 60 to 65%. The  left ventricle has normal function. The left ventricle has no regional  wall motion abnormalities. Left ventricular diastolic parameters are  indeterminate.  2. Right ventricular systolic function was not well  visualized. The right  ventricular size is normal. Tricuspid regurgitation signal is inadequate  for assessing PA pressure.  3. The inferior vena cava is dilated in size with <50% respiratory  variability, suggesting right atrial pressure of 15 mmHg.  4. Rhythm is atrial fibrillation   EKG:  EKG is not ordered today.   Recent Labs: 01/19/2020: ALT 14 01/20/2020: TSH 1.891 05/09/2020: B Natriuretic Peptide 55.6 05/11/2020: Magnesium 2.7 05/12/2020: BUN 35; Creatinine, Ser 1.08; Hemoglobin 11.0; Platelets 344; Potassium 3.8; Sodium 140  Recent Lipid Panel    Component Value Date/Time   CHOL 218 (H) 11/09/2019 0914   TRIG 85 11/09/2019 0914   HDL 49 11/09/2019 0914   CHOLHDL 4.4 11/09/2019 0914   VLDL 17 11/09/2019 0914   LDLCALC 152 (H) 11/09/2019 0914    PHYSICAL EXAM:    VS:  BP 108/60 (BP Location: Right Arm, Patient Position: Sitting, Cuff Size: Large)   Pulse 60   Ht 5' 5.5" (1.664 m)   Wt 266 lb (120.7 kg)   SpO2 94%   BMI 43.59 kg/m   BMI: Body mass index is 43.59 kg/m.  Physical Exam Constitutional:      Appearance: She is well-developed.  HENT:     Head: Normocephalic and atraumatic.  Eyes:     General:        Right eye: No discharge.        Left eye: No discharge.  Neck:     Vascular: No JVD.  Cardiovascular:     Rate and Rhythm: Normal rate and regular rhythm.     Pulses: No midsystolic click and no opening snap.     Heart sounds: Normal heart sounds, S1 normal and S2 normal. Heart sounds not distant. No murmur heard.  No friction rub.  Pulmonary:     Effort: Pulmonary effort is normal. No respiratory distress.     Breath sounds: Decreased breath sounds present. No wheezing or rales.  Chest:     Chest wall: No tenderness.  Abdominal:     General: There is no distension.     Palpations: Abdomen is soft.     Tenderness: There is no abdominal tenderness.  Musculoskeletal:     Cervical back: Normal range of motion.     Right lower leg: Edema present.      Left  lower leg: Edema present.     Comments: Trace bilateral pretibial edema with chronic woody appearance.  Skin:    General: Skin is warm and dry.     Nails: There is no clubbing.  Neurological:     Mental Status: She is alert and oriented to person, place, and time.  Psychiatric:        Speech: Speech normal.        Behavior: Behavior normal.        Thought Content: Thought content normal.        Judgment: Judgment normal.     Wt Readings from Last 3 Encounters:  05/30/20 266 lb (120.7 kg)  05/25/20 265 lb 6 oz (120.4 kg)  05/12/20 261 lb 11.2 oz (118.7 kg)     ASSESSMENT & PLAN:   1. Nonobstructive CAD: No symptoms concerning for angina.  Continue current medical therapy including Eliquis in place of aspirin, atorvastatin, and bisoprolol.  No indication for further ischemic testing at this time.  2. HFpEF: Volume status is difficult to assess on exam secondary to body habitus though she appears euvolemic and well compensated.  Weight is stable.  Continue Lasix and spironolactone.  Recent BMP demonstrated stable renal function and potassium as outlined above.  3. PAF: Maintaining sinus rhythm.  Prior to her most recent admission amiodarone had been discontinued by outside group.  This was restarted during her most recent admission and continued at discharge.  Given the patient's underlying pulmonary status and in the setting of her having an isolated episode of A. fib we have elected to discontinue amiodarone.  She will continue rate control strategy with bisoprolol.  Given her CHA2DS2-VASc of at least 4 she will continue Eliquis.  No symptoms concerning for bleeding.  Recent CBC demonstrated stable hemoglobin as above.  4. Chronic hypoxic respiratory failure: Stable.  Remains on supplemental oxygen via nasal cannula at 3 L.  Followed by pulmonology.  5. Pulmonary hypertension: Likely in the setting of her underlying COPD with morbid obesity, possible OSA and OHS.  She remains on  a Lasix.  Followed by pulmonology.  Could consider referral to pulmonary hypertension clinic in [place] if okay with pulmonology, though for now she would like to remain locally.  6. COPD: No acute exacerbation.  Symptoms significantly improved.  Follow-up with pulmonology as directed.  7. HTN: Blood pressure is well controlled.  She remains on bisoprolol, Lasix, and spironolactone.  Recent BMP demonstrated stable renal function and potassium.  Disposition: F/u with Dr. Fletcher Anon or an APP in 3 months.   Medication Adjustments/Labs and Tests Ordered: Current medicines are reviewed at length with the patient today.  Concerns regarding medicines are outlined above. Medication changes, Labs and Tests ordered today are summarized above and listed in the Patient Instructions accessible in Encounters.   Signed, Christell Faith, PA-C 05/30/2020 1:57 PM     [address] [place], [place] [address] [phone]

## 2020-05-30 ENCOUNTER — Ambulatory Visit: Payer: Medicare Other | Admitting: Physician Assistant

## 2020-05-30 ENCOUNTER — Encounter: Payer: Self-pay | Admitting: Physician Assistant

## 2020-05-30 ENCOUNTER — Other Ambulatory Visit: Payer: Self-pay | Admitting: Physician Assistant

## 2020-05-30 ENCOUNTER — Other Ambulatory Visit: Payer: Self-pay

## 2020-05-30 VITALS — BP 108/60 | HR 60 | Ht 65.5 in | Wt 266.0 lb

## 2020-05-30 DIAGNOSIS — I2510 Atherosclerotic heart disease of native coronary artery without angina pectoris: Secondary | ICD-10-CM | POA: Diagnosis not present

## 2020-05-30 DIAGNOSIS — I10 Essential (primary) hypertension: Secondary | ICD-10-CM

## 2020-05-30 DIAGNOSIS — I272 Pulmonary hypertension, unspecified: Secondary | ICD-10-CM

## 2020-05-30 DIAGNOSIS — J449 Chronic obstructive pulmonary disease, unspecified: Secondary | ICD-10-CM

## 2020-05-30 DIAGNOSIS — I480 Paroxysmal atrial fibrillation: Secondary | ICD-10-CM | POA: Diagnosis not present

## 2020-05-30 DIAGNOSIS — J9611 Chronic respiratory failure with hypoxia: Secondary | ICD-10-CM

## 2020-05-30 DIAGNOSIS — I5032 Chronic diastolic (congestive) heart failure: Secondary | ICD-10-CM | POA: Diagnosis not present

## 2020-05-30 NOTE — Patient Instructions (Signed)
Medication Instructions:  Your physician has recommended you make the following change in your medication:   STOP Amiodarone  Continue other medications as prescribed.  *If you need a refill on your cardiac medications before your next appointment, please call your pharmacy*   Lab Work: None ordered If you have labs (blood work) drawn today and your tests are completely normal, you will receive your results only by: Marland Kitchen MyChart Message (if you have MyChart) OR . A paper copy in the mail If you have any lab test that is abnormal or we need to change your treatment, we will call you to review the results.   Testing/Procedures: None ordered   Follow-Up: At [facility], you and your health needs are our priority.  As part of our continuing mission to provide you with exceptional heart care, we have created designated Provider Care Teams.  These Care Teams include your primary Cardiologist (physician) and Advanced Practice Providers (APPs -  Physician Assistants and Nurse Practitioners) who all work together to provide you with the care you need, when you need it.  We recommend signing up for the patient portal called "MyChart".  Sign up information is provided on this After Visit Summary.  MyChart is used to connect with patients for Virtual Visits (Telemedicine).  Patients are able to view lab/test results, encounter notes, upcoming appointments, etc.  Non-urgent messages can be sent to your provider as well.   To learn more about what you can do with MyChart, go to [email or web address].    Your next appointment:   3 month(s)  The format for your next appointment:   In Person  Provider:   You may see Kathlyn Sacramento, MD or one of the following Advanced Practice Providers on your designated Care Team:    Murray Hodgkins, NP  Christell Faith, PA-C  Marrianne Mood, PA-C  Cadence Kathlen Mody, [place]    Other Instructions N/A

## 2020-05-30 NOTE — Telephone Encounter (Signed)
Refill Request.  

## 2020-05-30 NOTE — Telephone Encounter (Signed)
Pt's age 69, wt 120.7 kg, SCr 1.08, CrCl 95, last ov w/ RD 05/30/20.

## 2020-06-07 ENCOUNTER — Other Ambulatory Visit: Payer: Self-pay | Admitting: Internal Medicine

## 2020-06-07 DIAGNOSIS — D500 Iron deficiency anemia secondary to blood loss (chronic): Secondary | ICD-10-CM

## 2020-06-07 DIAGNOSIS — J449 Chronic obstructive pulmonary disease, unspecified: Secondary | ICD-10-CM

## 2020-06-07 DIAGNOSIS — Z87891 Personal history of nicotine dependence: Secondary | ICD-10-CM

## 2020-06-15 ENCOUNTER — Ambulatory Visit
Admission: RE | Admit: 2020-06-15 | Discharge: 2020-06-15 | Disposition: A | Discharge status: Home / Self Care | Payer: Medicare Other | Source: Ambulatory Visit | Attending: Internal Medicine | Admitting: Internal Medicine

## 2020-06-15 ENCOUNTER — Other Ambulatory Visit: Payer: Self-pay

## 2020-06-15 DIAGNOSIS — D500 Iron deficiency anemia secondary to blood loss (chronic): Secondary | ICD-10-CM | POA: Diagnosis present

## 2020-06-15 DIAGNOSIS — J449 Chronic obstructive pulmonary disease, unspecified: Secondary | ICD-10-CM | POA: Insufficient documentation

## 2020-06-15 DIAGNOSIS — Z87891 Personal history of nicotine dependence: Secondary | ICD-10-CM | POA: Insufficient documentation

## 2020-06-15 LAB — POCT I-STAT CREATININE: Creatinine, Ser: 1.3 mg/dL — ABNORMAL HIGH (ref 0.44–1.00)

## 2020-06-15 MED ORDER — IOHEXOL 300 MG/ML  SOLN
75.0000 mL | Freq: Once | INTRAMUSCULAR | Status: AC | PRN
  Administered 2020-06-15: 75 mL via INTRAVENOUS

## 2020-08-20 NOTE — Progress Notes (Deleted)
Patient ID: Paula Torres, female    DOB: 1951/03/19, 69 y.o.   MRN: [id number]  HPI  Paula Torres is a 69 y/o female with a history of HTN, anxiety, COPD, previous tobacco use and minimal LVH.   Echo report from 01/21/20 reviewed and showed an EF of 60-65%. Echo report from 11/09/19 reviewed and showed an EF of 60-65% along with minimal LVH.  Catheterization done 12/21/19 showed:  Ost Cx to Prox Cx lesion is 20% stenosed.  Prox RCA lesion is 40% stenosed.   1.  Moderate one-vessel coronary artery disease involving the right coronary artery.  No evidence of obstructive disease.  Moderately calcified coronary arteries overall. 2.  Left ventricular angiography was not performed.  EF was normal by echo. 3.  Right heart catheterization showed mildly elevated right and left filling pressures, severe pulmonary hypertension and mildly reduced cardiac output.  The patient developed right bundle branch block as the catheter enters the right ventricle.  This, most of the time is transient.  RA: 12 mmHg. RV: 71 over 6 mmHg Pulmonary capillary wedge pressure: 15 mmHg PA: 75/27 with a mean of 47 mmHg. Fick cardiac output is 4.75 with a cardiac index of 2.14. Pulmonary vascular resistance: 6.74 [place] unit  Admitted 05/09/20 due to COPD exacerbation along with HF exacerbation. Initially had to be placed on bipap. Given IV lasix, solumedrol and antibiotics with transition to oral medications. Discharged after 3 days.   She presents today for a follow-up visit with a chief complaint of   Past Medical History:  Diagnosis Date  . (HFpEF) heart failure with preserved ejection fraction ([place])   . Chronic respiratory failure with hypoxia ([place])   . COPD (chronic obstructive pulmonary disease) ([place])   . Coronary artery disease, non-occlusive   . Hypertension   . OSA (obstructive sleep apnea)   . PAF (paroxysmal atrial fibrillation) ([place])   . Pulmonary hypertension ([place])    Past Surgical History:   Procedure Laterality Date  . NO PAST SURGERIES    . RIGHT/LEFT HEART CATH AND CORONARY ANGIOGRAPHY Left 12/21/2019   Procedure: RIGHT/LEFT HEART CATH AND CORONARY ANGIOGRAPHY;  Surgeon: Wellington Hampshire, MD;  Location: [facility];  Service: Cardiovascular;  Laterality: Left;   Family History  Problem Relation Age of Onset  . Hypertension Other    Social History   Tobacco Use  . Smoking status: Former Smoker    Types: Cigarettes    Quit date: 06/03/2017    Years since quitting: 3.2  . Smokeless tobacco: Never Used  Substance Use Topics  . Alcohol use: Not Currently   Allergies  Allergen Reactions  . Codeine     Back Headache  . Sulfa Antibiotics     Reported to pt from mother who is no longer living -  Details unknown     Review of Systems  Constitutional: Negative for appetite change and fatigue.  HENT: Negative for congestion, postnasal drip and sore throat.   Eyes: Negative.   Respiratory: Positive for shortness of breath (with moderate exertion) and wheezing. Negative for cough.   Cardiovascular: Negative for chest pain, palpitations and leg swelling.  Gastrointestinal: Negative for abdominal distention and abdominal pain.  Endocrine: Negative.   Genitourinary: Negative.   Musculoskeletal: Negative for back pain and neck pain.  Skin: Negative.   Allergic/Immunologic: Negative.   Neurological: Negative for dizziness and light-headedness.  Hematological: Negative for adenopathy. Does not bruise/bleed easily.  Psychiatric/Behavioral: Negative for dysphoric mood and sleep disturbance (sleeping  on 1 pillow with HOB elevated). The patient is not nervous/anxious.      Physical Exam Vitals and nursing note reviewed.  HENT:     Head: Normocephalic and atraumatic.  Cardiovascular:     Rate and Rhythm: Normal rate and regular rhythm.  Pulmonary:     Effort: Pulmonary effort is normal. No respiratory distress.     Breath sounds: Rales (fine in RLL) present. No  wheezing.  Abdominal:     General: There is no distension.     Palpations: Abdomen is soft.     Tenderness: There is no abdominal tenderness.  Musculoskeletal:        General: No tenderness.     Cervical back: Normal range of motion and neck supple.     Right lower leg: Edema (1+ pitting) present.     Left lower leg: Edema (trace pitting) present.  Skin:    General: Skin is warm and dry.     Findings: Wound (multiple excoriations on lower legs/ arms from scratching) present.  Neurological:     General: No focal deficit present.     Mental Status: She is alert and oriented to person, place, and time.  Psychiatric:        Mood and Affect: Mood normal.        Behavior: Behavior normal.        Thought Content: Thought content normal.     Assessment & Plan:  1: Chronic heart failure with preserved ejection fraction without structural changes- - NYHA class II - euvolemic today - not weighing daily; reminded to call for an overnight weight gain of >2 pounds or a weekly weight gain of >5 pounds - weight 265.6 pounds from last visit here 3 months ago - not adding salt although daughter says that they've been eating more fast food lately; reviewed the importance of limiting fast food due to sodium content of that type of food - patient says that she drinks "a lot" of fluids and soda; reviewed the importance of keeping daily fluid intake to ~ 60-64 ounces/ day; explained that soda also has sodium in it - sees cardiology ([facility]) 05/30/20 - BNP 05/09/20 was 55.6 - reports receiving both covid vaccines  2: COPD/ severe pulmonary HTN- - Gold stage 2 - saw pulmonology Lanney Gins) 06/15/20 - wearing oxygen at 3L around the clock; checking saturations at home - PFT's done 11/02/19  3: Anxiety- - saw PCP Sabra Heck) 05/04/20 - BMP 05/12/20 reviewed and showed sodium 140, potassium 3.8, creatinine 1.08 and GFR 53 - multiple excoriations on lower legs/ arms/ hands and patient says that her skin doesn't  itch and that she "picks" because of her anxiety   Patient did not bring her medications nor a list. Each medication was verbally reviewed with the patient and she was encouraged to bring the bottles to every visit to confirm accuracy of list.

## 2020-08-22 ENCOUNTER — Ambulatory Visit: Payer: Medicare Other | Admitting: Family

## 2020-09-05 ENCOUNTER — Ambulatory Visit: Payer: Medicare Other | Admitting: Physician Assistant

## 2020-09-21 ENCOUNTER — Ambulatory Visit: Payer: Medicare Other | Admitting: Family

## 2021-01-10 ENCOUNTER — Other Ambulatory Visit (HOSPITAL_COMMUNITY): Payer: Self-pay | Admitting: Family Medicine

## 2021-01-10 ENCOUNTER — Other Ambulatory Visit: Payer: Self-pay | Admitting: Family Medicine

## 2021-01-10 DIAGNOSIS — M5416 Radiculopathy, lumbar region: Secondary | ICD-10-CM

## 2021-01-19 ENCOUNTER — Other Ambulatory Visit: Payer: Self-pay

## 2021-01-19 ENCOUNTER — Ambulatory Visit
Admission: RE | Admit: 2021-01-19 | Discharge: 2021-01-19 | Disposition: A | Discharge status: Home / Self Care | Payer: Medicare Other | Source: Ambulatory Visit | Attending: Family Medicine | Admitting: Family Medicine

## 2021-01-19 DIAGNOSIS — M5416 Radiculopathy, lumbar region: Secondary | ICD-10-CM | POA: Diagnosis present

## 2021-02-10 DIAGNOSIS — I110 Hypertensive heart disease with heart failure: Secondary | ICD-10-CM | POA: Diagnosis not present

## 2021-02-10 DIAGNOSIS — M4726 Other spondylosis with radiculopathy, lumbar region: Secondary | ICD-10-CM | POA: Diagnosis not present

## 2021-02-10 DIAGNOSIS — I4891 Unspecified atrial fibrillation: Secondary | ICD-10-CM | POA: Diagnosis not present

## 2021-02-10 DIAGNOSIS — I5032 Chronic diastolic (congestive) heart failure: Secondary | ICD-10-CM | POA: Diagnosis not present

## 2021-02-10 DIAGNOSIS — M5416 Radiculopathy, lumbar region: Secondary | ICD-10-CM | POA: Diagnosis not present

## 2021-02-10 DIAGNOSIS — M5136 Other intervertebral disc degeneration, lumbar region: Secondary | ICD-10-CM | POA: Diagnosis not present

## 2021-02-10 DIAGNOSIS — G8929 Other chronic pain: Secondary | ICD-10-CM | POA: Diagnosis not present

## 2021-02-10 DIAGNOSIS — I2781 Cor pulmonale (chronic): Secondary | ICD-10-CM | POA: Diagnosis not present

## 2021-02-10 DIAGNOSIS — J449 Chronic obstructive pulmonary disease, unspecified: Secondary | ICD-10-CM | POA: Diagnosis not present

## 2021-02-10 DIAGNOSIS — D649 Anemia, unspecified: Secondary | ICD-10-CM | POA: Diagnosis not present

## 2021-02-10 DIAGNOSIS — M5116 Intervertebral disc disorders with radiculopathy, lumbar region: Secondary | ICD-10-CM | POA: Diagnosis not present

## 2021-02-19 DIAGNOSIS — J449 Chronic obstructive pulmonary disease, unspecified: Secondary | ICD-10-CM | POA: Diagnosis not present

## 2021-02-24 DIAGNOSIS — M4726 Other spondylosis with radiculopathy, lumbar region: Secondary | ICD-10-CM | POA: Diagnosis not present

## 2021-02-24 DIAGNOSIS — M5116 Intervertebral disc disorders with radiculopathy, lumbar region: Secondary | ICD-10-CM | POA: Diagnosis not present

## 2021-02-24 DIAGNOSIS — D649 Anemia, unspecified: Secondary | ICD-10-CM | POA: Diagnosis not present

## 2021-02-24 DIAGNOSIS — I2781 Cor pulmonale (chronic): Secondary | ICD-10-CM | POA: Diagnosis not present

## 2021-02-24 DIAGNOSIS — G8929 Other chronic pain: Secondary | ICD-10-CM | POA: Diagnosis not present

## 2021-02-24 DIAGNOSIS — I5032 Chronic diastolic (congestive) heart failure: Secondary | ICD-10-CM | POA: Diagnosis not present

## 2021-02-24 DIAGNOSIS — I110 Hypertensive heart disease with heart failure: Secondary | ICD-10-CM | POA: Diagnosis not present

## 2021-02-24 DIAGNOSIS — J449 Chronic obstructive pulmonary disease, unspecified: Secondary | ICD-10-CM | POA: Diagnosis not present

## 2021-02-24 DIAGNOSIS — I4891 Unspecified atrial fibrillation: Secondary | ICD-10-CM | POA: Diagnosis not present

## 2021-02-27 DIAGNOSIS — M5116 Intervertebral disc disorders with radiculopathy, lumbar region: Secondary | ICD-10-CM | POA: Diagnosis not present

## 2021-02-27 DIAGNOSIS — I2781 Cor pulmonale (chronic): Secondary | ICD-10-CM | POA: Diagnosis not present

## 2021-02-27 DIAGNOSIS — D649 Anemia, unspecified: Secondary | ICD-10-CM | POA: Diagnosis not present

## 2021-02-27 DIAGNOSIS — I4891 Unspecified atrial fibrillation: Secondary | ICD-10-CM | POA: Diagnosis not present

## 2021-02-27 DIAGNOSIS — J449 Chronic obstructive pulmonary disease, unspecified: Secondary | ICD-10-CM | POA: Diagnosis not present

## 2021-02-27 DIAGNOSIS — I110 Hypertensive heart disease with heart failure: Secondary | ICD-10-CM | POA: Diagnosis not present

## 2021-02-27 DIAGNOSIS — I5032 Chronic diastolic (congestive) heart failure: Secondary | ICD-10-CM | POA: Diagnosis not present

## 2021-02-27 DIAGNOSIS — G8929 Other chronic pain: Secondary | ICD-10-CM | POA: Diagnosis not present

## 2021-02-27 DIAGNOSIS — M4726 Other spondylosis with radiculopathy, lumbar region: Secondary | ICD-10-CM | POA: Diagnosis not present

## 2021-03-02 DIAGNOSIS — M5116 Intervertebral disc disorders with radiculopathy, lumbar region: Secondary | ICD-10-CM | POA: Diagnosis not present

## 2021-03-02 DIAGNOSIS — I4891 Unspecified atrial fibrillation: Secondary | ICD-10-CM | POA: Diagnosis not present

## 2021-03-02 DIAGNOSIS — G8929 Other chronic pain: Secondary | ICD-10-CM | POA: Diagnosis not present

## 2021-03-02 DIAGNOSIS — J449 Chronic obstructive pulmonary disease, unspecified: Secondary | ICD-10-CM | POA: Diagnosis not present

## 2021-03-02 DIAGNOSIS — M4726 Other spondylosis with radiculopathy, lumbar region: Secondary | ICD-10-CM | POA: Diagnosis not present

## 2021-03-02 DIAGNOSIS — I2781 Cor pulmonale (chronic): Secondary | ICD-10-CM | POA: Diagnosis not present

## 2021-03-02 DIAGNOSIS — I110 Hypertensive heart disease with heart failure: Secondary | ICD-10-CM | POA: Diagnosis not present

## 2021-03-02 DIAGNOSIS — D649 Anemia, unspecified: Secondary | ICD-10-CM | POA: Diagnosis not present

## 2021-03-02 DIAGNOSIS — I5032 Chronic diastolic (congestive) heart failure: Secondary | ICD-10-CM | POA: Diagnosis not present

## 2021-03-08 DIAGNOSIS — I110 Hypertensive heart disease with heart failure: Secondary | ICD-10-CM | POA: Diagnosis not present

## 2021-03-08 DIAGNOSIS — G8929 Other chronic pain: Secondary | ICD-10-CM | POA: Diagnosis not present

## 2021-03-08 DIAGNOSIS — M4726 Other spondylosis with radiculopathy, lumbar region: Secondary | ICD-10-CM | POA: Diagnosis not present

## 2021-03-08 DIAGNOSIS — J449 Chronic obstructive pulmonary disease, unspecified: Secondary | ICD-10-CM | POA: Diagnosis not present

## 2021-03-08 DIAGNOSIS — I5032 Chronic diastolic (congestive) heart failure: Secondary | ICD-10-CM | POA: Diagnosis not present

## 2021-03-08 DIAGNOSIS — D649 Anemia, unspecified: Secondary | ICD-10-CM | POA: Diagnosis not present

## 2021-03-08 DIAGNOSIS — M5116 Intervertebral disc disorders with radiculopathy, lumbar region: Secondary | ICD-10-CM | POA: Diagnosis not present

## 2021-03-08 DIAGNOSIS — I4891 Unspecified atrial fibrillation: Secondary | ICD-10-CM | POA: Diagnosis not present

## 2021-03-08 DIAGNOSIS — I2781 Cor pulmonale (chronic): Secondary | ICD-10-CM | POA: Diagnosis not present

## 2021-03-15 DIAGNOSIS — I5032 Chronic diastolic (congestive) heart failure: Secondary | ICD-10-CM | POA: Diagnosis not present

## 2021-03-15 DIAGNOSIS — I4891 Unspecified atrial fibrillation: Secondary | ICD-10-CM | POA: Diagnosis not present

## 2021-03-15 DIAGNOSIS — M5116 Intervertebral disc disorders with radiculopathy, lumbar region: Secondary | ICD-10-CM | POA: Diagnosis not present

## 2021-03-15 DIAGNOSIS — G8929 Other chronic pain: Secondary | ICD-10-CM | POA: Diagnosis not present

## 2021-03-15 DIAGNOSIS — J449 Chronic obstructive pulmonary disease, unspecified: Secondary | ICD-10-CM | POA: Diagnosis not present

## 2021-03-15 DIAGNOSIS — D649 Anemia, unspecified: Secondary | ICD-10-CM | POA: Diagnosis not present

## 2021-03-15 DIAGNOSIS — I110 Hypertensive heart disease with heart failure: Secondary | ICD-10-CM | POA: Diagnosis not present

## 2021-03-15 DIAGNOSIS — I2781 Cor pulmonale (chronic): Secondary | ICD-10-CM | POA: Diagnosis not present

## 2021-03-15 DIAGNOSIS — M4726 Other spondylosis with radiculopathy, lumbar region: Secondary | ICD-10-CM | POA: Diagnosis not present

## 2021-03-20 DIAGNOSIS — I110 Hypertensive heart disease with heart failure: Secondary | ICD-10-CM | POA: Diagnosis not present

## 2021-03-20 DIAGNOSIS — M4726 Other spondylosis with radiculopathy, lumbar region: Secondary | ICD-10-CM | POA: Diagnosis not present

## 2021-03-20 DIAGNOSIS — I4891 Unspecified atrial fibrillation: Secondary | ICD-10-CM | POA: Diagnosis not present

## 2021-03-20 DIAGNOSIS — I5032 Chronic diastolic (congestive) heart failure: Secondary | ICD-10-CM | POA: Diagnosis not present

## 2021-03-20 DIAGNOSIS — M5116 Intervertebral disc disorders with radiculopathy, lumbar region: Secondary | ICD-10-CM | POA: Diagnosis not present

## 2021-03-20 DIAGNOSIS — J449 Chronic obstructive pulmonary disease, unspecified: Secondary | ICD-10-CM | POA: Diagnosis not present

## 2021-03-20 DIAGNOSIS — G8929 Other chronic pain: Secondary | ICD-10-CM | POA: Diagnosis not present

## 2021-03-20 DIAGNOSIS — D649 Anemia, unspecified: Secondary | ICD-10-CM | POA: Diagnosis not present

## 2021-03-20 DIAGNOSIS — I2781 Cor pulmonale (chronic): Secondary | ICD-10-CM | POA: Diagnosis not present

## 2021-03-21 DIAGNOSIS — J449 Chronic obstructive pulmonary disease, unspecified: Secondary | ICD-10-CM | POA: Diagnosis not present

## 2021-04-03 DIAGNOSIS — J449 Chronic obstructive pulmonary disease, unspecified: Secondary | ICD-10-CM | POA: Diagnosis not present

## 2021-04-03 DIAGNOSIS — J9610 Chronic respiratory failure, unspecified whether with hypoxia or hypercapnia: Secondary | ICD-10-CM | POA: Diagnosis not present

## 2021-04-20 DIAGNOSIS — J441 Chronic obstructive pulmonary disease with (acute) exacerbation: Secondary | ICD-10-CM | POA: Diagnosis not present

## 2021-04-20 DIAGNOSIS — I272 Pulmonary hypertension, unspecified: Secondary | ICD-10-CM | POA: Diagnosis not present

## 2021-04-21 DIAGNOSIS — J449 Chronic obstructive pulmonary disease, unspecified: Secondary | ICD-10-CM | POA: Diagnosis not present

## 2021-04-25 ENCOUNTER — Emergency Department: Payer: Medicare HMO

## 2021-04-25 ENCOUNTER — Other Ambulatory Visit: Payer: Self-pay

## 2021-04-25 ENCOUNTER — Inpatient Hospital Stay
Admission: EM | Admit: 2021-04-25 | Discharge: 2021-04-27 | DRG: 291 | Disposition: A | Discharge status: Home Health | Payer: Medicare HMO | Attending: Internal Medicine | Admitting: Internal Medicine

## 2021-04-25 ENCOUNTER — Inpatient Hospital Stay: Payer: Medicare HMO

## 2021-04-25 DIAGNOSIS — F32A Depression, unspecified: Secondary | ICD-10-CM | POA: Diagnosis present

## 2021-04-25 DIAGNOSIS — J9622 Acute and chronic respiratory failure with hypercapnia: Secondary | ICD-10-CM | POA: Diagnosis present

## 2021-04-25 DIAGNOSIS — N390 Urinary tract infection, site not specified: Secondary | ICD-10-CM | POA: Diagnosis not present

## 2021-04-25 DIAGNOSIS — Z7951 Long term (current) use of inhaled steroids: Secondary | ICD-10-CM | POA: Diagnosis not present

## 2021-04-25 DIAGNOSIS — R0602 Shortness of breath: Secondary | ICD-10-CM | POA: Diagnosis not present

## 2021-04-25 DIAGNOSIS — I5033 Acute on chronic diastolic (congestive) heart failure: Secondary | ICD-10-CM | POA: Diagnosis not present

## 2021-04-25 DIAGNOSIS — E662 Morbid (severe) obesity with alveolar hypoventilation: Secondary | ICD-10-CM | POA: Diagnosis not present

## 2021-04-25 DIAGNOSIS — E66813 Obesity, class 3: Secondary | ICD-10-CM | POA: Diagnosis present

## 2021-04-25 DIAGNOSIS — E785 Hyperlipidemia, unspecified: Secondary | ICD-10-CM | POA: Diagnosis present

## 2021-04-25 DIAGNOSIS — Z79899 Other long term (current) drug therapy: Secondary | ICD-10-CM

## 2021-04-25 DIAGNOSIS — R062 Wheezing: Secondary | ICD-10-CM | POA: Diagnosis not present

## 2021-04-25 DIAGNOSIS — Z9981 Dependence on supplemental oxygen: Secondary | ICD-10-CM | POA: Diagnosis not present

## 2021-04-25 DIAGNOSIS — J811 Chronic pulmonary edema: Secondary | ICD-10-CM

## 2021-04-25 DIAGNOSIS — Z6841 Body Mass Index (BMI) 40.0 and over, adult: Secondary | ICD-10-CM

## 2021-04-25 DIAGNOSIS — I272 Pulmonary hypertension, unspecified: Secondary | ICD-10-CM | POA: Diagnosis present

## 2021-04-25 DIAGNOSIS — I517 Cardiomegaly: Secondary | ICD-10-CM | POA: Diagnosis not present

## 2021-04-25 DIAGNOSIS — I2510 Atherosclerotic heart disease of native coronary artery without angina pectoris: Secondary | ICD-10-CM | POA: Diagnosis present

## 2021-04-25 DIAGNOSIS — I110 Hypertensive heart disease with heart failure: Principal | ICD-10-CM | POA: Diagnosis present

## 2021-04-25 DIAGNOSIS — J9621 Acute and chronic respiratory failure with hypoxia: Secondary | ICD-10-CM | POA: Diagnosis present

## 2021-04-25 DIAGNOSIS — J441 Chronic obstructive pulmonary disease with (acute) exacerbation: Secondary | ICD-10-CM | POA: Diagnosis present

## 2021-04-25 DIAGNOSIS — G2581 Restless legs syndrome: Secondary | ICD-10-CM | POA: Diagnosis present

## 2021-04-25 DIAGNOSIS — Z7901 Long term (current) use of anticoagulants: Secondary | ICD-10-CM

## 2021-04-25 DIAGNOSIS — E872 Acidosis: Secondary | ICD-10-CM | POA: Diagnosis not present

## 2021-04-25 DIAGNOSIS — I5032 Chronic diastolic (congestive) heart failure: Secondary | ICD-10-CM

## 2021-04-25 DIAGNOSIS — B9620 Unspecified Escherichia coli [E. coli] as the cause of diseases classified elsewhere: Secondary | ICD-10-CM | POA: Diagnosis present

## 2021-04-25 DIAGNOSIS — Z885 Allergy status to narcotic agent status: Secondary | ICD-10-CM | POA: Diagnosis not present

## 2021-04-25 DIAGNOSIS — R069 Unspecified abnormalities of breathing: Secondary | ICD-10-CM | POA: Diagnosis not present

## 2021-04-25 DIAGNOSIS — I509 Heart failure, unspecified: Secondary | ICD-10-CM

## 2021-04-25 DIAGNOSIS — J9811 Atelectasis: Secondary | ICD-10-CM | POA: Diagnosis not present

## 2021-04-25 DIAGNOSIS — E8729 Other acidosis: Secondary | ICD-10-CM

## 2021-04-25 DIAGNOSIS — J9601 Acute respiratory failure with hypoxia: Secondary | ICD-10-CM

## 2021-04-25 DIAGNOSIS — R4182 Altered mental status, unspecified: Secondary | ICD-10-CM | POA: Diagnosis not present

## 2021-04-25 DIAGNOSIS — Z20822 Contact with and (suspected) exposure to covid-19: Secondary | ICD-10-CM | POA: Diagnosis present

## 2021-04-25 DIAGNOSIS — Z87891 Personal history of nicotine dependence: Secondary | ICD-10-CM | POA: Diagnosis not present

## 2021-04-25 DIAGNOSIS — Z882 Allergy status to sulfonamides status: Secondary | ICD-10-CM | POA: Diagnosis not present

## 2021-04-25 DIAGNOSIS — I6782 Cerebral ischemia: Secondary | ICD-10-CM | POA: Diagnosis not present

## 2021-04-25 DIAGNOSIS — J9620 Acute and chronic respiratory failure, unspecified whether with hypoxia or hypercapnia: Secondary | ICD-10-CM | POA: Insufficient documentation

## 2021-04-25 DIAGNOSIS — I480 Paroxysmal atrial fibrillation: Secondary | ICD-10-CM | POA: Diagnosis not present

## 2021-04-25 DIAGNOSIS — R0689 Other abnormalities of breathing: Secondary | ICD-10-CM | POA: Diagnosis not present

## 2021-04-25 LAB — COMPREHENSIVE METABOLIC PANEL WITH GFR
ALT: 9 U/L (ref 0–44)
AST: 10 U/L — ABNORMAL LOW (ref 15–41)
Albumin: 3.5 g/dL (ref 3.5–5.0)
Alkaline Phosphatase: 108 U/L (ref 38–126)
Anion gap: 9 (ref 5–15)
BUN: 17 mg/dL (ref 8–23)
CO2: 40 mmol/L — ABNORMAL HIGH (ref 22–32)
Calcium: 9.1 mg/dL (ref 8.9–10.3)
Chloride: 88 mmol/L — ABNORMAL LOW (ref 98–111)
Creatinine, Ser: 1.11 mg/dL — ABNORMAL HIGH (ref 0.44–1.00)
GFR, Estimated: 54 mL/min — ABNORMAL LOW (ref >60)
Glucose, Bld: 169 mg/dL — ABNORMAL HIGH (ref 70–99)
Potassium: 5 mmol/L (ref 3.5–5.1)
Sodium: 137 mmol/L (ref 135–145)
Total Bilirubin: 0.6 mg/dL (ref 0.3–1.2)
Total Protein: 6.9 g/dL (ref 6.5–8.1)

## 2021-04-25 LAB — BLOOD GAS, ARTERIAL
Acid-Base Excess: 14.9 mmol/L — ABNORMAL HIGH (ref 0.0–2.0)
Acid-Base Excess: 18.2 mmol/L — ABNORMAL HIGH (ref 0.0–2.0)
Allens test (pass/fail): POSITIVE — AB
Bicarbonate: 45.8 mmol/L — ABNORMAL HIGH (ref 20.0–28.0)
Bicarbonate: 48 mmol/L — ABNORMAL HIGH (ref 20.0–28.0)
Delivery systems: POSITIVE
Expiratory PAP: 5
FIO2: 0.5
Inspiratory PAP: 14
O2 Saturation: 96.1 %
O2 Saturation: 96.8 %
PEEP: 5 cmH2O
Patient temperature: 37
Patient temperature: 37
RATE: 8 {breaths}/min
pCO2 arterial: 102 mmHg (ref 32.0–48.0)
pCO2 arterial: 91 mmHg (ref 32.0–48.0)
pH, Arterial: 7.26 — ABNORMAL LOW (ref 7.350–7.450)
pH, Arterial: 7.33 — ABNORMAL LOW (ref 7.350–7.450)
pO2, Arterial: 94 mmHg (ref 83.0–108.0)
pO2, Arterial: 94 mmHg (ref 83.0–108.0)

## 2021-04-25 LAB — URINALYSIS, COMPLETE (UACMP) WITH MICROSCOPIC
Bilirubin Urine: NEGATIVE
Glucose, UA: NEGATIVE mg/dL
Ketones, ur: NEGATIVE mg/dL
Nitrite: POSITIVE — AB
Protein, ur: NEGATIVE mg/dL
RBC / HPF: >50 RBC/hpf — ABNORMAL HIGH (ref 0–5)
Specific Gravity, Urine: 1.017 (ref 1.005–1.030)
pH: 5 (ref 5.0–8.0)

## 2021-04-25 LAB — URINE DRUG SCREEN, QUALITATIVE (ARMC ONLY)
Amphetamines, Ur Screen: NOT DETECTED
Barbiturates, Ur Screen: NOT DETECTED
Benzodiazepine, Ur Scrn: NOT DETECTED
Cannabinoid 50 Ng, Ur ~~LOC~~: NOT DETECTED
Cocaine Metabolite,Ur ~~LOC~~: NOT DETECTED
MDMA (Ecstasy)Ur Screen: NOT DETECTED
Methadone Scn, Ur: NOT DETECTED
Opiate, Ur Screen: NOT DETECTED
Phencyclidine (PCP) Ur S: NOT DETECTED
Tricyclic, Ur Screen: NOT DETECTED

## 2021-04-25 LAB — CBC WITH DIFFERENTIAL/PLATELET
Abs Immature Granulocytes: 0.13 K/uL — ABNORMAL HIGH (ref 0.00–0.07)
Basophils Absolute: 0.1 K/uL (ref 0.0–0.1)
Basophils Relative: 1 %
Eosinophils Absolute: 0.4 K/uL (ref 0.0–0.5)
Eosinophils Relative: 2 %
HCT: 36.8 % (ref 36.0–46.0)
Hemoglobin: 10.9 g/dL — ABNORMAL LOW (ref 12.0–15.0)
Immature Granulocytes: 1 %
Lymphocytes Relative: 5 %
Lymphs Abs: 1 K/uL (ref 0.7–4.0)
MCH: 26.8 pg (ref 26.0–34.0)
MCHC: 29.6 g/dL — ABNORMAL LOW (ref 30.0–36.0)
MCV: 90.6 fL (ref 80.0–100.0)
Monocytes Absolute: 0.6 K/uL (ref 0.1–1.0)
Monocytes Relative: 3 %
Neutro Abs: 16.6 K/uL — ABNORMAL HIGH (ref 1.7–7.7)
Neutrophils Relative %: 88 %
Platelets: 360 K/uL (ref 150–400)
RBC: 4.06 MIL/uL (ref 3.87–5.11)
RDW: 16 % — ABNORMAL HIGH (ref 11.5–15.5)
WBC: 18.8 K/uL — ABNORMAL HIGH (ref 4.0–10.5)
nRBC: 0 % (ref 0.0–0.2)

## 2021-04-25 LAB — RESP PANEL BY RT-PCR (FLU A&B, COVID) ARPGX2
Influenza A by PCR: NEGATIVE
Influenza B by PCR: NEGATIVE
SARS Coronavirus 2 by RT PCR: NEGATIVE

## 2021-04-25 LAB — HIV ANTIBODY (ROUTINE TESTING W REFLEX): HIV Screen 4th Generation wRfx: NONREACTIVE

## 2021-04-25 LAB — CBG MONITORING, ED: Glucose-Capillary: 161 mg/dL — ABNORMAL HIGH (ref 70–99)

## 2021-04-25 LAB — PROCALCITONIN: Procalcitonin: <0.1 ng/mL

## 2021-04-25 LAB — ETHANOL: Alcohol, Ethyl (B): <10 mg/dL (ref <10)

## 2021-04-25 LAB — AMMONIA: Ammonia: 28 umol/L (ref 9–35)

## 2021-04-25 LAB — BRAIN NATRIURETIC PEPTIDE: B Natriuretic Peptide: 99.7 pg/mL (ref 0.0–100.0)

## 2021-04-25 LAB — TROPONIN I (HIGH SENSITIVITY)
Troponin I (High Sensitivity): 12 ng/L (ref <18)
Troponin I (High Sensitivity): 17 ng/L (ref <18)

## 2021-04-25 MED ORDER — ATORVASTATIN CALCIUM 20 MG PO TABS
20.0000 mg | ORAL_TABLET | Freq: Every day | ORAL | Status: DC
  Administered 2021-04-26 – 2021-04-27 (×2): 20 mg via ORAL
  Filled 2021-04-25 (×2): qty 1

## 2021-04-25 MED ORDER — PANTOPRAZOLE SODIUM 40 MG PO TBEC
40.0000 mg | DELAYED_RELEASE_TABLET | Freq: Every day | ORAL | Status: DC
  Administered 2021-04-26 – 2021-04-27 (×2): 40 mg via ORAL
  Filled 2021-04-25 (×2): qty 1

## 2021-04-25 MED ORDER — ACETAMINOPHEN 650 MG RE SUPP: 650.0000 mg | Freq: Four times a day (QID) | RECTAL | Status: DC | PRN

## 2021-04-25 MED ORDER — FAMOTIDINE 20 MG PO TABS: 20.0000 mg | ORAL_TABLET | Freq: Every day | ORAL | Status: DC

## 2021-04-25 MED ORDER — FUROSEMIDE 10 MG/ML IJ SOLN
40.0000 mg | Freq: Once | INTRAMUSCULAR | Status: AC
  Administered 2021-04-25: 40 mg via INTRAVENOUS
  Filled 2021-04-25: qty 4

## 2021-04-25 MED ORDER — FLUTICASONE-UMECLIDIN-VILANT 100-62.5-25 MCG/INH IN AEPB: 1.0000 | INHALATION_SPRAY | Freq: Every day | RESPIRATORY_TRACT | Status: DC

## 2021-04-25 MED ORDER — ONDANSETRON HCL 4 MG PO TABS: 4.0000 mg | ORAL_TABLET | Freq: Four times a day (QID) | ORAL | Status: DC | PRN

## 2021-04-25 MED ORDER — IPRATROPIUM-ALBUTEROL 0.5-2.5 (3) MG/3ML IN SOLN
3.0000 mL | Freq: Four times a day (QID) | RESPIRATORY_TRACT | Status: DC | PRN
  Administered 2021-04-26: 3 mL via RESPIRATORY_TRACT
  Filled 2021-04-25: qty 3

## 2021-04-25 MED ORDER — UMECLIDINIUM BROMIDE 62.5 MCG/INH IN AEPB
1.0000 | INHALATION_SPRAY | Freq: Every day | RESPIRATORY_TRACT | Status: DC
  Administered 2021-04-26 – 2021-04-27 (×2): 1 via RESPIRATORY_TRACT
  Filled 2021-04-25: qty 7

## 2021-04-25 MED ORDER — SODIUM CHLORIDE 0.9 % IV SOLN
2.0000 g | Freq: Once | INTRAVENOUS | Status: AC
  Administered 2021-04-25: 2 g via INTRAVENOUS
  Filled 2021-04-25: qty 20

## 2021-04-25 MED ORDER — ESCITALOPRAM OXALATE 20 MG PO TABS
20.0000 mg | ORAL_TABLET | Freq: Every day | ORAL | Status: DC
  Administered 2021-04-26 – 2021-04-27 (×2): 20 mg via ORAL
  Filled 2021-04-25 (×3): qty 1

## 2021-04-25 MED ORDER — BISOPROLOL FUMARATE 5 MG PO TABS
5.0000 mg | ORAL_TABLET | Freq: Every day | ORAL | Status: DC
  Administered 2021-04-26 – 2021-04-27 (×2): 5 mg via ORAL
  Filled 2021-04-25 (×2): qty 1

## 2021-04-25 MED ORDER — SPIRONOLACTONE 25 MG PO TABS
25.0000 mg | ORAL_TABLET | Freq: Every day | ORAL | Status: DC
  Administered 2021-04-26 – 2021-04-27 (×2): 25 mg via ORAL
  Filled 2021-04-25 (×2): qty 1

## 2021-04-25 MED ORDER — METHYLPREDNISOLONE SODIUM SUCC 125 MG IJ SOLR
125.0000 mg | Freq: Once | INTRAMUSCULAR | Status: AC
  Administered 2021-04-25: 125 mg via INTRAVENOUS
  Filled 2021-04-25: qty 2

## 2021-04-25 MED ORDER — MONTELUKAST SODIUM 10 MG PO TABS
10.0000 mg | ORAL_TABLET | Freq: Every day | ORAL | Status: DC
  Administered 2021-04-26: 10 mg via ORAL
  Filled 2021-04-25: qty 1

## 2021-04-25 MED ORDER — FUROSEMIDE 10 MG/ML IJ SOLN
40.0000 mg | Freq: Two times a day (BID) | INTRAMUSCULAR | Status: DC
  Administered 2021-04-25 – 2021-04-27 (×4): 40 mg via INTRAVENOUS
  Filled 2021-04-25 (×4): qty 4

## 2021-04-25 MED ORDER — PREDNISONE 20 MG PO TABS
40.0000 mg | ORAL_TABLET | Freq: Every day | ORAL | Status: DC
  Administered 2021-04-26 – 2021-04-27 (×2): 40 mg via ORAL
  Filled 2021-04-25 (×2): qty 2

## 2021-04-25 MED ORDER — FLUTICASONE FUROATE-VILANTEROL 100-25 MCG/INH IN AEPB
1.0000 | INHALATION_SPRAY | Freq: Every day | RESPIRATORY_TRACT | Status: DC
  Administered 2021-04-26 – 2021-04-27 (×2): 1 via RESPIRATORY_TRACT
  Filled 2021-04-25: qty 28

## 2021-04-25 MED ORDER — APIXABAN 5 MG PO TABS
5.0000 mg | ORAL_TABLET | Freq: Two times a day (BID) | ORAL | Status: DC
  Administered 2021-04-26 – 2021-04-27 (×3): 5 mg via ORAL
  Filled 2021-04-25 (×3): qty 1

## 2021-04-25 MED ORDER — MAGNESIUM SULFATE 2 GM/50ML IV SOLN
2.0000 g | Freq: Once | INTRAVENOUS | Status: AC
  Administered 2021-04-25: 2 g via INTRAVENOUS
  Filled 2021-04-25: qty 50

## 2021-04-25 MED ORDER — IPRATROPIUM-ALBUTEROL 0.5-2.5 (3) MG/3ML IN SOLN
3.0000 mL | Freq: Once | RESPIRATORY_TRACT | Status: AC
  Administered 2021-04-25: 3 mL via RESPIRATORY_TRACT
  Filled 2021-04-25: qty 3

## 2021-04-25 MED ORDER — SODIUM CHLORIDE 0.9% FLUSH: 3.0000 mL | INTRAVENOUS | Status: DC | PRN

## 2021-04-25 MED ORDER — FUROSEMIDE 40 MG PO TABS: 40.0000 mg | ORAL_TABLET | Freq: Two times a day (BID) | ORAL | Status: DC

## 2021-04-25 MED ORDER — ACETAMINOPHEN 325 MG PO TABS: 650.0000 mg | ORAL_TABLET | Freq: Four times a day (QID) | ORAL | Status: DC | PRN

## 2021-04-25 MED ORDER — SODIUM CHLORIDE 0.9 % IV SOLN: 250.0000 mL | INTRAVENOUS | Status: DC | PRN

## 2021-04-25 MED ORDER — SODIUM CHLORIDE 0.9 % IV SOLN
1.0000 g | INTRAVENOUS | Status: DC
  Administered 2021-04-26: 1 g via INTRAVENOUS
  Filled 2021-04-25 (×2): qty 10

## 2021-04-25 MED ORDER — METHYLPREDNISOLONE SODIUM SUCC 40 MG IJ SOLR
40.0000 mg | Freq: Two times a day (BID) | INTRAMUSCULAR | Status: AC
  Administered 2021-04-25 – 2021-04-26 (×2): 40 mg via INTRAVENOUS
  Filled 2021-04-25 (×2): qty 1

## 2021-04-25 MED ORDER — SODIUM CHLORIDE 0.9% FLUSH
3.0000 mL | Freq: Two times a day (BID) | INTRAVENOUS | Status: DC
  Administered 2021-04-25 – 2021-04-27 (×4): 3 mL via INTRAVENOUS

## 2021-04-25 MED ORDER — FERROUS GLUCONATE 324 (38 FE) MG PO TABS
324.0000 mg | ORAL_TABLET | Freq: Every morning | ORAL | Status: DC
  Administered 2021-04-26 – 2021-04-27 (×2): 324 mg via ORAL
  Filled 2021-04-25 (×3): qty 1

## 2021-04-25 MED ORDER — IOHEXOL 350 MG/ML SOLN
100.0000 mL | Freq: Once | INTRAVENOUS | Status: AC | PRN
  Administered 2021-04-25: 100 mL via INTRAVENOUS

## 2021-04-25 MED ORDER — ONDANSETRON HCL 4 MG/2ML IJ SOLN: 4.0000 mg | Freq: Four times a day (QID) | INTRAMUSCULAR | Status: DC | PRN

## 2021-04-25 MED ORDER — FUROSEMIDE 10 MG/ML IJ SOLN: 40.0000 mg | Freq: Two times a day (BID) | INTRAMUSCULAR | Status: DC

## 2021-04-25 NOTE — ED Notes (Signed)
Taken to CT with RT and RN.

## 2021-04-25 NOTE — ED Triage Notes (Addendum)
BIB [facility] from home due to SOB. Wheezing noted, oxygen in 80's on RA. Placed on 15L NRB, 98%. 0/10 pain. EKG unremarkable, BP WDL-lives at the home with daughter. Has been intermittently cooperative; responsive to both verbal and physical stimuli.

## 2021-04-25 NOTE — ED Notes (Addendum)
BIPAP placed on patient.

## 2021-04-25 NOTE — ED Notes (Addendum)
EDP at bedside. Pt opens eyes when asked; nods yes to wearing oxygen at home. Pt denies pain.

## 2021-04-25 NOTE — Progress Notes (Signed)
Patient arrived on unit via stretcher. Patient oriented to room. Noted tremors to bilateral arms. MD is aware.

## 2021-04-25 NOTE — ED Notes (Signed)
Back from CT

## 2021-04-25 NOTE — ED Notes (Signed)
Daughter informed of pt status and going to floor at this time room 244

## 2021-04-25 NOTE — ED Notes (Signed)
purewick placed on patient.

## 2021-04-25 NOTE — ED Notes (Signed)
Pharmacy messaged regarding adjusting times of meds due to pt status and on BIPAP

## 2021-04-25 NOTE — ED Notes (Signed)
Pt checked and dry at  this time external cath still in place. Pt transported with EDT Gabby and RT to 244

## 2021-04-25 NOTE — ED Notes (Signed)
Pt had saturated linens. Pt cleaned up and clean brief, chux, puriwick and gown. Pt pulled up in bed. Pt resting comfortably at this time.

## 2021-04-25 NOTE — ED Notes (Addendum)
Top and bottom dentures placed at bedside with daughter.

## 2021-04-25 NOTE — ED Notes (Signed)
Pharmacy messaged regarding changing times of pt's medications due to pt's status at this time. ADmit MD aware and approves of this change.

## 2021-04-25 NOTE — H&P (Signed)
History and Physical    Paula Torres PYP:[id number] DOB: 12-16-1950 DOA: 04/25/2021  PCP: Rusty Aus, MD   Patient coming from: Home  I have personally briefly reviewed patient's old medical records in [place]  Chief Complaint: Home  Most of the history was obtained from patient's daughter who was at the bedside.  HPI: Paula Torres is a 70 y.o. Caucasian female with medical history significant for morbid obesity (BMI 44.39 kg/m), chronic diastolic CHF, COPD with chronic respiratory failure on 3 - 4 L oxygen continuous/trilogy at night, hypertension, atrial fibrillation and severe pulmonary hypertension who presented to the emergency room via EMS for evaluation of worsening shortness of breath from her baseline, wheezing and hypoxia.  According to her daughter who provided most of the history, she states that her mother was in her usual state of health until last night when she texted her and told that she had trouble breathing and needed help.  She states that on 3 L of oxygen patient's pulse oximetry was in the 60s and so she had to turn up her mother's oxygen to improve her pulse oximetry to 92%.  She states that every time she tried to turn down the oxygen her pulse oximetry would drop and so she called EMS and gave her mother prednisone as recommended for respiratory distress.  She also used her nebulizer prior to EMS arriving without any significant improvement. She notes that her mother had wheezing but states she did not notice a cough and that her leg swelling had improved compared to prior. Patient is on a BiPAP and arouses to painful stimuli. I am unable to do review of systems at this time. ABG 7.33/91/94/48/96 on Bipap Labs show sodium 137, potassium 5.0, chloride 88, bicarb 40, glucose 169, BUN 17, creatinine 1.1, calcium 9.9, alkaline phosphatase 108, albumin 3.5, AST 10, ALT 9, total protein 6.9, ammonia 28, BNP 99.7, procalcitonin less than 0.10, white count  18.8, hemoglobin 10.9, hematocrit 36.8, MCV 90.6, RDW 16, platelet count 360 Respiratory viral panel is negative Chest x-ray reviewed by me shows cardiomegaly and increased pulmonary interstitial opacity appears stable from last year. CT scan of the head without contrast shows no acute intracranial abnormalities.  Chronic microvascular ischemic changes in the cerebral white matter. Twelve-lead EKG reviewed by me shows sinus rhythm with low voltage QRS.      ED Course: Patient is a 70 year old morbidly obese female with a history of COPD with chronic respiratory failure on 3 to 4 L of oxygen continuous during the day and trilogy at night who presents to the ER via EMS for evaluation of respiratory distress and hypoxia requiring BiPAP to reduce work of breathing. Her initial arterial blood gas showed uncompensated respiratory acidosis which has improved since patient has been on BiPAP. She will be admitted to the hospital for further evaluation.    Review of Systems: As per HPI otherwise all other systems reviewed and negative.    Past Medical History:  Diagnosis Date   (HFpEF) heart failure with preserved ejection fraction (HCC)    Chronic respiratory failure with hypoxia (HCC)    COPD (chronic obstructive pulmonary disease) (HCC)    Coronary artery disease, non-occlusive    Hypertension    OSA (obstructive sleep apnea)    PAF (paroxysmal atrial fibrillation) ([place])    Pulmonary hypertension ([place])     Past Surgical History:  Procedure Laterality Date   NO PAST SURGERIES     RIGHT/LEFT HEART CATH AND CORONARY ANGIOGRAPHY  Left 12/21/2019   Procedure: RIGHT/LEFT HEART CATH AND CORONARY ANGIOGRAPHY;  Surgeon: Wellington Hampshire, MD;  Location: [facility];  Service: Cardiovascular;  Laterality: Left;     reports that she quit smoking about 3 years ago. Her smoking use included cigarettes. She has never used smokeless tobacco. She reports that she does not currently use alcohol.  She reports that she does not use drugs.  Allergies  Allergen Reactions   Codeine     Back Headache   Sulfa Antibiotics     Reported to pt from mother who is no longer living -  Details unknown    Family History  Problem Relation Age of Onset   Hypertension Other       Prior to Admission medications   Medication Sig Start Date End Date Taking? Authorizing Provider  albuterol (VENTOLIN HFA) 108 (90 Base) MCG/ACT inhaler Inhale 2 puffs into the lungs every 6 (six) hours as needed for wheezing or shortness of breath.    [provider]  atorvastatin (LIPITOR) 20 MG tablet Take 1 tablet (20 mg total) by mouth daily at 6 PM. 11/23/19   Loel Dubonnet, NP  bisoprolol (ZEBETA) 5 MG tablet Take 5 mg by mouth daily. 03/09/20   [provider]  ELIQUIS 5 MG TABS tablet TAKE 1 TABLET(5 MG) BY MOUTH TWICE DAILY 05/30/20   Rise Mu, PA-C  escitalopram (LEXAPRO) 20 MG tablet Take 20 mg by mouth daily.    [provider]  famotidine (PEPCID) 20 MG tablet Take 20 mg by mouth daily.    [provider]  ferrous gluconate (FERGON) 324 MG tablet Take 324 mg by mouth every morning. 05/04/20   [provider]  fluticasone (FLONASE) 50 MCG/ACT nasal spray Place 2 sprays into both nostrils daily. 05/13/20 06/12/20  Nicole Kindred A, DO  furosemide (LASIX) 40 MG tablet Take 1 tablet (40 mg total) by mouth daily. Patient taking differently: Take 80 mg by mouth daily.  11/23/19   Loel Dubonnet, NP  guaiFENesin (MUCINEX) 600 MG 12 hr tablet Take 600 mg by mouth daily.    [provider]  HYDROcodone-acetaminophen (NORCO/VICODIN) 5-325 MG tablet Take 1 tablet by mouth every 8 (eight) hours as needed. 04/11/20   [provider]  ipratropium (ATROVENT) 0.06 % nasal spray Place 2 sprays into both nostrils 3 (three) times daily as needed. 02/12/20   [provider]  Ipratropium-Albuterol (COMBIVENT RESPIMAT) 20-100 MCG/ACT AERS respimat Inhale 1  puff into the lungs every 6 (six) hours as needed (wheezing/shortness of breath.).     [provider]  ipratropium-albuterol (DUONEB) 0.5-2.5 (3) MG/3ML SOLN Take 3 mLs by nebulization every 6 (six) hours as needed (for wheezing/shortness of breath.).     [provider]  loratadine (CLARITIN) 10 MG tablet Take 1 tablet (10 mg total) by mouth daily. 05/13/20   Nicole Kindred A, DO  montelukast (SINGULAIR) 10 MG tablet Take 10 mg by mouth at bedtime.    [provider]  spironolactone (ALDACTONE) 25 MG tablet Take 1 tablet (25 mg total) by mouth daily. Patient taking differently: Take 50 mg by mouth daily.  11/23/19   Loel Dubonnet, NP  TRELEGY ELLIPTA 100-62.5-25 MCG/INH AEPB Inhale 1 puff into the lungs daily. 11/20/19   [provider]    Physical Exam: Vitals:   04/25/21 0815 04/25/21 0830 04/25/21 0845 04/25/21 0920  BP:  119/66    Pulse: 64 64 69   Resp: Marland Kitchen)  21 15 (!) 21   Temp:      TempSrc:      SpO2: 96% 97% 98% 94%  Weight:      Height:         Vitals:   04/25/21 0815 04/25/21 0830 04/25/21 0845 04/25/21 0920  BP:  119/66    Pulse: 64 64 69   Resp: (!) 21 15 (!) 21   Temp:      TempSrc:      SpO2: 96% 97% 98% 94%  Weight:      Height:          Constitutional: Lethargic on BiPAP.  Arouses to verbal stimuli.  Morbidly obese HEENT:      Head: Normocephalic and atraumatic.         Eyes: PERLA, EOMI, Conjunctivae are normal. Sclera is non-icteric.       Mouth/Throat: Mucous membranes are moist.       Neck: Supple with no signs of meningismus. Cardiovascular: Regular rate and rhythm. No murmurs, gallops, or rubs. 2+ symmetrical distal pulses are present . No JVD.  2+ LE edema Respiratory: Respiratory effort normal .  Bilateral air entry.  Scattered wheezes, faint crackles Gastrointestinal: Soft, non tender, and non distended with positive bowel sounds.  Genitourinary: No CVA tenderness. Musculoskeletal: Nontender with normal  range of motion in all extremities. No cyanosis, or erythema of extremities. Neurologic: Unable to assess Skin: Skin is warm, dry.  No rash or ulcers Psychiatric: Unable to assess   Labs on Admission: I have personally reviewed following labs and imaging studies  CBC: Recent Labs  Lab 04/25/21 0515  WBC 18.8*  NEUTROABS 16.6*  HGB 10.9*  HCT 36.8  MCV 90.6  PLT 735   Basic Metabolic Panel: Recent Labs  Lab 04/25/21 0515  NA 137  K 5.0  CL 88*  CO2 40*  GLUCOSE 169*  BUN 17  CREATININE 1.11*  CALCIUM 9.1   GFR: Estimated Creatinine Clearance: 62.4 mL/min (A) (by C-G formula based on SCr of 1.11 mg/dL (H)). Liver Function Tests: Recent Labs  Lab 04/25/21 0515  AST 10*  ALT 9  ALKPHOS 108  BILITOT 0.6  PROT 6.9  ALBUMIN 3.5   No results for input(s): LIPASE, AMYLASE in the last 168 hours. Recent Labs  Lab 04/25/21 0515  AMMONIA 28   Coagulation Profile: No results for input(s): INR, PROTIME in the last 168 hours. Cardiac Enzymes: No results for input(s): CKTOTAL, CKMB, CKMBINDEX, TROPONINI in the last 168 hours. BNP (last 3 results) No results for input(s): PROBNP in the last 8760 hours. HbA1C: No results for input(s): HGBA1C in the last 72 hours. CBG: Recent Labs  Lab 04/25/21 0533  GLUCAP 161*   Lipid Profile: No results for input(s): CHOL, HDL, LDLCALC, TRIG, CHOLHDL, LDLDIRECT in the last 72 hours. Thyroid Function Tests: No results for input(s): TSH, T4TOTAL, FREET4, T3FREE, THYROIDAB in the last 72 hours. Anemia Panel: No results for input(s): VITAMINB12, FOLATE, FERRITIN, TIBC, IRON, RETICCTPCT in the last 72 hours. Urine analysis:    Component Value Date/Time   COLORURINE YELLOW (A) 04/25/2021 0523   APPEARANCEUR HAZY (A) 04/25/2021 0523   LABSPEC 1.017 04/25/2021 0523   PHURINE 5.0 04/25/2021 0523   GLUCOSEU NEGATIVE 04/25/2021 0523   HGBUR MODERATE (A) 04/25/2021 0523   BILIRUBINUR NEGATIVE 04/25/2021 0523   KETONESUR NEGATIVE  04/25/2021 0523   PROTEINUR NEGATIVE 04/25/2021 0523   NITRITE POSITIVE (A) 04/25/2021 0523   LEUKOCYTESUR TRACE (A) 04/25/2021 3299  Radiological Exams on Admission: CT HEAD WO CONTRAST (5MM)  Result Date: 04/25/2021 CLINICAL DATA:  70 year old female with history of mental status change. Shortness of breath. EXAM: CT HEAD WITHOUT CONTRAST TECHNIQUE: Contiguous axial images were obtained from the base of the skull through the vertex without intravenous contrast. COMPARISON:  None. FINDINGS: Brain: Patchy and confluent areas of decreased attenuation are noted throughout the deep and periventricular white matter of the cerebral hemispheres bilaterally, compatible with chronic microvascular ischemic disease. No evidence of acute infarction, hemorrhage, hydrocephalus, extra-axial collection or mass lesion/mass effect. Vascular: No hyperdense vessel or unexpected calcification. Skull: Normal. Negative for fracture or focal lesion. Sinuses/Orbits: No acute finding. Other: None. IMPRESSION: 1. No acute intracranial abnormalities. 2. Chronic microvascular ischemic changes in the cerebral white matter, as above. Electronically Signed   By: Vinnie Langton M.D.   On: 04/25/2021 06:53   DG Chest Portable 1 View  Result Date: 04/25/2021 CLINICAL DATA:  70 year old female with shortness of breath, wheezing, pulse ox in the 80s on room air. EXAM: PORTABLE CHEST 1 VIEW COMPARISON:  CT chest 06/15/2020 and earlier. FINDINGS: Portable AP upright view at 0534 hours. Stable cardiomegaly and mediastinal contours. Stable lung volumes. Visualized tracheal air column is within normal limits. Chronic increased pulmonary interstitial opacity, not significantly changed from several portable radiographs last year. No pneumothorax, pleural effusion or consolidation. Chronic right lateral rib fractures. No acute osseous abnormality identified. Paucity of bowel gas in the upper abdomen. IMPRESSION: Cardiomegaly and increased  pulmonary interstitial opacity appears stable from last year. But consider acute interstitial edema. Electronically Signed   By: Genevie Ann M.D.   On: 04/25/2021 05:58     Assessment/Plan Principal Problem:   Acute on chronic respiratory failure with hypoxia and hypercapnia (HCC) Active Problems:   COPD with acute exacerbation (HCC)   Acute on chronic diastolic CHF (congestive heart failure) (HCC)   Obesity, Class III, BMI 40-49.9 (morbid obesity) (HCC)   Depression   AF (paroxysmal atrial fibrillation) (HCC)       Acute on chronic respiratory failure with hypoxia and hypercapnia Most likely secondary to COPD and CHF exacerbation At baseline patient wears 3 to 4 L of oxygen continuously uses trilogy at night Continue BiPAP Will attempt to wean patient off BiPAP once her acute illness improves/resolves    Acute diastolic dysfunction CHF Last 2D echo  revealed an LV EF of 60 - 65% with indeterminate diastolic left ventricular parameters at that time.   Place patient on Lasix 40 mg BID Continue spironolactone and bisoprolol     Acute COPD exacerbation Place patient on scheduled and as needed bronchodilator therapy Place patient on systemic steroids      Paroxysmal atrial fibrillation Continue bisoprolol for rate control.   Continue Eliquis as primary prophylaxis for acute stroke      Depression. Continue Lexapro.     Morbid Obesity (BMI 44) Complicates overall prognosis and care       DVT prophylaxis: Eliquis  Code Status: full code  Family Communication: Greater than 50% of time was spent discussing patient's condition and plan of care and her daughter at the bedside.  All questions and concerns have been addressed.  She verbalizes understanding and agrees with the plan. Disposition Plan: Back to previous home environment Consults called: Pulmonology Status: At the time of admission, it appears that the appropriate admission status for this patient is  inpatient. This is judged to be reasonable and necessary in order to provide the required intensity of service  to ensure the patient's safety given the presenting symptoms, physical exam findings, and initial radiographic and laboratory data in the context of their comorbid conditions. Patient requires inpatient status due to high intensity of service, high risk for further deterioration and high frequency of surveillance required.    Collier Bullock MD [facility]     04/25/2021, 9:41 AM

## 2021-04-25 NOTE — Consult Note (Signed)
Pulmonary Medicine          Date: 04/25/2021,   MRN# [id number] Paula Torres 03-08-1951     AdmissionWeight: 121 kg                 CurrentWeight: 121 kg   Referring physician: Dr. Francine Graven   CHIEF COMPLAINT:   Acute on chronic hypercapnic and hypoxemic respiratory failure   HISTORY OF PRESENT ILLNESS   Is a 70 year old female with a history of diastolic CHF as well as COPD and morbid obesity with pickwickian syndrome and obesity hypoventilation with chronic hypercapnia.  She also has a history of pulmonary hypertension, obstructive sleep apnea atrial fibrillation and essential hypertension.  She came into the hospital with complaints of acute onset dyspnea with hypoxemia.  Daughter was able to respond and was able to increase oxygen therapy as well as call EMS due to respiratory failure.  She was placed on BiPAP during my evaluation.  Settings on her BiPAP showed tidal volume in the 350s with inspiratory pressure of 18 cm of water over expiratory pressure of 10 with a rate of 20 and FiO2 of 50%.  At baseline patient uses 4 L/min nasal cannula.  Blood work on arrival showed that she was normoxic on the settings on BiPAP with pH of 7.33 however CO2 was at 91 and PaO2 at 94.  Pulmonary consultation for further evaluation and management of complex comorbid pulmonary patient.    PAST MEDICAL HISTORY   Past Medical History:  Diagnosis Date  . (HFpEF) heart failure with preserved ejection fraction ([place])   . Chronic respiratory failure with hypoxia ([place])   . COPD (chronic obstructive pulmonary disease) ([place])   . Coronary artery disease, non-occlusive   . Hypertension   . OSA (obstructive sleep apnea)   . PAF (paroxysmal atrial fibrillation) ([place])   . Pulmonary hypertension ([place])      SURGICAL HISTORY   Past Surgical History:  Procedure Laterality Date  . NO PAST SURGERIES    . RIGHT/LEFT HEART CATH AND CORONARY ANGIOGRAPHY Left 12/21/2019   Procedure: RIGHT/LEFT  HEART CATH AND CORONARY ANGIOGRAPHY;  Surgeon: Wellington Hampshire, MD;  Location: [facility];  Service: Cardiovascular;  Laterality: Left;     FAMILY HISTORY   Family History  Problem Relation Age of Onset  . Hypertension Other      SOCIAL HISTORY   Social History   Tobacco Use  . Smoking status: Former    Types: Cigarettes    Quit date: 06/03/2017    Years since quitting: 3.8  . Smokeless tobacco: Never  Vaping Use  . Vaping Use: Never used  Substance Use Topics  . Alcohol use: Not Currently  . Drug use: Never     MEDICATIONS    Home Medication:  Current Outpatient Rx  . Order #: [id number] Class: Historical Med  . Order #: [id number] Class: Normal  . Order #: [id number] Class: Historical Med  . Order #: [id number] Class: Normal  . Order #: [id number] Class: Historical Med  . Order #: [id number] Class: Historical Med  . Order #: [id number] Class: Normal  . Order #: [id number] Class: Historical Med  . Order #: [id number] Class: Normal  . Order #: [id number] Class: Historical Med  . Order #: [id number] Class: Historical Med  . Order #: [id number] Class: Normal  . Order #: [id number] Class: Historical Med  . Order #: [id number] Class: Historical Med  . Order #: [id number] Class: Historical Med  . Order #: [id number] Class: Normal  . Order #: [id number] Class: Historical Med  . Order #:  [id number] Class: Historical Med  . Order #: [id number] Class: Historical Med  . Order #: [id number] Class: Historical Med  . Order #: [id number] Class: Historical Med    Current Medication:  Current Facility-Administered Medications:  .  0.9 %  sodium chloride infusion, 250 mL, Intravenous, PRN, Agbata, Tochukwu, MD .  acetaminophen (TYLENOL) tablet 650 mg, 650 mg, Oral, Q6H PRN **OR** acetaminophen (TYLENOL) suppository 650 mg, 650 mg, Rectal, Q6H PRN, Agbata, Tochukwu, MD .  apixaban (ELIQUIS) tablet 5 mg, 5 mg, Oral, BID, Agbata, Tochukwu, MD .  Derrill Memo ON 04/26/2021] atorvastatin (LIPITOR) tablet 20 mg,  20 mg, Oral, Daily, Agbata, Tochukwu, MD .  Derrill Memo ON 04/26/2021] bisoprolol (ZEBETA) tablet 5 mg, 5 mg, Oral, Daily, Agbata, Tochukwu, MD .  Derrill Memo ON 04/26/2021] cefTRIAXone (ROCEPHIN) 1 g in sodium chloride 0.9 % 100 mL IVPB, 1 g, Intravenous, Q24H, Agbata, Tochukwu, MD .  escitalopram (LEXAPRO) tablet 20 mg, 20 mg, Oral, Daily, Agbata, Tochukwu, MD .  ferrous gluconate (FERGON) tablet 324 mg, 324 mg, Oral, q morning, Agbata, Tochukwu, MD .  fluticasone furoate-vilanterol (BREO ELLIPTA) 100-25 MCG/INH 1 puff, 1 puff, Inhalation, Daily **AND** umeclidinium bromide (INCRUSE ELLIPTA) 62.5 MCG/INH 1 puff, 1 puff, Inhalation, Daily, Ardeen Garland L, RPH .  furosemide (LASIX) tablet 40 mg, 40 mg, Oral, BID, Agbata, Tochukwu, MD .  ipratropium-albuterol (DUONEB) 0.5-2.5 (3) MG/3ML nebulizer solution 3 mL, 3 mL, Nebulization, Q6H PRN, Agbata, Tochukwu, MD .  methylPREDNISolone sodium succinate (SOLU-MEDROL) 40 mg/mL injection 40 mg, 40 mg, Intravenous, Q12H **FOLLOWED BY** [START ON 04/26/2021] predniSONE (DELTASONE) tablet 40 mg, 40 mg, Oral, Q breakfast, Agbata, Tochukwu, MD .  montelukast (SINGULAIR) tablet 10 mg, 10 mg, Oral, QHS, Agbata, Tochukwu, MD .  ondansetron (ZOFRAN) tablet 4 mg, 4 mg, Oral, Q6H PRN **OR** ondansetron (ZOFRAN) injection 4 mg, 4 mg, Intravenous, Q6H PRN, Agbata, Tochukwu, MD .  Derrill Memo ON 04/26/2021] pantoprazole (PROTONIX) EC tablet 40 mg, 40 mg, Oral, Daily, Agbata, Tochukwu, MD .  sodium chloride flush (NS) 0.9 % injection 3 mL, 3 mL, Intravenous, Q12H, Agbata, Tochukwu, MD .  sodium chloride flush (NS) 0.9 % injection 3 mL, 3 mL, Intravenous, PRN, Agbata, Tochukwu, MD .  Derrill Memo ON 04/26/2021] spironolactone (ALDACTONE) tablet 25 mg, 25 mg, Oral, Daily, Agbata, Tochukwu, MD  Current Outpatient Medications:  .  albuterol (VENTOLIN HFA) 108 (90 Base) MCG/ACT inhaler, Inhale 2 puffs into the lungs every 6 (six) hours as needed for wheezing or shortness of breath., Disp: , Rfl:  .   atorvastatin (LIPITOR) 20 MG tablet, Take 1 tablet (20 mg total) by mouth daily at 6 PM., Disp: 90 tablet, Rfl: 0 .  bisoprolol (ZEBETA) 5 MG tablet, Take 5 mg by mouth daily., Disp: , Rfl:  .  ELIQUIS 5 MG TABS tablet, TAKE 1 TABLET(5 MG) BY MOUTH TWICE DAILY, Disp: 60 tablet, Rfl: 2 .  escitalopram (LEXAPRO) 20 MG tablet, Take 20 mg by mouth daily., Disp: , Rfl:  .  esomeprazole (NEXIUM) 20 MG capsule, Take 20 mg by mouth daily at 12 noon., Disp: , Rfl:  .  furosemide (LASIX) 40 MG tablet, Take 1 tablet (40 mg total) by mouth daily. (Patient taking differently: Take 80 mg by mouth daily.), Disp: 90 tablet, Rfl: 0 .  ipratropium (ATROVENT) 0.06 % nasal spray, Place 2 sprays into both nostrils 3 (three) times daily as needed., Disp: , Rfl:  .  loratadine (CLARITIN) 10 MG tablet, Take 1 tablet (10 mg total) by mouth daily., Disp: 30 tablet, Rfl: 2 .  montelukast (  SINGULAIR) 10 MG tablet, Take 10 mg by mouth at bedtime., Disp: , Rfl:  .  predniSONE (DELTASONE) 20 MG tablet, Take 20 mg by mouth daily with breakfast., Disp: , Rfl:  .  spironolactone (ALDACTONE) 25 MG tablet, Take 1 tablet (25 mg total) by mouth daily. (Patient taking differently: Take 50 mg by mouth daily.), Disp: 90 tablet, Rfl: 0 .  azithromycin (ZITHROMAX) 250 MG tablet, Take 250 mg by mouth daily., Disp: , Rfl:  .  famotidine (PEPCID) 20 MG tablet, Take 20 mg by mouth daily. (Patient not taking: Reported on 04/25/2021), Disp: , Rfl:  .  ferrous gluconate (FERGON) 324 MG tablet, Take 324 mg by mouth every morning., Disp: , Rfl:  .  fluticasone (FLONASE) 50 MCG/ACT nasal spray, Place 2 sprays into both nostrils daily., Disp: 9.9 mL, Rfl: 2 .  guaiFENesin (MUCINEX) 600 MG 12 hr tablet, Take 600 mg by mouth daily. (Patient not taking: No sig reported), Disp: , Rfl:  .  Ipratropium-Albuterol (COMBIVENT RESPIMAT) 20-100 MCG/ACT AERS respimat, Inhale 1 puff into the lungs every 6 (six) hours as needed (wheezing/shortness of breath.). , Disp:  , Rfl:  .  ipratropium-albuterol (DUONEB) 0.5-2.5 (3) MG/3ML SOLN, Take 3 mLs by nebulization every 6 (six) hours as needed (for wheezing/shortness of breath.). , Disp: , Rfl:  .  TRELEGY ELLIPTA 100-62.5-25 MCG/INH AEPB, Inhale 1 puff into the lungs daily., Disp: , Rfl:  .  Vitamin D, Ergocalciferol, (DRISDOL) 1.25 MG (50000 UNIT) CAPS capsule, Take 50,000 Units by mouth once a week., Disp: , Rfl:     ALLERGIES   Codeine and Sulfa antibiotics     REVIEW OF SYSTEMS    Review of Systems:  Gen:  Denies  fever, sweats, chills weigh loss  HEENT: Denies blurred vision, double vision, ear pain, eye pain, hearing loss, nose bleeds, sore throat Cardiac:  No dizziness, chest pain or heaviness, chest tightness,edema Resp:   Denies cough or sputum porduction, shortness of breath,wheezing, hemoptysis,  Gi: Denies swallowing difficulty, stomach pain, nausea or vomiting, diarrhea, constipation, bowel incontinence Gu:  Denies bladder incontinence, burning urine Ext:   Denies Joint pain, stiffness or swelling Skin: Denies  skin rash, easy bruising or bleeding or hives Endoc:  Denies polyuria, polydipsia , polyphagia or weight change Psych:   Denies depression, insomnia or hallucinations   Other:  All other systems negative   VS: BP 135/73   Pulse 68   Temp (!) 96.7 F (35.9 C) (Rectal)   Resp (!) 24   Ht _0  (1.651 m)   Wt 121 kg   SpO2 98%   BMI 44.39 kg/m      PHYSICAL EXAM    GENERAL:NAD, no fevers, chills, no weakness no fatigue HEAD: Normocephalic, atraumatic.  EYES: Pupils equal, round, reactive to light. Extraocular muscles intact. No scleral icterus.  MOUTH: Moist mucosal membrane. Dentition intact. No abscess noted.  EAR, NOSE, THROAT: Clear without exudates. No external lesions.  NECK: Supple. No thyromegaly. No nodules. No JVD.  PULMONARY: Diffuse coarse rhonchi right sided +wheezes CARDIOVASCULAR: S1 and S2. Regular rate and rhythm. No murmurs, rubs, or gallops.  No edema. Pedal pulses 2+ bilaterally.  GASTROINTESTINAL: Soft, nontender, nondistended. No masses. Positive bowel sounds. No hepatosplenomegaly.  MUSCULOSKELETAL: No swelling, clubbing, or edema. Range of motion full in all extremities.  NEUROLOGIC: Cranial nerves II through XII are intact. No gross focal neurological deficits. Sensation intact. Reflexes intact.  SKIN: No ulceration, lesions, rashes, or cyanosis. Skin warm and dry. Turgor  intact.  PSYCHIATRIC: Mood, affect within normal limits. The patient is awake, alert and oriented x 3. Insight, judgment intact.       IMAGING    CT HEAD WO CONTRAST (5MM)  Result Date: 04/25/2021 CLINICAL DATA:  70 year old female with history of mental status change. Shortness of breath. EXAM: CT HEAD WITHOUT CONTRAST TECHNIQUE: Contiguous axial images were obtained from the base of the skull through the vertex without intravenous contrast. COMPARISON:  None. FINDINGS: Brain: Patchy and confluent areas of decreased attenuation are noted throughout the deep and periventricular white matter of the cerebral hemispheres bilaterally, compatible with chronic microvascular ischemic disease. No evidence of acute infarction, hemorrhage, hydrocephalus, extra-axial collection or mass lesion/mass effect. Vascular: No hyperdense vessel or unexpected calcification. Skull: Normal. Negative for fracture or focal lesion. Sinuses/Orbits: No acute finding. Other: None. IMPRESSION: 1. No acute intracranial abnormalities. 2. Chronic microvascular ischemic changes in the cerebral white matter, as above. Electronically Signed   By: Vinnie Langton M.D.   On: 04/25/2021 06:53   CT Angio Chest Pulmonary Embolism (PE) W or WO Contrast  Result Date: 04/25/2021 CLINICAL DATA:  70 year old female with shortness of breath, wheezing. On home oxygen. EXAM: CT ANGIOGRAPHY CHEST WITH CONTRAST TECHNIQUE: Multidetector CT imaging of the chest was performed using the standard protocol during  bolus administration of intravenous contrast. Multiplanar CT image reconstructions and MIPs were obtained to evaluate the vascular anatomy. CONTRAST:  166m OMNIPAQUE IOHEXOL 350 MG/ML SOLN COMPARISON:  Portable chest earlier today.  Chest CTA 11/08/2019. FINDINGS: Cardiovascular: Adequate contrast bolus timing in the pulmonary arterial tree. Respiratory motion, most pronounced in the lower lobe basal segments. No convincing filling defect identified in the pulmonary arteries to suggest acute pulmonary embolism. Mild cardiomegaly. No pericardial effusion. Calcified aortic atherosclerosis. Calcified coronary artery atherosclerosis and/or stents. Mediastinum/Nodes: Negative.  No mediastinal lymphadenopathy. Lungs/Pleura: Lower lung volumes compared to last year with atelectatic changes to the trachea and major airways. Atelectasis in the lingula. Mild dependent and peribronchial opacity in both lower lobes more resembles atelectasis than infection. No pleural effusion. No other confluent pulmonary opacity. Centrilobular emphysema was more apparent on the CTA last year. There is mild septal thickening in the upper lobes today. Upper Abdomen: Negative visible liver, spleen, adrenal glands. Chronic left renal upper pole cysts. Decompressed visible stomach. Musculoskeletal: Ankylosis in the thoracic spine related to chronic flowing endplate osteophytes. No acute osseous abnormality identified. Review of the MIP images confirms the above findings. IMPRESSION: 1. Mild respiratory motion but no convincing pulmonary embolus. 2. Emphysema (ICD10-J43.9) with lower lung volumes than the CTA last year. Patchy lower lobe opacity more resembles atelectasis than infection. Mild septal thickening in the upper lobes without overt edema. No pleural effusion. 3. Mild cardiomegaly. Calcified coronary artery and Aortic Atherosclerosis (ICD10-I70.0). Electronically Signed   By: HGenevie AnnM.D.   On: 04/25/2021 09:41   DG Chest Portable 1  View  Result Date: 04/25/2021 CLINICAL DATA:  70year old female with shortness of breath, wheezing, pulse ox in the 80s on room air. EXAM: PORTABLE CHEST 1 VIEW COMPARISON:  CT chest 06/15/2020 and earlier. FINDINGS: Portable AP upright view at 0534 hours. Stable cardiomegaly and mediastinal contours. Stable lung volumes. Visualized tracheal air column is within normal limits. Chronic increased pulmonary interstitial opacity, not significantly changed from several portable radiographs last year. No pneumothorax, pleural effusion or consolidation. Chronic right lateral rib fractures. No acute osseous abnormality identified. Paucity of bowel gas in the upper abdomen. IMPRESSION: Cardiomegaly and increased pulmonary interstitial opacity  appears stable from last year. But consider acute interstitial edema. Electronically Signed   By: Genevie Ann M.D.   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  Total face to face encounter time for this patient visit was  19mn. >50% of the time was  spent in counseling and coordination of care.   Patient/Family are satisfied with care plan and all questions have been answered.  This document was prepared using Dragon voice recognition software and may include unintentional dictation errors.     FOttie Glazier M.D.  [facility] [place]

## 2021-04-25 NOTE — ED Provider Notes (Signed)
[facility] Emergency Department Provider Note  ____________________________________________   Event Date/Time   First MD Initiated Contact with Patient 04/25/21 [phone]     (approximate)  I have reviewed the triage vital signs and the nursing notes.   HISTORY  Chief Complaint Shortness of Breath    HPI Paula Torres is a 70 y.o. female with history of CHF, COPD, pulmonary hypertension, atrial fibrillation on Eliquis, hypertension, obstructive sleep apnea, obesity who is on 3-4 L at home [place] who presents to the emergency department EMS for shortness of breath.  Sats found to be 80% by fire department and she was placed on a nonrebreather.  Patient is very drowsy and arousable to voice but unable to answer many questions.  She denies having any pain.  Daughter provides most of the history.  Daughter states around 2:30 AM her mother called her downstairs.  States she was feeling short of breath.  Sats were 62% on 4 L nasal cannula.  Daughter increased the oxygen to 4-1/2 L and put her on a facemask.  They gave her her albuterol rescue inhaler and a prednisone tablet at 3:30 AM for concerns for a COPD exacerbation.  Every time they tried to switch her back to nasal cannula her oxygen saturation would drop again.  She has been admitted before for respiratory failure but has never been intubated.  She states that this time her mother is a full code but is very worried about ever being on a ventilator because she was told that she may never be able to come off.   Pulmonologist Dr. Craig Guess with daughter Paula Torres at [phone]  Past Medical History:  Diagnosis Date   (HFpEF) heart failure with preserved ejection fraction (HCC)    Chronic respiratory failure with hypoxia (HCC)    COPD (chronic obstructive pulmonary disease) (HCC)    Coronary artery disease, non-occlusive    Hypertension    OSA (obstructive sleep apnea)    PAF (paroxysmal atrial  fibrillation) ([place])    Pulmonary hypertension ([place])     Patient Active Problem List   Diagnosis Date Noted   Acute on chronic respiratory failure ([place]) 04/25/2021   Acute on chronic respiratory failure with hypoxia and hypercapnia (HCC) 05/11/2020   AF (paroxysmal atrial fibrillation) ([place]) 05/11/2020   Acute on chronic diastolic congestive heart failure (HCC)    Acute respiratory distress    Pulmonary hypertension (HCC)    Restless leg syndrome    Depression    COPD exacerbation ([place]) 05/09/2020   Atrial fibrillation with RVR ([place]) 01/21/2020   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[place]) 01/21/2020   Abnormal cardiovascular stress test    Demand ischemia (HCC)    Hyperlipidemia    COPD with acute exacerbation ([place]) 11/08/2019   Acute on chronic diastolic CHF (congestive heart failure) ([place]) 11/08/2019   Acute respiratory failure with hypoxia and hypercapnia ([place]) 11/08/2019   Morbid obesity with BMI of 50.0-59.9, adult ([place]) 11/08/2019   Elevated troponin 11/08/2019    Past Surgical History:  Procedure Laterality Date   NO PAST SURGERIES     RIGHT/LEFT HEART CATH AND CORONARY ANGIOGRAPHY Left 12/21/2019   Procedure: RIGHT/LEFT HEART CATH AND CORONARY ANGIOGRAPHY;  Surgeon: Wellington Hampshire, MD;  Location: [place] [facility];  Service: Cardiovascular;  Laterality: Left;    Prior to Admission medications   Medication Sig Start Date End Date Taking? Authorizing Provider  albuterol (VENTOLIN HFA) 108 (90 Base) MCG/ACT inhaler Inhale 2  puffs into the lungs every 6 (six) hours as needed for wheezing or shortness of breath.    [provider]  atorvastatin (LIPITOR) 20 MG tablet Take 1 tablet (20 mg total) by mouth daily at 6 PM. 11/23/19   Loel Dubonnet, NP  bisoprolol (ZEBETA) 5 MG tablet Take 5 mg by mouth daily. 03/09/20   [provider]  ELIQUIS 5 MG TABS tablet TAKE 1 TABLET(5 MG) BY MOUTH TWICE DAILY 05/30/20   Rise Mu, PA-C  escitalopram  (LEXAPRO) 20 MG tablet Take 20 mg by mouth daily.    [provider]  famotidine (PEPCID) 20 MG tablet Take 20 mg by mouth daily.    [provider]  ferrous gluconate (FERGON) 324 MG tablet Take 324 mg by mouth every morning. 05/04/20   [provider]  fluticasone (FLONASE) 50 MCG/ACT nasal spray Place 2 sprays into both nostrils daily. 05/13/20 06/12/20  Nicole Kindred A, DO  furosemide (LASIX) 40 MG tablet Take 1 tablet (40 mg total) by mouth daily. Patient taking differently: Take 80 mg by mouth daily.  11/23/19   Loel Dubonnet, NP  guaiFENesin (MUCINEX) 600 MG 12 hr tablet Take 600 mg by mouth daily.    [provider]  HYDROcodone-acetaminophen (NORCO/VICODIN) 5-325 MG tablet Take 1 tablet by mouth every 8 (eight) hours as needed. 04/11/20   [provider]  ipratropium (ATROVENT) 0.06 % nasal spray Place 2 sprays into both nostrils 3 (three) times daily as needed. 02/12/20   [provider]  Ipratropium-Albuterol (COMBIVENT RESPIMAT) 20-100 MCG/ACT AERS respimat Inhale 1 puff into the lungs every 6 (six) hours as needed (wheezing/shortness of breath.).     [provider]  ipratropium-albuterol (DUONEB) 0.5-2.5 (3) MG/3ML SOLN Take 3 mLs by nebulization every 6 (six) hours as needed (for wheezing/shortness of breath.).     [provider]  loratadine (CLARITIN) 10 MG tablet Take 1 tablet (10 mg total) by mouth daily. 05/13/20   Nicole Kindred A, DO  montelukast (SINGULAIR) 10 MG tablet Take 10 mg by mouth at bedtime.    [provider]  spironolactone (ALDACTONE) 25 MG tablet Take 1 tablet (25 mg total) by mouth daily. Patient taking differently: Take 50 mg by mouth daily.  11/23/19   Loel Dubonnet, NP  TRELEGY ELLIPTA 100-62.5-25 MCG/INH AEPB Inhale 1 puff into the lungs daily. 11/20/19   [provider]    Allergies Codeine and Sulfa antibiotics  Family History  Problem Relation Age of Onset    Hypertension Other     Social History Social History   Tobacco Use   Smoking status: Former    Types: Cigarettes    Quit date: 06/03/2017    Years since quitting: 3.8   Smokeless tobacco: Never  Vaping Use   Vaping Use: Never used  Substance Use Topics   Alcohol use: Not Currently   Drug use: Never    Review of Systems Level 5 caveat for altered mental status  ____________________________________________   PHYSICAL EXAM:  VITAL SIGNS: ED Triage Vitals  Enc Vitals Group     BP 04/25/21 0540 (!) 141/75     Pulse Rate 04/25/21 0511 70     Resp 04/25/21 0511 (!) 22     Temp 04/25/21 0511 (!) 96.7 F (35.9 C)     Temp Source 04/25/21 0511 Rectal     SpO2 04/25/21 0511 97 %     Weight 04/25/21 0511 266 lb 12.1 oz (121 kg)  Height 04/25/21 0511 _0  (1.651 m)     Head Circumference --      Peak Flow --      Pain Score --      Pain Loc --      Pain Edu? --      Excl. in [place]? --    CONSTITUTIONAL: Alert and oriented to person.  Arousable to voice but falls asleep quickly.  Somnolent.  Obese.  Afebrile. HEAD: Normocephalic, atraumatic EYES: Conjunctivae clear, pupils appear equal, EOM appear intact ENT: normal nose; moist mucous membranes NECK: Supple, normal ROM CARD: RRR; S1 and S2 appreciated; no murmurs, no clicks, no rubs, no gallops RESP: Patient is tachypneic.  No rhonchi or rales.  Wheezing diffusely.  Diminished at bases bilaterally. ABD/GI: Normal bowel sounds; non-distended; soft, non-tender, no rebound, no guarding, no peritoneal signs, no hepatosplenomegaly BACK: The back appears normal EXT: Normal ROM in all joints; no deformity noted, no edema; no cyanosis SKIN: Normal color for age and race; warm; multiple superficial open wounds to her torso and extremities with no surrounding redness, warmth, bleeding or drainage NEURO: Moves all extremities equally, drowsy but arousable to voice PSYCH: The patient's mood and manner are  appropriate.  ____________________________________________   LABS (all labs ordered are listed, but only abnormal results are displayed)  Labs Reviewed  CBC WITH DIFFERENTIAL/PLATELET - Abnormal; Notable for the following components:      Result Value   WBC 18.8 (*)    Hemoglobin 10.9 (*)    MCHC 29.6 (*)    RDW 16.0 (*)    Neutro Abs 16.6 (*)    Abs Immature Granulocytes 0.13 (*)    All other components within normal limits  COMPREHENSIVE METABOLIC PANEL - Abnormal; Notable for the following components:   Chloride 88 (*)    CO2 40 (*)    Glucose, Bld 169 (*)    Creatinine, Ser 1.11 (*)    AST 10 (*)    GFR, Estimated 54 (*)    All other components within normal limits  BLOOD GAS, ARTERIAL - Abnormal; Notable for the following components:   pH, Arterial 7.26 (*)    pCO2 arterial 102 (*)    Bicarbonate 45.8 (*)    Acid-Base Excess 14.9 (*)    All other components within normal limits  URINALYSIS, COMPLETE (UACMP) WITH MICROSCOPIC - Abnormal; Notable for the following components:   Color, Urine YELLOW (*)    APPearance HAZY (*)    Hgb urine dipstick MODERATE (*)    Nitrite POSITIVE (*)    Leukocytes,Ua TRACE (*)    RBC / HPF >50 (*)    Bacteria, UA MANY (*)    All other components within normal limits  BLOOD GAS, ARTERIAL - Abnormal; Notable for the following components:   pH, Arterial 7.33 (*)    pCO2 arterial 91 (*)    Bicarbonate 48.0 (*)    Acid-Base Excess 18.2 (*)    Allens test (pass/fail) POSITIVE (*)    All other components within normal limits  CBG MONITORING, ED - Abnormal; Notable for the following components:   Glucose-Capillary 161 (*)    All other components within normal limits  RESP PANEL BY RT-PCR (FLU A&B, COVID) ARPGX2  URINE CULTURE  CULTURE, BLOOD (ROUTINE X 2)  CULTURE, BLOOD (ROUTINE X 2)  AMMONIA  BRAIN NATRIURETIC PEPTIDE  ETHANOL  URINE DRUG SCREEN, QUALITATIVE ([facility] ONLY)  PROCALCITONIN  TROPONIN I (HIGH SENSITIVITY)  TROPONIN I  (HIGH SENSITIVITY)   ____________________________________________  EKG   EKG Interpretation  Date/Time:  Tuesday April 25 2021 05:20:39 EDT Ventricular Rate:  73 PR Interval:  179 QRS Duration: 115 QT Interval:  435 QTC Calculation: 480 R Axis:   100 Text Interpretation: Right and left arm electrode reversal, interpretation assumes no reversal Sinus rhythm Nonspecific intraventricular conduction delay Low voltage, precordial leads Probable lateral infarct, age indeterminate Confirmed by Pryor Curia [phone]) on 04/25/2021 6:15:20 AM         ____________________________________________  RADIOLOGY Jessie Foot Taytem Ghattas, personally viewed and evaluated these images (plain radiographs) as part of my medical decision making, as well as reviewing the written report by the radiologist.  ED MD interpretation: Interstitial edema.  Official radiology report(s): CT HEAD WO CONTRAST (5MM)  Result Date: 04/25/2021 CLINICAL DATA:  70 year old female with history of mental status change. Shortness of breath. EXAM: CT HEAD WITHOUT CONTRAST TECHNIQUE: Contiguous axial images were obtained from the base of the skull through the vertex without intravenous contrast. COMPARISON:  None. FINDINGS: Brain: Patchy and confluent areas of decreased attenuation are noted throughout the deep and periventricular white matter of the cerebral hemispheres bilaterally, compatible with chronic microvascular ischemic disease. No evidence of acute infarction, hemorrhage, hydrocephalus, extra-axial collection or mass lesion/mass effect. Vascular: No hyperdense vessel or unexpected calcification. Skull: Normal. Negative for fracture or focal lesion. Sinuses/Orbits: No acute finding. Other: None. IMPRESSION: 1. No acute intracranial abnormalities. 2. Chronic microvascular ischemic changes in the cerebral white matter, as above. Electronically Signed   By: Vinnie Langton M.D.   On: 04/25/2021 06:53   DG Chest Portable 1  View  Result Date: 04/25/2021 CLINICAL DATA:  70 year old female with shortness of breath, wheezing, pulse ox in the 80s on room air. EXAM: PORTABLE CHEST 1 VIEW COMPARISON:  CT chest 06/15/2020 and earlier. FINDINGS: Portable AP upright view at 0534 hours. Stable cardiomegaly and mediastinal contours. Stable lung volumes. Visualized tracheal air column is within normal limits. Chronic increased pulmonary interstitial opacity, not significantly changed from several portable radiographs last year. No pneumothorax, pleural effusion or consolidation. Chronic right lateral rib fractures. No acute osseous abnormality identified. Paucity of bowel gas in the upper abdomen. IMPRESSION: Cardiomegaly and increased pulmonary interstitial opacity appears stable from last year. But consider acute interstitial edema. Electronically Signed   By: Genevie Ann M.D.   On: 04/25/2021 05:58    ____________________________________________   PROCEDURES  Procedure(s) performed (including Critical Care):  Procedures  CRITICAL CARE Performed by: Cyril Mourning Felder Lebeda   Total critical care time: 65 minutes  Critical care time was exclusive of separately billable procedures and treating other patients.  Critical care was necessary to treat or prevent imminent or life-threatening deterioration.  Critical care was time spent personally by me on the following activities: development of treatment plan with patient and/or surrogate as well as nursing, discussions with consultants, evaluation of patient's response to treatment, examination of patient, obtaining history from patient or surrogate, ordering and performing treatments and interventions, ordering and review of laboratory studies, ordering and review of radiographic studies, pulse oximetry and re-evaluation of patient's condition.  ____________________________________________   INITIAL IMPRESSION / ASSESSMENT AND PLAN / ED COURSE  As part of my medical decision making, I  reviewed the following data within the [place] History obtained from family, Nursing notes reviewed and incorporated, Labs reviewed , EKG interpreted , Old EKG reviewed, Old chart reviewed, Radiograph reviewed , Discussed with admitting physician , and Notes from prior ED visits  Patient here with shortness of breath.  Concern for COPD exacerbation versus CHF.  Differential also includes pneumonia, PE, ACS.  Patient currently on 10 L nasal cannula satting 100%.  Will obtain labs, chest x-ray, ABG.  She is very somnolent here which may be from respiratory failure but will also check CT head, ammonia level, ethanol and urine drug screen, urine to evaluate for stroke, intracranial hemorrhage, hepatic encephalopathy, intoxication, UTI that could be contributing to symptoms.  ED PROGRESS  6:14 AM  Blood gas shows a significant respiratory acidosis.  Patient does appear to be improving slowly on BiPAP and is now more arousable and able to answer questions.  Daughter at bedside.   7:16 AM  Pt's blood gas improving.  Clinically she also appears to be improving.  She seems more arousable and is able to speak in sentences.  I do not feel she needs to be intubated at this time.  Patient also appears to have a UTI.  Culture pending.  We will add on blood cultures.  White count of 18,000 here.  Will give Rocephin.  Drug screen and ethanol level negative.  Ammonia level normal.  Troponin negative.  Chest x-ray concerning for possible pulmonary edema however BNP is normal.  Could be falsely low due to obesity.  COVID and flu negative.  Head CT shows no acute intracranial abnormality.  7:23 AM Discussed patient's case with hospitalist, Dr. Francine Graven.  I have recommended admission and patient (and family if present) agree with this plan. Admitting physician will place admission orders.   I reviewed all nursing notes, vitals, pertinent previous records and reviewed/interpreted all  EKGs, lab and urine results, imaging (as available).  ____________________________________________   FINAL CLINICAL IMPRESSION(S) / ED DIAGNOSES  Final diagnoses:  COPD exacerbation ([place])  Respiratory acidosis  Acute UTI  Acute on chronic congestive heart failure, unspecified heart failure type ([place])  Acute respiratory failure with hypoxia and hypercapnia [facility])     ED Discharge Orders     None       *Please note:  Paula Torres was evaluated in Emergency Department on 04/25/2021 for the symptoms described in the history of present illness. She was evaluated in the context of the global COVID-19 pandemic, which necessitated consideration that the patient might be at risk for infection with the SARS-CoV-2 virus that causes COVID-19. Institutional protocols and algorithms that pertain to the evaluation of patients at risk for COVID-19 are in a state of rapid change based on information released by regulatory bodies including the CDC and federal and state organizations. These policies and algorithms were followed during the patient's care in the ED.  Some ED evaluations and interventions may be delayed as a result of limited staffing during and the pandemic.*   Note:  This document was prepared using Dragon voice recognition software and may include unintentional dictation errors.    Malayzia Laforte, Delice Bison, DO 04/25/21 [phone]

## 2021-04-26 ENCOUNTER — Inpatient Hospital Stay: Payer: Medicare HMO

## 2021-04-26 LAB — BASIC METABOLIC PANEL
Anion gap: 9 (ref 5–15)
BUN: 27 mg/dL — ABNORMAL HIGH (ref 8–23)
CO2: 42 mmol/L — ABNORMAL HIGH (ref 22–32)
Calcium: 9.2 mg/dL (ref 8.9–10.3)
Chloride: 85 mmol/L — ABNORMAL LOW (ref 98–111)
Creatinine, Ser: 1.25 mg/dL — ABNORMAL HIGH (ref 0.44–1.00)
GFR, Estimated: 47 mL/min — ABNORMAL LOW (ref >60)
Glucose, Bld: 119 mg/dL — ABNORMAL HIGH (ref 70–99)
Potassium: 5.1 mmol/L (ref 3.5–5.1)
Sodium: 136 mmol/L (ref 135–145)

## 2021-04-26 LAB — BRAIN NATRIURETIC PEPTIDE: B Natriuretic Peptide: 135.6 pg/mL — ABNORMAL HIGH (ref 0.0–100.0)

## 2021-04-26 LAB — BLOOD GAS, VENOUS
Acid-Base Excess: 23.3 mmol/L — ABNORMAL HIGH (ref 0.0–2.0)
Bicarbonate: 52 mmol/L — ABNORMAL HIGH (ref 20.0–28.0)
O2 Saturation: 96.1 %
Patient temperature: 37
pCO2, Ven: 82 mmHg (ref 44.0–60.0)
pH, Ven: 7.41 (ref 7.250–7.430)
pO2, Ven: 82 mmHg — ABNORMAL HIGH (ref 32.0–45.0)

## 2021-04-26 LAB — CBC
HCT: 34 % — ABNORMAL LOW (ref 36.0–46.0)
Hemoglobin: 10.3 g/dL — ABNORMAL LOW (ref 12.0–15.0)
MCH: 26.2 pg (ref 26.0–34.0)
MCHC: 30.3 g/dL (ref 30.0–36.0)
MCV: 86.5 fL (ref 80.0–100.0)
Platelets: 337 10*3/uL (ref 150–400)
RBC: 3.93 MIL/uL (ref 3.87–5.11)
RDW: 15.5 % (ref 11.5–15.5)
WBC: 13.6 10*3/uL — ABNORMAL HIGH (ref 4.0–10.5)
nRBC: 0 % (ref 0.0–0.2)

## 2021-04-26 MED ORDER — PREDNISONE 20 MG PO TABS: 20.0000 mg | ORAL_TABLET | Freq: Every day | ORAL | Status: DC

## 2021-04-26 MED ORDER — CEPHALEXIN 500 MG PO CAPS
500.0000 mg | ORAL_CAPSULE | Freq: Four times a day (QID) | ORAL | Status: DC
  Administered 2021-04-26 – 2021-04-27 (×3): 500 mg via ORAL
  Filled 2021-04-26 (×3): qty 1

## 2021-04-26 NOTE — Progress Notes (Signed)
Pulmonary Medicine          Date: 04/26/2021,   MRN# [id number] Paula Torres March 28, 1951     AdmissionWeight: 121 kg                 CurrentWeight: 131.3 kg   Referring physician: Dr. Francine Graven   CHIEF COMPLAINT:   Acute on chronic hypercapnic and hypoxemic respiratory failure   HISTORY OF PRESENT ILLNESS   Is a 70 year old female with a history of diastolic CHF as well as COPD and morbid obesity with pickwickian syndrome and obesity hypoventilation with chronic hypercapnia.  She also has a history of pulmonary hypertension, obstructive sleep apnea atrial fibrillation and essential hypertension.  She came into the hospital with complaints of acute onset dyspnea with hypoxemia.  Daughter was able to respond and was able to increase oxygen therapy as well as call EMS due to respiratory failure.  She was placed on BiPAP during my evaluation.  Settings on her BiPAP showed tidal volume in the 350s with inspiratory pressure of 18 cm of water over expiratory pressure of 10 with a rate of 20 and FiO2 of 50%.  At baseline patient uses 4 L/min nasal cannula.  Blood work on arrival showed that she was normoxic on the settings on BiPAP with pH of 7.33 however CO2 was at 91 and PaO2 at 94.  Pulmonary consultation for further evaluation and management of complex comorbid pulmonary patient.  04/26/21- patient is singifincantly improved. Shes lucid speaking in full sentences.  Will start PT/OT today and ok to start DC planning for next 24-48h.   PAST MEDICAL HISTORY   Past Medical History:  Diagnosis Date  . (HFpEF) heart failure with preserved ejection fraction ([place])   . Chronic respiratory failure with hypoxia ([place])   . COPD (chronic obstructive pulmonary disease) ([place])   . Coronary artery disease, non-occlusive   . Hypertension   . OSA (obstructive sleep apnea)   . PAF (paroxysmal atrial fibrillation) ([place])   . Pulmonary hypertension ([place])      SURGICAL HISTORY   Past Surgical  History:  Procedure Laterality Date  . NO PAST SURGERIES    . RIGHT/LEFT HEART CATH AND CORONARY ANGIOGRAPHY Left 12/21/2019   Procedure: RIGHT/LEFT HEART CATH AND CORONARY ANGIOGRAPHY;  Surgeon: Wellington Hampshire, MD;  Location: [place] [facility];  Service: Cardiovascular;  Laterality: Left;     FAMILY HISTORY   Family History  Problem Relation Age of Onset  . Hypertension Other      SOCIAL HISTORY   Social History   Tobacco Use  . Smoking status: Former    Types: Cigarettes    Quit date: 06/03/2017    Years since quitting: 3.8  . Smokeless tobacco: Never  Vaping Use  . Vaping Use: Never used  Substance Use Topics  . Alcohol use: Not Currently  . Drug use: Never     MEDICATIONS    Home Medication:  REM   Current Medication:  Current Facility-Administered Medications:  .  0.9 %  sodium chloride infusion, 250 mL, Intravenous, PRN, Agbata, Tochukwu, MD .  acetaminophen (TYLENOL) tablet 650 mg, 650 mg, Oral, Q6H PRN **OR** acetaminophen (TYLENOL) suppository 650 mg, 650 mg, Rectal, Q6H PRN, Agbata, Tochukwu, MD .  apixaban (ELIQUIS) tablet 5 mg, 5 mg, Oral, BID, Agbata, Tochukwu, MD, 5 mg at 04/26/21 0819 .  atorvastatin (LIPITOR) tablet 20 mg, 20 mg, Oral, Daily, Agbata, Tochukwu, MD, 20 mg at 04/26/21 0819 .  bisoprolol (ZEBETA) tablet  5 mg, 5 mg, Oral, Daily, Agbata, Tochukwu, MD, 5 mg at 04/26/21 0819 .  cefTRIAXone (ROCEPHIN) 1 g in sodium chloride 0.9 % 100 mL IVPB, 1 g, Intravenous, Q24H, Agbata, Tochukwu, MD, Last Rate: 200 mL/hr at 04/26/21 0537, 1 g at 04/26/21 0537 .  escitalopram (LEXAPRO) tablet 20 mg, 20 mg, Oral, Daily, Agbata, Tochukwu, MD .  ferrous gluconate (FERGON) tablet 324 mg, 324 mg, Oral, q morning, Agbata, Tochukwu, MD, 324 mg at 04/26/21 0820 .  fluticasone furoate-vilanterol (BREO ELLIPTA) 100-25 MCG/INH 1 puff, 1 puff, Inhalation, Daily **AND** umeclidinium bromide (INCRUSE ELLIPTA) 62.5 MCG/INH 1 puff, 1 puff, Inhalation, Daily, Darnelle Bos, RPH .  furosemide (LASIX) injection 40 mg, 40 mg, Intravenous, BID, Agbata, Tochukwu, MD, 40 mg at 04/26/21 0819 .  ipratropium-albuterol (DUONEB) 0.5-2.5 (3) MG/3ML nebulizer solution 3 mL, 3 mL, Nebulization, Q6H PRN, Agbata, Tochukwu, MD .  montelukast (SINGULAIR) tablet 10 mg, 10 mg, Oral, QHS, Agbata, Tochukwu, MD .  ondansetron (ZOFRAN) tablet 4 mg, 4 mg, Oral, Q6H PRN **OR** ondansetron (ZOFRAN) injection 4 mg, 4 mg, Intravenous, Q6H PRN, Agbata, Tochukwu, MD .  pantoprazole (PROTONIX) EC tablet 40 mg, 40 mg, Oral, Daily, Agbata, Tochukwu, MD, 40 mg at 04/26/21 0820 .  [COMPLETED] methylPREDNISolone sodium succinate (SOLU-MEDROL) 40 mg/mL injection 40 mg, 40 mg, Intravenous, Q12H, 40 mg at 04/26/21 0537 **FOLLOWED BY** predniSONE (DELTASONE) tablet 40 mg, 40 mg, Oral, Q breakfast, Agbata, Tochukwu, MD .  sodium chloride flush (NS) 0.9 % injection 3 mL, 3 mL, Intravenous, Q12H, Agbata, Tochukwu, MD, 3 mL at 04/26/21 0821 .  sodium chloride flush (NS) 0.9 % injection 3 mL, 3 mL, Intravenous, PRN, Agbata, Tochukwu, MD .  spironolactone (ALDACTONE) tablet 25 mg, 25 mg, Oral, Daily, Agbata, Tochukwu, MD, 25 mg at 04/26/21 1610    ALLERGIES   Codeine and Sulfa antibiotics     REVIEW OF SYSTEMS    Review of Systems:  Gen:  Denies  fever, sweats, chills weigh loss  HEENT: Denies blurred vision, double vision, ear pain, eye pain, hearing loss, nose bleeds, sore throat Cardiac:  No dizziness, chest pain or heaviness, chest tightness,edema Resp:   Denies cough or sputum porduction, shortness of breath,wheezing, hemoptysis,  Gi: Denies swallowing difficulty, stomach pain, nausea or vomiting, diarrhea, constipation, bowel incontinence Gu:  Denies bladder incontinence, burning urine Ext:   Denies Joint pain, stiffness or swelling Skin: Denies  skin rash, easy bruising or bleeding or hives Endoc:  Denies polyuria, polydipsia , polyphagia or weight change Psych:   Denies  depression, insomnia or hallucinations   Other:  All other systems negative   VS: BP (!) 152/119 (BP Location: Right Arm)   Pulse 67   Temp 99 F (37.2 C) (Axillary)   Resp 20   Ht _0  (1.651 m)   Wt 131.3 kg   SpO2 95%   BMI 48.16 kg/m      PHYSICAL EXAM    GENERAL:NAD, no fevers, chills, no weakness no fatigue HEAD: Normocephalic, atraumatic.  EYES: Pupils equal, round, reactive to light. Extraocular muscles intact. No scleral icterus.  MOUTH: Moist mucosal membrane. Dentition intact. No abscess noted.  EAR, NOSE, THROAT: Clear without exudates. No external lesions.  NECK: Supple. No thyromegaly. No nodules. No JVD.  PULMONARY: mild crackles bilaterally at bases  CARDIOVASCULAR: S1 and S2. Regular rate and rhythm. No murmurs, rubs, or gallops. No edema. Pedal pulses 2+ bilaterally.  GASTROINTESTINAL: Soft, nontender, nondistended. No masses. Positive bowel sounds. No hepatosplenomegaly.  MUSCULOSKELETAL:  No swelling, clubbing, or edema. Range of motion full in all extremities.  NEUROLOGIC: Cranial nerves II through XII are intact. No gross focal neurological deficits. Sensation intact. Reflexes intact.  SKIN: No ulceration, lesions, rashes, or cyanosis. Skin warm and dry. Turgor intact.  PSYCHIATRIC: Mood, affect within normal limits. The patient is awake, alert and oriented x 3. Insight, judgment intact.       IMAGING    CT HEAD WO CONTRAST (5MM)  Result Date: 04/25/2021 CLINICAL DATA:  70 year old female with history of mental status change. Shortness of breath. EXAM: CT HEAD WITHOUT CONTRAST TECHNIQUE: Contiguous axial images were obtained from the base of the skull through the vertex without intravenous contrast. COMPARISON:  None. FINDINGS: Brain: Patchy and confluent areas of decreased attenuation are noted throughout the deep and periventricular white matter of the cerebral hemispheres bilaterally, compatible with chronic microvascular ischemic disease. No  evidence of acute infarction, hemorrhage, hydrocephalus, extra-axial collection or mass lesion/mass effect. Vascular: No hyperdense vessel or unexpected calcification. Skull: Normal. Negative for fracture or focal lesion. Sinuses/Orbits: No acute finding. Other: None. IMPRESSION: 1. No acute intracranial abnormalities. 2. Chronic microvascular ischemic changes in the cerebral white matter, as above. Electronically Signed   By: Vinnie Langton M.D.   On: 04/25/2021 06:53   CT Angio Chest Pulmonary Embolism (PE) W or WO Contrast  Result Date: 04/25/2021 CLINICAL DATA:  70 year old female with shortness of breath, wheezing. On home oxygen. EXAM: CT ANGIOGRAPHY CHEST WITH CONTRAST TECHNIQUE: Multidetector CT imaging of the chest was performed using the standard protocol during bolus administration of intravenous contrast. Multiplanar CT image reconstructions and MIPs were obtained to evaluate the vascular anatomy. CONTRAST:  154m OMNIPAQUE IOHEXOL 350 MG/ML SOLN COMPARISON:  Portable chest earlier today.  Chest CTA 11/08/2019. FINDINGS: Cardiovascular: Adequate contrast bolus timing in the pulmonary arterial tree. Respiratory motion, most pronounced in the lower lobe basal segments. No convincing filling defect identified in the pulmonary arteries to suggest acute pulmonary embolism. Mild cardiomegaly. No pericardial effusion. Calcified aortic atherosclerosis. Calcified coronary artery atherosclerosis and/or stents. Mediastinum/Nodes: Negative.  No mediastinal lymphadenopathy. Lungs/Pleura: Lower lung volumes compared to last year with atelectatic changes to the trachea and major airways. Atelectasis in the lingula. Mild dependent and peribronchial opacity in both lower lobes more resembles atelectasis than infection. No pleural effusion. No other confluent pulmonary opacity. Centrilobular emphysema was more apparent on the CTA last year. There is mild septal thickening in the upper lobes today. Upper Abdomen:  Negative visible liver, spleen, adrenal glands. Chronic left renal upper pole cysts. Decompressed visible stomach. Musculoskeletal: Ankylosis in the thoracic spine related to chronic flowing endplate osteophytes. No acute osseous abnormality identified. Review of the MIP images confirms the above findings. IMPRESSION: 1. Mild respiratory motion but no convincing pulmonary embolus. 2. Emphysema (ICD10-J43.9) with lower lung volumes than the CTA last year. Patchy lower lobe opacity more resembles atelectasis than infection. Mild septal thickening in the upper lobes without overt edema. No pleural effusion. 3. Mild cardiomegaly. Calcified coronary artery and Aortic Atherosclerosis (ICD10-I70.0). Electronically Signed   By: HGenevie AnnM.D.   On: 04/25/2021 09:41   DG Chest Portable 1 View  Result Date: 04/25/2021 CLINICAL DATA:  70year old female with shortness of breath, wheezing, pulse ox in the 80s on room air. EXAM: PORTABLE CHEST 1 VIEW COMPARISON:  CT chest 06/15/2020 and earlier. FINDINGS: Portable AP upright view at 0534 hours. Stable cardiomegaly and mediastinal contours. Stable lung volumes. Visualized tracheal air column is within normal limits. Chronic increased  pulmonary interstitial opacity, not significantly changed from several portable radiographs last year. No pneumothorax, pleural effusion or consolidation. Chronic right lateral rib fractures. No acute osseous abnormality identified. Paucity of bowel gas in the upper abdomen. IMPRESSION: Cardiomegaly and increased pulmonary interstitial opacity appears stable from last year. But consider acute interstitial edema. Electronically Signed   By: Genevie Ann M.D.   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  Total face to face encounter time for this patient visit was 91mn. >50% of the time was  spent in counseling and coordination of care.   Patient/Family are satisfied with care plan and all questions have been answered.  This document was prepared using Dragon voice recognition software and may include unintentional dictation errors.     FOttie Glazier M.D.  [facility]

## 2021-04-26 NOTE — Progress Notes (Signed)
Patient refused MetaNeb therapy at this time.

## 2021-04-26 NOTE — Progress Notes (Addendum)
[facility] [place]    MR#:  [id number]  DATE OF BIRTH:  1950-12-07  SUBJECTIVE:  patient awake alert no respiratory distress. Currently on nasal cannula oxygen. Off BiPAP. Answering all questions appropriately. No family at bedside  REVIEW OF SYSTEMS:   Review of Systems  Constitutional:  Negative for chills, fever and weight loss.  HENT:  Negative for ear discharge, ear pain and nosebleeds.   Eyes:  Negative for blurred vision, pain and discharge.  Respiratory:  Positive for shortness of breath. Negative for sputum production, wheezing and stridor.   Cardiovascular:  Negative for chest pain, palpitations, orthopnea and PND.  Gastrointestinal:  Negative for abdominal pain, diarrhea, nausea and vomiting.  Genitourinary:  Negative for frequency and urgency.  Musculoskeletal:  Negative for back pain and joint pain.  Neurological:  Positive for weakness. Negative for sensory change and speech change.  Psychiatric/Behavioral:  Negative for depression and hallucinations. The patient is not nervous/anxious.   Tolerating Diet:yes Tolerating PT: pending  DRUG ALLERGIES:   Allergies  Allergen Reactions  . Codeine     Back Headache  . Sulfa Antibiotics     Reported to pt from mother who is no longer living -  Details unknown    VITALS:  Blood pressure (!) 105/56, pulse 73, temperature 99 F (37.2 C), temperature source Axillary, resp. rate 18, height _0  (1.651 m), weight 131.3 kg, SpO2 91 %.  PHYSICAL EXAMINATION:   Physical Exam  GENERAL:  70 y.o.-year-old patient lying in the bed with no acute distress. Morbidly obese LUNGS: decreased breath sounds bilaterally, no wheezing, rales, rhonchi. No use of accessory muscles of respiration.  CARDIOVASCULAR: S1, S2 normal. No murmurs, rubs, or gallops.  ABDOMEN: Soft, nontender, nondistended. Bowel sounds present. No organomegaly or mass.  EXTREMITIES: No cyanosis,  clubbing or edema b/l.   Puffy legs NEUROLOGIC: Cranial nerves II through XII are intact. No focal Motor or sensory deficits b/l.   PSYCHIATRIC:  patient is alert and oriented x 3.  SKIN: No obvious rash, lesion, or ulcer.   LABORATORY PANEL:  CBC Recent Labs  Lab 04/26/21 0520  WBC 13.6*  HGB 10.3*  HCT 34.0*  PLT 337    Chemistries  Recent Labs  Lab 04/25/21 0515 04/26/21 0520  NA 137 136  K 5.0 5.1  CL 88* 85*  CO2 40* 42*  GLUCOSE 169* 119*  BUN 17 27*  CREATININE 1.11* 1.25*  CALCIUM 9.1 9.2  AST 10*  --   ALT 9  --   ALKPHOS 108  --   BILITOT 0.6  --    Cardiac Enzymes No results for input(s): TROPONINI in the last 168 hours. RADIOLOGY:  CT HEAD WO CONTRAST (5MM)  Result Date: 04/25/2021 CLINICAL DATA:  70 year old female with history of mental status change. Shortness of breath. EXAM: CT HEAD WITHOUT CONTRAST TECHNIQUE: Contiguous axial images were obtained from the base of the skull through the vertex without intravenous contrast. COMPARISON:  None. FINDINGS: Brain: Patchy and confluent areas of decreased attenuation are noted throughout the deep and periventricular white matter of the cerebral hemispheres bilaterally, compatible with chronic microvascular ischemic disease. No evidence of acute infarction, hemorrhage, hydrocephalus, extra-axial collection or mass lesion/mass effect. Vascular: No hyperdense vessel or unexpected calcification. Skull: Normal. Negative for fracture or focal lesion. Sinuses/Orbits: No acute finding. Other: None. IMPRESSION: 1. No acute intracranial abnormalities. 2. Chronic microvascular ischemic changes in the cerebral white matter, as  above. Electronically Signed   By: Vinnie Langton M.D.   On: 04/25/2021 06:53   CT Angio Chest Pulmonary Embolism (PE) W or WO Contrast  Result Date: 04/25/2021 CLINICAL DATA:  69 year old female with shortness of breath, wheezing. On home oxygen. EXAM: CT ANGIOGRAPHY CHEST WITH CONTRAST TECHNIQUE:  Multidetector CT imaging of the chest was performed using the standard protocol during bolus administration of intravenous contrast. Multiplanar CT image reconstructions and MIPs were obtained to evaluate the vascular anatomy. CONTRAST:  157m OMNIPAQUE IOHEXOL 350 MG/ML SOLN COMPARISON:  Portable chest earlier today.  Chest CTA 11/08/2019. FINDINGS: Cardiovascular: Adequate contrast bolus timing in the pulmonary arterial tree. Respiratory motion, most pronounced in the lower lobe basal segments. No convincing filling defect identified in the pulmonary arteries to suggest acute pulmonary embolism. Mild cardiomegaly. No pericardial effusion. Calcified aortic atherosclerosis. Calcified coronary artery atherosclerosis and/or stents. Mediastinum/Nodes: Negative.  No mediastinal lymphadenopathy. Lungs/Pleura: Lower lung volumes compared to last year with atelectatic changes to the trachea and major airways. Atelectasis in the lingula. Mild dependent and peribronchial opacity in both lower lobes more resembles atelectasis than infection. No pleural effusion. No other confluent pulmonary opacity. Centrilobular emphysema was more apparent on the CTA last year. There is mild septal thickening in the upper lobes today. Upper Abdomen: Negative visible liver, spleen, adrenal glands. Chronic left renal upper pole cysts. Decompressed visible stomach. Musculoskeletal: Ankylosis in the thoracic spine related to chronic flowing endplate osteophytes. No acute osseous abnormality identified. Review of the MIP images confirms the above findings. IMPRESSION: 1. Mild respiratory motion but no convincing pulmonary embolus. 2. Emphysema (ICD10-J43.9) with lower lung volumes than the CTA last year. Patchy lower lobe opacity more resembles atelectasis than infection. Mild septal thickening in the upper lobes without overt edema. No pleural effusion. 3. Mild cardiomegaly. Calcified coronary artery and Aortic Atherosclerosis (ICD10-I70.0).  Electronically Signed   By: HGenevie AnnM.D.   On: 04/25/2021 09:41   DG Chest Port 1 View  Result Date: 04/26/2021 CLINICAL DATA:  Pulmonary edema. EXAM: PORTABLE CHEST 1 VIEW COMPARISON:  April 25, 2021. FINDINGS: Stable cardiomegaly with central pulmonary vascular congestion. Mild bibasilar pulmonary edema may be present. No pneumothorax or pleural effusion is noted. Bony thorax is unremarkable. IMPRESSION: Stable cardiomegaly with central pulmonary vascular congestion and possible mild bibasilar pulmonary edema. Electronically Signed   By: JMarijo ConceptionM.D.   On: 04/26/2021 10:55   DG Chest Portable 1 View  Result Date: 04/25/2021 CLINICAL DATA:  70year old female with shortness of breath, wheezing, pulse ox in the 80s on room air. EXAM: PORTABLE CHEST 1 VIEW COMPARISON:  CT chest 06/15/2020 and earlier. FINDINGS: Portable AP upright view at 0534 hours. Stable cardiomegaly and mediastinal contours. Stable lung volumes. Visualized tracheal air column is within normal limits. Chronic increased pulmonary interstitial opacity, not significantly changed from several portable radiographs last year. No pneumothorax, pleural effusion or consolidation. Chronic right lateral rib fractures. No acute osseous abnormality identified. Paucity of bowel gas in the upper abdomen. IMPRESSION: Cardiomegaly and increased pulmonary interstitial opacity appears stable from last year. But consider acute interstitial edema. Electronically Signed   By: HGenevie AnnM.D.   On: 04/25/2021 05:58   ASSESSMENT AND PLAN:   LKameshia Madrugais a 70y.o. Caucasian female with medical history significant for morbid obesity (BMI 44.39 kg/m), chronic diastolic CHF, COPD with chronic respiratory failure on 3 - 4 L oxygen continuous/trilogy at night, hypertension, atrial fibrillation and severe pulmonary hypertension who presented to the emergency  room via EMS for evaluation of worsening shortness of breath from her baseline, wheezing and  hypoxia.  acute on chronic respiratory failure with hypoxia and hypercapnia likely secondary to COPD and CHF exacerbation -- at baseline patient wears 3 to 4 L of oxygen -- patient has trilogy at home-- ? Compliance -- currently off BiPAP. Appears lucid -- continue oral steroid and IV Lasix  acute on chronic diastolic congestive heart failure -- Echo showed EF of 60 to 65% with pulmonary hypertension -- continue Lasix -- continue spironolactone and metoprolol  paroxysmal a fib -- continue beta-blockers and eliquis chronic anticoagulation  Depression -- on Lexapro  morbid obesity -- complicates overall prognosis and care  Procedures: Family communication : Consults : CODE STATUS:  DVT Prophylaxis : Level of care: Progressive Cardiac Status is: Inpatient  Remains inpatient appropriate because:Inpatient level of care appropriate due to severity of illness  Dispo: The patient is from: Home              Anticipated d/c is to: Home              Patient currently is not medically stable to d/c.   Difficult to place patient No  overall improving. Hopefully discharge in 24 to 48 hours      TOTAL TIME TAKING CARE OF THIS PATIENT: 25 minutes.  >50% time spent on counselling and coordination of care  Note: This dictation was prepared with Dragon dictation along with smaller phrase technology. Any transcriptional errors that result from this process are unintentional.  Fritzi Mandes M.D    [facility]   CC: Primary care physician; Rusty Aus, MD Patient ID: Paula Torres, female   DOB: Dec 02, 1950, 70 y.o.   MRN: [id number]

## 2021-04-26 NOTE — Evaluation (Signed)
Occupational Therapy Evaluation Patient Details Name: Paula Torres MRN: [id number] DOB: [birth year] Today's Date: 04/26/2021    History of Present Illness a 70 year old female with a history of diastolic CHF as well as COPD and morbid obesity with pickwickian syndrome and obesity hypoventilation with chronic hypercapnia.  She also has a history of pulmonary hypertension, obstructive sleep apnea atrial fibrillation and essential hypertension.  She came into the hospital with complaints of acute onset dyspnea with hypoxemia   Clinical Impression   Ms Campo was seen for OT evaluation this date. Pt was MOD I in all ADL and functional mobility using 4WW, PRN assist from daughter as needed. Pt on 3-4 liters of O2 at home. Upon arrival pt in chair on 10L HFNC. Pt requires MOD I for ADL t/f using RW. MIN A for LBD standing - assist to pull sweatpants over rear. Pt toelrates ~1 min standing prior to returning to sit. SpO2 93% on 10L HFNC standing decreasing to 88% with <30 sec marching in place. Pt educated in energy conservation strategies including  activity pacing, work simplification, and falls prevention. Handout provided. Pt verbalized understanding and would benefit from additional skilled OT services to maximize recall and carryover of learned techniques and facilitate implementation of learned techniques into daily routines. Upon discharge, anticipate no OT follow up services.     Follow Up Recommendations  No OT follow up    Equipment Recommendations  None recommended by OT    Recommendations for Other Services       Precautions / Restrictions Precautions Precautions: None Restrictions Weight Bearing Restrictions: No      Mobility Bed Mobility               General bed mobility comments: pt received and left in chair    Transfers Overall transfer level: Modified independent Equipment used: Rolling walker (2 wheeled)                  Balance Overall balance  assessment: Needs assistance Sitting-balance support: No upper extremity supported;Feet supported Sitting balance-Leahy Scale: Normal     Standing balance support: Bilateral upper extremity supported Standing balance-Leahy Scale: Fair                             ADL either performed or assessed with clinical judgement   ADL Overall ADL's : Needs assistance/impaired                                       General ADL Comments: MIN A for LBD standing - assist to pull sweatpants over rear. Pt toelrates ~1 min standing prior to returning to sit. MOD I for ADL t/f using RW      Pertinent Vitals/Pain Pain Assessment: No/denies pain     Hand Dominance     Extremity/Trunk Assessment Upper Extremity Assessment Upper Extremity Assessment: Overall WFL for tasks assessed   Lower Extremity Assessment Lower Extremity Assessment: Generalized weakness       Communication Communication Communication: No difficulties   Cognition Arousal/Alertness: Awake/alert Behavior During Therapy: WFL for tasks assessed/performed Overall Cognitive Status: Within Functional Limits for tasks assessed                                     General Comments  SpO2 93%  on 10L HFNC standing decreasing to 88% with <30 sec marching in place    Exercises Exercises: Other exercises Other Exercises Other Exercises: Pt educated re: OT role, DME recs, d/c recs, falls prevention, ECS Other Exercises: LBD, sit<>stand, sitting/standing balance/tolerance, standing marching   Shoulder Instructions      Home Living Family/patient expects to be discharged to:: Private residence Living Arrangements: Spouse/significant other;Children Available Help at Discharge: Family;Available 24 hours/day Type of Home: House Home Access: Stairs to enter                     Home Equipment: [place] - 4 wheels;Bedside commode   Additional Comments: husband currently at [facility], DTR  home all the time      Prior Functioning/Environment Level of Independence: Independent with assistive device(s)        Comments: MOD I using 4WW and 3-4L O2, reports PRN light assist from daughter for ADLs 2/2 fatigue and assist for IADLs        OT Problem List: Decreased activity tolerance      OT Treatment/Interventions: Self-care/ADL training;Therapeutic exercise;Energy conservation;DME and/or AE instruction;Therapeutic activities;Patient/family education;Balance training    OT Goals(Current goals can be found in the care plan section) Acute Rehab OT Goals Patient Stated Goal: to go home OT Goal Formulation: With patient Time For Goal Achievement: 05/10/21 Potential to Achieve Goals: Good ADL Goals Pt Will Perform Lower Body Dressing: with modified independence;sit to/from stand (c LRAD PRN) Pt Will Transfer to Toilet: with modified independence;ambulating;regular height toilet (c LRAD PRN) Additional ADL Goal #1: Pt will Independently verbalize plan to implement x3 ECS  OT Frequency: Min 1X/week    AM-PAC OT "6 Clicks" Daily Activity     Outcome Measure Help from another person eating meals?: None Help from another person taking care of personal grooming?: A Little Help from another person toileting, which includes using toliet, bedpan, or urinal?: A Little Help from another person bathing (including washing, rinsing, drying)?: A Little Help from another person to put on and taking off regular upper body clothing?: None Help from another person to put on and taking off regular lower body clothing?: A Little 6 Click Score: 20   End of Session Equipment Utilized During Treatment: Rolling walker  Activity Tolerance: Patient tolerated treatment well Patient left: in chair;with call bell/phone within reach  OT Visit Diagnosis: Muscle weakness (generalized) (M62.81)                Time: 4010-2725 OT Time Calculation (min): 9 min Charges:  OT General Charges $OT Visit:  1 Visit OT Evaluation $OT Eval Low Complexity: 1 Low  Dessie Coma, M.S. OTR/L  04/26/21, 3:59 PM  ascom [phone]

## 2021-04-26 NOTE — Evaluation (Signed)
Physical Therapy Evaluation Patient Details Name: Chanin Frumkin MRN: [id number] DOB: 04/04/1951 Today's Date: 04/26/2021   History of Present Illness  70 year old female with a history of diastolic CHF as well as COPD and morbid obesity with pickwickian syndrome and obesity hypoventilation with chronic hypercapnia.  She also has a history of pulmonary hypertension, obstructive sleep apnea atrial fibrillation and essential hypertension.  She came into the hospital with complaints of acute onset dyspnea with hypoxemia  Clinical Impression  Pt alert and oriented but did have occasional times where she appeared confused t/o the session and needed extra cuing for simple tasks.  She was on 9L O2 on arrival with sats in the ,id-low 90s, she showed good effort (again typically with extra time/cuing) and was safe and confident with ambulation using walker but did not have much tolerance at all and even with multiple standing rest breaks and having 10L O2 she requested to sit after ~50 ft.  Pt will need continued PT to further address functional and tolerance limitations.     Follow Up Recommendations Home health PT;Supervision - Intermittent    Equipment Recommendations  None recommended by PT    Recommendations for Other Services       Precautions / Restrictions Precautions Precautions: Fall Restrictions Weight Bearing Restrictions: No      Mobility  Bed Mobility Overal bed mobility: Needs Assistance Bed Mobility: Supine to Sit     Supine to sit: Min guard     General bed mobility comments: Pt very slow to get to EOB, did not need assist to transition, did need assist donning shoes    Transfers Overall transfer level: Needs assistance Equipment used: Rolling walker (2 wheeled) Transfers: Sit to/from Stand Sit to Stand: Min guard;Min assist         General transfer comment: Pt made multiple attempts to get to standing without assist, repeated cues for hand placement/etc and  she couldn't organize getting up on her own from standard height bed w/o some light assist.  Did rise from recliner using arms rests w/o direct assist later in the session  Ambulation/Gait Ambulation/Gait assistance: Min guard Gait Distance (Feet): 50 Feet Assistive device: Rolling walker (2 wheeled)       General Gait Details: Pt did well with cadence, safety and balance; however she needed multiple standing rest breaks.  Sats were in the low 90s range most of the time on 10L and pt needed consistent reminders to insure full and appropriate breathing.  C/o considerable subjective fatigue, requesting to sit at ~50 ft, later able to walk another 20 ft to the bathroom again with tolerance being much more of an issue than actual gait, etc  Stairs            Wheelchair Mobility    Modified Rankin (Stroke Patients Only)       Balance Overall balance assessment: Needs assistance Sitting-balance support: No upper extremity supported;Feet supported Sitting balance-Leahy Scale: Normal     Standing balance support: Bilateral upper extremity supported Standing balance-Leahy Scale: Fair Standing balance comment: relatively good balance with b/l UEs holding walker, no LOBs                             Pertinent Vitals/Pain Pain Assessment: No/denies pain    Home Living Family/patient expects to be discharged to:: Private residence Living Arrangements: Spouse/significant other;Children Available Help at Discharge: Family;Available 24 hours/day Type of Home: House Home Access: Stairs to enter  Entrance Stairs-Number of Steps: 2   Home Equipment: [place] - 4 wheels;Bedside commode Additional Comments: husband currently at [facility], DTR home all the time    Prior Function Level of Independence: Independent with assistive device(s)         Comments: MOD I using 4WW and 3-4L O2, reports PRN light assist from daughter for ADLs 2/2 fatigue and assist for IADLs     Hand  Dominance        Extremity/Trunk Assessment   Upper Extremity Assessment Upper Extremity Assessment: Overall WFL for tasks assessed    Lower Extremity Assessment Lower Extremity Assessment: Generalized weakness       Communication   Communication: No difficulties  Cognition Arousal/Alertness: Awake/alert Behavior During Therapy: WFL for tasks assessed/performed Overall Cognitive Status: Within Functional Limits for tasks assessed                                        General Comments General comments (skin integrity, edema, etc.): O2 mid 90s on arrival on 9L, sats remain in the low 90s (on 10L) t/o most of ambulation.  Increased DOE with exercises.    Exercises Other Exercises Other Exercises: Pt educated re: OT role, DME recs, d/c recs, falls prevention, ECS Other Exercises: LBD, sit<>stand, sitting/standing balance/tolerance, standing marching   Assessment/Plan    PT Assessment Patient needs continued PT services  PT Problem List Decreased strength;Decreased range of motion;Decreased activity tolerance;Decreased mobility;Decreased safety awareness;Cardiopulmonary status limiting activity       PT Treatment Interventions DME instruction;Gait training;Stair training;Functional mobility training;Therapeutic activities;Therapeutic exercise;Balance training;Patient/family education    PT Goals (Current goals can be found in the Care Plan section)  Acute Rehab PT Goals Patient Stated Goal: to go home PT Goal Formulation: With patient Time For Goal Achievement: 05/10/21 Potential to Achieve Goals: Fair    Frequency Min 2X/week   Barriers to discharge        Co-evaluation               AM-PAC PT "6 Clicks" Mobility  Outcome Measure Help needed turning from your back to your side while in a flat bed without using bedrails?: A Little Help needed moving from lying on your back to sitting on the side of a flat bed without using bedrails?: A  Little Help needed moving to and from a bed to a chair (including a wheelchair)?: A Little Help needed standing up from a chair using your arms (e.g., wheelchair or bedside chair)?: A Little Help needed to walk in hospital room?: A Little Help needed climbing 3-5 steps with a railing? : A Little 6 Click Score: 18    End of Session Equipment Utilized During Treatment: Gait belt Activity Tolerance: Patient limited by fatigue Patient left: with chair alarm set;with call bell/phone within reach Nurse Communication: Mobility status PT Visit Diagnosis: Muscle weakness (generalized) (M62.81);Difficulty in walking, not elsewhere classified (R26.2)    Time: 2341-4436 PT Time Calculation (min) (ACUTE ONLY): 29 min   Charges:   PT Evaluation $PT Eval Low Complexity: 1 Low PT Treatments $Gait Training: 8-22 mins        Kreg Shropshire, DPT 04/26/2021, 4:39 PM

## 2021-04-26 NOTE — Progress Notes (Signed)
Patient's Co2  is 82 and PH is 7.41. Notified MD

## 2021-04-26 NOTE — Consult Note (Signed)
Heart Failure Nurse Navigator Note'   HFpEF 60 to 65%.  She presented to the emergency room respiratory distress, Patient's pulse oximetry was noted to be in the 60s.  Comorbidities:  Morbid obesity COPD Hypertension Atrial fibrillation Severe pulmonary hypertension  Medications:  Apixaban 5 mg 2 times a day atorvastatin 20 mg daily Bisoprolol 5 mg daily Furosemide 40 mg 2 times a day Spironolactone 25 mg daily  Labs:  Sodium 136, potassium 5.1, chloride 85, CO2 42, BUN 27 up from 17 yesterday, creatinine 1.25 up from 1.11 of yesterday Weight is 131.3 kg Blood pressure 105/56  Assessment:  General-She is awake and alert lying in bed having just finished breakfast.  HEENT pupils are equal, able to evaluate for JVD due to body habitus  Cardiac-heart tones of regular rate and rhythm.  Chest-breath sounds with fine crackles in the bases.  Abdomen obese rounded soft nontender  Lower extremities-no edema noted, skin is wrinkled.  Psych-is pleasant and appropriate makes good eye contact  Neurologic-speech is clear moves all extremities without difficulty.   Initial meeting with patient.  She follows with Darylene Price in the outpatient setting.  She has a scale but does not weigh herself daily at home.  Stressed the importance of daily weights and recording with reporting weight increase to physicians to aid in staying out of the hospital.  She admits to using salt at the table, discussed removing the saltshaker.  Also discussed diet and what she eats, she states that she can use canned vegetables but did not drain and rinse the vegetables.  Instructed on that procedure and then to cook in plain water.  Will continue to follow.  Pricilla Riffle RN [facility]

## 2021-04-27 LAB — URINE CULTURE: Culture: >=100000 — AB

## 2021-04-27 LAB — BLOOD GAS, VENOUS
Acid-Base Excess: 24.9 mmol/L — ABNORMAL HIGH (ref 0.0–2.0)
Bicarbonate: 53.7 mmol/L — ABNORMAL HIGH (ref 20.0–28.0)
O2 Saturation: 92.8 %
Patient temperature: 37
pCO2, Ven: 79 mmHg (ref 44.0–60.0)
pH, Ven: 7.44 — ABNORMAL HIGH (ref 7.250–7.430)
pO2, Ven: 64 mmHg — ABNORMAL HIGH (ref 32.0–45.0)

## 2021-04-27 MED ORDER — CIPROFLOXACIN HCL 500 MG PO TABS
500.0000 mg | ORAL_TABLET | Freq: Two times a day (BID) | ORAL | Status: DC
  Administered 2021-04-27: 500 mg via ORAL
  Filled 2021-04-27 (×2): qty 1

## 2021-04-27 MED ORDER — FUROSEMIDE 40 MG PO TABS: 80.0000 mg | ORAL_TABLET | Freq: Every day | ORAL | 0 refills | Status: DC

## 2021-04-27 MED ORDER — CIPROFLOXACIN HCL 500 MG PO TABS: 500.0000 mg | ORAL_TABLET | Freq: Two times a day (BID) | ORAL | 0 refills | Status: AC

## 2021-04-27 NOTE — Progress Notes (Signed)
This RN provided discharge instructions and teaching to the patient and the patient's daugther, Paula Torres. Both the patient and the patient's daughter verbalized and demonstrated understanding of the provided instructions. R and L arm PIVs removed. Both cannulas intact. Pt tolerated well. All belongings packed and in tow. Caryl Comes, [place] transported patient via wheelchair to private vehicle.

## 2021-04-27 NOTE — Progress Notes (Signed)
Pulmonary Medicine          Date: 04/27/2021,   MRN# [id number] Jennife Zaucha [birth year]     AdmissionWeight: 121 kg                 CurrentWeight: 129.8 kg   Referring physician: Dr. Francine Graven   CHIEF COMPLAINT:   Acute on chronic hypercapnic and hypoxemic respiratory failure   HISTORY OF PRESENT ILLNESS   Is a 70 year old female with a history of diastolic CHF as well as COPD and morbid obesity with pickwickian syndrome and obesity hypoventilation with chronic hypercapnia.  She also has a history of pulmonary hypertension, obstructive sleep apnea atrial fibrillation and essential hypertension.  She came into the hospital with complaints of acute onset dyspnea with hypoxemia.  Daughter was able to respond and was able to increase oxygen therapy as well as call EMS due to respiratory failure.  She was placed on BiPAP during my evaluation.  Settings on her BiPAP showed tidal volume in the 350s with inspiratory pressure of 18 cm of water over expiratory pressure of 10 with a rate of 20 and FiO2 of 50%.  At baseline patient uses 4 L/min nasal cannula.  Blood work on arrival showed that she was normoxic on the settings on BiPAP with pH of 7.33 however CO2 was at 91 and PaO2 at 94.  Pulmonary consultation for further evaluation and management of complex comorbid pulmonary patient.  04/26/21- patient is singifincantly improved. Shes lucid speaking in full sentences.  Will start PT/OT today and ok to start DC planning for next 24-48h.   04/27/21- patient is improved , she feels close to baseline and is lucid able to speak in full sentences with non labored breathing pattern and without encephalopathy .  Plan to dc home with outpatient follow up.   PAST MEDICAL HISTORY   Past Medical History:  Diagnosis Date  . (HFpEF) heart failure with preserved ejection fraction ([place])   . Chronic respiratory failure with hypoxia ([place])   . COPD (chronic obstructive pulmonary disease) ([place])   .  Coronary artery disease, non-occlusive   . Hypertension   . OSA (obstructive sleep apnea)   . PAF (paroxysmal atrial fibrillation) ([place])   . Pulmonary hypertension ([place])      SURGICAL HISTORY   Past Surgical History:  Procedure Laterality Date  . NO PAST SURGERIES    . RIGHT/LEFT HEART CATH AND CORONARY ANGIOGRAPHY Left 12/21/2019   Procedure: RIGHT/LEFT HEART CATH AND CORONARY ANGIOGRAPHY;  Surgeon: Wellington Hampshire, MD;  Location: [facility];  Service: Cardiovascular;  Laterality: Left;     FAMILY HISTORY   Family History  Problem Relation Age of Onset  . Hypertension Other      SOCIAL HISTORY   Social History   Tobacco Use  . Smoking status: Former    Types: Cigarettes    Quit date: 06/03/2017    Years since quitting: 3.9  . Smokeless tobacco: Never  Vaping Use  . Vaping Use: Never used  Substance Use Topics  . Alcohol use: Not Currently  . Drug use: Never     MEDICATIONS    Home Medication:  REM   Current Medication:  Current Facility-Administered Medications:  .  0.9 %  sodium chloride infusion, 250 mL, Intravenous, PRN, Agbata, Tochukwu, MD .  acetaminophen (TYLENOL) tablet 650 mg, 650 mg, Oral, Q6H PRN **OR** acetaminophen (TYLENOL) suppository 650 mg, 650 mg, Rectal, Q6H PRN, Agbata, Tochukwu, MD .  apixaban (ELIQUIS)  tablet 5 mg, 5 mg, Oral, BID, Agbata, Tochukwu, MD, 5 mg at 04/27/21 1026 .  atorvastatin (LIPITOR) tablet 20 mg, 20 mg, Oral, Daily, Agbata, Tochukwu, MD, 20 mg at 04/27/21 1027 .  bisoprolol (ZEBETA) tablet 5 mg, 5 mg, Oral, Daily, Agbata, Tochukwu, MD, 5 mg at 04/27/21 1027 .  cephALEXin (KEFLEX) capsule 500 mg, 500 mg, Oral, Q6H, Fritzi Mandes, MD, 500 mg at 04/27/21 0516 .  escitalopram (LEXAPRO) tablet 20 mg, 20 mg, Oral, Daily, Agbata, Tochukwu, MD, 20 mg at 04/26/21 1436 .  ferrous gluconate (FERGON) tablet 324 mg, 324 mg, Oral, q morning, Agbata, Tochukwu, MD, 324 mg at 04/27/21 1027 .  fluticasone furoate-vilanterol  (BREO ELLIPTA) 100-25 MCG/INH 1 puff, 1 puff, Inhalation, Daily, 1 puff at 04/27/21 1029 **AND** umeclidinium bromide (INCRUSE ELLIPTA) 62.5 MCG/INH 1 puff, 1 puff, Inhalation, Daily, Darnelle Bos, RPH, 1 puff at 04/27/21 1028 .  furosemide (LASIX) injection 40 mg, 40 mg, Intravenous, BID, Agbata, Tochukwu, MD, 40 mg at 04/27/21 0953 .  ipratropium-albuterol (DUONEB) 0.5-2.5 (3) MG/3ML nebulizer solution 3 mL, 3 mL, Nebulization, Q6H PRN, Agbata, Tochukwu, MD, 3 mL at 04/26/21 2112 .  montelukast (SINGULAIR) tablet 10 mg, 10 mg, Oral, QHS, Agbata, Tochukwu, MD, 10 mg at 04/26/21 2228 .  ondansetron (ZOFRAN) tablet 4 mg, 4 mg, Oral, Q6H PRN **OR** ondansetron (ZOFRAN) injection 4 mg, 4 mg, Intravenous, Q6H PRN, Agbata, Tochukwu, MD .  pantoprazole (PROTONIX) EC tablet 40 mg, 40 mg, Oral, Daily, Agbata, Tochukwu, MD, 40 mg at 04/27/21 1026 .  [COMPLETED] methylPREDNISolone sodium succinate (SOLU-MEDROL) 40 mg/mL injection 40 mg, 40 mg, Intravenous, Q12H, 40 mg at 04/26/21 0537 **FOLLOWED BY** predniSONE (DELTASONE) tablet 40 mg, 40 mg, Oral, Q breakfast, Agbata, Tochukwu, MD, 40 mg at 04/27/21 1025 .  sodium chloride flush (NS) 0.9 % injection 3 mL, 3 mL, Intravenous, Q12H, Agbata, Tochukwu, MD, 3 mL at 04/27/21 0954 .  sodium chloride flush (NS) 0.9 % injection 3 mL, 3 mL, Intravenous, PRN, Agbata, Tochukwu, MD .  spironolactone (ALDACTONE) tablet 25 mg, 25 mg, Oral, Daily, Agbata, Tochukwu, MD, 25 mg at 04/27/21 1026    ALLERGIES   Codeine and Sulfa antibiotics     REVIEW OF SYSTEMS    Review of Systems:  Gen:  Denies  fever, sweats, chills weigh loss  HEENT: Denies blurred vision, double vision, ear pain, eye pain, hearing loss, nose bleeds, sore throat Cardiac:  No dizziness, chest pain or heaviness, chest tightness,edema Resp:   Denies cough or sputum porduction, shortness of breath,wheezing, hemoptysis,  Gi: Denies swallowing difficulty, stomach pain, nausea or vomiting, diarrhea,  constipation, bowel incontinence Gu:  Denies bladder incontinence, burning urine Ext:   Denies Joint pain, stiffness or swelling Skin: Denies  skin rash, easy bruising or bleeding or hives Endoc:  Denies polyuria, polydipsia , polyphagia or weight change Psych:   Denies depression, insomnia or hallucinations   Other:  All other systems negative   VS: BP (!) 125/59 (BP Location: Right Arm)   Pulse (!) 56   Temp 98 F (36.7 C)   Resp 17   Ht _0  (1.651 m)   Wt 129.8 kg   SpO2 98%   BMI 47.62 kg/m      PHYSICAL EXAM    GENERAL:NAD, no fevers, chills, no weakness no fatigue HEAD: Normocephalic, atraumatic.  EYES: Pupils equal, round, reactive to light. Extraocular muscles intact. No scleral icterus.  MOUTH: Moist mucosal membrane. Dentition intact. No abscess noted.  EAR, NOSE, THROAT: Clear  without exudates. No external lesions.  NECK: Supple. No thyromegaly. No nodules. No JVD.  PULMONARY: mild crackles bilaterally at bases  CARDIOVASCULAR: S1 and S2. Regular rate and rhythm. No murmurs, rubs, or gallops. No edema. Pedal pulses 2+ bilaterally.  GASTROINTESTINAL: Soft, nontender, nondistended. No masses. Positive bowel sounds. No hepatosplenomegaly.  MUSCULOSKELETAL: No swelling, clubbing, or edema. Range of motion full in all extremities.  NEUROLOGIC: Cranial nerves II through XII are intact. No gross focal neurological deficits. Sensation intact. Reflexes intact.  SKIN: No ulceration, lesions, rashes, or cyanosis. Skin warm and dry. Turgor intact.  PSYCHIATRIC: Mood, affect within normal limits. The patient is awake, alert and oriented x 3. Insight, judgment intact.       IMAGING    CT HEAD WO CONTRAST (5MM)  Result Date: 04/25/2021 CLINICAL DATA:  70 year old female with history of mental status change. Shortness of breath. EXAM: CT HEAD WITHOUT CONTRAST TECHNIQUE: Contiguous axial images were obtained from the base of the skull through the vertex without intravenous  contrast. COMPARISON:  None. FINDINGS: Brain: Patchy and confluent areas of decreased attenuation are noted throughout the deep and periventricular white matter of the cerebral hemispheres bilaterally, compatible with chronic microvascular ischemic disease. No evidence of acute infarction, hemorrhage, hydrocephalus, extra-axial collection or mass lesion/mass effect. Vascular: No hyperdense vessel or unexpected calcification. Skull: Normal. Negative for fracture or focal lesion. Sinuses/Orbits: No acute finding. Other: None. IMPRESSION: 1. No acute intracranial abnormalities. 2. Chronic microvascular ischemic changes in the cerebral white matter, as above. Electronically Signed   By: Vinnie Langton M.D.   On: 04/25/2021 06:53   CT Angio Chest Pulmonary Embolism (PE) W or WO Contrast  Result Date: 04/25/2021 CLINICAL DATA:  70 year old female with shortness of breath, wheezing. On home oxygen. EXAM: CT ANGIOGRAPHY CHEST WITH CONTRAST TECHNIQUE: Multidetector CT imaging of the chest was performed using the standard protocol during bolus administration of intravenous contrast. Multiplanar CT image reconstructions and MIPs were obtained to evaluate the vascular anatomy. CONTRAST:  116m OMNIPAQUE IOHEXOL 350 MG/ML SOLN COMPARISON:  Portable chest earlier today.  Chest CTA 11/08/2019. FINDINGS: Cardiovascular: Adequate contrast bolus timing in the pulmonary arterial tree. Respiratory motion, most pronounced in the lower lobe basal segments. No convincing filling defect identified in the pulmonary arteries to suggest acute pulmonary embolism. Mild cardiomegaly. No pericardial effusion. Calcified aortic atherosclerosis. Calcified coronary artery atherosclerosis and/or stents. Mediastinum/Nodes: Negative.  No mediastinal lymphadenopathy. Lungs/Pleura: Lower lung volumes compared to last year with atelectatic changes to the trachea and major airways. Atelectasis in the lingula. Mild dependent and peribronchial opacity  in both lower lobes more resembles atelectasis than infection. No pleural effusion. No other confluent pulmonary opacity. Centrilobular emphysema was more apparent on the CTA last year. There is mild septal thickening in the upper lobes today. Upper Abdomen: Negative visible liver, spleen, adrenal glands. Chronic left renal upper pole cysts. Decompressed visible stomach. Musculoskeletal: Ankylosis in the thoracic spine related to chronic flowing endplate osteophytes. No acute osseous abnormality identified. Review of the MIP images confirms the above findings. IMPRESSION: 1. Mild respiratory motion but no convincing pulmonary embolus. 2. Emphysema (ICD10-J43.9) with lower lung volumes than the CTA last year. Patchy lower lobe opacity more resembles atelectasis than infection. Mild septal thickening in the upper lobes without overt edema. No pleural effusion. 3. Mild cardiomegaly. Calcified coronary artery and Aortic Atherosclerosis (ICD10-I70.0). Electronically Signed   By: HGenevie AnnM.D.   On: 04/25/2021 09:41   DG Chest Port 1 View  Result Date: 04/26/2021 CLINICAL  DATA:  Pulmonary edema. EXAM: PORTABLE CHEST 1 VIEW COMPARISON:  April 25, 2021. FINDINGS: Stable cardiomegaly with central pulmonary vascular congestion. Mild bibasilar pulmonary edema may be present. No pneumothorax or pleural effusion is noted. Bony thorax is unremarkable. IMPRESSION: Stable cardiomegaly with central pulmonary vascular congestion and possible mild bibasilar pulmonary edema. Electronically Signed   By: Marijo Conception M.D.   On: 04/26/2021 10:55   DG Chest Portable 1 View  Result Date: 04/25/2021 CLINICAL DATA:  70 year old female with shortness of breath, wheezing, pulse ox in the 80s on room air. EXAM: PORTABLE CHEST 1 VIEW COMPARISON:  CT chest 06/15/2020 and earlier. FINDINGS: Portable AP upright view at 0534 hours. Stable cardiomegaly and mediastinal contours. Stable lung volumes. Visualized tracheal air column is within  normal limits. Chronic increased pulmonary interstitial opacity, not significantly changed from several portable radiographs last year. No pneumothorax, pleural effusion or consolidation. Chronic right lateral rib fractures. No acute osseous abnormality identified. Paucity of bowel gas in the upper abdomen. IMPRESSION: Cardiomegaly and increased pulmonary interstitial opacity appears stable from last year. But consider acute interstitial edema. Electronically Signed   By: Genevie Ann M.D.   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  Total face to face encounter time for this patient visit was 61mn. >50% of the time was  spent in counseling and coordination of care.   Patient/Family are satisfied with care plan and all questions have been answered.  This document was prepared using Dragon voice recognition software and may include unintentional dictation errors.     FOttie Glazier M.D.  Division of [facility]

## 2021-04-27 NOTE — Progress Notes (Signed)
Decreased Bipap settings from 18/10 to 14/7 due to patient complaining the Bipap was blowing too much air.  Patient appears to be tolerating news bipap settings.

## 2021-04-27 NOTE — Plan of Care (Signed)
Problem: Education: Goal: Knowledge of General Education information will improve Description: Including pain rating scale, medication(s)/side effects and non-pharmacologic comfort measures 04/27/2021 1302 by Cristela Blue, RN Outcome: Progressing 04/27/2021 1302 by Cristela Blue, RN Outcome: Progressing   Problem: Health Behavior/Discharge Planning: Goal: Ability to manage health-related needs will improve 04/27/2021 1302 by Cristela Blue, RN Outcome: Progressing 04/27/2021 1302 by Cristela Blue, RN Outcome: Progressing   Problem: Clinical Measurements: Goal: Ability to maintain clinical measurements within normal limits will improve 04/27/2021 1302 by Cristela Blue, RN Outcome: Progressing 04/27/2021 1302 by Cristela Blue, RN Outcome: Progressing Goal: Will remain free from infection 04/27/2021 1302 by Cristela Blue, RN Outcome: Progressing 04/27/2021 1302 by Cristela Blue, RN Outcome: Progressing Goal: Diagnostic test results will improve 04/27/2021 1302 by Cristela Blue, RN Outcome: Progressing 04/27/2021 1302 by Cristela Blue, RN Outcome: Progressing Goal: Respiratory complications will improve 04/27/2021 1302 by Cristela Blue, RN Outcome: Progressing 04/27/2021 1302 by Cristela Blue, RN Outcome: Progressing Goal: Cardiovascular complication will be avoided 04/27/2021 1302 by Cristela Blue, RN Outcome: Progressing 04/27/2021 1302 by Cristela Blue, RN Outcome: Progressing   Problem: Activity: Goal: Risk for activity intolerance will decrease 04/27/2021 1302 by Cristela Blue, RN Outcome: Progressing 04/27/2021 1302 by Cristela Blue, RN Outcome: Progressing   Problem: Nutrition: Goal: Adequate nutrition will be maintained 04/27/2021 1302 by Cristela Blue, RN Outcome: Progressing 04/27/2021 1302 by Cristela Blue, RN Outcome: Progressing   Problem: Coping: Goal: Level of anxiety will decrease 04/27/2021 1302 by Cristela Blue, RN Outcome:  Progressing 04/27/2021 1302 by Cristela Blue, RN Outcome: Progressing   Problem: Elimination: Goal: Will not experience complications related to bowel motility 04/27/2021 1302 by Cristela Blue, RN Outcome: Progressing 04/27/2021 1302 by Cristela Blue, RN Outcome: Progressing Goal: Will not experience complications related to urinary retention 04/27/2021 1302 by Cristela Blue, RN Outcome: Progressing 04/27/2021 1302 by Cristela Blue, RN Outcome: Progressing   Problem: Pain Managment: Goal: General experience of comfort will improve 04/27/2021 1302 by Cristela Blue, RN Outcome: Progressing 04/27/2021 1302 by Cristela Blue, RN Outcome: Progressing   Problem: Safety: Goal: Ability to remain free from injury will improve 04/27/2021 1302 by Cristela Blue, RN Outcome: Progressing 04/27/2021 1302 by Cristela Blue, RN Outcome: Progressing   Problem: Skin Integrity: Goal: Risk for impaired skin integrity will decrease 04/27/2021 1302 by Cristela Blue, RN Outcome: Progressing 04/27/2021 1302 by Cristela Blue, RN Outcome: Progressing   Problem: Education: Goal: Knowledge of disease or condition will improve 04/27/2021 1302 by Cristela Blue, RN Outcome: Progressing 04/27/2021 1302 by Cristela Blue, RN Outcome: Progressing Goal: Knowledge of the prescribed therapeutic regimen will improve 04/27/2021 1302 by Cristela Blue, RN Outcome: Progressing 04/27/2021 1302 by Cristela Blue, RN Outcome: Progressing Goal: Individualized Educational Video(s) 04/27/2021 1302 by Cristela Blue, RN Outcome: Progressing 04/27/2021 1302 by Cristela Blue, RN Outcome: Progressing   Problem: Activity: Goal: Ability to tolerate increased activity will improve 04/27/2021 1302 by Cristela Blue, RN Outcome: Progressing 04/27/2021 1302 by Cristela Blue, RN Outcome: Progressing Goal: Will verbalize the importance of balancing activity with adequate rest periods 04/27/2021 1302 by Cristela Blue,  RN Outcome: Progressing 04/27/2021 1302 by Cristela Blue, RN Outcome: Progressing   Problem: Respiratory: Goal: Ability to maintain a clear airway will improve 04/27/2021 1302 by Cristela Blue, RN Outcome: Progressing 04/27/2021 1302 by Cristela Blue, RN Outcome: Progressing Goal: Levels of oxygenation will improve 04/27/2021 1302 by Cristela Blue, RN Outcome: Progressing 04/27/2021 1302 by Cristela Blue, RN Outcome: Progressing Goal: Ability to maintain adequate ventilation will improve 04/27/2021 1302 by Cristela Blue,  RN Outcome: Progressing 04/27/2021 1302 by Cristela Blue, RN Outcome: Progressing

## 2021-04-27 NOTE — Discharge Summary (Signed)
[place] [facility] [place] NAME: Paula Torres    MR#:  [id number]  DATE OF BIRTH:  Oct 17, 1950  DATE OF ADMISSION:  04/25/2021 ADMITTING PHYSICIAN: Collier Bullock, MD  DATE OF DISCHARGE: 04/27/2021  PRIMARY CARE PHYSICIAN: Rusty Aus, MD    ADMISSION DIAGNOSIS:  Respiratory acidosis [E87.2] COPD exacerbation (HCC) [J44.1] Acute UTI [N39.0] Acute on chronic respiratory failure (HCC) [J96.20] Acute respiratory failure with hypoxia and hypercapnia (HCC) [J96.01, J96.02] Acute on chronic congestive heart failure, unspecified heart failure type ([place]) [I50.9]  DISCHARGE DIAGNOSIS:  acute on chronic respiratory failure with hypoxia and hypercapnia secondary to COPD and CHF exacerbation chronic home oxygen use obesity hypoventilation syndrome  SECONDARY DIAGNOSIS:   Past Medical History:  Diagnosis Date  . (HFpEF) heart failure with preserved ejection fraction ([place])   . Chronic respiratory failure with hypoxia ([place])   . COPD (chronic obstructive pulmonary disease) ([place])   . Coronary artery disease, non-occlusive   . Hypertension   . OSA (obstructive sleep apnea)   . PAF (paroxysmal atrial fibrillation) ([place])   . Pulmonary hypertension [facility])     HOSPITAL COURSE:   Paula Torres is a 70 y.o. Caucasian female with medical history significant for morbid obesity (BMI 44.39 kg/m), chronic diastolic CHF, COPD with chronic respiratory failure on 3 - 4 L oxygen continuous/trilogy at night, hypertension, atrial fibrillation and severe pulmonary hypertension who presented to the emergency room via EMS for evaluation of worsening shortness of breath from her baseline, wheezing and hypoxia.   acute on chronic respiratory failure with hypoxia and hypercapnia likely secondary to COPD and CHF exacerbation -- at baseline patient wears 3 to 4 L of oxygen -- patient has trilogy at home-- recommended to use at least 2 to 3 times a day. -- currently off  BiPAP. Appears lucid -- continue oral steroid and IV Lasix-- change to PO Lasix and daily steroid -- mentation is at baseline -- VPG look stable. Patient is chronic CO2 retainer. Seen by pulmonary Dr.aleskerov and okay to discharge   acute on chronic diastolic congestive heart failure -- Echo showed EF of 60 to 65% with pulmonary hypertension -- continue Lasix -- continue spironolactone and metoprolol   paroxysmal a fib -- continue beta-blockers and eliquis chronic anticoagulation   Depression -- on Lexapro   morbid obesity/obesity hypoventilation syndrome -- complicates overall prognosis and care -- patient advised to continue use her trilogy at home  patient is at a very high risk for cardiorespiratory arrest and high risk for readmission given her comorbidity   Procedures: Family communication : daughter Adonis Brook Consults : pulmonary CODE STATUS: full DVT Prophylaxis : eliquis Level of care: Progressive Cardiac Status is: Inpatient   Dispo: The patient is from: Home              Anticipated d/c is to: Home              Patient currently is not medically stable to d/c.              Difficult to place patient No CONSULTS OBTAINED:  Treatment Team:  Ottie Glazier, MD  DRUG ALLERGIES:   Allergies  Allergen Reactions  . Codeine     Back Headache  . Sulfa Antibiotics     Reported to pt from mother who is no longer living -  Details unknown    DISCHARGE MEDICATIONS:   Allergies as of 04/27/2021       Reactions   Codeine  Back Headache   Sulfa Antibiotics    Reported to pt from mother who is no longer living -  Details unknown        Medication List     STOP taking these medications    azithromycin 250 MG tablet Commonly known as: ZITHROMAX   fluticasone 50 MCG/ACT nasal spray Commonly known as: FLONASE   guaiFENesin 600 MG 12 hr tablet Commonly known as: MUCINEX       TAKE these medications    albuterol 108 (90 Base) MCG/ACT  inhaler Commonly known as: VENTOLIN HFA Inhale 2 puffs into the lungs every 6 (six) hours as needed for wheezing or shortness of breath.   atorvastatin 20 MG tablet Commonly known as: LIPITOR Take 1 tablet (20 mg total) by mouth daily at 6 PM.   bisoprolol 5 MG tablet Commonly known as: ZEBETA Take 5 mg by mouth daily.   ciprofloxacin 500 MG tablet Commonly known as: CIPRO Take 1 tablet (500 mg total) by mouth every 12 (twelve) hours for 5 days.   Eliquis 5 MG Tabs tablet Generic drug: apixaban TAKE 1 TABLET(5 MG) BY MOUTH TWICE DAILY   escitalopram 20 MG tablet Commonly known as: LEXAPRO Take 20 mg by mouth daily.   esomeprazole 20 MG capsule Commonly known as: NEXIUM Take 20 mg by mouth daily at 12 noon.   ferrous gluconate 324 MG tablet Commonly known as: FERGON Take 324 mg by mouth every morning.   furosemide 40 MG tablet Commonly known as: LASIX Take 2 tablets (80 mg total) by mouth daily.   ipratropium 0.06 % nasal spray Commonly known as: ATROVENT Place 2 sprays into both nostrils 3 (three) times daily as needed.   ipratropium-albuterol 0.5-2.5 (3) MG/3ML Soln Commonly known as: DUONEB Take 3 mLs by nebulization every 6 (six) hours as needed (for wheezing/shortness of breath.).   Combivent Respimat 20-100 MCG/ACT Aers respimat Generic drug: Ipratropium-Albuterol Inhale 1 puff into the lungs every 6 (six) hours as needed (wheezing/shortness of breath.).   loratadine 10 MG tablet Commonly known as: CLARITIN Take 1 tablet (10 mg total) by mouth daily.   montelukast 10 MG tablet Commonly known as: SINGULAIR Take 10 mg by mouth at bedtime.   predniSONE 20 MG tablet Commonly known as: DELTASONE Take 20 mg by mouth daily with breakfast.   spironolactone 25 MG tablet Commonly known as: ALDACTONE Take 1 tablet (25 mg total) by mouth daily. What changed: how much to take   Trelegy Ellipta 100-62.5-25 MCG/INH Aepb Generic drug:  Fluticasone-Umeclidin-Vilant Inhale 1 puff into the lungs daily.   Vitamin D (Ergocalciferol) 1.25 MG (50000 UNIT) Caps capsule Commonly known as: DRISDOL Take 50,000 Units by mouth once a week.        If you experience worsening of your admission symptoms, develop shortness of breath, life threatening emergency, suicidal or homicidal thoughts you must seek medical attention immediately by calling 911 or calling your MD immediately  if symptoms less severe.  You Must read complete instructions/literature along with all the possible adverse reactions/side effects for all the Medicines you take and that have been prescribed to you. Take any new Medicines after you have completely understood and accept all the possible adverse reactions/side effects.   Please note  You were cared for by a hospitalist during your hospital stay. If you have any questions about your discharge medications or the care you received while you were in the hospital after you are discharged, you can call the unit and asked  to speak with the hospitalist on call if the hospitalist that took care of you is not available. Once you are discharged, your primary care physician will handle any further medical issues. Please note that NO REFILLS for any discharge medications will be authorized once you are discharged, as it is imperative that you return to your primary care physician (or establish a relationship with a primary care physician if you do not have one) for your aftercare needs so that they can reassess your need for medications and monitor your lab values. Today   SUBJECTIVE  sitting out in the chair eating her meals   VITAL SIGNS:  Blood pressure (!) 124/49, pulse (!) 57, temperature (!) 97.1 F (36.2 C), resp. rate 19, height _0  (1.651 m), weight 129.8 kg, SpO2 (!) 88 %.  I/O:   Intake/Output Summary (Last 24 hours) at 04/27/2021 1512 Last data filed at 04/27/2021 1419 Gross per 24 hour  Intake 600 ml   Output 1850 ml  Net -1250 ml    PHYSICAL EXAMINATION:  GENERAL:  70 y.o.-year-old patient lying in the bed with no acute distress. Eugenio Hoes LUNGS: Normal breath sounds bilaterally, no wheezing, rales,rhonchi or crepitation. No use of accessory muscles of respiration.  CARDIOVASCULAR: S1, S2 normal. No murmurs, rubs, or gallops.  ABDOMEN: Soft, non-tender, non-distended. Bowel sounds present. No organomegaly or mass.  EXTREMITIES: No pedal edema, cyanosis, or clubbing. Puffy lower extremity NEUROLOGIC: Cranial nerves II through XII are intact. Muscle strength 5/5 in all extremities. Sensation intact. Gait not checked.  PSYCHIATRIC: The patient is alert and oriented x 3.  SKIN: No obvious rash, lesion, or ulcer.   DATA REVIEW:   CBC  Recent Labs  Lab 04/26/21 0520  WBC 13.6*  HGB 10.3*  HCT 34.0*  PLT 337    Chemistries  Recent Labs  Lab 04/25/21 0515 04/26/21 0520  NA 137 136  K 5.0 5.1  CL 88* 85*  CO2 40* 42*  GLUCOSE 169* 119*  BUN 17 27*  CREATININE 1.11* 1.25*  CALCIUM 9.1 9.2  AST 10*  --   ALT 9  --   ALKPHOS 108  --   BILITOT 0.6  --     Microbiology Results   Recent Results (from the past 240 hour(s))  Resp Panel by RT-PCR (Flu A&B, Covid) Nasopharyngeal Swab     Status: None   Collection Time: 04/25/21  5:15 AM   Specimen: Nasopharyngeal Swab; Nasopharyngeal(NP) swabs in vial transport medium  Result Value Ref Range Status   SARS Coronavirus 2 by RT PCR NEGATIVE NEGATIVE Final    Comment: (NOTE) SARS-CoV-2 target nucleic acids are NOT DETECTED.  The SARS-CoV-2 RNA is generally detectable in upper respiratory specimens during the acute phase of infection. The lowest concentration of SARS-CoV-2 viral copies this assay can detect is 138 copies/mL. A negative result does not preclude SARS-Cov-2 infection and should not be used as the sole basis for treatment or other patient management decisions. A negative result may occur with  improper specimen  collection/handling, submission of specimen other than nasopharyngeal swab, presence of viral mutation(s) within the areas targeted by this assay, and inadequate number of viral copies(<138 copies/mL). A negative result must be combined with clinical observations, patient history, and epidemiological information. The expected result is Negative.  Fact Sheet for Patients:  EntrepreneurPulse.com.au  Fact Sheet for Healthcare Providers:  IncredibleEmployment.be  This test is no t yet approved or cleared by the [place] and  has been authorized for  detection and/or diagnosis of SARS-CoV-2 by FDA under an Emergency Use Authorization (EUA). This EUA will remain  in effect (meaning this test can be used) for the duration of the COVID-19 declaration under Section 564(b)(1) of the Act, 21 U.S.C.section 360bbb-3(b)(1), unless the authorization is terminated  or revoked sooner.       Influenza A by PCR NEGATIVE NEGATIVE Final   Influenza B by PCR NEGATIVE NEGATIVE Final    Comment: (NOTE) The Xpert Xpress SARS-CoV-2/FLU/RSV plus assay is intended as an aid in the diagnosis of influenza from Nasopharyngeal swab specimens and should not be used as a sole basis for treatment. Nasal washings and aspirates are unacceptable for Xpert Xpress SARS-CoV-2/FLU/RSV testing.  Fact Sheet for Patients: EntrepreneurPulse.com.au  Fact Sheet for Healthcare Providers: IncredibleEmployment.be  This test is not yet approved or cleared by the [place] FDA and has been authorized for detection and/or diagnosis of SARS-CoV-2 by FDA under an Emergency Use Authorization (EUA). This EUA will remain in effect (meaning this test can be used) for the duration of the COVID-19 declaration under Section 564(b)(1) of the Act, 21 U.S.C. section 360bbb-3(b)(1), unless the authorization is terminated or revoked.  Performed at [facility], [address], [place], [place] [address]   Urine Culture     Status: Abnormal   Collection Time: 04/25/21  5:23 AM   Specimen: In/Out Cath Urine  Result Value Ref Range Status   Specimen Description   Final    IN/OUT CATH URINE Performed at [facility], [address], [place], [place] [address]    Special Requests   Final    NONE Performed at [facility], [address] [place], [place] [address]    Culture (A)  Final    >=100,000 COLONIES/mL ESCHERICHIA COLI Confirmed Extended Spectrum Beta-Lactamase Producer (ESBL).  In bloodstream infections from ESBL organisms, carbapenems are preferred over piperacillin/tazobactam. They are shown to have a lower risk of mortality.    Report Status 04/27/2021 FINAL  Final   Organism ID, Bacteria ESCHERICHIA COLI (A)  Final      Susceptibility   Escherichia coli - MIC*    AMPICILLIN >=32 RESISTANT Resistant     CEFAZOLIN RESISTANT Resistant     CEFEPIME <=0.12 SENSITIVE Sensitive     CEFTRIAXONE 8 RESISTANT Resistant     CIPROFLOXACIN <=0.25 SENSITIVE Sensitive     GENTAMICIN <=1 SENSITIVE Sensitive     IMIPENEM <=0.25 SENSITIVE Sensitive     NITROFURANTOIN <=16 SENSITIVE Sensitive     TRIMETH/SULFA >=320 RESISTANT Resistant     AMPICILLIN/SULBACTAM 16 INTERMEDIATE Intermediate     PIP/TAZO <=4 SENSITIVE Sensitive     * >=100,000 COLONIES/mL ESCHERICHIA COLI  Culture, blood (Routine X 2) w Reflex to ID Panel     Status: None (Preliminary result)   Collection Time: 04/25/21  6:56 AM   Specimen: BLOOD  Result Value Ref Range Status   Specimen Description BLOOD RIGHT [facility]  Final   Special Requests   Final    BOTTLES DRAWN AEROBIC AND ANAEROBIC Blood Culture adequate volume   Culture   Final    NO GROWTH 2 DAYS Performed at [facility], [address] [place], [place] [address]    Report Status PENDING  Incomplete  Culture, blood (Routine X 2) w Reflex to ID Panel     Status: None  (Preliminary result)   Collection Time: 04/25/21  6:56 AM   Specimen: BLOOD  Result Value Ref Range Status  Specimen Description BLOOD LEFT [facility]  Final   Special Requests   Final    BOTTLES DRAWN AEROBIC AND ANAEROBIC Blood Culture adequate volume   Culture   Final    NO GROWTH 2 DAYS Performed at [facility], [address], [place], [place] [address]    Report Status PENDING  Incomplete    RADIOLOGY:  DG Chest Port 1 View  Result Date: 04/26/2021 CLINICAL DATA:  Pulmonary edema. EXAM: PORTABLE CHEST 1 VIEW COMPARISON:  April 25, 2021. FINDINGS: Stable cardiomegaly with central pulmonary vascular congestion. Mild bibasilar pulmonary edema may be present. No pneumothorax or pleural effusion is noted. Bony thorax is unremarkable. IMPRESSION: Stable cardiomegaly with central pulmonary vascular congestion and possible mild bibasilar pulmonary edema. Electronically Signed   By: Marijo Conception M.D.   On: 04/26/2021 10:55     CODE STATUS:     Code Status Orders  (From admission, onward)           Start     Ordered   04/25/21 0930  Full code  Continuous        04/25/21 0936           Code Status History     Date Active Date Inactive Code Status Order ID Comments User Context   05/09/2020 2150 05/12/2020 2301 Full Code [id number]  Sidney Ace Arvella Merles, MD ED   01/19/2020 0541 01/22/2020 2100 Full Code [id number]  Tonye Royalty, NP ED   11/08/2019 2209 11/10/2019 2131 Full Code [id number]  Toy Baker, MD Inpatient        TOTAL TIME TAKING CARE OF THIS PATIENT: 40 minutes.    Fritzi Mandes M.D  [facility]    CC: Primary care physician; Rusty Aus, MD

## 2021-04-27 NOTE — Discharge Instructions (Signed)
Patient advised to use her BiPAP/CPAP daily 2 to 3 times a day. Continue oxygen and inhalers as before

## 2021-04-30 LAB — CULTURE, BLOOD (ROUTINE X 2)
Culture: NO GROWTH
Culture: NO GROWTH
Special Requests: ADEQUATE
Special Requests: ADEQUATE

## 2021-05-03 NOTE — Progress Notes (Signed)
Patient ID: Paula Torres, female    DOB: 11-12-1950, 70 y.o.   MRN: [id number]  HPI  Paula Torres is a 70 y/o female with a history of HTN, anxiety, COPD, previous tobacco use and minimal LVH.   Echo report from 01/21/20 reviewed and showed an EF of 60-65%. Echo report from 11/09/19 reviewed and showed an EF of 60-65% along with minimal LVH.  Catheterization done 12/21/19 showed: Ost Cx to Prox Cx lesion is 20% stenosed. Prox RCA lesion is 40% stenosed.   1.  Moderate one-vessel coronary artery disease involving the right coronary artery.  No evidence of obstructive disease.  Moderately calcified coronary arteries overall. 2.  Left ventricular angiography was not performed.  EF was normal by echo. 3.  Right heart catheterization showed mildly elevated right and left filling pressures, severe pulmonary hypertension and mildly reduced cardiac output.  The patient developed right bundle branch block as the catheter enters the right ventricle.  This, most of the time is transient.   RA: 12 mmHg. RV: 71 over 6 mmHg Pulmonary capillary wedge pressure: 15 mmHg PA: 75/27 with a mean of 47 mmHg. Fick cardiac output is 4.75 with a cardiac index of 2.14. Pulmonary vascular resistance: 6.74 [place] unit  Admitted 04/25/21 due to COPD/ HF exacerbation. Pulmonology consult obtained. Initially needed bipap but able to be weaned off. Initially given IV lasix and transitioned to oral diuretics. Discharged after 2 days.   She presents today for a follow-up visit with a chief complaint of moderate shortness of breath with moderate exertion. She describes this as chronic in nature having been present for many years. She has associated wheezing & pedal edema along with this. She denies fatigue, cough, chest pain, palpitations, abdominal distention, dizziness or difficulty sleeping.   Hasn't been weighing daily but says that she has scales at home.   Still trying to get used to her bipap, has not been able to  wear it all night yet.   Not adding salt to her food but did eat [place] food last night ~ 7pm. Noticed more pedal edema this morning.   Past Medical History:  Diagnosis Date   (HFpEF) heart failure with preserved ejection fraction (HCC)    Chronic respiratory failure with hypoxia (HCC)    COPD (chronic obstructive pulmonary disease) (HCC)    Coronary artery disease, non-occlusive    Hypertension    OSA (obstructive sleep apnea)    PAF (paroxysmal atrial fibrillation) ([place])    Pulmonary hypertension (HCC)    Past Surgical History:  Procedure Laterality Date   NO PAST SURGERIES     RIGHT/LEFT HEART CATH AND CORONARY ANGIOGRAPHY Left 12/21/2019   Procedure: RIGHT/LEFT HEART CATH AND CORONARY ANGIOGRAPHY;  Surgeon: Wellington Hampshire, MD;  Location: [facility];  Service: Cardiovascular;  Laterality: Left;   Family History  Problem Relation Age of Onset   Hypertension Other    Social History   Tobacco Use   Smoking status: Former    Types: Cigarettes    Quit date: 06/03/2017    Years since quitting: 3.9   Smokeless tobacco: Never  Substance Use Topics   Alcohol use: Not Currently   Allergies  Allergen Reactions   Codeine     Back Headache   Sulfa Antibiotics     Reported to pt from mother who is no longer living -  Details unknown   Prior to Admission medications   Medication Sig Start Date End Date Taking? Authorizing Provider  albuterol (  VENTOLIN HFA) 108 (90 Base) MCG/ACT inhaler Inhale 2 puffs into the lungs every 6 (six) hours as needed for wheezing or shortness of breath.   Yes [provider]  atorvastatin (LIPITOR) 20 MG tablet Take 1 tablet (20 mg total) by mouth daily at 6 PM. 11/23/19  Yes Loel Dubonnet, NP  bisoprolol (ZEBETA) 5 MG tablet Take 5 mg by mouth daily. 03/09/20  Yes [provider]  Budeson-Glycopyrrol-Formoterol (BREZTRI AEROSPHERE) 160-9-4.8 MCG/ACT AERO Inhale 2 puffs into the lungs in the morning and at bedtime.   Yes  [provider]  ELIQUIS 5 MG TABS tablet TAKE 1 TABLET(5 MG) BY MOUTH TWICE DAILY 05/30/20  Yes Dunn, Ryan M, PA-C  escitalopram (LEXAPRO) 20 MG tablet Take 20 mg by mouth daily.   Yes [provider]  esomeprazole (NEXIUM) 20 MG capsule Take 20 mg by mouth daily at 12 noon.   Yes [provider]  ferrous gluconate (FERGON) 324 MG tablet Take 324 mg by mouth every morning. 05/04/20  Yes [provider]  furosemide (LASIX) 40 MG tablet Take 2 tablets (80 mg total) by mouth daily. 04/27/21  Yes Fritzi Mandes, MD  ipratropium (ATROVENT) 0.06 % nasal spray Place 2 sprays into both nostrils 3 (three) times daily as needed. 02/12/20  Yes [provider]  Ipratropium-Albuterol (COMBIVENT RESPIMAT) 20-100 MCG/ACT AERS respimat Inhale 1 puff into the lungs every 6 (six) hours as needed (wheezing/shortness of breath.).    Yes [provider]  ipratropium-albuterol (DUONEB) 0.5-2.5 (3) MG/3ML SOLN Take 3 mLs by nebulization every 6 (six) hours as needed (for wheezing/shortness of breath.).    Yes [provider]  loratadine (CLARITIN) 10 MG tablet Take 1 tablet (10 mg total) by mouth daily. 05/13/20  Yes Nicole Kindred A, DO  montelukast (SINGULAIR) 10 MG tablet Take 10 mg by mouth at bedtime.   Yes [provider]  predniSONE (DELTASONE) 20 MG tablet Take 20 mg by mouth daily with breakfast.   Yes [provider]  spironolactone (ALDACTONE) 25 MG tablet Take 1 tablet (25 mg total) by mouth daily. Patient taking differently: Take 50 mg by mouth daily. 11/23/19  Yes Loel Dubonnet, NP  TRELEGY ELLIPTA 100-62.5-25 MCG/INH AEPB Inhale 1 puff into the lungs daily. 11/20/19  Yes [provider]  Treprostinil Diolamine ER 0.25 MG TBCR Take 0.25 mg by mouth in the morning and at bedtime.   Yes [provider]  Vitamin D, Ergocalciferol, (DRISDOL) 1.25 MG (50000 UNIT) CAPS capsule Take 50,000 Units by mouth once a week. 02/01/21   Yes [provider]   Review of Systems  Constitutional:  Negative for appetite change and fatigue.  HENT:  Negative for congestion, postnasal drip and sore throat.   Eyes: Negative.   Respiratory:  Positive for shortness of breath (with moderate exertion) and wheezing ("not much"). Negative for cough.   Cardiovascular:  Positive for leg swelling. Negative for chest pain and palpitations.  Gastrointestinal:  Negative for abdominal distention and abdominal pain.  Endocrine: Negative.   Genitourinary: Negative.   Musculoskeletal:  Negative for back pain and neck pain.  Skin: Negative.   Allergic/Immunologic: Negative.   Neurological:  Negative for dizziness and light-headedness.  Hematological:  Negative for adenopathy. Does not bruise/bleed easily.  Psychiatric/Behavioral:  Negative for dysphoric mood and sleep disturbance (adjusting to bipap). The patient is not nervous/anxious.    Vitals:   05/04/21 1141  BP: 131/71  Pulse: 72  Resp: 18  SpO2: Marland Kitchen)  88%  Weight: 295 lb 2 oz (133.9 kg)  Height: _0  (1.651 m)   Wt Readings from Last 3 Encounters:  05/04/21 295 lb 2 oz (133.9 kg)  04/27/21 286 lb 2.5 oz (129.8 kg)  05/30/20 266 lb (120.7 kg)   Lab Results  Component Value Date   CREATININE 1.25 (H) 04/26/2021   CREATININE 1.11 (H) 04/25/2021   CREATININE 1.30 (H) 06/15/2020    Physical Exam Vitals and nursing note reviewed. Exam conducted with a chaperone present (daughter).  HENT:     Head: Normocephalic and atraumatic.  Cardiovascular:     Rate and Rhythm: Normal rate and regular rhythm.  Pulmonary:     Effort: Pulmonary effort is normal. No respiratory distress.     Breath sounds: No wheezing or rales.     Comments: Pursed lip breathing; oxygen on 4L Abdominal:     General: There is no distension.     Palpations: Abdomen is soft.     Tenderness: There is no abdominal tenderness.  Musculoskeletal:        General: No tenderness.     Cervical back:  Normal range of motion and neck supple.     Right lower leg: Edema (1+ pitting) present.     Left lower leg: Edema (1+ pitting) present.  Skin:    General: Skin is warm and dry.     Findings: Wound (multiple excoriations on lower legs/ arms from scratching) present.  Neurological:     General: No focal deficit present.     Mental Status: She is alert and oriented to person, place, and time.  Psychiatric:        Mood and Affect: Mood is anxious.        Behavior: Behavior normal.        Thought Content: Thought content normal.    Assessment & Plan:  1: Chronic heart failure with preserved ejection fraction without structural changes- - NYHA class II - fluid overloaded today with increased edema and weight gain after eating chinese food last night - not weighing daily but has scales; encouraged to weigh daily and to call for an overnight weight gain of >2 pounds or a weekly weight gain of >5 pounds - weight up 30 pounds from last visit here 1 year ago - not adding salt although she did eat chinese food last night - will give metolazone 2.60m daily for 2 days; advised to take it 1/2 hour before morning furosemide dose - most recent potassium was 5.1 and she is on spironolactone so will not give potassium supplements - sees cardiology (Gilford Rile 11/23/19 - BNP 04/26/21 was 135.6  2: COPD/ severe pulmonary HTN- - Gold stage 3D - saw pulmonology (Lanney Gins 04/20/21; [facility] - wearing oxygen at 3-4L around the clock; checking saturations at home - PFT's done 11/02/19  3: Anxiety- - saw PCP (Sabra Heck 11/02/20; returns 05/10/21 - BMP 04/26/21 reviewed and showed sodium 136, potassium 5.1, creatinine 1.25 and GFR 47 - multiple excoriations on lower legs/ arms/ hands and patient says that her skin doesn't itch and that she "picks" because of her anxiety - counseled on importance of not scratching due to her increase risk of developing cellulitis   Patient did  not bring her medications nor a list. Each medication was verbally reviewed with the patient and she was encouraged to bring the bottles to every visit to confirm accuracy of list.   Return in 6 months or sooner for any  questions/problems before then.

## 2021-05-04 ENCOUNTER — Other Ambulatory Visit: Payer: Self-pay

## 2021-05-04 ENCOUNTER — Encounter: Payer: Self-pay | Admitting: Family

## 2021-05-04 ENCOUNTER — Ambulatory Visit: Payer: Medicare Other | Attending: Family | Admitting: Family

## 2021-05-04 VITALS — BP 131/71 | HR 72 | Resp 18 | Ht 65.0 in | Wt 295.1 lb

## 2021-05-04 DIAGNOSIS — Z7952 Long term (current) use of systemic steroids: Secondary | ICD-10-CM | POA: Insufficient documentation

## 2021-05-04 DIAGNOSIS — Z87891 Personal history of nicotine dependence: Secondary | ICD-10-CM | POA: Insufficient documentation

## 2021-05-04 DIAGNOSIS — Z882 Allergy status to sulfonamides status: Secondary | ICD-10-CM | POA: Diagnosis not present

## 2021-05-04 DIAGNOSIS — F419 Anxiety disorder, unspecified: Secondary | ICD-10-CM | POA: Diagnosis not present

## 2021-05-04 DIAGNOSIS — Z7901 Long term (current) use of anticoagulants: Secondary | ICD-10-CM | POA: Diagnosis not present

## 2021-05-04 DIAGNOSIS — I5032 Chronic diastolic (congestive) heart failure: Secondary | ICD-10-CM

## 2021-05-04 DIAGNOSIS — I272 Pulmonary hypertension, unspecified: Secondary | ICD-10-CM | POA: Diagnosis not present

## 2021-05-04 DIAGNOSIS — F424 Excoriation (skin-picking) disorder: Secondary | ICD-10-CM | POA: Diagnosis not present

## 2021-05-04 DIAGNOSIS — Z885 Allergy status to narcotic agent status: Secondary | ICD-10-CM | POA: Insufficient documentation

## 2021-05-04 DIAGNOSIS — I2510 Atherosclerotic heart disease of native coronary artery without angina pectoris: Secondary | ICD-10-CM | POA: Diagnosis not present

## 2021-05-04 DIAGNOSIS — Z79899 Other long term (current) drug therapy: Secondary | ICD-10-CM | POA: Insufficient documentation

## 2021-05-04 DIAGNOSIS — Z7951 Long term (current) use of inhaled steroids: Secondary | ICD-10-CM | POA: Diagnosis not present

## 2021-05-04 DIAGNOSIS — I110 Hypertensive heart disease with heart failure: Secondary | ICD-10-CM | POA: Insufficient documentation

## 2021-05-04 DIAGNOSIS — J449 Chronic obstructive pulmonary disease, unspecified: Secondary | ICD-10-CM

## 2021-05-04 MED ORDER — METOLAZONE 2.5 MG PO TABS: 2.5000 mg | ORAL_TABLET | Freq: Every day | ORAL | 0 refills | Status: DC

## 2021-05-04 NOTE — Patient Instructions (Addendum)
Begin weighing daily and call for an overnight weight gain of > 2 pounds or a weekly weight gain of >5 pounds.    Begin metolazone (booster fluid pill). Take it 1/2 hour before morning furosemide.

## 2021-05-12 ENCOUNTER — Other Ambulatory Visit: Payer: Self-pay

## 2021-05-12 ENCOUNTER — Emergency Department
Admission: EM | Admit: 2021-05-12 | Discharge: 2021-05-12 | Disposition: A | Discharge status: Home / Self Care | Payer: Medicare Other | Attending: Emergency Medicine | Admitting: Emergency Medicine

## 2021-05-12 DIAGNOSIS — Z79899 Other long term (current) drug therapy: Secondary | ICD-10-CM | POA: Diagnosis not present

## 2021-05-12 DIAGNOSIS — I2510 Atherosclerotic heart disease of native coronary artery without angina pectoris: Secondary | ICD-10-CM | POA: Diagnosis not present

## 2021-05-12 DIAGNOSIS — B351 Tinea unguium: Secondary | ICD-10-CM | POA: Diagnosis not present

## 2021-05-12 DIAGNOSIS — I4891 Unspecified atrial fibrillation: Secondary | ICD-10-CM | POA: Insufficient documentation

## 2021-05-12 DIAGNOSIS — Z87891 Personal history of nicotine dependence: Secondary | ICD-10-CM | POA: Insufficient documentation

## 2021-05-12 DIAGNOSIS — I5033 Acute on chronic diastolic (congestive) heart failure: Secondary | ICD-10-CM | POA: Diagnosis not present

## 2021-05-12 DIAGNOSIS — J449 Chronic obstructive pulmonary disease, unspecified: Secondary | ICD-10-CM | POA: Diagnosis not present

## 2021-05-12 DIAGNOSIS — I110 Hypertensive heart disease with heart failure: Secondary | ICD-10-CM | POA: Insufficient documentation

## 2021-05-12 DIAGNOSIS — S91202A Unspecified open wound of left great toe with damage to nail, initial encounter: Secondary | ICD-10-CM | POA: Diagnosis not present

## 2021-05-12 DIAGNOSIS — Z7901 Long term (current) use of anticoagulants: Secondary | ICD-10-CM | POA: Insufficient documentation

## 2021-05-12 DIAGNOSIS — S99922A Unspecified injury of left foot, initial encounter: Secondary | ICD-10-CM | POA: Diagnosis present

## 2021-05-12 DIAGNOSIS — S91209A Unspecified open wound of unspecified toe(s) with damage to nail, initial encounter: Secondary | ICD-10-CM

## 2021-05-12 DIAGNOSIS — W010XXA Fall on same level from slipping, tripping and stumbling without subsequent striking against object, initial encounter: Secondary | ICD-10-CM | POA: Diagnosis not present

## 2021-05-12 NOTE — Discharge Instructions (Signed)
Keep the wound clean, dry, and covered.  Follow-up with podiatry if necessary.

## 2021-05-12 NOTE — ED Triage Notes (Signed)
Pt come with c/o trip and fall. Pt states pain to left big toe.

## 2021-05-12 NOTE — ED Notes (Signed)
Vaseline gauze with dry guaze on top placed on pt's great toe on the right foot. Pt tolerated well. Pt then wheeled to [facility] to await transport home with family.

## 2021-05-14 NOTE — ED Provider Notes (Signed)
[facility] Emergency Department Provider Note ____________________________________________  Time seen: 1433  I have reviewed the triage vital signs and the nursing notes.  HISTORY  Chief Complaint  Fall   HPI Paula Torres is a 70 y.o. female with the below medical history, presents to the ED for evaluation of traumatic avulsion of the left great toenail.  Patient who is on blood thinners for A. fib, presents after she tripped overnight, causing her hypertrophic onychomycotic nail to be dislodged.  Due to her blood thinner use, there was some extensive bleeding.  She presents today with her adult daughter who is her primary caregiver, for evaluation of the remaining wound.  Patient denies any significant pain or disability to the left great toe patient also denies any other injury at this time.  The daughter presents with the avulsed toenail and a large Ziploc bag for inspection.  Past Medical History:  Diagnosis Date   (HFpEF) heart failure with preserved ejection fraction (HCC)    Chronic respiratory failure with hypoxia (HCC)    COPD (chronic obstructive pulmonary disease) (HCC)    Coronary artery disease, non-occlusive    Hypertension    OSA (obstructive sleep apnea)    PAF (paroxysmal atrial fibrillation) ([place])    Pulmonary hypertension ([place])     Patient Active Problem List   Diagnosis Date Noted   Acute on chronic respiratory failure ([place]) 04/25/2021   Acute on chronic respiratory failure with hypoxia and hypercapnia (HCC) 05/11/2020   AF (paroxysmal atrial fibrillation) ([place]) 05/11/2020   Acute on chronic diastolic congestive heart failure (HCC)    Acute respiratory distress    Pulmonary hypertension (HCC)    Restless leg syndrome    Depression    COPD exacerbation ([place]) 05/09/2020   Atrial fibrillation with RVR ([place]) 01/21/2020   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[place]) 01/21/2020   Abnormal cardiovascular stress test    Demand  ischemia (HCC)    Hyperlipidemia    COPD with acute exacerbation ([place]) 11/08/2019   Acute on chronic diastolic CHF (congestive heart failure) ([place]) 11/08/2019   Acute respiratory failure with hypoxia and hypercapnia (HCC) 11/08/2019   Obesity, Class III, BMI 40-49.9 (morbid obesity) ([place]) 11/08/2019   Elevated troponin 11/08/2019    Past Surgical History:  Procedure Laterality Date   NO PAST SURGERIES     RIGHT/LEFT HEART CATH AND CORONARY ANGIOGRAPHY Left 12/21/2019   Procedure: RIGHT/LEFT HEART CATH AND CORONARY ANGIOGRAPHY;  Surgeon: Wellington Hampshire, MD;  Location: [place] [facility];  Service: Cardiovascular;  Laterality: Left;    Prior to Admission medications   Medication Sig Start Date End Date Taking? Authorizing Provider  albuterol (VENTOLIN HFA) 108 (90 Base) MCG/ACT inhaler Inhale 2 puffs into the lungs every 6 (six) hours as needed for wheezing or shortness of breath.    [provider]  atorvastatin (LIPITOR) 20 MG tablet Take 1 tablet (20 mg total) by mouth daily at 6 PM. 11/23/19   Loel Dubonnet, NP  bisoprolol (ZEBETA) 5 MG tablet Take 5 mg by mouth daily. 03/09/20   [provider]  Budeson-Glycopyrrol-Formoterol (BREZTRI AEROSPHERE) 160-9-4.8 MCG/ACT AERO Inhale 2 puffs into the lungs in the morning and at bedtime.    [provider]  ELIQUIS 5 MG TABS tablet TAKE 1 TABLET(5 MG) BY MOUTH TWICE DAILY 05/30/20   Rise Mu, PA-C  escitalopram (LEXAPRO) 20 MG tablet Take 20 mg by mouth daily.    [provider]  esomeprazole (NEXIUM) 20 MG  capsule Take 20 mg by mouth daily at 12 noon.    [provider]  ferrous gluconate (FERGON) 324 MG tablet Take 324 mg by mouth every morning. 05/04/20   [provider]  furosemide (LASIX) 40 MG tablet Take 2 tablets (80 mg total) by mouth daily. 04/27/21   Fritzi Mandes, MD  ipratropium (ATROVENT) 0.06 % nasal spray Place 2 sprays into both nostrils 3 (three) times daily as needed.  02/12/20   [provider]  Ipratropium-Albuterol (COMBIVENT RESPIMAT) 20-100 MCG/ACT AERS respimat Inhale 1 puff into the lungs every 6 (six) hours as needed (wheezing/shortness of breath.).     [provider]  ipratropium-albuterol (DUONEB) 0.5-2.5 (3) MG/3ML SOLN Take 3 mLs by nebulization every 6 (six) hours as needed (for wheezing/shortness of breath.).     [provider]  loratadine (CLARITIN) 10 MG tablet Take 1 tablet (10 mg total) by mouth daily. 05/13/20   Ezekiel Slocumb, DO  metolazone (ZAROXOLYN) 2.5 MG tablet Take 1 tablet (2.5 mg total) by mouth daily. 05/04/21 08/02/21  Alisa Graff, FNP  montelukast (SINGULAIR) 10 MG tablet Take 10 mg by mouth at bedtime.    [provider]  predniSONE (DELTASONE) 20 MG tablet Take 20 mg by mouth daily with breakfast.    [provider]  spironolactone (ALDACTONE) 25 MG tablet Take 1 tablet (25 mg total) by mouth daily. Patient taking differently: Take 50 mg by mouth daily. 11/23/19   Loel Dubonnet, NP  TRELEGY ELLIPTA 100-62.5-25 MCG/INH AEPB Inhale 1 puff into the lungs daily. 11/20/19   [provider]  Treprostinil Diolamine ER 0.25 MG TBCR Take 0.25 mg by mouth in the morning and at bedtime.    [provider]  Vitamin D, Ergocalciferol, (DRISDOL) 1.25 MG (50000 UNIT) CAPS capsule Take 50,000 Units by mouth once a week. 02/01/21   [provider]    Allergies Codeine and Sulfa antibiotics  Family History  Problem Relation Age of Onset   Hypertension Other     Social History Social History   Tobacco Use   Smoking status: Former    Types: Cigarettes    Quit date: 06/03/2017    Years since quitting: 3.9   Smokeless tobacco: Never  Vaping Use   Vaping Use: Never used  Substance Use Topics   Alcohol use: Not Currently   Drug use: Never    Review of Systems  Constitutional: Negative for fever. Cardiovascular: Negative for chest pain. Respiratory:  Negative for shortness of breath. Gastrointestinal: Negative for abdominal pain, vomiting and diarrhea. Genitourinary: Negative for dysuria. Musculoskeletal: Negative for back pain. Skin: Negative for rash. Left great toenail avulsion as above Neurological: Negative for headaches, focal weakness or numbness. ____________________________________________  PHYSICAL EXAM:  VITAL SIGNS: ED Triage Vitals  Enc Vitals Group     BP 05/12/21 1426 (!) 91/55     Pulse Rate 05/12/21 1426 65     Resp 05/12/21 1426 18     Temp 05/12/21 1426 97.8 F (36.6 C)     Temp src --      SpO2 05/12/21 1426 92 %     Weight --      Height --      Head Circumference --      Peak Flow --      Pain Score 05/12/21 1424 0     Pain Loc --      Pain Edu? --      Excl. in [place]? --  Constitutional: Alert and oriented. Well appearing and in no distress. Head: Normocephalic and atraumatic. Cardiovascular: Normal rate, regular rhythm. Normal distal pulses. Respiratory: Normal respiratory effort. Musculoskeletal: Nontender with normal range of motion in all extremities.  Left great toe with scant blood noted at the proximal cuticle.  Underlying nailbed changes consistent with chronic fungal infection.  No indication of nailbed trauma.  Normal flexion extension range of the great toe. Neurologic:  Normal gross sensation. Normal speech and language. No gross focal neurologic deficits are appreciated. Skin:  Skin is warm, dry and intact. No rash noted. Psychiatric: Mood and affect are normal. Patient exhibits appropriate insight and judgment. ____________________________________________    {LABS (pertinent positives/negatives)  ____________________________________________  {EKG  ____________________________________________   RADIOLOGY Official radiology report(s): No results found. ____________________________________________  PROCEDURES  Wound  care  Procedures ____________________________________________   INITIAL IMPRESSION / ASSESSMENT AND PLAN / ED COURSE  As part of my medical decision making, I reviewed the following data within the electronic MEDICAL RECORD NUMBER History obtained from family and Notes from prior ED visits   DDX: traumatic nail avulsion, nailbed trauma, onychomyolysis  Patient ED evaluation of traumatic nail avulsion of a chronically hypertrophic nail due to underlying onychomycosis.  Patient presents with very minimal trauma to the nailbed and bleeding is well controlled.  No pain or dysfunction of the great toe.  Patient is discharged after the wound is dressed with a Vaseline impregnated gauze for wound care.  She is discharged wound care instructions and supplies patient follow-up with podiatry for ongoing management.  Paula Torres was evaluated in Emergency Department on 05/14/2021 for the symptoms described in the history of present illness. She was evaluated in the context of the global COVID-19 pandemic, which necessitated consideration that the patient might be at risk for infection with the SARS-CoV-2 virus that causes COVID-19. Institutional protocols and algorithms that pertain to the evaluation of patients at risk for COVID-19 are in a state of rapid change based on information released by regulatory bodies including the CDC and federal and state organizations. These policies and algorithms were followed during the patient's care in the ED.  ____________________________________________  FINAL CLINICAL IMPRESSION(S) / ED DIAGNOSES  Final diagnoses:  Traumatic avulsion of nail plate of toe, initial encounter  Fungal nail infection      Melvenia Needles, PA-C 05/14/21 1317    Vladimir Crofts, MD 05/16/21 1611

## 2021-05-25 ENCOUNTER — Other Ambulatory Visit: Payer: Self-pay

## 2021-05-25 ENCOUNTER — Emergency Department: Payer: Medicare Other

## 2021-05-25 ENCOUNTER — Emergency Department
Admission: EM | Admit: 2021-05-25 | Discharge: 2021-05-25 | Disposition: A | Discharge status: Home / Self Care | Payer: Medicare Other | Source: Home / Self Care | Attending: Emergency Medicine | Admitting: Emergency Medicine

## 2021-05-25 ENCOUNTER — Encounter: Payer: Self-pay | Admitting: Emergency Medicine

## 2021-05-25 DIAGNOSIS — A4101 Sepsis due to Methicillin susceptible Staphylococcus aureus: Secondary | ICD-10-CM | POA: Diagnosis not present

## 2021-05-25 DIAGNOSIS — Z79899 Other long term (current) drug therapy: Secondary | ICD-10-CM | POA: Insufficient documentation

## 2021-05-25 DIAGNOSIS — R0602 Shortness of breath: Secondary | ICD-10-CM | POA: Insufficient documentation

## 2021-05-25 DIAGNOSIS — J441 Chronic obstructive pulmonary disease with (acute) exacerbation: Secondary | ICD-10-CM | POA: Insufficient documentation

## 2021-05-25 DIAGNOSIS — I2510 Atherosclerotic heart disease of native coronary artery without angina pectoris: Secondary | ICD-10-CM | POA: Insufficient documentation

## 2021-05-25 DIAGNOSIS — S8002XA Contusion of left knee, initial encounter: Secondary | ICD-10-CM

## 2021-05-25 DIAGNOSIS — W01198A Fall on same level from slipping, tripping and stumbling with subsequent striking against other object, initial encounter: Secondary | ICD-10-CM | POA: Insufficient documentation

## 2021-05-25 DIAGNOSIS — Z7901 Long term (current) use of anticoagulants: Secondary | ICD-10-CM | POA: Insufficient documentation

## 2021-05-25 DIAGNOSIS — I110 Hypertensive heart disease with heart failure: Secondary | ICD-10-CM | POA: Insufficient documentation

## 2021-05-25 DIAGNOSIS — Z87891 Personal history of nicotine dependence: Secondary | ICD-10-CM | POA: Insufficient documentation

## 2021-05-25 DIAGNOSIS — I5033 Acute on chronic diastolic (congestive) heart failure: Secondary | ICD-10-CM | POA: Insufficient documentation

## 2021-05-25 DIAGNOSIS — W19XXXA Unspecified fall, initial encounter: Secondary | ICD-10-CM

## 2021-05-25 DIAGNOSIS — Z7951 Long term (current) use of inhaled steroids: Secondary | ICD-10-CM | POA: Insufficient documentation

## 2021-05-25 MED ORDER — OXYCODONE-ACETAMINOPHEN 5-325 MG PO TABS: 2.0000 | ORAL_TABLET | Freq: Four times a day (QID) | ORAL | 0 refills | Status: DC | PRN

## 2021-05-25 MED ORDER — IBUPROFEN 600 MG PO TABS: 600.0000 mg | ORAL_TABLET | Freq: Once | ORAL | Status: DC

## 2021-05-25 MED ORDER — HYDROCODONE-ACETAMINOPHEN 5-325 MG PO TABS
2.0000 | ORAL_TABLET | Freq: Once | ORAL | Status: AC
  Administered 2021-05-25: 2 via ORAL
  Filled 2021-05-25: qty 2

## 2021-05-25 MED ORDER — OXYCODONE-ACETAMINOPHEN 5-325 MG PO TABS
1.0000 | ORAL_TABLET | Freq: Once | ORAL | Status: AC
Start: 2021-05-25 — End: 2021-05-25
  Administered 2021-05-25: 1 via ORAL
  Filled 2021-05-25: qty 1

## 2021-05-25 MED ORDER — DOCUSATE SODIUM 100 MG PO CAPS: ORAL_CAPSULE | ORAL | 0 refills | Status: AC

## 2021-05-25 NOTE — ED Triage Notes (Signed)
Pt to ED via EMS from home c/o fall and left knee pain tonight.  States got up to go to the bathroom and unsure if she tripped or took a turn too fast.  Denies feeling weak, dizzy, or lightheaded.  Denies hitting head, new bruise to left knee and takes eliqius.  Per EMS VSS, normally on 3L [place].

## 2021-05-25 NOTE — Discharge Instructions (Signed)
As we discussed, although you have a large bruise on your left knee, you did not break any bones or suffer any dislocations.  Please use the provided Ace wrap's to provide firm but not too tight compression on the left knee for support and to help with the bruising/hematoma.  Bear weight as tolerated but please use the walkers that you have at home to assist when you need to get up and walk around.  Keep the leg elevated when possible and use cold compresses to help with the swelling.  Take all of your regular medications.  You can use over-the-counter Tylenol according to label instructions as needed for pain.  Take Percocet as prescribed for severe pain. Do not drink alcohol, drive or participate in any other potentially dangerous activities while taking this medication as it may make you sleepy. Do not take this medication with any other sedating medications, either prescription or over-the-counter. If you were prescribed Percocet or Vicodin, do not take these with acetaminophen (Tylenol) as it is already contained within these medications.   This medication is an opiate (or narcotic) pain medication and can be habit forming.  Use it as little as possible to achieve adequate pain control.  Do not use or use it with extreme caution if you have a history of opiate abuse or dependence.  If you are on a pain contract with your primary care doctor or a pain specialist, be sure to let them know you were prescribed this medication today from the [facility] Emergency Department.  This medication is intended for your use only - do not give any to anyone else and keep it in a secure place where nobody else, especially children, have access to it.  It will also cause or worsen constipation, so you may want to consider taking an over-the-counter stool softener while you are taking this medication.  Please follow-up with your primary care doctor or with orthopedics for a follow-up appointment.  Return to the  emergency department if you develop new or worsening symptoms that concern you.

## 2021-05-25 NOTE — ED Provider Notes (Signed)
[facility] Emergency Department Provider Note  ____________________________________________   Event Date/Time   First MD Initiated Contact with Patient 05/25/21 [phone]     (approximate)  I have reviewed the triage vital signs and the nursing notes.   HISTORY  Chief Complaint Knee Pain and Fall (/)    HPI Paula Torres is a 70 y.o. female with medical history as listed below which notably includes anticoagulation on Eliquis.  She presents tonight after mechanical fall.  She reports that she is supposed to use a walker and in fact has 3 of them at home, but she got up to go to the bathroom, stumbled, and hit her left knee.  She did not lose consciousness and did not feel dizzy.  She does not feel particularly weak and she denies headache, neck pain, chest pain, nausea, vomiting, and abdominal pain.  She has chronic shortness of breath and is on 3 L of oxygen at baseline.  She has some swelling and bruising to her left knee likely due to the Eliquis and pain with moving around the knee.  Onset was acute, pain is worse with movement.    Past Medical History:  Diagnosis Date   (HFpEF) heart failure with preserved ejection fraction (HCC)    Chronic respiratory failure with hypoxia (HCC)    COPD (chronic obstructive pulmonary disease) (HCC)    Coronary artery disease, non-occlusive    Hypertension    OSA (obstructive sleep apnea)    PAF (paroxysmal atrial fibrillation) ([place])    Pulmonary hypertension ([place])     Patient Active Problem List   Diagnosis Date Noted   Acute on chronic respiratory failure ([place]) 04/25/2021   Acute on chronic respiratory failure with hypoxia and hypercapnia (HCC) 05/11/2020   AF (paroxysmal atrial fibrillation) ([place]) 05/11/2020   Acute on chronic diastolic congestive heart failure (HCC)    Acute respiratory distress    Pulmonary hypertension (HCC)    Restless leg syndrome    Depression    COPD exacerbation ([place]) 05/09/2020    Atrial fibrillation with RVR ([place]) 01/21/2020   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[place]) 01/21/2020   Abnormal cardiovascular stress test    Demand ischemia (HCC)    Hyperlipidemia    COPD with acute exacerbation ([place]) 11/08/2019   Acute on chronic diastolic CHF (congestive heart failure) ([place]) 11/08/2019   Acute respiratory failure with hypoxia and hypercapnia (HCC) 11/08/2019   Obesity, Class III, BMI 40-49.9 (morbid obesity) ([place]) 11/08/2019   Elevated troponin 11/08/2019    Past Surgical History:  Procedure Laterality Date   NO PAST SURGERIES     RIGHT/LEFT HEART CATH AND CORONARY ANGIOGRAPHY Left 12/21/2019   Procedure: RIGHT/LEFT HEART CATH AND CORONARY ANGIOGRAPHY;  Surgeon: Wellington Hampshire, MD;  Location: [place] [facility];  Service: Cardiovascular;  Laterality: Left;    Prior to Admission medications   Medication Sig Start Date End Date Taking? Authorizing Provider  docusate sodium (COLACE) 100 MG capsule Take 1 tablet once or twice daily as needed for constipation while taking narcotic pain medicine 05/25/21  Yes Hinda Kehr, MD  oxyCODONE-acetaminophen (PERCOCET) 5-325 MG tablet Take 2 tablets by mouth every 6 (six) hours as needed for severe pain. 05/25/21  Yes Hinda Kehr, MD  albuterol (VENTOLIN HFA) 108 (90 Base) MCG/ACT inhaler Inhale 2 puffs into the lungs every 6 (six) hours as needed for wheezing or shortness of breath.    [provider]  atorvastatin (LIPITOR) 20 MG tablet Take 1 tablet (20  mg total) by mouth daily at 6 PM. 11/23/19   Loel Dubonnet, NP  bisoprolol (ZEBETA) 5 MG tablet Take 5 mg by mouth daily. 03/09/20   [provider]  Budeson-Glycopyrrol-Formoterol (BREZTRI AEROSPHERE) 160-9-4.8 MCG/ACT AERO Inhale 2 puffs into the lungs in the morning and at bedtime.    [provider]  ELIQUIS 5 MG TABS tablet TAKE 1 TABLET(5 MG) BY MOUTH TWICE DAILY 05/30/20   Rise Mu, PA-C  escitalopram (LEXAPRO) 20 MG tablet  Take 20 mg by mouth daily.    [provider]  esomeprazole (NEXIUM) 20 MG capsule Take 20 mg by mouth daily at 12 noon.    [provider]  ferrous gluconate (FERGON) 324 MG tablet Take 324 mg by mouth every morning. 05/04/20   [provider]  furosemide (LASIX) 40 MG tablet Take 2 tablets (80 mg total) by mouth daily. 04/27/21   Fritzi Mandes, MD  ipratropium (ATROVENT) 0.06 % nasal spray Place 2 sprays into both nostrils 3 (three) times daily as needed. 02/12/20   [provider]  Ipratropium-Albuterol (COMBIVENT RESPIMAT) 20-100 MCG/ACT AERS respimat Inhale 1 puff into the lungs every 6 (six) hours as needed (wheezing/shortness of breath.).     [provider]  ipratropium-albuterol (DUONEB) 0.5-2.5 (3) MG/3ML SOLN Take 3 mLs by nebulization every 6 (six) hours as needed (for wheezing/shortness of breath.).     [provider]  loratadine (CLARITIN) 10 MG tablet Take 1 tablet (10 mg total) by mouth daily. 05/13/20   Ezekiel Slocumb, DO  metolazone (ZAROXOLYN) 2.5 MG tablet Take 1 tablet (2.5 mg total) by mouth daily. 05/04/21 08/02/21  Alisa Graff, FNP  montelukast (SINGULAIR) 10 MG tablet Take 10 mg by mouth at bedtime.    [provider]  predniSONE (DELTASONE) 20 MG tablet Take 20 mg by mouth daily with breakfast.    [provider]  spironolactone (ALDACTONE) 25 MG tablet Take 1 tablet (25 mg total) by mouth daily. Patient taking differently: Take 50 mg by mouth daily. 11/23/19   Loel Dubonnet, NP  TRELEGY ELLIPTA 100-62.5-25 MCG/INH AEPB Inhale 1 puff into the lungs daily. 11/20/19   [provider]  Treprostinil Diolamine ER 0.25 MG TBCR Take 0.25 mg by mouth in the morning and at bedtime.    [provider]  Vitamin D, Ergocalciferol, (DRISDOL) 1.25 MG (50000 UNIT) CAPS capsule Take 50,000 Units by mouth once a week. 02/01/21   [provider]    Allergies Codeine and Sulfa  antibiotics  Family History  Problem Relation Age of Onset   Hypertension Other     Social History Social History   Tobacco Use   Smoking status: Former    Types: Cigarettes    Quit date: 06/03/2017    Years since quitting: 3.9   Smokeless tobacco: Never  Vaping Use   Vaping Use: Never used  Substance Use Topics   Alcohol use: Not Currently   Drug use: Never    Review of Systems Constitutional: No fever/chills Eyes: No visual changes. Cardiovascular: Denies chest pain. Respiratory: Chronic shortness of breath. Gastrointestinal: No abdominal pain.  No nausea, no vomiting.   Musculoskeletal: Left knee pain, swelling, and bruising after fall. Integumentary: Chronic lower extremity skin changes. Neurological: Negative for headaches, focal weakness or numbness.   ____________________________________________   PHYSICAL EXAM:  VITAL SIGNS: ED Triage Vitals  Enc Vitals Group     BP 05/25/21 0431 (!) 115/53     Pulse Rate  05/25/21 0431 61     Resp 05/25/21 0431 18     Temp 05/25/21 0431 98.2 F (36.8 C)     Temp Source 05/25/21 0431 Oral     SpO2 05/25/21 0431 98 %     Weight 05/25/21 0425 136.1 kg (300 lb)     Height 05/25/21 0425 1.651 m (_0 )     Head Circumference --      Peak Flow --      Pain Score 05/25/21 0425 10     Pain Loc --      Pain Edu? --      Excl. in [place]? --     Constitutional: Alert and oriented.  Eyes: Conjunctivae are normal.  Head: Atraumatic. Nose: No congestion/rhinnorhea. Mouth/Throat: Patient is wearing a mask. Neck: No stridor.  No meningeal signs.   Cardiovascular: Normal rate, regular rhythm. Good peripheral circulation. Respiratory: Chronic respiratory failure on 3 L of oxygen at baseline with home portable pulse oximeter.  Patient says her breathing feels normal to her.  Gastrointestinal: Morbid obesity.  Nondistended.  No abdominal pain or tenderness. Musculoskeletal: Chronic bilateral lower extremity swelling with skin  thickening and weeping wounds throughout her surfaces which is chronic.  She has an acute ecchymosis and swelling directly over the left knee consistent with her history of contusion.  Tender to manipulation.  However the patient was able to move her own leg and flex the knee without any apparent severe pain. Neurologic:  Normal speech and language. No gross focal neurologic deficits are appreciated.  Skin:  Skin is warm, dry and intact except for the chronic weeping wounds on her lower extremities with no evidence of cellulitis.   ____________________________________________   LABS (all labs ordered are listed, but only abnormal results are displayed)  Labs Reviewed - No data to display ____________________________________________   RADIOLOGY I, Hinda Kehr, personally viewed and evaluated these images (plain radiographs) as part of my medical decision making, as well as reviewing the written report by the radiologist.  ED MD interpretation: Soft tissue edema with no acute bony abnormality.  Official radiology report(s): DG Knee Complete 4 Views Left  Result Date: 05/25/2021 CLINICAL DATA:  Fall.  Pain EXAM: LEFT KNEE - COMPLETE 4+ VIEW COMPARISON:  None. FINDINGS: There is diffuse soft tissue edema involving the anterior and lateral soft tissues. No underlying fracture or dislocation. Tiny joint effusion within the suprapatellar joint space. Patellofemoral and medial compartment narrowing. IMPRESSION: 1. No acute bone abnormality. 2. Soft tissue edema within the anterior and lateral soft tissues which may reflect contusion. 3. Tiny joint effusion. Electronically Signed   By: Kerby Moors M.D.   On: 05/25/2021 06:01    ____________________________________________   PROCEDURES   Procedure(s) performed (including Critical Care):  Procedures   ____________________________________________   INITIAL IMPRESSION / MDM / [place] / ED COURSE  As part of my medical  decision making, I reviewed the following data within the [place] History obtained from family, Nursing notes reviewed and incorporated, Old chart reviewed, Radiograph reviewed , Notes from prior ED visits, and [place] Controlled Substance Database   Differential diagnosis includes, but is not limited to, fracture, dislocation, contusion, hematoma.  I personally reviewed the patient's imaging and agree with the radiologist's interpretation that there is no evidence of fracture or dislocation.  Physical exam is consistent with contusion likely compounded by patient's body habitus and anticoagulation.  I had my usual and customary RICE discussion with the patient and  her daughter and we provided Ace wrap's.  She has walkers at home.  I suggested follow-up with her PCP as well as with orthopedics.  They understand and agree with the plan.  No indication of additional injury or need for additional work-up.  I gave my usual and customary return precautions.           ____________________________________________  FINAL CLINICAL IMPRESSION(S) / ED DIAGNOSES  Final diagnoses:  Fall, initial encounter  Contusion of left knee, initial encounter     MEDICATIONS GIVEN DURING THIS VISIT:  Medications  oxyCODONE-acetaminophen (PERCOCET/ROXICET) 5-325 MG per tablet 1 tablet (1 tablet Oral Given 05/25/21 0445)  HYDROcodone-acetaminophen (NORCO/VICODIN) 5-325 MG per tablet 2 tablet (2 tablets Oral Given 05/25/21 0647)     ED Discharge Orders          Ordered    oxyCODONE-acetaminophen (PERCOCET) 5-325 MG tablet  Every 6 hours PRN        05/25/21 0652    docusate sodium (COLACE) 100 MG capsule        05/25/21 7897             Note:  This document was prepared using Dragon voice recognition software and may include unintentional dictation errors.   Hinda Kehr, MD 05/25/21 [phone]

## 2021-05-28 ENCOUNTER — Inpatient Hospital Stay: Payer: Medicare Other

## 2021-05-28 ENCOUNTER — Other Ambulatory Visit: Payer: Self-pay

## 2021-05-28 ENCOUNTER — Inpatient Hospital Stay
Admission: EM | Admit: 2021-05-28 | Discharge: 2021-06-08 | DRG: 871 | Disposition: A | Discharge status: Skilled Nursing Facility | Payer: Medicare Other | Attending: Internal Medicine | Admitting: Internal Medicine

## 2021-05-28 DIAGNOSIS — A4101 Sepsis due to Methicillin susceptible Staphylococcus aureus: Secondary | ICD-10-CM | POA: Diagnosis present

## 2021-05-28 DIAGNOSIS — I5032 Chronic diastolic (congestive) heart failure: Secondary | ICD-10-CM | POA: Diagnosis present

## 2021-05-28 DIAGNOSIS — G4733 Obstructive sleep apnea (adult) (pediatric): Secondary | ICD-10-CM | POA: Diagnosis not present

## 2021-05-28 DIAGNOSIS — I130 Hypertensive heart and chronic kidney disease with heart failure and stage 1 through stage 4 chronic kidney disease, or unspecified chronic kidney disease: Secondary | ICD-10-CM | POA: Diagnosis present

## 2021-05-28 DIAGNOSIS — I2510 Atherosclerotic heart disease of native coronary artery without angina pectoris: Secondary | ICD-10-CM | POA: Diagnosis present

## 2021-05-28 DIAGNOSIS — J441 Chronic obstructive pulmonary disease with (acute) exacerbation: Secondary | ICD-10-CM | POA: Diagnosis present

## 2021-05-28 DIAGNOSIS — S8002XA Contusion of left knee, initial encounter: Secondary | ICD-10-CM | POA: Diagnosis present

## 2021-05-28 DIAGNOSIS — W01198A Fall on same level from slipping, tripping and stumbling with subsequent striking against other object, initial encounter: Secondary | ICD-10-CM | POA: Diagnosis present

## 2021-05-28 DIAGNOSIS — Z7901 Long term (current) use of anticoagulants: Secondary | ICD-10-CM

## 2021-05-28 DIAGNOSIS — G9341 Metabolic encephalopathy: Secondary | ICD-10-CM | POA: Diagnosis present

## 2021-05-28 DIAGNOSIS — N1831 Chronic kidney disease, stage 3a: Secondary | ICD-10-CM

## 2021-05-28 DIAGNOSIS — D62 Acute posthemorrhagic anemia: Secondary | ICD-10-CM | POA: Diagnosis present

## 2021-05-28 DIAGNOSIS — Z885 Allergy status to narcotic agent status: Secondary | ICD-10-CM

## 2021-05-28 DIAGNOSIS — R0602 Shortness of breath: Secondary | ICD-10-CM | POA: Diagnosis present

## 2021-05-28 DIAGNOSIS — T148XXA Other injury of unspecified body region, initial encounter: Secondary | ICD-10-CM | POA: Diagnosis not present

## 2021-05-28 DIAGNOSIS — Z8249 Family history of ischemic heart disease and other diseases of the circulatory system: Secondary | ICD-10-CM

## 2021-05-28 DIAGNOSIS — E876 Hypokalemia: Secondary | ICD-10-CM | POA: Diagnosis present

## 2021-05-28 DIAGNOSIS — I480 Paroxysmal atrial fibrillation: Secondary | ICD-10-CM | POA: Diagnosis present

## 2021-05-28 DIAGNOSIS — E538 Deficiency of other specified B group vitamins: Secondary | ICD-10-CM | POA: Diagnosis present

## 2021-05-28 DIAGNOSIS — Z8679 Personal history of other diseases of the circulatory system: Secondary | ICD-10-CM | POA: Diagnosis not present

## 2021-05-28 DIAGNOSIS — L089 Local infection of the skin and subcutaneous tissue, unspecified: Secondary | ICD-10-CM | POA: Diagnosis present

## 2021-05-28 DIAGNOSIS — E871 Hypo-osmolality and hyponatremia: Secondary | ICD-10-CM | POA: Diagnosis present

## 2021-05-28 DIAGNOSIS — Z20822 Contact with and (suspected) exposure to covid-19: Secondary | ICD-10-CM | POA: Diagnosis present

## 2021-05-28 DIAGNOSIS — I872 Venous insufficiency (chronic) (peripheral): Secondary | ICD-10-CM | POA: Diagnosis present

## 2021-05-28 DIAGNOSIS — I2729 Other secondary pulmonary hypertension: Secondary | ICD-10-CM | POA: Diagnosis present

## 2021-05-28 DIAGNOSIS — E860 Dehydration: Secondary | ICD-10-CM | POA: Diagnosis present

## 2021-05-28 DIAGNOSIS — A419 Sepsis, unspecified organism: Secondary | ICD-10-CM | POA: Diagnosis present

## 2021-05-28 DIAGNOSIS — Z87891 Personal history of nicotine dependence: Secondary | ICD-10-CM

## 2021-05-28 DIAGNOSIS — E785 Hyperlipidemia, unspecified: Secondary | ICD-10-CM | POA: Diagnosis present

## 2021-05-28 DIAGNOSIS — R6520 Severe sepsis without septic shock: Secondary | ICD-10-CM | POA: Diagnosis present

## 2021-05-28 DIAGNOSIS — R0689 Other abnormalities of breathing: Secondary | ICD-10-CM | POA: Diagnosis not present

## 2021-05-28 DIAGNOSIS — Z7951 Long term (current) use of inhaled steroids: Secondary | ICD-10-CM

## 2021-05-28 DIAGNOSIS — G9340 Encephalopathy, unspecified: Secondary | ICD-10-CM | POA: Diagnosis not present

## 2021-05-28 DIAGNOSIS — L03116 Cellulitis of left lower limb: Secondary | ICD-10-CM | POA: Diagnosis present

## 2021-05-28 DIAGNOSIS — D509 Iron deficiency anemia, unspecified: Secondary | ICD-10-CM | POA: Diagnosis present

## 2021-05-28 DIAGNOSIS — I4819 Other persistent atrial fibrillation: Secondary | ICD-10-CM | POA: Diagnosis not present

## 2021-05-28 DIAGNOSIS — R7881 Bacteremia: Secondary | ICD-10-CM | POA: Diagnosis not present

## 2021-05-28 DIAGNOSIS — Z91199 Patient's noncompliance with other medical treatment and regimen due to unspecified reason: Secondary | ICD-10-CM | POA: Diagnosis not present

## 2021-05-28 DIAGNOSIS — J9621 Acute and chronic respiratory failure with hypoxia: Secondary | ICD-10-CM | POA: Diagnosis present

## 2021-05-28 DIAGNOSIS — Z79899 Other long term (current) drug therapy: Secondary | ICD-10-CM

## 2021-05-28 DIAGNOSIS — K219 Gastro-esophageal reflux disease without esophagitis: Secondary | ICD-10-CM | POA: Diagnosis present

## 2021-05-28 DIAGNOSIS — Z6841 Body Mass Index (BMI) 40.0 and over, adult: Secondary | ICD-10-CM | POA: Diagnosis not present

## 2021-05-28 DIAGNOSIS — J9622 Acute and chronic respiratory failure with hypercapnia: Secondary | ICD-10-CM | POA: Diagnosis present

## 2021-05-28 DIAGNOSIS — E662 Morbid (severe) obesity with alveolar hypoventilation: Secondary | ICD-10-CM | POA: Diagnosis present

## 2021-05-28 DIAGNOSIS — F32A Depression, unspecified: Secondary | ICD-10-CM | POA: Diagnosis present

## 2021-05-28 DIAGNOSIS — Z9981 Dependence on supplemental oxygen: Secondary | ICD-10-CM

## 2021-05-28 DIAGNOSIS — Z882 Allergy status to sulfonamides status: Secondary | ICD-10-CM

## 2021-05-28 DIAGNOSIS — N179 Acute kidney failure, unspecified: Secondary | ICD-10-CM | POA: Diagnosis present

## 2021-05-28 DIAGNOSIS — I272 Pulmonary hypertension, unspecified: Secondary | ICD-10-CM | POA: Diagnosis present

## 2021-05-28 DIAGNOSIS — J449 Chronic obstructive pulmonary disease, unspecified: Secondary | ICD-10-CM | POA: Insufficient documentation

## 2021-05-28 DIAGNOSIS — Z7189 Other specified counseling: Secondary | ICD-10-CM | POA: Diagnosis not present

## 2021-05-28 LAB — COMPREHENSIVE METABOLIC PANEL
ALT: 8 U/L (ref 0–44)
AST: 15 U/L (ref 15–41)
Albumin: 3.2 g/dL — ABNORMAL LOW (ref 3.5–5.0)
Alkaline Phosphatase: 101 U/L (ref 38–126)
Anion gap: 17 — ABNORMAL HIGH (ref 5–15)
BUN: 64 mg/dL — ABNORMAL HIGH (ref 8–23)
CO2: 39 mmol/L — ABNORMAL HIGH (ref 22–32)
Calcium: 9.1 mg/dL (ref 8.9–10.3)
Chloride: 73 mmol/L — ABNORMAL LOW (ref 98–111)
Creatinine, Ser: 2.11 mg/dL — ABNORMAL HIGH (ref 0.44–1.00)
GFR, Estimated: 25 mL/min — ABNORMAL LOW (ref >60)
Glucose, Bld: 135 mg/dL — ABNORMAL HIGH (ref 70–99)
Potassium: 3.1 mmol/L — ABNORMAL LOW (ref 3.5–5.1)
Sodium: 129 mmol/L — ABNORMAL LOW (ref 135–145)
Total Bilirubin: 1.2 mg/dL (ref 0.3–1.2)
Total Protein: 6.6 g/dL (ref 6.5–8.1)

## 2021-05-28 LAB — RESP PANEL BY RT-PCR (FLU A&B, COVID) ARPGX2
Influenza A by PCR: NEGATIVE
Influenza B by PCR: NEGATIVE
SARS Coronavirus 2 by RT PCR: NEGATIVE

## 2021-05-28 LAB — CBC WITH DIFFERENTIAL/PLATELET
Abs Immature Granulocytes: 0.55 10*3/uL — ABNORMAL HIGH (ref 0.00–0.07)
Basophils Absolute: 0.1 10*3/uL (ref 0.0–0.1)
Basophils Relative: 0 %
Eosinophils Absolute: 0.1 10*3/uL (ref 0.0–0.5)
Eosinophils Relative: 0 %
HCT: 24.2 % — ABNORMAL LOW (ref 36.0–46.0)
Hemoglobin: 7.9 g/dL — ABNORMAL LOW (ref 12.0–15.0)
Immature Granulocytes: 2 %
Lymphocytes Relative: 5 %
Lymphs Abs: 1.4 10*3/uL (ref 0.7–4.0)
MCH: 27.1 pg (ref 26.0–34.0)
MCHC: 32.6 g/dL (ref 30.0–36.0)
MCV: 82.9 fL (ref 80.0–100.0)
Monocytes Absolute: 1.6 10*3/uL — ABNORMAL HIGH (ref 0.1–1.0)
Monocytes Relative: 6 %
Neutro Abs: 24.5 10*3/uL — ABNORMAL HIGH (ref 1.7–7.7)
Neutrophils Relative %: 87 %
Platelets: 399 10*3/uL (ref 150–400)
RBC: 2.92 MIL/uL — ABNORMAL LOW (ref 3.87–5.11)
RDW: 15.6 % — ABNORMAL HIGH (ref 11.5–15.5)
Smear Review: NORMAL
WBC: 28.2 10*3/uL — ABNORMAL HIGH (ref 4.0–10.5)
nRBC: 0.1 % (ref 0.0–0.2)

## 2021-05-28 LAB — BLOOD CULTURE ID PANEL (REFLEXED) - BCID2
A.calcoaceticus-baumannii: DETECTED — AB
Bacteroides fragilis: NOT DETECTED
CTX-M ESBL: NOT DETECTED
Candida albicans: NOT DETECTED
Candida auris: NOT DETECTED
Candida glabrata: NOT DETECTED
Candida krusei: NOT DETECTED
Candida parapsilosis: NOT DETECTED
Candida tropicalis: NOT DETECTED
Carbapenem resistance IMP: NOT DETECTED
Carbapenem resistance KPC: NOT DETECTED
Carbapenem resistance NDM: NOT DETECTED
Carbapenem resistance VIM: NOT DETECTED
Cryptococcus neoformans/gattii: NOT DETECTED
Enterobacter cloacae complex: NOT DETECTED
Enterobacterales: NOT DETECTED
Enterococcus Faecium: NOT DETECTED
Enterococcus faecalis: NOT DETECTED
Escherichia coli: NOT DETECTED
Haemophilus influenzae: NOT DETECTED
Klebsiella aerogenes: NOT DETECTED
Klebsiella oxytoca: NOT DETECTED
Klebsiella pneumoniae: NOT DETECTED
Listeria monocytogenes: NOT DETECTED
Neisseria meningitidis: NOT DETECTED
Proteus species: NOT DETECTED
Pseudomonas aeruginosa: NOT DETECTED
Salmonella species: NOT DETECTED
Serratia marcescens: NOT DETECTED
Staphylococcus epidermidis: NOT DETECTED
Staphylococcus lugdunensis: NOT DETECTED
Staphylococcus species: NOT DETECTED
Stenotrophomonas maltophilia: NOT DETECTED
Streptococcus agalactiae: NOT DETECTED
Streptococcus pneumoniae: NOT DETECTED
Streptococcus pyogenes: NOT DETECTED
Streptococcus species: NOT DETECTED

## 2021-05-28 LAB — PROCALCITONIN: Procalcitonin: 0.36 ng/mL

## 2021-05-28 LAB — LACTIC ACID, PLASMA
Lactic Acid, Venous: 2.5 mmol/L (ref 0.5–1.9)
Lactic Acid, Venous: 1.7 mmol/L (ref 0.5–1.9)

## 2021-05-28 LAB — HEMOGLOBIN AND HEMATOCRIT, BLOOD
HCT: 23.3 % — ABNORMAL LOW (ref 36.0–46.0)
Hemoglobin: 7.3 g/dL — ABNORMAL LOW (ref 12.0–15.0)

## 2021-05-28 LAB — MAGNESIUM: Magnesium: 2.3 mg/dL (ref 1.7–2.4)

## 2021-05-28 LAB — BRAIN NATRIURETIC PEPTIDE: B Natriuretic Peptide: 175.7 pg/mL — ABNORMAL HIGH (ref 0.0–100.0)

## 2021-05-28 LAB — C-REACTIVE PROTEIN: CRP: 14.7 mg/dL — ABNORMAL HIGH (ref <1.0)

## 2021-05-28 LAB — SEDIMENTATION RATE: Sed Rate: 108 mm/hr — ABNORMAL HIGH (ref 0–30)

## 2021-05-28 LAB — HIV ANTIBODY (ROUTINE TESTING W REFLEX): HIV Screen 4th Generation wRfx: NONREACTIVE

## 2021-05-28 IMAGING — US US EXTREM LOW VENOUS*L*
1 series · 14 of 24 positions shown · non-contrast
Comparison: None.

CLINICAL DATA: Recent fall, cellulitis, pain and swelling

EXAM:
LEFT LOWER EXTREMITY VENOUS DOPPLER ULTRASOUND
TECHNIQUE: Gray-scale sonography with compression, as well as color and duplex
ultrasound, were performed to evaluate the deep venous system(s)
from the level of the common femoral vein through the popliteal and
proximal calf veins.

[Series 1: us venous img lower uni left (dvt) · portal-venous · 14 of 28 slices shown]
[im 1/28]
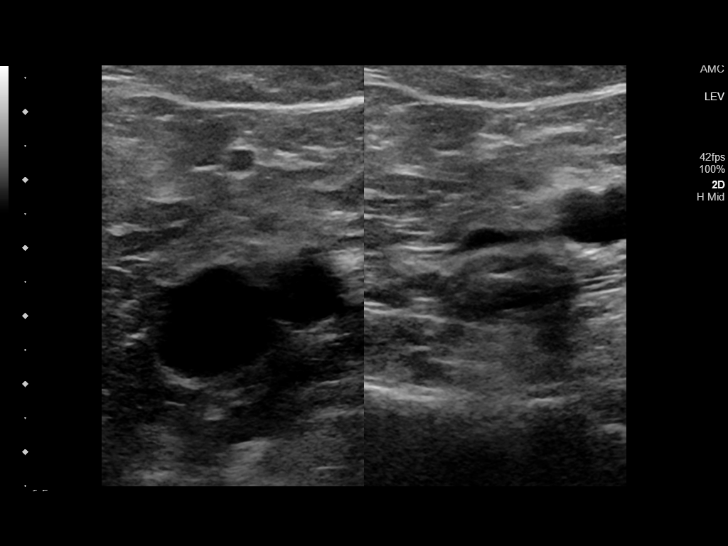
[im 3/28]
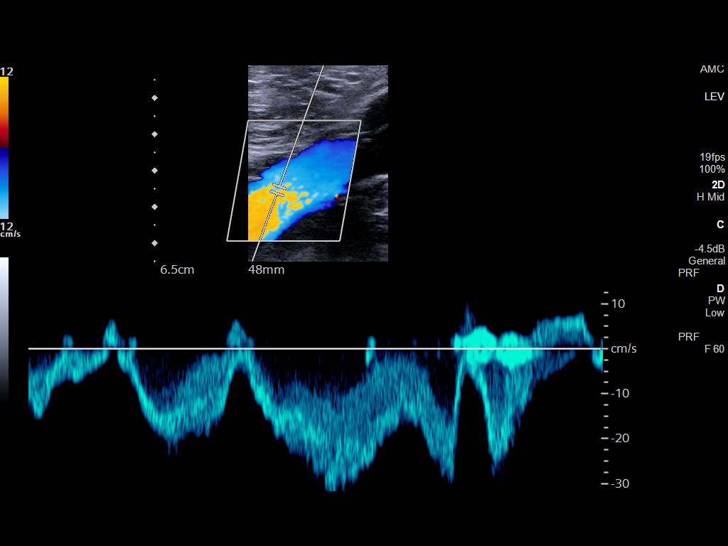
[im 5/28]
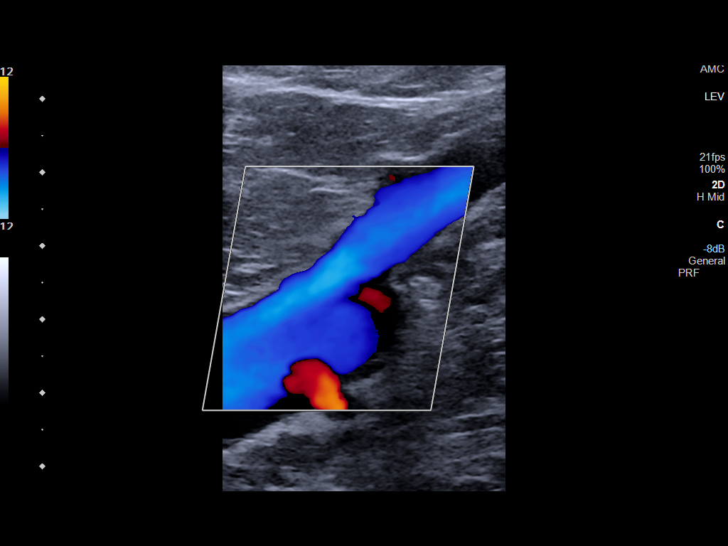
[im 8/28]
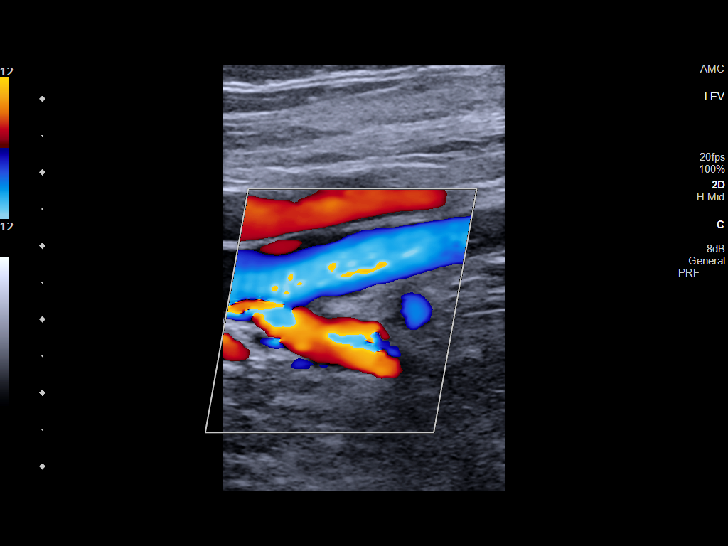
[im 9/28]
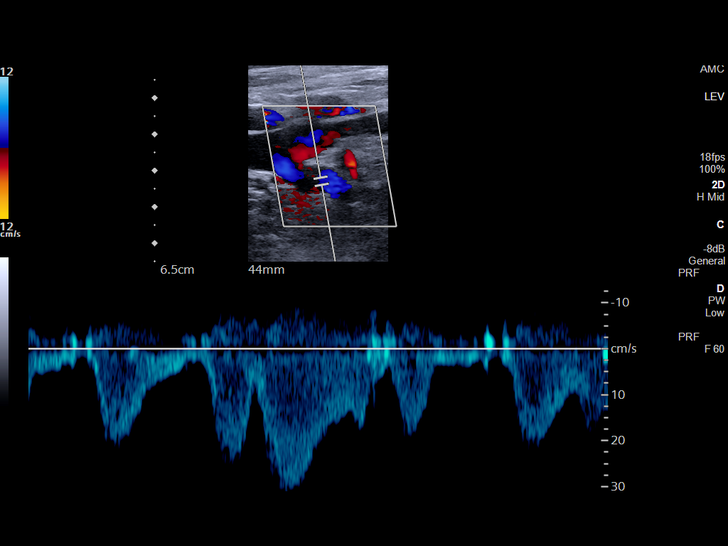
[im 11/28]
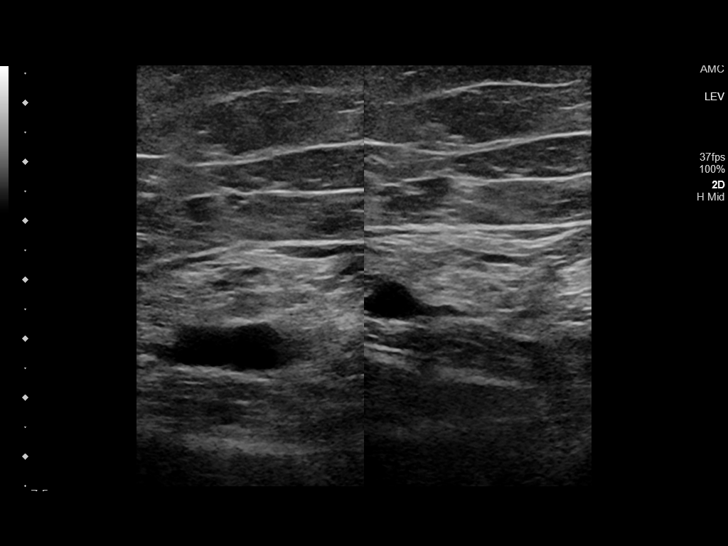
[im 13/28]
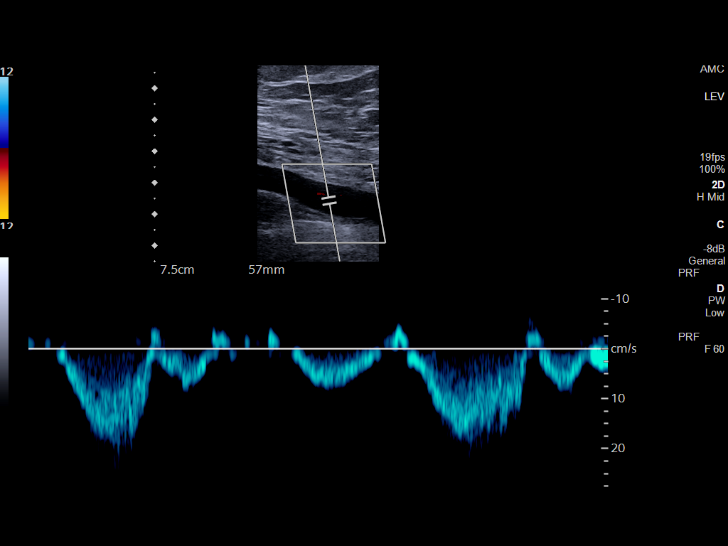
[im 15/28]
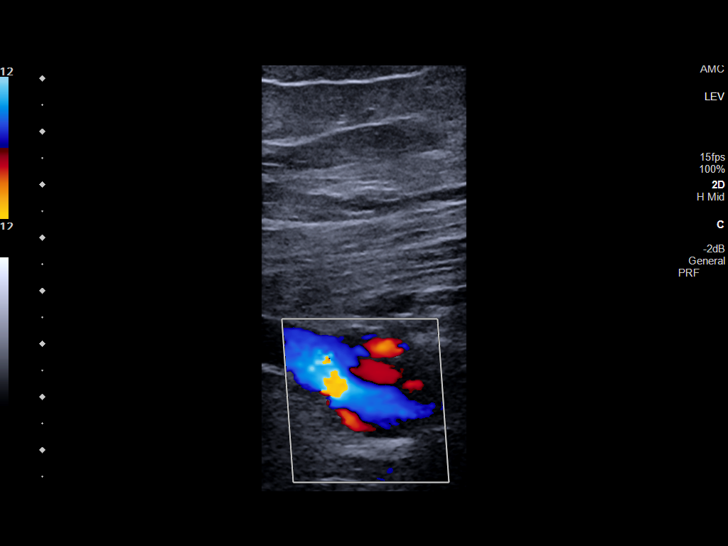
[im 17/28]
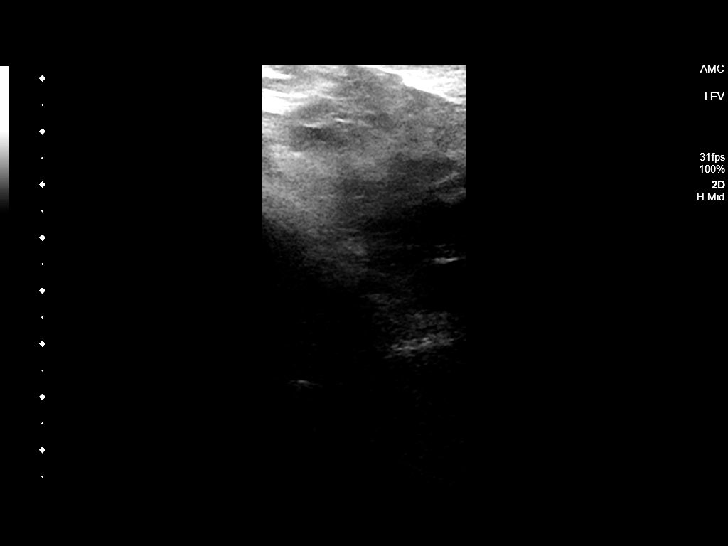
[im 19/28]
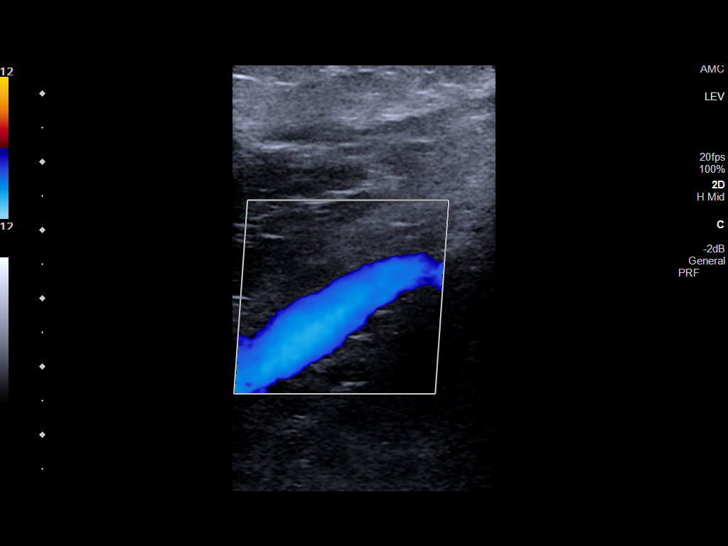
[im 22/28]
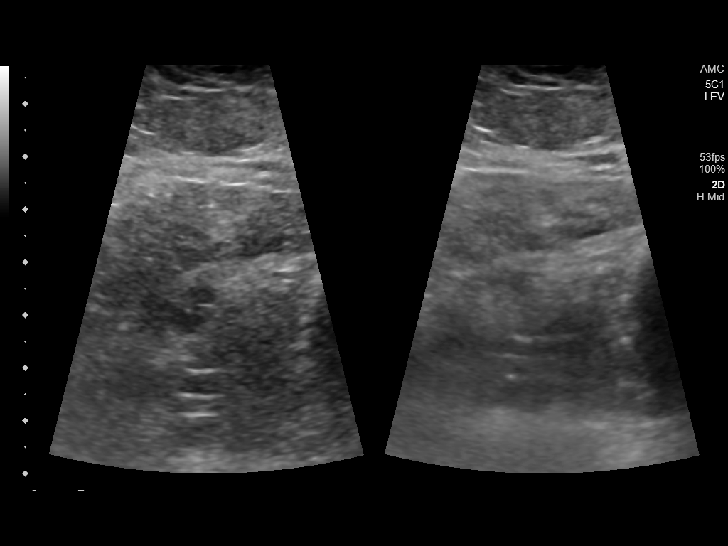
[im 23/28]
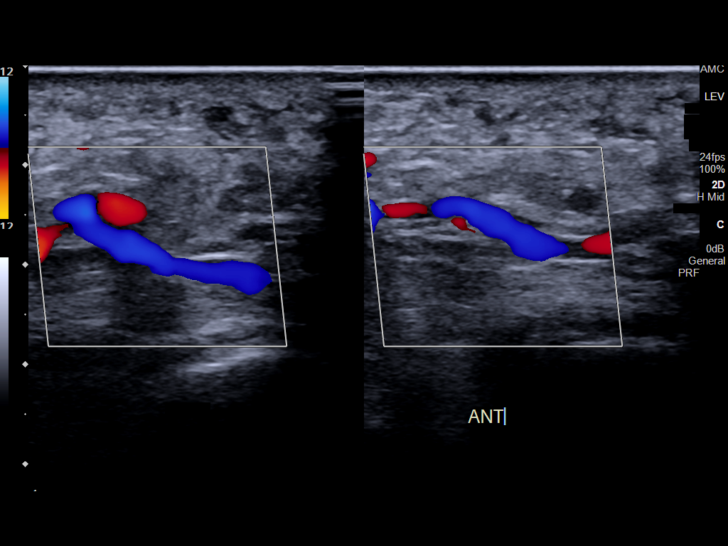
[im 25/28]
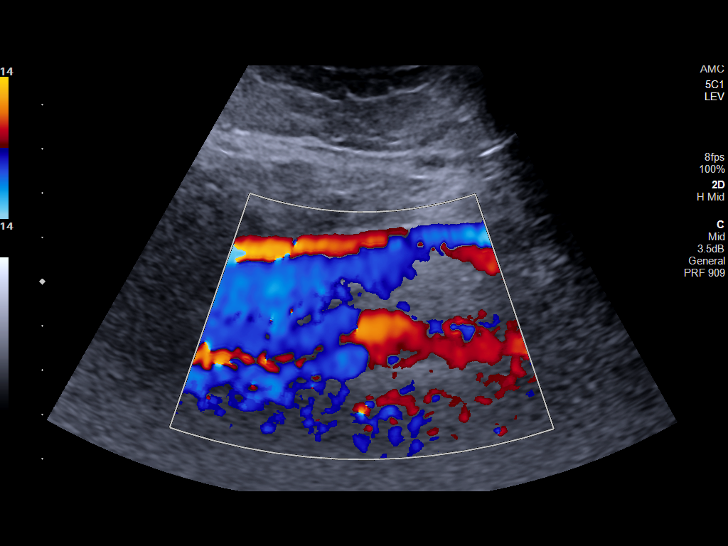
[im 28/28]
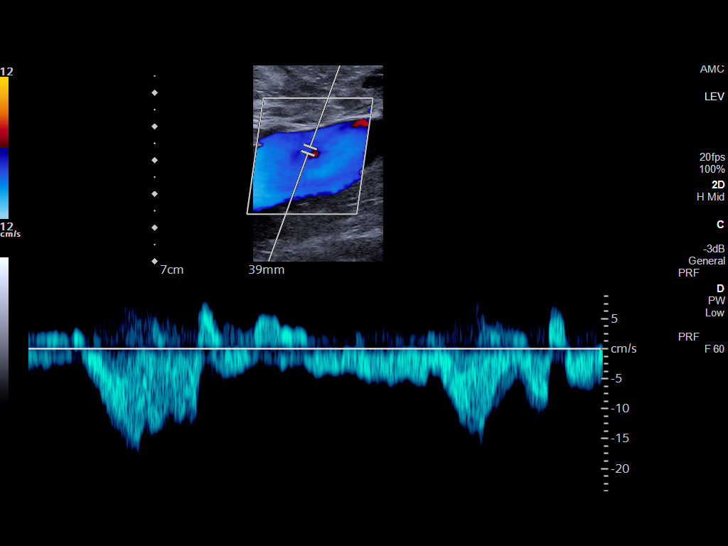

[14 of 24 positions shown; findings below may reference images not displayed]

FINDINGS: VENOUS

Normal compressibility of the common femoral, superficial femoral,
and popliteal veins, as well as the visualized calf veins.
Visualized portions of profunda femoral vein and great saphenous
vein unremarkable. No filling defects to suggest DVT on grayscale or
color Doppler imaging. Doppler waveforms show normal direction of
venous flow, normal respiratory plasticity and response to
augmentation.

Limited views of the contralateral common femoral vein are
unremarkable.

OTHER

None.

Limitations: none
IMPRESSION: Negative examination for deep venous thrombosis in the left lower
extremity.

## 2021-05-28 MED ORDER — SODIUM CHLORIDE 0.9 % IV SOLN
2.0000 g | Freq: Once | INTRAVENOUS | Status: AC
  Administered 2021-05-28: 2 g via INTRAVENOUS
  Filled 2021-05-28: qty 20

## 2021-05-28 MED ORDER — VANCOMYCIN VARIABLE DOSE PER UNSTABLE RENAL FUNCTION (PHARMACIST DOSING): Status: DC

## 2021-05-28 MED ORDER — DM-GUAIFENESIN ER 30-600 MG PO TB12: 1.0000 | ORAL_TABLET | Freq: Two times a day (BID) | ORAL | Status: DC | PRN

## 2021-05-28 MED ORDER — HYDRALAZINE HCL 20 MG/ML IJ SOLN: 5.0000 mg | INTRAMUSCULAR | Status: DC | PRN

## 2021-05-28 MED ORDER — ATORVASTATIN CALCIUM 20 MG PO TABS
20.0000 mg | ORAL_TABLET | Freq: Every day | ORAL | Status: DC
  Administered 2021-05-28 – 2021-06-06 (×9): 20 mg via ORAL
  Filled 2021-05-28 (×10): qty 1

## 2021-05-28 MED ORDER — ONDANSETRON HCL 4 MG/2ML IJ SOLN: 4.0000 mg | Freq: Three times a day (TID) | INTRAMUSCULAR | Status: DC | PRN

## 2021-05-28 MED ORDER — SODIUM CHLORIDE 1 G PO TABS
2.0000 g | ORAL_TABLET | Freq: Two times a day (BID) | ORAL | Status: DC
  Administered 2021-05-28 – 2021-06-02 (×9): 2 g via ORAL
  Filled 2021-05-28 (×14): qty 2

## 2021-05-28 MED ORDER — DOCUSATE SODIUM 100 MG PO CAPS: 100.0000 mg | ORAL_CAPSULE | Freq: Every day | ORAL | Status: DC | PRN

## 2021-05-28 MED ORDER — ACETAMINOPHEN 325 MG PO TABS
650.0000 mg | ORAL_TABLET | Freq: Four times a day (QID) | ORAL | Status: DC | PRN
  Filled 2021-05-28 (×2): qty 2

## 2021-05-28 MED ORDER — VANCOMYCIN HCL 1500 MG/300ML IV SOLN
1500.0000 mg | Freq: Once | INTRAVENOUS | Status: AC
  Administered 2021-05-28: 1500 mg via INTRAVENOUS
  Filled 2021-05-28: qty 300

## 2021-05-28 MED ORDER — ESCITALOPRAM OXALATE 20 MG PO TABS
20.0000 mg | ORAL_TABLET | Freq: Every day | ORAL | Status: DC
  Administered 2021-05-29 – 2021-06-05 (×7): 20 mg via ORAL
  Filled 2021-05-28 (×3): qty 1
  Filled 2021-05-28: qty 2
  Filled 2021-05-28 (×9): qty 1

## 2021-05-28 MED ORDER — SODIUM CHLORIDE 0.9 % IV SOLN: 2.0000 g | INTRAVENOUS | Status: DC

## 2021-05-28 MED ORDER — IPRATROPIUM-ALBUTEROL 0.5-2.5 (3) MG/3ML IN SOLN
3.0000 mL | RESPIRATORY_TRACT | Status: DC
  Administered 2021-05-28 – 2021-05-29 (×3): 3 mL via RESPIRATORY_TRACT
  Filled 2021-05-28 (×4): qty 3

## 2021-05-28 MED ORDER — FAMOTIDINE 20 MG PO TABS
20.0000 mg | ORAL_TABLET | Freq: Every day | ORAL | Status: DC
  Administered 2021-05-28 – 2021-06-06 (×10): 20 mg via ORAL
  Filled 2021-05-28 (×11): qty 1

## 2021-05-28 MED ORDER — OXYCODONE-ACETAMINOPHEN 5-325 MG PO TABS
1.0000 | ORAL_TABLET | ORAL | Status: DC | PRN
  Administered 2021-05-28 – 2021-06-03 (×5): 1 via ORAL
  Filled 2021-05-28 (×5): qty 1

## 2021-05-28 MED ORDER — MONTELUKAST SODIUM 10 MG PO TABS
10.0000 mg | ORAL_TABLET | Freq: Every day | ORAL | Status: DC
  Administered 2021-05-28 – 2021-06-06 (×10): 10 mg via ORAL
  Filled 2021-05-28 (×11): qty 1

## 2021-05-28 MED ORDER — FERROUS GLUCONATE 324 (38 FE) MG PO TABS
324.0000 mg | ORAL_TABLET | ORAL | Status: DC
  Administered 2021-05-29 – 2021-06-05 (×2): 324 mg via ORAL
  Filled 2021-05-28 (×2): qty 1

## 2021-05-28 MED ORDER — VANCOMYCIN HCL IN DEXTROSE 1-5 GM/200ML-% IV SOLN
1000.0000 mg | Freq: Once | INTRAVENOUS | Status: AC
  Administered 2021-05-28: 1000 mg via INTRAVENOUS
  Filled 2021-05-28: qty 200

## 2021-05-28 MED ORDER — SODIUM CHLORIDE 0.9 % IV SOLN: INTRAVENOUS | Status: DC | PRN

## 2021-05-28 MED ORDER — LORATADINE 10 MG PO TABS
10.0000 mg | ORAL_TABLET | Freq: Every day | ORAL | Status: DC
  Administered 2021-05-28 – 2021-06-06 (×10): 10 mg via ORAL
  Filled 2021-05-28 (×10): qty 1

## 2021-05-28 MED ORDER — POTASSIUM CHLORIDE CRYS ER 20 MEQ PO TBCR
40.0000 meq | EXTENDED_RELEASE_TABLET | ORAL | Status: AC
  Filled 2021-05-28: qty 2

## 2021-05-28 MED ORDER — METHYLPREDNISOLONE SODIUM SUCC 125 MG IJ SOLR
60.0000 mg | Freq: Two times a day (BID) | INTRAMUSCULAR | Status: DC
  Administered 2021-05-28 – 2021-06-01 (×9): 60 mg via INTRAVENOUS
  Filled 2021-05-28 (×9): qty 2

## 2021-05-28 MED ORDER — BISOPROLOL FUMARATE 5 MG PO TABS
5.0000 mg | ORAL_TABLET | Freq: Every evening | ORAL | Status: DC
  Administered 2021-05-31 – 2021-06-01 (×2): 5 mg via ORAL
  Filled 2021-05-28 (×4): qty 1

## 2021-05-28 MED ORDER — LACTATED RINGERS IV SOLN: INTRAVENOUS | Status: DC

## 2021-05-28 MED ORDER — ALBUTEROL SULFATE (2.5 MG/3ML) 0.083% IN NEBU: 2.5000 mg | INHALATION_SOLUTION | RESPIRATORY_TRACT | Status: DC | PRN

## 2021-05-28 NOTE — H&P (Addendum)
History and Physical    Paula Torres LVD:[id number] DOB: [birth year] DOA: 05/28/2021  Referring MD/NP/PA:   PCP: Rusty Aus, MD   Patient coming from:  The patient is coming from home.  At baseline, pt is independent for most of ADL.        Chief Complaint: left leg wound with bleeding and pain  HPI: Paula Torres is a 70 y.o. female with medical history significant of dCHF, hyperlipidemia, COPD on 3 L oxygen, GERD, depression, PAF on Eliquis, pulmonary hypertension, OSA on BiPAP, CAD, iron deficiency anemia, CKD-3A, who presents with left leg wound with bleeding and pain.  Patient had mechanical fall and injured her left knee 3 days ago.  She was seen in the ED, and had negative x-ray of her left knee.  After she went home, she developed weeping wounds in left lower leg, with bleeding. Her left lower leg becomes erythematous and painful.  Patient denies fever or chills.  She has cough and shortness of breath due to COPD.  Her shortness of breath is worse on minimal movement.  She is using 3 L oxygen at home, now requiring 4 L oxygen in the ED. Denies chest pain.  No nausea, vomiting, diarrhea or abdominal pain.  No symptoms of UTI.  She states that she took her last dose of Eliquis last night.  ED Course: pt was found to have WBC 28.2, lactic acid 2.5, 1.7, pending COVID-19 PCR, sodium 129, magnesium 3.1, worsening renal function, temperature normal, blood pressure 116/43, heart rate 60, RR 24, negative chest x-ray.  Patient is admitted to progressive bed as inpatient.  Review of Systems:   General: no fevers, chills, no body weight gain, has fatigue HEENT: no blurry vision, hearing changes or sore throat Respiratory: has dyspnea, coughing, wheezing CV: no chest pain, no palpitations GI: no nausea, vomiting, abdominal pain, diarrhea, constipation GU: no dysuria, burning on urination, increased urinary frequency, hematuria  Ext: has leg edema and weeping wound in left leg. Neuro:  no unilateral weakness, numbness, or tingling, no vision change or hearing loss Skin: has wound in left leg MSK: No muscle spasm, no deformity, no limitation of range of movement in spin Heme: No easy bruising.  Travel history: No recent long distant travel.  Allergy:  Allergies  Allergen Reactions   Codeine     Back Headache   Sulfa Antibiotics     Reported to pt from mother who is no longer living -  Details unknown    Past Medical History:  Diagnosis Date   (HFpEF) heart failure with preserved ejection fraction (HCC)    Chronic respiratory failure with hypoxia (HCC)    COPD (chronic obstructive pulmonary disease) (HCC)    Coronary artery disease, non-occlusive    Hypertension    OSA (obstructive sleep apnea)    PAF (paroxysmal atrial fibrillation) ([place])    Pulmonary hypertension (HCC)     Past Surgical History:  Procedure Laterality Date   NO PAST SURGERIES     RIGHT/LEFT HEART CATH AND CORONARY ANGIOGRAPHY Left 12/21/2019   Procedure: RIGHT/LEFT HEART CATH AND CORONARY ANGIOGRAPHY;  Surgeon: Wellington Hampshire, MD;  Location: [facility];  Service: Cardiovascular;  Laterality: Left;    Social History:  reports that she quit smoking about 3 years ago. Her smoking use included cigarettes. She has never used smokeless tobacco. She reports that she does not currently use alcohol. She reports that she does not use drugs.  Family History:  Family History  Problem Relation Age of Onset   Hypertension Other      Prior to Admission medications   Medication Sig Start Date End Date Taking? Authorizing Provider  albuterol (VENTOLIN HFA) 108 (90 Base) MCG/ACT inhaler Inhale 2 puffs into the lungs every 6 (six) hours as needed for wheezing or shortness of breath.    [provider]  atorvastatin (LIPITOR) 20 MG tablet Take 1 tablet (20 mg total) by mouth daily at 6 PM. 11/23/19   Loel Dubonnet, NP  bisoprolol (ZEBETA) 5 MG tablet Take 5 mg by mouth daily. 03/09/20    [provider]  Budeson-Glycopyrrol-Formoterol (BREZTRI AEROSPHERE) 160-9-4.8 MCG/ACT AERO Inhale 2 puffs into the lungs in the morning and at bedtime.    [provider]  docusate sodium (COLACE) 100 MG capsule Take 1 tablet once or twice daily as needed for constipation while taking narcotic pain medicine 05/25/21   Hinda Kehr, MD  ELIQUIS 5 MG TABS tablet TAKE 1 TABLET(5 MG) BY MOUTH TWICE DAILY 05/30/20   Rise Mu, PA-C  escitalopram (LEXAPRO) 20 MG tablet Take 20 mg by mouth daily.    [provider]  esomeprazole (NEXIUM) 20 MG capsule Take 20 mg by mouth daily at 12 noon.    [provider]  ferrous gluconate (FERGON) 324 MG tablet Take 324 mg by mouth every morning. 05/04/20   [provider]  furosemide (LASIX) 40 MG tablet Take 2 tablets (80 mg total) by mouth daily. 04/27/21   Fritzi Mandes, MD  ipratropium (ATROVENT) 0.06 % nasal spray Place 2 sprays into both nostrils 3 (three) times daily as needed. 02/12/20   [provider]  Ipratropium-Albuterol (COMBIVENT RESPIMAT) 20-100 MCG/ACT AERS respimat Inhale 1 puff into the lungs every 6 (six) hours as needed (wheezing/shortness of breath.).     [provider]  ipratropium-albuterol (DUONEB) 0.5-2.5 (3) MG/3ML SOLN Take 3 mLs by nebulization every 6 (six) hours as needed (for wheezing/shortness of breath.).     [provider]  loratadine (CLARITIN) 10 MG tablet Take 1 tablet (10 mg total) by mouth daily. 05/13/20   Ezekiel Slocumb, DO  metolazone (ZAROXOLYN) 2.5 MG tablet Take 1 tablet (2.5 mg total) by mouth daily. 05/04/21 08/02/21  Alisa Graff, FNP  montelukast (SINGULAIR) 10 MG tablet Take 10 mg by mouth at bedtime.    [provider]  oxyCODONE-acetaminophen (PERCOCET) 5-325 MG tablet Take 2 tablets by mouth every 6 (six) hours as needed for severe pain. 05/25/21   Hinda Kehr, MD  predniSONE (DELTASONE) 20 MG tablet Take 20 mg by mouth daily with  breakfast.    [provider]  spironolactone (ALDACTONE) 25 MG tablet Take 1 tablet (25 mg total) by mouth daily. Patient taking differently: Take 50 mg by mouth daily. 11/23/19   Loel Dubonnet, NP  TRELEGY ELLIPTA 100-62.5-25 MCG/INH AEPB Inhale 1 puff into the lungs daily. 11/20/19   [provider]  Treprostinil Diolamine ER 0.25 MG TBCR Take 0.25 mg by mouth in the morning and at bedtime.    [provider]  Vitamin D, Ergocalciferol, (DRISDOL) 1.25 MG (50000 UNIT) CAPS capsule Take 50,000 Units by mouth once a week. 02/01/21   [provider]    Physical Exam: Vitals:   05/28/21 0915 05/28/21 0916  BP: (!) 116/43   Pulse: 60   Resp: (!) 24   Temp: 98.5 F (36.9 C)   TempSrc: Oral   SpO2: 93%   Weight:  136.1 kg  Height:  5' 5" (1.651 m)   General: Not in acute distress HEENT:       Eyes: PERRL, EOMI, no scleral icterus.       ENT: No discharge from the ears and nose, no pharynx injection, no tonsillar enlargement.        Neck: No JVD, no bruit, no mass felt. Heme: No neck lymph node enlargement. Cardiac: S1/S2, RRR, No murmurs, No gallops or rubs. Respiratory: Has wheezing bilaterally GI: Soft, nondistended, nontender, no rebound pain, no organomegaly, BS present. GU: No hematuria Ext: Has chronic venous insufficiency change in right leg.  Has weeping wounds in left leg, with erythema, ecchymosis, warmth and tenderness. Has serosanguineous drainage.       Musculoskeletal: No joint deformities, No joint redness or warmth Skin: No rashes.  Neuro: Alert, oriented X3, cranial nerves II-XII grossly intact, moves all extremities normally.  Psych: Patient is not psychotic, no suicidal or hemocidal ideation.  Labs on Admission: I have personally reviewed following labs and imaging studies  CBC: Recent Labs  Lab 05/28/21 0922  WBC 28.2*  NEUTROABS 24.5*  HGB 7.9*  HCT 24.2*  MCV 82.9  PLT 063   Basic Metabolic Panel: Recent  Labs  Lab 05/28/21 0922  NA 129*  K 3.1*  CL 73*  CO2 39*  GLUCOSE 135*  BUN 64*  CREATININE 2.11*  CALCIUM 9.1  MG 2.3   GFR: Estimated Creatinine Clearance: 35.2 mL/min (A) (by C-G formula based on SCr of 2.11 mg/dL (H)). Liver Function Tests: Recent Labs  Lab 05/28/21 0922  AST 15  ALT 8  ALKPHOS 101  BILITOT 1.2  PROT 6.6  ALBUMIN 3.2*   No results for input(s): LIPASE, AMYLASE in the last 168 hours. No results for input(s): AMMONIA in the last 168 hours. Coagulation Profile: No results for input(s): INR, PROTIME in the last 168 hours. Cardiac Enzymes: No results for input(s): CKTOTAL, CKMB, CKMBINDEX, TROPONINI in the last 168 hours. BNP (last 3 results) No results for input(s): PROBNP in the last 8760 hours. HbA1C: No results for input(s): HGBA1C in the last 72 hours. CBG: No results for input(s): GLUCAP in the last 168 hours. Lipid Profile: No results for input(s): CHOL, HDL, LDLCALC, TRIG, CHOLHDL, LDLDIRECT in the last 72 hours. Thyroid Function Tests: No results for input(s): TSH, T4TOTAL, FREET4, T3FREE, THYROIDAB in the last 72 hours. Anemia Panel: No results for input(s): VITAMINB12, FOLATE, FERRITIN, TIBC, IRON, RETICCTPCT in the last 72 hours. Urine analysis:    Component Value Date/Time   COLORURINE YELLOW (A) 04/25/2021 0523   APPEARANCEUR HAZY (A) 04/25/2021 0523   LABSPEC 1.017 04/25/2021 0523   PHURINE 5.0 04/25/2021 0523   GLUCOSEU NEGATIVE 04/25/2021 0523   HGBUR MODERATE (A) 04/25/2021 0523   BILIRUBINUR NEGATIVE 04/25/2021 0523   KETONESUR NEGATIVE 04/25/2021 0523   PROTEINUR NEGATIVE 04/25/2021 0523   NITRITE POSITIVE (A) 04/25/2021 0523   LEUKOCYTESUR TRACE (A) 04/25/2021 0523   Sepsis Labs: _0 (procalcitonin:4,lacticidven:4) )No results found for this or any previous visit (from the past 240 hour(s)).   Radiological Exams on Admission: DG Chest Port 1 View  Result Date: 05/28/2021 CLINICAL DATA:  Multiple falls,  generalized weakness for about a week. Bilateral lower extremity edema. Is on oxygen at home. Hx of CHF, COPD. EXAM: PORTABLE CHEST 1 VIEW COMPARISON:  04/26/2021 FINDINGS: Mild enlargement of the cardiac silhouette, stable. No mediastinal or hilar masses. Prominent vascular markings similar to the prior study. Linear opacity at the left lung base consistent with  atelectasis or scarring. No lung consolidation. No convincing edema. No visualized pleural effusion and no evidence of a pneumothorax. 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 IMPRESSION: 1. No acute cardiopulmonary disease. Electronically Signed   By: Lajean Manes M.D.   On: 05/28/2021 12:22     EKG: I have personally reviewed.  Sinus rhythm, QTC 458, PVC, early R wave progression  Assessment/Plan Principal Problem:   Wound infection_left lower leg Active Problems:   Hyperlipidemia   COPD exacerbation (HCC)   Pulmonary hypertension (HCC)   Depression   Acute on chronic respiratory failure with hypoxia (HCC)   AF (paroxysmal atrial fibrillation) (HCC)   Severe sepsis (HCC)   Chronic diastolic CHF (congestive heart failure) (HCC)   Acute renal failure superimposed on stage 3a chronic kidney disease (HCC)   Hyponatremia   Hypokalemia   Iron deficiency anemia   Cellulitis of left lower leg   Severe sepsis due to wound infection_left lower leg and cellulitis of left lower leg: Patient meets criteria for severe sepsis with WBC 28.2, tachypnea with RR 24.  Lactic acid is elevated up to 2.5, which is normalized to 1.7.  - will admit to progressive bed as inpatient - Empiric antimicrobial treatment with vancomycin, Rocephin - PRN Zofran for nausea, Percocet for pain - Blood cultures x 2  - ESR and CRP - wound care consult - will get Procalcitonin - IVF: Patient received LR infusion initially, lactic acid is normalized, will stop IV fluid - LE doppler to r/o DVT  Acute on chronic respiratory failure with  hypoxia due to COPD exacerbation:  -Bronchodilators -Solu-Medrol 60 mg IV bid -Patient is on vancomycin and Rocephin for cellulitis as above -Mucinex for cough  -Incentive spirometry  -sputum culture -Nasal cannula oxygen as needed to maintain O2 saturation 93% or greater   Hyperlipidemia -Lipitor  Pulmonary hypertension (HCC) -Treprostinil  Depression -Lexapro  AF (paroxysmal atrial fibrillation) ([place]): Heart rate 60 -Continue home ZeBeta -Hold Eliquis today due to wound bleeding  Chronic diastolic CHF (congestive heart failure) ([place]): 2D echo on 01/21/2020 showed EF 60 to 65%.  Difficult to assess JVD due to morbid obesity, but chest x-ray did not show pulmonary edema.  CHF seem to be compensated -Hold lasix due worsening renal function and hyponatremia -check BNP  Acute renal failure superimposed on stage 3a chronic kidney disease ([place]): Plan creatinine 1.0-1.2.  Her creatinine is at 2.11, BUN 64, likely due to dehydration and continuation of Lasix -Patient was given LR infusion initially -Hold Lasix -Avoid using renal toxic medications  Hyponatremia: Sodium 129, most likely due to decreased oral intake, dehydration and Lasix use.  Mental status normal -Hold Lasix, spironolactone and metolazone -Sodium chloride tablet 2 g twice daily  Hypokalemia: Potassium 3.1 -Repleted potassium -Check magnesium level  Iron deficiency anemia: Hemoglobin 10.3 on 04/26/2021 --> 7.9.  Hemoglobin drop is likely due to left leg wound bleeding -Hold Eliquis -Prescription  OSA: -BiPAP  Addendum: RN reports that pt appears to be confused in afternoon though answers all orientation questions correctly with the exception of the year (daughter says she doesn't usually know the year).  -will get CT-head -check VBA     DVT ppx: SCD to right leg Code Status: Full code Family Communication: Yes, patient's daughter at bed side Disposition Plan:  Anticipate discharge back to previous  environment Consults called:  none Admission status and Level of care: Progressive Cardiac:    as inpt      Status is: Inpatient  Remains inpatient appropriate because:Inpatient level of care appropriate due to severity of illness  Dispo: The patient is from: Home              Anticipated d/c is to: Home              Patient currently is not medically stable to d/c.   Difficult to place patient No          Date of Service 05/28/2021    Ivor Costa [facility]   If 7PM-7AM, please contact night-coverage [email or web address] 05/28/2021, 1:19 PM

## 2021-05-28 NOTE — ED Provider Notes (Signed)
[facility] Emergency Department Provider Note   ____________________________________________   Event Date/Time   First MD Initiated Contact with Patient 05/28/21 1042     (approximate)  I have reviewed the triage vital signs and the nursing notes.   HISTORY  Chief Complaint Weakness and Wound Infection    HPI Paula Torres is a 70 y.o. female who presents for generalized weakness and a lower extremity wound  LOCATION: Generalized and left lower extremity DURATION: Wound has been present since a fall approximately 1 week prior to arrival and lightheadedness has been worsening over the past 2 days TIMING: Worsening SEVERITY: Severe QUALITY: Lower extremity wound with chronic oozing bleeding as well and is worsening for redness and pain CONTEXT: Patient had a fall from standing that resulted in this left lower extremity wound approximately 1 week prior to arrival.  Since then patient has had increased bleeding from these wounds as well as increased weakness that has caused her to have multiple other falls. MODIFYING FACTORS: Any standing and walking worsens her weakness and pain in this left leg and its partially relieved at rest and with oxycodone ASSOCIATED SYMPTOMS: Generalized weakness, continued bleeding from this wound   Per medical record review, patient has history of HFpEF, COPD on 3 L nasal cannula at home, CAD, and pulmonary hypertension          Past Medical History:  Diagnosis Date   (HFpEF) heart failure with preserved ejection fraction (HCC)    Chronic respiratory failure with hypoxia (HCC)    COPD (chronic obstructive pulmonary disease) (HCC)    Coronary artery disease, non-occlusive    Hypertension    OSA (obstructive sleep apnea)    PAF (paroxysmal atrial fibrillation) ([place])    Pulmonary hypertension ([place])     Patient Active Problem List   Diagnosis Date Noted   Severe sepsis ([place]) 05/28/2021   Wound infection_left lower  leg 05/28/2021   COPD (chronic obstructive pulmonary disease) ([place]) 05/28/2021   Chronic diastolic CHF (congestive heart failure) ([place]) 05/28/2021   Acute renal failure superimposed on stage 3a chronic kidney disease ([place]) 05/28/2021   Hyponatremia 05/28/2021   Hypokalemia 05/28/2021   Iron deficiency anemia 05/28/2021   Cellulitis of left lower leg 05/28/2021   Acute on chronic respiratory failure ([place]) 04/25/2021   Acute on chronic respiratory failure with hypoxia (HCC) 05/11/2020   AF (paroxysmal atrial fibrillation) ([place]) 05/11/2020   Acute on chronic diastolic congestive heart failure (HCC)    Acute respiratory distress    Pulmonary hypertension (HCC)    Restless leg syndrome    Depression    COPD exacerbation ([place]) 05/09/2020   Atrial fibrillation with RVR ([place]) 01/21/2020   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[place]) 01/21/2020   Abnormal cardiovascular stress test    Demand ischemia (HCC)    Hyperlipidemia    COPD with acute exacerbation ([place]) 11/08/2019   Acute on chronic diastolic CHF (congestive heart failure) ([place]) 11/08/2019   Acute respiratory failure with hypoxia and hypercapnia (HCC) 11/08/2019   Obesity, Class III, BMI 40-49.9 (morbid obesity) ([place]) 11/08/2019   Elevated troponin 11/08/2019    Past Surgical History:  Procedure Laterality Date   NO PAST SURGERIES     RIGHT/LEFT HEART CATH AND CORONARY ANGIOGRAPHY Left 12/21/2019   Procedure: RIGHT/LEFT HEART CATH AND CORONARY ANGIOGRAPHY;  Surgeon: Wellington Hampshire, MD;  Location: [facility];  Service: Cardiovascular;  Laterality: Left;    Prior to Admission medications   Medication Sig Start Date  End Date Taking? Authorizing Provider  albuterol (VENTOLIN HFA) 108 (90 Base) MCG/ACT inhaler Inhale 2 puffs into the lungs every 6 (six) hours as needed for wheezing or shortness of breath.    [provider]  atorvastatin (LIPITOR) 20 MG tablet Take 1 tablet (20 mg total) by mouth daily  at 6 PM. 11/23/19   Loel Dubonnet, NP  bisoprolol (ZEBETA) 5 MG tablet Take 5 mg by mouth daily. 03/09/20   [provider]  Budeson-Glycopyrrol-Formoterol (BREZTRI AEROSPHERE) 160-9-4.8 MCG/ACT AERO Inhale 2 puffs into the lungs in the morning and at bedtime.    [provider]  docusate sodium (COLACE) 100 MG capsule Take 1 tablet once or twice daily as needed for constipation while taking narcotic pain medicine 05/25/21   Hinda Kehr, MD  ELIQUIS 5 MG TABS tablet TAKE 1 TABLET(5 MG) BY MOUTH TWICE DAILY 05/30/20   Rise Mu, PA-C  escitalopram (LEXAPRO) 20 MG tablet Take 20 mg by mouth daily.    [provider]  esomeprazole (NEXIUM) 20 MG capsule Take 20 mg by mouth daily at 12 noon.    [provider]  ferrous gluconate (FERGON) 324 MG tablet Take 324 mg by mouth every morning. 05/04/20   [provider]  furosemide (LASIX) 40 MG tablet Take 2 tablets (80 mg total) by mouth daily. 04/27/21   Fritzi Mandes, MD  ipratropium (ATROVENT) 0.06 % nasal spray Place 2 sprays into both nostrils 3 (three) times daily as needed. 02/12/20   [provider]  Ipratropium-Albuterol (COMBIVENT RESPIMAT) 20-100 MCG/ACT AERS respimat Inhale 1 puff into the lungs every 6 (six) hours as needed (wheezing/shortness of breath.).     [provider]  ipratropium-albuterol (DUONEB) 0.5-2.5 (3) MG/3ML SOLN Take 3 mLs by nebulization every 6 (six) hours as needed (for wheezing/shortness of breath.).     [provider]  loratadine (CLARITIN) 10 MG tablet Take 1 tablet (10 mg total) by mouth daily. 05/13/20   Ezekiel Slocumb, DO  metolazone (ZAROXOLYN) 2.5 MG tablet Take 1 tablet (2.5 mg total) by mouth daily. 05/04/21 08/02/21  Alisa Graff, FNP  montelukast (SINGULAIR) 10 MG tablet Take 10 mg by mouth at bedtime.    [provider]  oxyCODONE-acetaminophen (PERCOCET) 5-325 MG tablet Take 2 tablets by mouth every 6 (six) hours as needed for  severe pain. 05/25/21   Hinda Kehr, MD  predniSONE (DELTASONE) 20 MG tablet Take 20 mg by mouth daily with breakfast.    [provider]  spironolactone (ALDACTONE) 25 MG tablet Take 1 tablet (25 mg total) by mouth daily. Patient taking differently: Take 50 mg by mouth daily. 11/23/19   Loel Dubonnet, NP  TRELEGY ELLIPTA 100-62.5-25 MCG/INH AEPB Inhale 1 puff into the lungs daily. 11/20/19   [provider]  Treprostinil Diolamine ER 0.25 MG TBCR Take 0.25 mg by mouth in the morning and at bedtime.    [provider]  Vitamin D, Ergocalciferol, (DRISDOL) 1.25 MG (50000 UNIT) CAPS capsule Take 50,000 Units by mouth once a week. 02/01/21   [provider]    Allergies Codeine and Sulfa antibiotics  Family History  Problem Relation Age of Onset   Hypertension Other     Social History Social History   Tobacco Use   Smoking status: Former    Types: Cigarettes    Quit date: 06/03/2017    Years since quitting: 3.9   Smokeless tobacco: Never  Vaping Use   Vaping Use: Never used  Substance Use Topics   Alcohol use: Not Currently   Drug use: Never    Review of Systems Constitutional: No fever/chills.  Eyes: No visual changes. ENT: No sore throat. Cardiovascular: Denies chest pain. Respiratory: Denies shortness of breath. Gastrointestinal: No abdominal pain.  No nausea, no vomiting.  No diarrhea. Genitourinary: Negative for dysuria. Musculoskeletal: Negative for acute arthralgias Skin: Abrasion over the left anterior knee with surrounding ecchymosis and erythema circumferentially down to the foot with multiple areas of oozing serosanguineous drainage Neurological: Endorses generalized weakness.  Negative for headaches, numbness/paresthesias in any extremity Psychiatric: Negative for suicidal ideation/homicidal ideation   ____________________________________________   PHYSICAL EXAM:  VITAL SIGNS: ED Triage Vitals  Enc Vitals Group     BP  05/28/21 0915 (!) 116/43     Pulse Rate 05/28/21 0915 60     Resp 05/28/21 0915 (!) 24     Temp 05/28/21 0915 98.5 F (36.9 C)     Temp Source 05/28/21 0915 Oral     SpO2 05/28/21 0915 93 %     Weight 05/28/21 0916 300 lb (136.1 kg)     Height 05/28/21 0916 _0  (1.651 m)     Head Circumference --      Peak Flow --      Pain Score 05/28/21 0916 8     Pain Loc --      Pain Edu? --      Excl. in [place]? --    Constitutional: Alert and oriented. Well appearing and in no acute distress. Eyes: Conjunctivae are normal. PERRL. Head: Atraumatic. Nose: No congestion/rhinnorhea. Mouth/Throat: Mucous membranes are moist. Neck: No stridor Cardiovascular: Grossly normal heart sounds.  Good peripheral circulation. Respiratory: Normal respiratory effort.  No retractions. Gastrointestinal: Soft and nontender. No distention. Musculoskeletal: No obvious deformities Neurologic:  Normal speech and language. No gross focal neurologic deficits are appreciated. Skin: Multiple areas of excoriation over entire body surface area.  Abrasion over the left anterior knee with surrounding ecchymosis and erythema circumferentially down to the foot with multiple areas of oozing serosanguineous drainage Psychiatric: Mood and affect are normal. Speech and behavior are normal.  ____________________________________________   LABS (all labs ordered are listed, but only abnormal results are displayed)  Labs Reviewed  LACTIC ACID, PLASMA - Abnormal; Notable for the following components:      Result Value   Lactic Acid, Venous 2.5 (*)    All other components within normal limits  COMPREHENSIVE METABOLIC PANEL - Abnormal; Notable for the following components:   Sodium 129 (*)    Potassium 3.1 (*)    Chloride 73 (*)    CO2 39 (*)    Glucose, Bld 135 (*)    BUN 64 (*)    Creatinine, Ser 2.11 (*)    Albumin 3.2 (*)    GFR, Estimated 25 (*)    Anion gap 17 (*)    All other components within normal limits  CBC  WITH DIFFERENTIAL/PLATELET - Abnormal; Notable for the following components:   WBC 28.2 (*)    RBC 2.92 (*)    Hemoglobin 7.9 (*)    HCT 24.2 (*)    RDW 15.6 (*)    Neutro Abs 24.5 (*)    Monocytes Absolute 1.6 (*)    Abs Immature Granulocytes 0.55 (*)    All other components within normal limits  SEDIMENTATION RATE - Abnormal; Notable for the following components:   Sed Rate 108 (*)    All other components within normal limits  BRAIN  NATRIURETIC PEPTIDE - Abnormal; Notable for the following components:   B Natriuretic Peptide 175.7 (*)    All other components within normal limits  RESP PANEL BY RT-PCR (FLU A&B, COVID) ARPGX2  CULTURE, BLOOD (ROUTINE X 2)  CULTURE, BLOOD (ROUTINE X 2)  LACTIC ACID, PLASMA  MAGNESIUM  PROCALCITONIN  URINALYSIS, COMPLETE (UACMP) WITH MICROSCOPIC  C-REACTIVE PROTEIN  HEMOGLOBIN AND HEMATOCRIT, BLOOD  HIV ANTIBODY (ROUTINE TESTING W REFLEX)  TYPE AND SCREEN   ____________________________________________  EKG  ED ECG REPORT I, Naaman Plummer, the attending physician, personally viewed and interpreted this ECG.  Date: 05/28/2021 EKG Time: 0927 Rate: 64 Rhythm: normal sinus rhythm QRS Axis: normal Intervals: normal ST/T Wave abnormalities: normal Narrative Interpretation: 1 PAC appreciated.  No evidence of acute ischemia  ____________________________________________  RADIOLOGY  ED MD interpretation: One-view portable chest x-ray shows no evidence of acute abnormalities including no pneumonia, pneumothorax, or widened mediastinum  Official radiology report(s): DG Chest Port 1 View  Result Date: 05/28/2021 CLINICAL DATA:  Multiple falls, generalized weakness for about a week. Bilateral lower extremity edema. Is on oxygen at home. Hx of CHF, COPD. EXAM: PORTABLE CHEST 1 VIEW COMPARISON:  04/26/2021 FINDINGS: Mild enlargement of the cardiac silhouette, stable. No mediastinal or hilar masses. Prominent vascular markings similar to the prior  study. Linear opacity at the left lung base consistent with atelectasis or scarring. No lung consolidation. No convincing edema. No visualized pleural effusion and no evidence of a pneumothorax. 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 IMPRESSION: 1. No acute cardiopulmonary disease. Electronically Signed   By: Lajean Manes M.D.   On: 05/28/2021 12:22    ____________________________________________   PROCEDURES  Procedure(s) performed (including Critical Care):  .1-3 Lead EKG Interpretation Performed by: Naaman Plummer, MD Authorized by: Naaman Plummer, MD     Interpretation: normal     ECG rate:  61   ECG rate assessment: normal     Rhythm: sinus rhythm     Ectopy: none     Conduction: normal    CRITICAL CARE Performed by: Naaman Plummer   Total critical care time: 37 minutes  Critical care time was exclusive of separately billable procedures and treating other patients.  Critical care was necessary to treat or prevent imminent or life-threatening deterioration.  Critical care was time spent personally by me on the following activities: development of treatment plan with patient and/or surrogate as well as nursing, discussions with consultants, evaluation of patient's response to treatment, examination of patient, obtaining history from patient or surrogate, ordering and performing treatments and interventions, ordering and review of laboratory studies, ordering and review of radiographic studies, pulse oximetry and re-evaluation of patient's condition.  ____________________________________________   INITIAL IMPRESSION / ASSESSMENT AND PLAN / ED COURSE  As part of my medical decision making, I reviewed the following data within the electronic medical record, if available:  Nursing notes reviewed and incorporated, Labs reviewed, EKG interpreted, Old chart reviewed, Radiograph reviewed and Notes from prior ED visits reviewed and incorporated      Presentation and findings most consistent with complex cellulitis, anemia, and worsening renal failure Given History, Exam, and Workup I have low suspicion for Necrotizing Fasciitis, Abscess, Osteomyelitis, DVT or other emergent problem as a cause for this presentation.  Patient started on empiric antibiotics, blood cultures drawn, no fluids administered due to patient's absent signs of septic shock, adequate blood pressure, and history of CHF.  Given these constellation of symptoms, patient will require  admission to the internal medicine service for further evaluation and management.  Dispo: Admit to medicine     ____________________________________________   FINAL CLINICAL IMPRESSION(S) / ED DIAGNOSES  Final diagnoses:  SOB (shortness of breath)  Left leg cellulitis     ED Discharge Orders     None        Note:  This document was prepared using Dragon voice recognition software and may include unintentional dictation errors.    Naaman Plummer, MD 05/28/21 1409

## 2021-05-28 NOTE — Consult Note (Signed)
PHARMACY -  BRIEF ANTIBIOTIC NOTE   Pharmacy has received consult(s) for Vancomcyin from an ED provider.  The patient's profile has been reviewed for ht/wt/allergies/indication/available labs.    One time order(s) placed for Vancomcyin 1566m IV x 1 (to be given immediately following 1g dose ordered by ED provider for a total Loading Dose of 25011m  Further antibiotics/pharmacy consults should be ordered by admitting physician if indicated.                       Thank you,  ChLu DuffelPharmD, BCPS Clinical Pharmacist 05/28/2021 11:03 AM

## 2021-05-28 NOTE — ED Notes (Signed)
Pt assisted to toilet with use of walked.  Pt appears steady on feet for short distances. Sheets changed on bed at this time.

## 2021-05-28 NOTE — ED Notes (Signed)
Secure chat to [place], MD, "Hi - pt appears to be confused - says it is snowing, trying to eat SpO2 sensor, trying to suck on EKG leads. Answers all orientation questions correctly with the exception of the year (although daughter says she doesn't usually know the year). Daughter says that in the past they have collected ABG and her CO2 has been elevated and she has been put on BiPAP temporarily. Please let me know what I can do." MD responded with plan and pt is now on BiPAP.

## 2021-05-28 NOTE — ED Notes (Signed)
Patient transported to CT

## 2021-05-28 NOTE — ED Triage Notes (Signed)
First nurse note: pt comes ems from home with falls, generalized weakness for about a week. Is on oxygen at home. Hx of CHF, COPD and stays around 90% on her home oxygen. Pt on 4L [place] at this time. Pt has weeping leg wounds on left leg with bleeding present. Is on eliquis. Aox4.

## 2021-05-28 NOTE — Consult Note (Signed)
Pharmacy Antibiotic Note  Paula Torres is a 70 y.o. female admitted on 05/28/2021 with purulent lower extremity wound s/p mechanical fall 9/22.  Pharmacy has been consulted for vancomycin dosing. Patient is also on ceftriaxone   Scr 2.11 on admission (baseline ~ 1.2)   Plan: Vancomycin 2500 mg x 1 LD given in ED.  Will withhold scheduled maintenance regimen due to reduced renal function. Expected regimen with current renal function ~ Q48H Monitor renal function for possible dose adjustments   Height: 5' 5" (165.1 cm) Weight: 136.1 kg (300 lb) IBW/kg (Calculated) : 57  Temp (24hrs), Avg:98.5 F (36.9 C), Min:98.5 F (36.9 C), Max:98.5 F (36.9 C)  Recent Labs  Lab 05/28/21 0922 05/28/21 1056  WBC 28.2*  --   CREATININE 2.11*  --   LATICACIDVEN 2.5* 1.7    Estimated Creatinine Clearance: 35.2 mL/min (A) (by C-G formula based on SCr of 2.11 mg/dL (H)).    Allergies  Allergen Reactions   Codeine     Back Headache   Sulfa Antibiotics     Reported to pt from mother who is no longer living -  Details unknown    Antimicrobials this admission: 9/25 ceftriaxone >>  9/25 Vancomycin >>   Dose adjustments this admission: N/a  Microbiology results: 9/25 BCx: sent    Thank you for allowing pharmacy to be a part of this patient's care.  Dorothe Pea, PharmD, BCPS Clinical Pharmacist   05/28/2021 2:18 PM

## 2021-05-28 NOTE — Progress Notes (Signed)
Elink monitoring for sepsis protocol

## 2021-05-28 NOTE — Consult Note (Signed)
CODE SEPSIS - PHARMACY COMMUNICATION  **Broad Spectrum Antibiotics should be administered within 1 hour of Sepsis diagnosis**  Time Code Sepsis Called/Page Received: 1103  Antibiotics Ordered: Ceftriaxone, Vancomcyin  Time of 1st antibiotic administration: 1129  Additional action taken by pharmacy: none  If necessary, Name of Provider/Nurse Contacted: [place] ,PharmD Clinical Pharmacist  05/28/2021  11:02 AM

## 2021-05-29 DIAGNOSIS — T148XXA Other injury of unspecified body region, initial encounter: Secondary | ICD-10-CM | POA: Diagnosis not present

## 2021-05-29 DIAGNOSIS — L089 Local infection of the skin and subcutaneous tissue, unspecified: Secondary | ICD-10-CM | POA: Diagnosis not present

## 2021-05-29 LAB — CBC
HCT: 22.4 % — ABNORMAL LOW (ref 36.0–46.0)
Hemoglobin: 7.2 g/dL — ABNORMAL LOW (ref 12.0–15.0)
MCH: 26.4 pg (ref 26.0–34.0)
MCHC: 32.1 g/dL (ref 30.0–36.0)
MCV: 82.1 fL (ref 80.0–100.0)
Platelets: 386 10*3/uL (ref 150–400)
RBC: 2.73 MIL/uL — ABNORMAL LOW (ref 3.87–5.11)
RDW: 16 % — ABNORMAL HIGH (ref 11.5–15.5)
WBC: 28.3 10*3/uL — ABNORMAL HIGH (ref 4.0–10.5)
nRBC: 0 % (ref 0.0–0.2)

## 2021-05-29 LAB — HEMOGLOBIN AND HEMATOCRIT, BLOOD
HCT: 22.4 % — ABNORMAL LOW (ref 36.0–46.0)
Hemoglobin: 7.2 g/dL — ABNORMAL LOW (ref 12.0–15.0)

## 2021-05-29 LAB — BLOOD GAS, ARTERIAL
Acid-Base Excess: 14.2 mmol/L — ABNORMAL HIGH (ref 0.0–2.0)
Bicarbonate: 39 mmol/L — ABNORMAL HIGH (ref 20.0–28.0)
O2 Saturation: 95.6 %
Patient temperature: 37
pCO2 arterial: 50 mmHg — ABNORMAL HIGH (ref 32.0–48.0)
pH, Arterial: 7.5 — ABNORMAL HIGH (ref 7.350–7.450)
pO2, Arterial: 72 mmHg — ABNORMAL LOW (ref 83.0–108.0)

## 2021-05-29 LAB — BASIC METABOLIC PANEL
Anion gap: 15 (ref 5–15)
BUN: 57 mg/dL — ABNORMAL HIGH (ref 8–23)
CO2: 38 mmol/L — ABNORMAL HIGH (ref 22–32)
Calcium: 9 mg/dL (ref 8.9–10.3)
Chloride: 77 mmol/L — ABNORMAL LOW (ref 98–111)
Creatinine, Ser: 1.6 mg/dL — ABNORMAL HIGH (ref 0.44–1.00)
GFR, Estimated: 35 mL/min — ABNORMAL LOW (ref >60)
Glucose, Bld: 181 mg/dL — ABNORMAL HIGH (ref 70–99)
Potassium: 3.3 mmol/L — ABNORMAL LOW (ref 3.5–5.1)
Sodium: 130 mmol/L — ABNORMAL LOW (ref 135–145)

## 2021-05-29 MED ORDER — POTASSIUM CHLORIDE CRYS ER 20 MEQ PO TBCR
40.0000 meq | EXTENDED_RELEASE_TABLET | ORAL | Status: AC
  Filled 2021-05-29: qty 2

## 2021-05-29 MED ORDER — COLLAGENASE 250 UNIT/GM EX OINT
TOPICAL_OINTMENT | Freq: Every day | CUTANEOUS | Status: DC
  Administered 2021-06-03: 1 via TOPICAL
  Filled 2021-05-29 (×4): qty 30

## 2021-05-29 MED ORDER — AMPICILLIN-SULBACTAM SODIUM 3 (2-1) G IJ SOLR
3.0000 g | Freq: Four times a day (QID) | INTRAMUSCULAR | Status: AC
  Administered 2021-05-29 – 2021-06-07 (×36): 3 g via INTRAVENOUS
  Filled 2021-05-29 (×2): qty 8
  Filled 2021-05-29 (×3): qty 3
  Filled 2021-05-29 (×4): qty 8
  Filled 2021-05-29 (×3): qty 3
  Filled 2021-05-29 (×3): qty 8
  Filled 2021-05-29: qty 3
  Filled 2021-05-29 (×5): qty 8
  Filled 2021-05-29 (×2): qty 3
  Filled 2021-05-29 (×2): qty 8
  Filled 2021-05-29 (×2): qty 3
  Filled 2021-05-29: qty 8
  Filled 2021-05-29: qty 3
  Filled 2021-05-29 (×3): qty 8
  Filled 2021-05-29: qty 3
  Filled 2021-05-29 (×2): qty 8
  Filled 2021-05-29 (×3): qty 3
  Filled 2021-05-29 (×3): qty 8

## 2021-05-29 MED ORDER — SODIUM CHLORIDE 0.9 % IV SOLN: 3.0000 g | Freq: Two times a day (BID) | INTRAVENOUS | Status: DC

## 2021-05-29 MED ORDER — SODIUM CHLORIDE 0.9 % IV SOLN
3.0000 g | Freq: Three times a day (TID) | INTRAVENOUS | Status: DC
  Administered 2021-05-29: 3 g via INTRAVENOUS
  Filled 2021-05-29: qty 8

## 2021-05-29 MED ORDER — LORAZEPAM 0.5 MG PO TABS
0.5000 mg | ORAL_TABLET | ORAL | Status: DC | PRN
  Administered 2021-06-03: 0.5 mg via ORAL
  Filled 2021-05-29: qty 1

## 2021-05-29 NOTE — Consult Note (Addendum)
Pharmacy Antibiotic Note  Paula Torres is a 71 y.o. female admitted on 05/28/2021 with purulent lower extremity wound s/p mechanical fall 9/22.  Pharmacy has been consulted for vancomycin dosing. Patient is also on ceftriaxone   9/26:  D/C Ceftriaxone and Vancomycin.  Transition pt to Unasyn based on BCID results:   Scr 2.11 on admission (baseline ~ 1.2)   Plan: Unasyn 3 gm q8h per indication and renal fxn.  Pharmacy will continue to monitor and will adjust dose if warranted.   Height: _0  (165.1 cm) Weight: 136.1 kg (300 lb) IBW/kg (Calculated) : 57  Temp (24hrs), Avg:98.5 F (36.9 C), Min:98.5 F (36.9 C), Max:98.5 F (36.9 C)  Recent Labs  Lab 05/28/21 0922 05/28/21 1056  WBC 28.2*  --   CREATININE 2.11*  --   LATICACIDVEN 2.5* 1.7     Estimated Creatinine Clearance: 35.2 mL/min (A) (by C-G formula based on SCr of 2.11 mg/dL (H)).    Allergies  Allergen Reactions   Codeine     Back Headache   Sulfa Antibiotics     Reported to pt from mother who is no longer living -  Details unknown    Antimicrobials this admission: 9/25 ceftriaxone >> 9/25 9/25 Vancomycin >> 9/25 9/26 Unasyn >>   Dose adjustments this admission: N/a  Microbiology results: 9/25 BCx: 2 of 2 bottles with Acinetobacter calcoaceticus-baumannii  Thank you for allowing pharmacy to be a part of this patient's care.  Renda Rolls, PharmD, [facility] 05/29/2021 3:03 AM

## 2021-05-29 NOTE — Evaluation (Signed)
Occupational Therapy Evaluation Patient Details Name: Paula Torres MRN: [id number] DOB: [birth year] Today's Date: 05/29/2021   History of Present Illness 70 y.o. female with medical history significant of dCHF, HLD, COPD on 3 L oxygen, GERD, depression, PAF on Eliquis, pulmonary HTN, OSA on BiPAP, CAD, iron deficiency anemia, CKD-3A, who presents with left leg wound with bleeding and pain.   Clinical Impression   Pt was seen for OT evaluation this date. Prior to recent hospital admission ~84moago, pt was mod indep with rollator and basic ADL, requiring PRN assist from dtr and assist for meds, meals, housekeeping, and driving. Pt reports 3 falls in past month since admission and increased SOB with need for increased assist from her daughter for LB ADL 2/2 SOB with exertion. [facility], and dtr's boyfriend. Currently pt demonstrates impairments as described below (See OT problem list) which functionally limit her ability to perform ADL/self-care tasks.   Pt requires Min-MOD A for LB ADL, remote supervision for lateral lean pericare after toileting, MOD A for toilet transfer (std height toilet), and was able to negotiate 2WW from the toilet to the ED stretcher ~127fwith significant SOB/effort. Pt instructed in PLB to support breath recovery. SpO2 down to 80-81% on 4L O2, requiring 6L O2 to come back up to 90-92%. Left on 4L O2 at end of session with SpO2 90-91%. RN notified. Pt would benefit from skilled OT services to address noted impairments and functional limitations (see below for any additional details) in order to maximize safety and independence while minimizing falls risk and caregiver burden. Upon hospital discharge, recommend STR to maximize pt safety and return to PLOF.    Recommendations for follow up therapy are one component of a multi-disciplinary discharge planning process, led by the attending physician.  Recommendations may be updated based on  patient status, additional functional criteria and insurance authorization.   Follow Up Recommendations  SNF    Equipment Recommendations  3 in 1 bedside commode    Recommendations for Other Services       Precautions / Restrictions Precautions Precautions: Fall Precaution Comments: monitor O2 sats Restrictions Weight Bearing Restrictions: No Other Position/Activity Restrictions: BLE L>R wounds      Mobility Bed Mobility Overal bed mobility: Needs Assistance Bed Mobility: Sit to Supine       Sit to supine: Min guard   General bed mobility comments: increased time to recover after getting to EOB from toilet in room, increased time/effort to get BLE back into bed    Transfers Overall transfer level: Needs assistance Equipment used: Rolling walker (2 wheeled) Transfers: Sit to/from Stand Sit to Stand: Mod assist         General transfer comment: from std height commode in ED room, pt required MOD A after initial attempt with CGA was unsuccessful.    Balance Overall balance assessment: Needs assistance Sitting-balance support: Feet supported;No upper extremity supported Sitting balance-Leahy Scale: Fair     Standing balance support: Bilateral upper extremity supported Standing balance-Leahy Scale: Poor Standing balance comment: reliant on RW                           ADL either performed or assessed with clinical judgement   ADL Overall ADL's : Needs assistance/impaired  General ADL Comments: Pt currently requires MIN-MOD A for LB ADL tasks, performed pericare after toileting using lateral lean with remote supervision, MOD A to stand from std height toilet with 2WW on 2nd attempt (1st attempt unsuccessful). Becomes SOB quickly.     Vision Baseline Vision/History: 1 Wears glasses Ability to See in Adequate Light: 0 Adequate       Perception     Praxis      Pertinent Vitals/Pain Pain  Assessment: Faces Faces Pain Scale: Hurts a little bit Pain Location: low back (chronic) Pain Descriptors / Indicators: Aching;Grimacing Pain Intervention(s): Limited activity within patient's tolerance;Monitored during session;Repositioned     Hand Dominance Right   Extremity/Trunk Assessment Upper Extremity Assessment Upper Extremity Assessment: Generalized weakness   Lower Extremity Assessment Lower Extremity Assessment: Generalized weakness (BLE noted with multiple small wounds/scrapes, L knee black)       Communication Communication Communication: No difficulties   Cognition Arousal/Alertness: Awake/alert Behavior During Therapy: WFL for tasks assessed/performed Overall Cognitive Status: No family/caregiver present to determine baseline cognitive functioning                                 General Comments: Pt alert and oriented to self, situation, place, but states it's August 2023 when asked. Endorses difficulty with memory for things like using the lift feature on recliner. Follows commands well during session.   General Comments  On 4L O2 when OT found her on the toilet. Once back to EOB, SpO2 noted to be 80-81% on 4L. Bumped up to 5L + PLB improved to 83% after 51mn. Bumped to 6L O2 and SpO2 improved to 90-91% within 2 min. Once returned to bed, O2 bumped back down to 4L O2 and pt SpO2 maintained at 90-91%.    Exercises Other Exercises Other Exercises: Pt instructed in pursed lip breathing to support breath recovery after exertional activity. Increased recovery time required for SpO2 to improve with increased supplemental O2 needed. Other Exercises: Pt instructed in AE/DME for improved safety/indep with seated showers.   Shoulder Instructions      Home Living Family/patient expects to be discharged to:: Private residence Living Arrangements: Spouse/significant other;Children Available Help at Discharge: Family;Available 24 hours/day Type of Home:  House Home Access: Stairs to enter ECenterPoint Energyof Steps: 2   Home Layout: One level     Bathroom Shower/Tub: WOccupational psychologist Handicapped height     Home Equipment: WEnvironmental consultant- 4 wheels;Bedside commode;Shower seat   Additional Comments: [facility]      Prior Functioning/Environment Level of Independence: Independent with assistive device(s)        Comments: Prior to recent admission ~140mogo, pt was mod indep with ADL and mobility using 4WW and PRN light assist from dtr for LB dressing and IADL such as medications, driving. Dtr's boyfriend assists with meals. Over the past week, pt has required increased assist for transfers from her lift recliner (states she forgets that it lifts), dressing, and bathing, becoming very winded after minimal exertion.        OT Problem List: Decreased strength;Pain;Decreased cognition;Decreased activity tolerance;Decreased knowledge of use of DME or AE;Obesity;Impaired balance (sitting and/or standing)      OT Treatment/Interventions: Self-care/ADL training;Therapeutic exercise;Therapeutic activities;Energy conservation;DME and/or AE instruction;Patient/family education;Balance training    OT Goals(Current goals can be found in the care plan section) Acute Rehab OT Goals Patient  Stated Goal: go home and breathe better OT Goal Formulation: With patient Time For Goal Achievement: 06/10/2021 Potential to Achieve Goals: Good ADL Goals Pt Will Perform Lower Body Dressing: with modified independence;sit to/from stand (AE PRN) Pt Will Transfer to Toilet: with modified independence;ambulating (elevated commode, LRAD PRN) Additional ADL Goal #1: Pt will verbalize plan to implement at least 2 learned falls prevention strategies to maximize safety. Additional ADL Goal #2: Pt will utilize learned ECS into daily ADL routine with PRN VC to initiate.  OT Frequency: Min 1X/week   Barriers to  D/C:            Co-evaluation              AM-PAC OT "6 Clicks" Daily Activity     Outcome Measure Help from another person eating meals?: None Help from another person taking care of personal grooming?: None Help from another person toileting, which includes using toliet, bedpan, or urinal?: A Lot Help from another person bathing (including washing, rinsing, drying)?: A Lot Help from another person to put on and taking off regular upper body clothing?: A Little Help from another person to put on and taking off regular lower body clothing?: A Lot 6 Click Score: 17   End of Session Equipment Utilized During Treatment: Rolling walker;Oxygen Nurse Communication: Other (comment) (SpO2)  Activity Tolerance: Patient tolerated treatment well Patient left: in bed;with call bell/phone within reach  OT Visit Diagnosis: Other abnormalities of gait and mobility (R26.89);Repeated falls (R29.6);Muscle weakness (generalized) (M62.81)                Time: 6067-7034 OT Time Calculation (min): 28 min Charges:  OT General Charges $OT Visit: 1 Visit OT Evaluation $OT Eval Moderate Complexity: 1 Mod OT Treatments $Self Care/Home Management : 8-22 mins  Ardeth Perfect., MPH, MS, OTR/L ascom [phone] 05/29/21, 10:37 AM

## 2021-05-29 NOTE — Consult Note (Signed)
[facility] Nurse Consult Note: Reason for Consult:Patient with fall approximately 2 weeks ago, ecchymosis and a contusion on the left knee. Bilateral LE edema with partial thickness areas of skin loss. Erythema and weeping. Wound type:trauma, venous insufficiency Pressure Injury POA: N/A Measurement: Left knee injury with necrotic tissue to be measured by Bedside RN today and documented on the Nursing Flow Sheet. Wound bed: Description is supplemented by photos taken by Dr. Blaine Hamper.  Bilateral LEs with erythema and edema. Linear area of traumatic injury is partial thickness on LLE. Drainage (amount, consistency, odor) serous to serosanguinous Periwound:As noted above, with edema and erythema and ecchymosis Dressing procedure/placement/frequency: Pressure injury prevention will be with Prevalon pressure redistribution heel boots and a sacral foam bordered dressing. Topical care to the LEs will be with cleansing followed by placement of xeroform gauze over open areas topped with ABD pads for comfort. The dressing will be secured by wrapping from just below toes to just below knees with Kerlix roll gauze. This will be topped with ACE bandages for light (13-33mHg) compression. This will be changed twice daily. The left knee full thickness injury with scab vs eschar will be treated with collagenase/Santyl and topped with a saline moistened gauze covered by dry gauze and secured. This will be changed once daily.  I recommend continues follow up from either Vascular or the outpatient wound care center of her choosing post discharge. If you agree, please Order/arrange.  WOmahanursing team will not follow, but will remain available to this patient, the nursing and medical teams.  Please re-consult if needed. Thanks, LMaudie Flakes MSN, RN, [place] CArther Abbott Pager# [phone]

## 2021-05-29 NOTE — ED Notes (Signed)
Breakfast tray delivered to pt at this time.

## 2021-05-29 NOTE — ED Notes (Signed)
Area of eschar 8cm (W) x 5cm (H).

## 2021-05-29 NOTE — Progress Notes (Signed)
PT Cancellation Note  Patient Details Name: Paula Torres MRN: [id number] DOB: [birth year]   Cancelled Treatment:    Reason Eval/Treat Not Completed: Other (comment). PT orders received, pt chart reviewed. Upon entry, OT was just finishing evaluation. Pt requesting more time to eat breakfast. PT will return at a later time this date.   Patrina Levering PT, DPT 05/29/21 9:32 AM [phone]

## 2021-05-29 NOTE — Consult Note (Signed)
Pharmacy Antibiotic Note  Paula Torres is a 70 y.o. female admitted on 05/28/2021 with purulent lower extremity wound s/p mechanical fall 9/22.  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   Pharmacy will continue to monitor and will adjust dose if warranted.     Height: _0  (165.1 cm) Weight: 136.1 kg (300 lb) IBW/kg (Calculated) : 57  Temp (24hrs), Avg:98.5 F (36.9 C), Min:98.5 F (36.9 C), Max:98.5 F (36.9 C)  Recent Labs  Lab 05/28/21 0922 05/28/21 1056 05/29/21 0658  WBC 28.2*  --  28.3*  CREATININE 2.11*  --  1.60*  LATICACIDVEN 2.5* 1.7  --      Estimated Creatinine Clearance: 46.4 mL/min (A) (by C-G formula based on SCr of 1.6 mg/dL (H)).    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  Chinita Greenland PharmD Clinical Pharmacist 05/29/2021

## 2021-05-29 NOTE — ED Notes (Signed)
Pt to bathroom in wheelchair and oxygen with assistance.

## 2021-05-29 NOTE — Progress Notes (Addendum)
PHARMACY - PHYSICIAN COMMUNICATION CRITICAL VALUE ALERT - BLOOD CULTURE IDENTIFICATION (BCID)  BCID:  2 of 2 bottles with Acinetobacter calcoaceticus - baumannii.  Pt currently on Ceftriaxone 2gm daily and Vancomycin.  First Line recommendation is Unasyn, since pt has no PCN allergy documented.  Pt w/ recent + ESBL Ucx from 04/25/21.  Name of physician (or Provider) ContactedSidney Ace, MD  Changes to prescribed antibiotics required:Transition pt to Unasyn.  Renda Rolls, PharmD, [facility] 05/29/2021 2:46 AM

## 2021-05-29 NOTE — Progress Notes (Signed)
ABG obtained. Patient was off bipap on 4 Liters.

## 2021-05-29 NOTE — Evaluation (Signed)
Physical Therapy Evaluation Patient Details Name: Paula Torres MRN: [id number] DOB: 03/10/1951 Today's Date: 05/29/2021  History of Present Illness  71 y.o. female with medical history significant of dCHF, HLD, COPD on 3 L oxygen, GERD, depression, PAF on Eliquis, pulmonary HTN, OSA on BiPAP, CAD, iron deficiency anemia, CKD-3A, who presents with left leg wound with bleeding and pain.  Clinical Impression  Pt received supine in bed, agreeable to therapy. She was oriented to self, place and situation; unable to recite day of the week, month or year. Pt lives in single story home with her husband (currently in [place]) and daughter with 2 STE. She uses Rollator and 3L O2 at baseline. She also reports 3 mechanical falls over the past 1 month.   Pt performed bed mobility with SUP, STS from elevated surface with CGA (attempted stand from lowered surface with no lift at the hips) and ambulated <82f using RW and demo decreased safety awareness (See Ambulation). She lacks awareness of bladder and need to urinate. Increased rest breaks required throughout session to allow for SpO2 to recover to greater than 90%. PT did keep pt at 4L O2 today; pt was able to recover SpO2 within 2-3 minutes sitting EOB although pt continued to report SOB well after SpO2 was >90%. Pt was forgetful to task throughout session requiring reminders. PT rec STR as pt is limited in functional endurance and SpO2 declines rapidly with light mobility. Would benefit from skilled PT to address above deficits and promote optimal return to PLOF.      Recommendations for follow up therapy are one component of a multi-disciplinary discharge planning process, led by the attending physician.  Recommendations may be updated based on patient status, additional functional criteria and insurance authorization.  Follow Up Recommendations SNF;Supervision for mobility/OOB    Equipment Recommendations  Other (comment) (TBD at next venue of care)     Recommendations for Other Services       Precautions / Restrictions Precautions Precautions: Fall Precaution Comments: monitor O2 sats Restrictions Weight Bearing Restrictions: No Other Position/Activity Restrictions: BLE L>R wounds      Mobility  Bed Mobility Overal bed mobility: Needs Assistance Bed Mobility: Supine to Sit;Sit to Supine     Supine to sit: Supervision Sit to supine: Supervision   General bed mobility comments: Increased time and effort to perform. SUP for safety. Bed features used.    Transfers Overall transfer level: Needs assistance Equipment used: Rolling walker (2 wheeled) Transfers: Sit to/from Stand Sit to Stand: From elevated surface;Min guard         General transfer comment: CGA from elevated EOB - pt attempted to stand from lowered surface with no lift of hips.  Ambulation/Gait Ambulation/Gait assistance: Min guard Gait Distance (Feet): 2 Feet Assistive device: Rolling walker (2 wheeled) Gait Pattern/deviations: Step-to pattern;Decreased stance time - left;Decreased step length - right;Decreased step length - left;Decreased stride length;Trunk flexed;Antalgic Gait velocity: decreased   General Gait Details: Pt ambulated forward <261falong EOB before stating she was "done walking". Pt abruptly started taking backward steps to return to original seated position rather than listening to PT to pivot and sit on EOB further towards foot of bed. Additional CGA required for safety during backward steps with PT guiding hips to ensure contact with bed.  Stairs            Wheelchair Mobility    Modified Rankin (Stroke Patients Only)       Balance Overall balance assessment: Needs assistance Sitting-balance support: Feet  supported;No upper extremity supported Sitting balance-Leahy Scale: Fair     Standing balance support: Bilateral upper extremity supported Standing balance-Leahy Scale: Poor Standing balance comment: CGA and RW  required for standing stability - static and dynamic                             Pertinent Vitals/Pain Pain Assessment: 0-10 Pain Score: 9  Faces Pain Scale: Hurts a little bit Pain Location: BLE Pain Descriptors / Indicators: Aching;Grimacing Pain Intervention(s): Limited activity within patient's tolerance;Monitored during session;Repositioned    Home Living Family/patient expects to be discharged to:: Private residence Living Arrangements: Spouse/significant other;Children Available Help at Discharge: Family;Available 24 hours/day Type of Home: House Home Access: Stairs to enter Entrance Stairs-Rails: Can reach both Entrance Stairs-Number of Steps: 2 Home Layout: One level Home Equipment: Walker - 4 wheels;Bedside commode;Shower seat Additional Comments: husband currently still at [employer], DTR home all the time, Rollator for ambulation at baseline    Prior Function Level of Independence: Independent with assistive device(s)         Comments: Prior to recent admission ~65moago, pt was mod indep with ADL and mobility using 4WW and PRN light assist from dtr for LB dressing and IADL such as medications, driving. Dtr's boyfriend assists with meals. Over the past week, pt has required increased assist for transfers from her lift recliner (states she forgets that it lifts), dressing, and bathing, becoming very winded after minimal exertion. She reports 3 falls over the past month (since recent admission) due to LWalsh     Hand Dominance   Dominant Hand: Right    Extremity/Trunk Assessment   Upper Extremity Assessment Upper Extremity Assessment: Generalized weakness    Lower Extremity Assessment Lower Extremity Assessment: Generalized weakness (Abrasions on BLE, L>R. Eschar over L knee. 4/5 MMT B hip flexors, 5/5 B knee ext and DF.)    Cervical / Trunk Assessment Cervical / Trunk Assessment: Kyphotic  Communication   Communication: No difficulties  Cognition  Arousal/Alertness: Awake/alert Behavior During Therapy: WFL for tasks assessed/performed Overall Cognitive Status: No family/caregiver present to determine baseline cognitive functioning                                 General Comments: Pt alert and oriented to self, situation, place, but states it's 9/23 when asked date. Follows commands during session with occasioanl reminders of task.      General Comments General comments (skin integrity, edema, etc.): On 4L upon arrival. Pt remained on 4L through session. SpO2 decreased to 78% at the lowest with standing mobility; she recovered on 4L sitting EOB within 3 minutes with PT guiding pursed lip breathing. VC to refrain from talking until SpO2 increased to at least 88%.    Exercises Other Exercises Other Exercises: Pt instructed in pursed lip breathing to support breath recovery after exertional activity. Increased recovery time required for SpO2 to improve with increased supplemental O2 needed. Other Exercises: Pt instructed in AE/DME for improved safety/indep with seated showers.   Assessment/Plan    PT Assessment Patient needs continued PT services  PT Problem List Decreased strength;Decreased mobility;Decreased safety awareness;Decreased range of motion;Decreased activity tolerance;Decreased balance;Cardiopulmonary status limiting activity       PT Treatment Interventions DME instruction;Therapeutic exercise;Gait training;Balance training;Stair training;Neuromuscular re-education;Functional mobility training;Therapeutic activities;Patient/family education    PT Goals (Current goals can be found in the Care Plan section)  Acute Rehab PT Goals Patient Stated Goal: to go home PT Goal Formulation: With patient Time For Goal Achievement: 06/22/2021 Potential to Achieve Goals: Fair    Frequency Min 2X/week   Barriers to discharge Decreased caregiver support;Inaccessible home environment Pt unable to ascend 2 steps.  Daughter is present at home however pt is demo limited mobility and SpO2 dropping to below 80% with light activity.    Co-evaluation               AM-PAC PT "6 Clicks" Mobility  Outcome Measure Help needed turning from your back to your side while in a flat bed without using bedrails?: A Little Help needed moving from lying on your back to sitting on the side of a flat bed without using bedrails?: A Little Help needed moving to and from a bed to a chair (including a wheelchair)?: A Little Help needed standing up from a chair using your arms (e.g., wheelchair or bedside chair)?: A Little Help needed to walk in hospital room?: A Little Help needed climbing 3-5 steps with a railing? : Total 6 Click Score: 16    End of Session Equipment Utilized During Treatment: Gait belt Activity Tolerance: Patient limited by fatigue;Patient limited by pain Patient left: in bed;with call bell/phone within reach;with bed alarm set;Other (comment) (MD in room) Nurse Communication: Mobility status;Precautions PT Visit Diagnosis: Unsteadiness on feet (R26.81);Other abnormalities of gait and mobility (R26.89);Repeated falls (R29.6);Muscle weakness (generalized) (M62.81);Difficulty in walking, not elsewhere classified (R26.2);Pain Pain - Right/Left: Left (BLE) Pain - part of body: Knee;Leg    Time: 1100-1150 PT Time Calculation (min) (ACUTE ONLY): 50 min   Charges:   PT Evaluation $PT Eval Moderate Complexity: 1 Mod PT Treatments $Therapeutic Activity: 38-52 mins        Patrina Levering PT, DPT 05/29/21 12:39 PM [phone]   Paula Torres 05/29/2021, 12:28 PM

## 2021-05-29 NOTE — ED Notes (Signed)
Pt using BR. OT at bedside assessing pt at this time.

## 2021-05-29 NOTE — Progress Notes (Signed)
PROGRESS NOTE    Paula Torres   UKG:[id number]  DOB: 03-Jan-1951  PCP: Rusty Aus, MD    DOA: 05/28/2021 LOS: 1    Brief Narrative / Hospital Course to Date:   70 year old female with past medical history of diastolic CHF, hyperlipidemia, COPD chronically on 3 L/min supplemental oxygen, GERD, depression, paroxysmal A. fib on Eliquis, pulmonary hypertension, OSA noncompliant with BiPAP (supposed to use BiPAP but does not tolerate), iron deficiency anemia, CKD stage IIIa who presented to the ED on 05/28/2021 with progressive leg pain with bleeding and oozing after a mechanical fall 3 days prior.  Evaluation in the ED revealed severe sepsis secondary to left lower extremity wound infection and cellulitis.  Admitted to [facility] service and started on empiric IV antibiotics pending cultures.  Patient also with signs of acute COPD exacerbation started on IV steroids and bronchodilators.  Assessment & Plan   Principal Problem:   Wound infection_left lower leg Active Problems:   Acute on chronic respiratory failure with hypoxia (HCC)   AF (paroxysmal atrial fibrillation) (HCC)   Hyperlipidemia   COPD exacerbation (HCC)   Pulmonary hypertension (HCC)   Depression   Severe sepsis (HCC)   Chronic diastolic CHF (congestive heart failure) (HCC)   Acute renal failure superimposed on stage 3a chronic kidney disease (HCC)   Hyponatremia   Hypokalemia   Iron deficiency anemia   Cellulitis of left lower leg   Severe sepsis secondary to left lower extremity cellulitis and wound infection -patient met criteria for severe sepsis on admission with leukocytosis, tachypnea, lactic acidosis consistent with organ dysfunction.  Treated per sepsis protocol in the ED. --Continue empiric vancomycin and Rocephin --Follow blood cultures --Wound care consult -see their wound care instructions --Off IV fluids --Follow-up Doppler ultrasound to rule out DVT --Supportive care with antiemetics and  analgesics  Acute on chronic respiratory failure with hypoxia secondary to COPD with acute exacerbation -patient required 3 L/min supplemental oxygen at baseline.  Has advanced COPD and BiPAP recommended but patient states does not tolerate. --Continue IV steroids --Continue bronchodilators --On antibiotics as above --Follow sputum culture --Insert spirometer --Mucinex for cough -Supplement oxygen to maintain sats above 88%, wean as tolerated --BiPAP as needed  Hyponatremia -present with sodium 129, likely due to poor p.o. intake, dehydration.  Hold Lasix, spironolactone, metolazone.  Started on salt tablets on admission, continue for now.  Monitor BMP.  Further work-up if not improving  Paroxysmal A. Fib -rate controlled.  Eliquis on hold due to wound bleeding.  Continue bisoprolol.  Monitor on telemetry.  Monitor and replace potassium and magnesium as needed  Chronic diastolic CHF -prior echo on 01/21/2020 with EF 60 to 65%.  Patient volume status difficult to assess on exam due to morbid obesity with chest x-ray was without any pulmonary edema.  Appears overall compensated. --Lasix held on admission due to worsened renal function and hyponatremia --Monitor volume status and resume diuretic when appropriate --Daily weights  AKI superimposed on CKD stage IIIa -creatinine appears 1.0-1.2.  Presented with creatinine 2.11 and BUN 64, likely due to dehydration with severe sepsis in addition to being on Lasix.  She was treated with IV fluids in the ED. -- Hold Lasix --Toxins and hypotension --Monitor BMP daily  Pulmonary hypertension -continue treprostinil  Depression -continue Lexapro  Hyperlipidemia -continue Lipitor  Acute on chronic iron deficiency anemia -hemoglobin in August was 10.3, presented with hemoglobin 7.9.  Suspect blood loss with wound bleeding. --Follow-up FOBT and anemia panel --Hold Eliquis --Patient agreeable  to transfusion if needed --Monitor hemoglobin and  transfuse if less than 7.0  OSA -does not tolerate BiPAP at home, required it on admission but is off BiPAP today.  Continue BiPAP as needed and supplemental oxygen.  Patient BMI: Body mass index is 49.92 kg/m.   DVT prophylaxis: SCDs Start: 05/28/21 2024   Diet:  Diet Orders (From admission, onward)     Start     Ordered   05/28/21 1156  Diet regular Room service appropriate? Yes; Fluid consistency: Thin  Diet effective now       Question Answer Comment  Room service appropriate? Yes   Fluid consistency: Thin      05/28/21 1155              Code Status: Full Code   Subjective 05/29/21    Patient seen in the ED while holding for a bed today.  She reports overall feeling okay.  Sustained the wound to the left knee when she fell trying to get to her Rollator at home.  She denies fevers or chills today.  Appears to be pursed lip breathing but denies shortness of breath.  No other acute complaints.  She is agreeable to blood transfusion if needed   Disposition Plan & Communication   Status is: Inpatient  Remains inpatient appropriate because:IV treatments appropriate due to intensity of illness or inability to take PO  Dispo: The patient is from: Home              Anticipated d/c is to: Home versus SNF              Patient currently is not medically stable to d/c.   Difficult to place patient No   Family Communication: None at bedside on rounds, will did call   Consults, Procedures, Significant Events   Consultants:  None  Procedures:  None  Antimicrobials:  Anti-infectives (From admission, onward)    Start     Dose/Rate Route Frequency Ordered Stop   05/29/21 1100  cefTRIAXone (ROCEPHIN) 2 g in sodium chloride 0.9 % 100 mL IVPB  Status:  Discontinued        2 g 200 mL/hr over 30 Minutes Intravenous Every 24 hours 05/28/21 1244 05/29/21 0248   05/29/21 0400  Ampicillin-Sulbactam (UNASYN) 3 g in sodium chloride 0.9 % 100 mL IVPB  Status:  Discontinued         3 g 200 mL/hr over 30 Minutes Intravenous Every 12 hours 05/29/21 0251 05/29/21 0353   05/29/21 0400  Ampicillin-Sulbactam (UNASYN) 3 g in sodium chloride 0.9 % 100 mL IVPB        3 g 200 mL/hr over 30 Minutes Intravenous Every 8 hours 05/29/21 0353     05/28/21 1309  vancomycin variable dose per unstable renal function (pharmacist dosing)  Status:  Discontinued         Does not apply See admin instructions 05/28/21 1309 05/29/21 0258   05/28/21 1200  vancomycin (VANCOREADY) IVPB 1500 mg/300 mL        1,500 mg 150 mL/hr over 120 Minutes Intravenous  Once 05/28/21 1102 05/28/21 1657   05/28/21 1100  vancomycin (VANCOCIN) IVPB 1000 mg/200 mL premix        1,000 mg 200 mL/hr over 60 Minutes Intravenous  Once 05/28/21 1056 05/28/21 1554   05/28/21 1100  cefTRIAXone (ROCEPHIN) 2 g in sodium chloride 0.9 % 100 mL IVPB        2 g 200 mL/hr over 30 Minutes Intravenous  Once 05/28/21 1056 05/28/21 1553         Micro    Objective   Vitals:   05/29/21 0030 05/29/21 0330 05/29/21 0530 05/29/21 0600  BP: (!) 103/45 (!) 115/58 122/69 (!) 110/48  Pulse: 65 65 63 (!) 46  Resp: 18 (!) 24 (!) 23 18  Temp:      TempSrc:      SpO2: 92% 92% 90% (!) 89%  Weight:      Height:        Intake/Output Summary (Last 24 hours) at 05/29/2021 0803 Last data filed at 05/29/2021 0559 Gross per 24 hour  Intake 100 ml  Output 750 ml  Net -650 ml   Filed Weights   05/28/21 0916  Weight: 136.1 kg    Physical Exam:  General exam: awake, alert, no acute distress, morbidly obese HEENT: atraumatic, clear conjunctiva, anicteric sclera, moist mucus membranes, hearing grossly normal  Respiratory system: Diminished due to body habitus, no wheezes appreciated, mildly increased respiratory effort, with pursed lip breathing at rest. Cardiovascular system: normal S1/S2, RRR, unable to assess JVD due to body habitus.   Gastroitestinal system: soft, nontender abdomen Central nervous system: A&O x3. no gross focal  neurologic deficits, normal speech Extremities: Left lower extremity in knee appear stable and unchanged from images in H&P exam section,  Skin: Erythematous left lower extremity with large eschar on the left knee, differential warmth of left lower extremity, no current weeping noted from wound Psychiatry: normal mood, congruent affect, judgement and insight appear normal  Labs   Data Reviewed: I have personally reviewed following labs and imaging studies  CBC: Recent Labs  Lab 05/28/21 0922 05/28/21 1246 05/29/21 0658  WBC 28.2*  --  28.3*  NEUTROABS 24.5*  --   --   HGB 7.9* 7.3* 7.2*  HCT 24.2* 23.3* 22.4*  MCV 82.9  --  82.1  PLT 399  --  809   Basic Metabolic Panel: Recent Labs  Lab 05/28/21 0922 05/29/21 0658  NA 129* 130*  K 3.1* 3.3*  CL 73* 77*  CO2 39* 38*  GLUCOSE 135* 181*  BUN 64* 57*  CREATININE 2.11* 1.60*  CALCIUM 9.1 9.0  MG 2.3  --    GFR: Estimated Creatinine Clearance: 46.4 mL/min (A) (by C-G formula based on SCr of 1.6 mg/dL (H)). Liver Function Tests: Recent Labs  Lab 05/28/21 0922  AST 15  ALT 8  ALKPHOS 101  BILITOT 1.2  PROT 6.6  ALBUMIN 3.2*   No results for input(s): LIPASE, AMYLASE in the last 168 hours. No results for input(s): AMMONIA in the last 168 hours. Coagulation Profile: No results for input(s): INR, PROTIME in the last 168 hours. Cardiac Enzymes: No results for input(s): CKTOTAL, CKMB, CKMBINDEX, TROPONINI in the last 168 hours. BNP (last 3 results) No results for input(s): PROBNP in the last 8760 hours. HbA1C: No results for input(s): HGBA1C in the last 72 hours. CBG: No results for input(s): GLUCAP in the last 168 hours. Lipid Profile: No results for input(s): CHOL, HDL, LDLCALC, TRIG, CHOLHDL, LDLDIRECT in the last 72 hours. Thyroid Function Tests: No results for input(s): TSH, T4TOTAL, FREET4, T3FREE, THYROIDAB in the last 72 hours. Anemia Panel: No results for input(s): VITAMINB12, FOLATE, FERRITIN, TIBC,  IRON, RETICCTPCT in the last 72 hours. Sepsis Labs: Recent Labs  Lab 05/28/21 0922 05/28/21 1056  PROCALCITON 0.36  --   LATICACIDVEN 2.5* 1.7    Recent Results (from the past 240 hour(s))  Blood culture (routine  x 2)     Status: None (Preliminary result)   Collection Time: 05/28/21 10:56 AM   Specimen: BLOOD  Result Value Ref Range Status   Specimen Description BLOOD RIGHT ANTECUBITAL  Final   Special Requests   Final    BOTTLES DRAWN AEROBIC AND ANAEROBIC Blood Culture adequate volume   Culture   Final    NO GROWTH < 24 HOURS Performed at [facility], [address] [place], [place] [address]    Report Status PENDING  Incomplete  Blood culture (routine x 2)     Status: None (Preliminary result)   Collection Time: 05/28/21 10:57 AM   Specimen: BLOOD  Result Value Ref Range Status   Specimen Description BLOOD BLOOD LEFT FOREARM  Final   Special Requests   Final    BOTTLES DRAWN AEROBIC AND ANAEROBIC Blood Culture results may not be optimal due to an inadequate volume of blood received in culture bottles   Culture  Setup Time   Final    Organism ID to follow GRAM NEGATIVE COCCOBACILLI IN BOTH AEROBIC AND ANAEROBIC BOTTLES CRITICAL RESULT CALLED TO, READ BACK BY AND VERIFIED WITH: AUSTIN REEVES @ 2327 05/28/21 LFD Performed at [facility], [address] [place], [place] [address]    Culture GRAM NEGATIVE COCCOBACILLI  Final   Report Status PENDING  Incomplete  Blood Culture ID Panel (Reflexed)     Status: Abnormal (Preliminary result)   Collection Time: 05/28/21 10:57 AM  Result Value Ref Range Status   Enterococcus faecalis NOT DETECTED NOT DETECTED Final   Enterococcus Faecium NOT DETECTED NOT DETECTED Final   Listeria monocytogenes NOT DETECTED NOT DETECTED Final   Staphylococcus species NOT DETECTED NOT DETECTED Final   Staphylococcus aureus (BCID) PENDING NOT DETECTED Incomplete   Staphylococcus epidermidis NOT DETECTED NOT DETECTED Final    Staphylococcus lugdunensis NOT DETECTED NOT DETECTED Final   Streptococcus species NOT DETECTED NOT DETECTED Final   Streptococcus agalactiae NOT DETECTED NOT DETECTED Final   Streptococcus pneumoniae NOT DETECTED NOT DETECTED Final   Streptococcus pyogenes NOT DETECTED NOT DETECTED Final   A.calcoaceticus-baumannii DETECTED (A) NOT DETECTED Final    Comment: CRITICAL RESULT CALLED TO, READ BACK BY AND VERIFIED WITH: AUSTIN REEVES @ 2327 05/28/21 LFD    Bacteroides fragilis NOT DETECTED NOT DETECTED Final   Enterobacterales NOT DETECTED NOT DETECTED Final   Enterobacter cloacae complex NOT DETECTED NOT DETECTED Final   Escherichia coli NOT DETECTED NOT DETECTED Final   Klebsiella aerogenes NOT DETECTED NOT DETECTED Final   Klebsiella oxytoca NOT DETECTED NOT DETECTED Final   Klebsiella pneumoniae NOT DETECTED NOT DETECTED Final   Proteus species NOT DETECTED NOT DETECTED Final   Salmonella species NOT DETECTED NOT DETECTED Final   Serratia marcescens NOT DETECTED NOT DETECTED Final   Haemophilus influenzae NOT DETECTED NOT DETECTED Final   Neisseria meningitidis NOT DETECTED NOT DETECTED Final   Pseudomonas aeruginosa NOT DETECTED NOT DETECTED Final   Stenotrophomonas maltophilia NOT DETECTED NOT DETECTED Final   Candida albicans NOT DETECTED NOT DETECTED Final   Candida auris NOT DETECTED NOT DETECTED Final   Candida glabrata NOT DETECTED NOT DETECTED Final   Candida krusei NOT DETECTED NOT DETECTED Final   Candida parapsilosis NOT DETECTED NOT DETECTED Final   Candida tropicalis NOT DETECTED NOT DETECTED Final   Cryptococcus neoformans/gattii NOT DETECTED NOT DETECTED Final   CTX-M ESBL NOT DETECTED NOT DETECTED Final   Carbapenem resistance IMP NOT DETECTED NOT DETECTED Final   Carbapenem resistance  KPC NOT DETECTED NOT DETECTED Final   Carbapenem resistance NDM NOT DETECTED NOT DETECTED Final   Carbapenem resistance VIM NOT DETECTED NOT DETECTED Final    Comment: Performed at  [facility], [address] [place], [place] [address]  Resp Panel by RT-PCR (Flu A&B, Covid) Nasopharyngeal Swab     Status: None   Collection Time: 05/28/21 12:46 PM   Specimen: Nasopharyngeal Swab; Nasopharyngeal(NP) swabs in vial transport medium  Result Value Ref Range Status   SARS Coronavirus 2 by RT PCR NEGATIVE NEGATIVE Final    Comment: (NOTE) SARS-CoV-2 target nucleic acids are NOT DETECTED.  The SARS-CoV-2 RNA is generally detectable in upper respiratory specimens during the acute phase of infection. The lowest concentration of SARS-CoV-2 viral copies this assay can detect is 138 copies/mL. A negative result does not preclude SARS-Cov-2 infection and should not be used as the sole basis for treatment or other patient management decisions. A negative result may occur with  improper specimen collection/handling, submission of specimen other than nasopharyngeal swab, presence of viral mutation(s) within the areas targeted by this assay, and inadequate number of viral copies(<138 copies/mL). A negative result must be combined with clinical observations, patient history, and epidemiological information. The expected result is Negative.  Fact Sheet for Patients:  EntrepreneurPulse.com.au  Fact Sheet for Healthcare Providers:  IncredibleEmployment.be  This test is no t yet approved or cleared by the [place] FDA and  has been authorized for detection and/or diagnosis of SARS-CoV-2 by FDA under an Emergency Use Authorization (EUA). This EUA will remain  in effect (meaning this test can be used) for the duration of the COVID-19 declaration under Section 564(b)(1) of the Act, 21 U.S.C.section 360bbb-3(b)(1), unless the authorization is terminated  or revoked sooner.       Influenza A by PCR NEGATIVE NEGATIVE Final   Influenza B by PCR NEGATIVE NEGATIVE Final    Comment: (NOTE) The Xpert Xpress SARS-CoV-2/FLU/RSV plus assay  is intended as an aid in the diagnosis of influenza from Nasopharyngeal swab specimens and should not be used as a sole basis for treatment. Nasal washings and aspirates are unacceptable for Xpert Xpress SARS-CoV-2/FLU/RSV testing.  Fact Sheet for Patients: EntrepreneurPulse.com.au  Fact Sheet for Healthcare Providers: IncredibleEmployment.be  This test is not yet approved or cleared by the [place] FDA and has been authorized for detection and/or diagnosis of SARS-CoV-2 by FDA under an Emergency Use Authorization (EUA). This EUA will remain in effect (meaning this test can be used) for the duration of the COVID-19 declaration under Section 564(b)(1) of the Act, 21 U.S.C. section 360bbb-3(b)(1), unless the authorization is terminated or revoked.  Performed at [facility], [address] [place], [place] [address]       Imaging Studies   CT HEAD WO CONTRAST (5MM)  Result Date: 05/28/2021 CLINICAL DATA:  Delirium. EXAM: CT HEAD WITHOUT CONTRAST TECHNIQUE: Contiguous axial images were obtained from the base of the skull through the vertex without intravenous contrast. COMPARISON:  CT head 04/25/2021 BRAIN: BRAIN Patchy and confluent areas of decreased attenuation are noted throughout the deep and periventricular white matter of the cerebral hemispheres bilaterally, compatible with chronic microvascular ischemic disease. No evidence of large-territorial acute infarction. No parenchymal hemorrhage. No mass lesion. No extra-axial collection. No mass effect or midline shift. No hydrocephalus. Basilar cisterns are patent. Vascular: No hyperdense vessel. Atherosclerotic calcifications are present within the cavernous internal carotid arteries. Skull: No acute fracture or focal lesion. Sinuses/Orbits: Paranasal sinuses and mastoid air  cells are clear. The orbits are unremarkable. Other: None. IMPRESSION: No acute intracranial abnormality.  Electronically Signed   By: Iven Finn M.D.   On: 05/28/2021 18:00   US Venous Img Lower Unilateral Left (DVT)  Result Date: 05/28/2021 CLINICAL DATA:  Recent fall, cellulitis, pain and swelling EXAM: LEFT LOWER EXTREMITY VENOUS DOPPLER ULTRASOUND TECHNIQUE: Gray-scale sonography with compression, as well as color and duplex ultrasound, were performed to evaluate the deep venous system(s) from the level of the common femoral vein through the popliteal and proximal calf veins. COMPARISON:  None. FINDINGS: VENOUS Normal compressibility of the common femoral, superficial femoral, and popliteal veins, as well as the visualized calf veins. Visualized portions of profunda femoral vein and great saphenous vein unremarkable. No filling defects to suggest DVT on grayscale or color Doppler imaging. Doppler waveforms show normal direction of venous flow, normal respiratory plasticity and response to augmentation. Limited views of the contralateral common femoral vein are unremarkable. OTHER None. Limitations: none IMPRESSION: Negative examination for deep venous thrombosis in the left lower extremity. Electronically Signed   By: Eddie Candle M.D.   On: 05/28/2021 14:37   DG Chest Port 1 View  Result Date: 05/28/2021 CLINICAL DATA:  Multiple falls, generalized weakness for about a week. Bilateral lower extremity edema. Is on oxygen at home. Hx of CHF, COPD. EXAM: PORTABLE CHEST 1 VIEW COMPARISON:  04/26/2021 FINDINGS: Mild enlargement of the cardiac silhouette, stable. No mediastinal or hilar masses. Prominent vascular markings similar to the prior study. Linear opacity at the left lung base consistent with atelectasis or scarring. No lung consolidation. No convincing edema. No visualized pleural effusion and no evidence of a pneumothorax. 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 IMPRESSION: 1. No acute cardiopulmonary disease. Electronically Signed   By: Lajean Manes M.D.   On: 05/28/2021  12:22     Medications   Scheduled Meds:  atorvastatin  20 mg Oral QHS   bisoprolol  5 mg Oral QPM   collagenase   Topical Daily   escitalopram  20 mg Oral Daily   famotidine  20 mg Oral QHS   ferrous gluconate  324 mg Oral Weekly   ipratropium-albuterol  3 mL Nebulization Q4H   loratadine  10 mg Oral QHS   methylPREDNISolone (SOLU-MEDROL) injection  60 mg Intravenous BID   montelukast  10 mg Oral QHS   sodium chloride  2 g Oral BID WC   Continuous Infusions:  sodium chloride Stopped (05/28/21 1600)   ampicillin-sulbactam (UNASYN) IV Stopped (05/29/21 0559)       LOS: 1 day    Time spent: 30 minutes    Ezekiel Slocumb, DO [facility]  05/29/2021, 8:03 AM      If 7PM-7AM, please contact night-coverage. How to contact the [facility] Attending or Consulting provider [place] or covering provider during after hours [place], for this patient?    Check the care team in [facility] and look for a) attending/consulting TRH provider listed and b) the [facility] team listed Log into [email or web address] and use [facility]'s universal password to access. If you do not have the password, please contact the hospital operator. Locate the [facility] provider you are looking for under [facility] and page to a number that you can be directly reached. If you still have difficulty reaching the provider, please page the [facility] (Director on Call) for the Hospitalists listed on amion for assistance.

## 2021-05-30 DIAGNOSIS — I480 Paroxysmal atrial fibrillation: Secondary | ICD-10-CM | POA: Diagnosis not present

## 2021-05-30 DIAGNOSIS — R7881 Bacteremia: Secondary | ICD-10-CM

## 2021-05-30 DIAGNOSIS — T148XXA Other injury of unspecified body region, initial encounter: Secondary | ICD-10-CM

## 2021-05-30 DIAGNOSIS — Z8679 Personal history of other diseases of the circulatory system: Secondary | ICD-10-CM

## 2021-05-30 DIAGNOSIS — L03116 Cellulitis of left lower limb: Secondary | ICD-10-CM

## 2021-05-30 DIAGNOSIS — L089 Local infection of the skin and subcutaneous tissue, unspecified: Secondary | ICD-10-CM | POA: Diagnosis not present

## 2021-05-30 LAB — FERRITIN: Ferritin: 78 ng/mL (ref 11–307)

## 2021-05-30 LAB — CBC
HCT: 21.6 % — ABNORMAL LOW (ref 36.0–46.0)
Hemoglobin: 6.8 g/dL — ABNORMAL LOW (ref 12.0–15.0)
MCH: 27.1 pg (ref 26.0–34.0)
MCHC: 31.5 g/dL (ref 30.0–36.0)
MCV: 86.1 fL (ref 80.0–100.0)
Platelets: 382 10*3/uL (ref 150–400)
RBC: 2.51 MIL/uL — ABNORMAL LOW (ref 3.87–5.11)
RDW: 15.9 % — ABNORMAL HIGH (ref 11.5–15.5)
WBC: 23.7 10*3/uL — ABNORMAL HIGH (ref 4.0–10.5)
nRBC: 0.1 % (ref 0.0–0.2)

## 2021-05-30 LAB — HEMOGLOBIN AND HEMATOCRIT, BLOOD
HCT: 25.9 % — ABNORMAL LOW (ref 36.0–46.0)
Hemoglobin: 8.3 g/dL — ABNORMAL LOW (ref 12.0–15.0)

## 2021-05-30 LAB — BASIC METABOLIC PANEL
Anion gap: 15 (ref 5–15)
BUN: 59 mg/dL — ABNORMAL HIGH (ref 8–23)
CO2: 38 mmol/L — ABNORMAL HIGH (ref 22–32)
Calcium: 9 mg/dL (ref 8.9–10.3)
Chloride: 82 mmol/L — ABNORMAL LOW (ref 98–111)
Creatinine, Ser: 1.26 mg/dL — ABNORMAL HIGH (ref 0.44–1.00)
GFR, Estimated: 46 mL/min — ABNORMAL LOW (ref >60)
Glucose, Bld: 186 mg/dL — ABNORMAL HIGH (ref 70–99)
Potassium: 3.5 mmol/L (ref 3.5–5.1)
Sodium: 135 mmol/L (ref 135–145)

## 2021-05-30 LAB — BLOOD GAS, ARTERIAL
Acid-Base Excess: 18.3 mmol/L — ABNORMAL HIGH (ref 0.0–2.0)
Bicarbonate: 47.3 mmol/L — ABNORMAL HIGH (ref 20.0–28.0)
Expiratory PAP: 6
FIO2: 0.32
Inspiratory PAP: 15
Mode: POSITIVE
O2 Saturation: 88.1 %
Patient temperature: 37
RATE: 8 resp/min
pCO2 arterial: 80 mmHg (ref 32.0–48.0)
pH, Arterial: 7.38 (ref 7.350–7.450)
pO2, Arterial: 56 mmHg — ABNORMAL LOW (ref 83.0–108.0)

## 2021-05-30 LAB — PREPARE RBC (CROSSMATCH)

## 2021-05-30 LAB — IRON AND TIBC
Iron: 26 ug/dL — ABNORMAL LOW (ref 28–170)
Saturation Ratios: 6 % — ABNORMAL LOW (ref 10.4–31.8)
TIBC: 403 ug/dL (ref 250–450)
UIBC: 377 ug/dL

## 2021-05-30 LAB — ABO/RH: ABO/RH(D): A NEG

## 2021-05-30 LAB — VITAMIN B12: Vitamin B-12: 343 pg/mL (ref 180–914)

## 2021-05-30 LAB — FOLATE: Folate: 5.3 ng/mL — ABNORMAL LOW (ref >5.9)

## 2021-05-30 LAB — MAGNESIUM: Magnesium: 2.8 mg/dL — ABNORMAL HIGH (ref 1.7–2.4)

## 2021-05-30 LAB — RETICULOCYTES
Immature Retic Fract: 35.7 % — ABNORMAL HIGH (ref 2.3–15.9)
RBC.: 2.49 MIL/uL — ABNORMAL LOW (ref 3.87–5.11)
Retic Count, Absolute: 128.2 10*3/uL (ref 19.0–186.0)
Retic Ct Pct: 5.2 % — ABNORMAL HIGH (ref 0.4–3.1)

## 2021-05-30 MED ORDER — FLUTICASONE-UMECLIDIN-VILANT 100-62.5-25 MCG/INH IN AEPB: 1.0000 | INHALATION_SPRAY | Freq: Every day | RESPIRATORY_TRACT | Status: DC

## 2021-05-30 MED ORDER — BUDESONIDE 0.5 MG/2ML IN SUSP
0.5000 mg | Freq: Two times a day (BID) | RESPIRATORY_TRACT | Status: DC
  Administered 2021-05-30 – 2021-06-04 (×9): 0.5 mg via RESPIRATORY_TRACT
  Filled 2021-05-30 (×10): qty 2

## 2021-05-30 MED ORDER — MOMETASONE FURO-FORMOTEROL FUM 100-5 MCG/ACT IN AERO
2.0000 | INHALATION_SPRAY | Freq: Two times a day (BID) | RESPIRATORY_TRACT | Status: DC
  Administered 2021-05-30 – 2021-06-08 (×15): 2 via RESPIRATORY_TRACT
  Filled 2021-05-30 (×3): qty 8.8

## 2021-05-30 MED ORDER — IPRATROPIUM-ALBUTEROL 0.5-2.5 (3) MG/3ML IN SOLN
3.0000 mL | RESPIRATORY_TRACT | Status: DC
  Administered 2021-05-30 – 2021-06-01 (×9): 3 mL via RESPIRATORY_TRACT
  Filled 2021-05-30 (×10): qty 3

## 2021-05-30 MED ORDER — IPRATROPIUM-ALBUTEROL 0.5-2.5 (3) MG/3ML IN SOLN: 3.0000 mL | Freq: Two times a day (BID) | RESPIRATORY_TRACT | Status: DC

## 2021-05-30 MED ORDER — IPRATROPIUM-ALBUTEROL 0.5-2.5 (3) MG/3ML IN SOLN
3.0000 mL | Freq: Four times a day (QID) | RESPIRATORY_TRACT | Status: DC
  Administered 2021-05-30 (×2): 3 mL via RESPIRATORY_TRACT
  Filled 2021-05-30: qty 3

## 2021-05-30 MED ORDER — UMECLIDINIUM BROMIDE 62.5 MCG/INH IN AEPB
1.0000 | INHALATION_SPRAY | Freq: Every day | RESPIRATORY_TRACT | Status: DC
  Administered 2021-05-30 – 2021-06-08 (×7): 1 via RESPIRATORY_TRACT
  Filled 2021-05-30 (×2): qty 7

## 2021-05-30 MED ORDER — SODIUM CHLORIDE 0.9% IV SOLUTION: Freq: Once | INTRAVENOUS | Status: AC

## 2021-05-30 NOTE — Progress Notes (Signed)
OT Cancellation Note  Patient Details Name: Paula Torres MRN: [id number] DOB: 04-14-1951   Cancelled Treatment:    Reason Eval/Treat Not Completed: Medical issues which prohibited therapy. Chart reviewed. Pt noted with Hgb 6.8, blood transfusion pending. Will hold OT and re-attempt at later date/time as medically appropriate.   Ardeth Perfect., MPH, MS, OTR/L ascom [phone] 05/30/21, 11:38 AM

## 2021-05-30 NOTE — Consult Note (Signed)
NAMEHedwig Torres  DOB: [birth year]  MRN: [id number]  Date/Time: 05/30/2021 9:47 AM  REQUESTING PROVIDER: Dr.Griffith Subjective:  REASON FOR CONSULT: Acinetobacter bacteremia ?No history from aptient- chart reviewed, spoke to daughter at bed side Paula Torres is a 70 y.o. with a history of COPD, Corpulmonale, HTN, OSA, PAF, pulmonary hypertension presented from home on 05/28/21 with fallsm weakness, weeping leg wounds on the left As per daughter, patient fell on 9/22 at home while coming from bathroom and landed on her left knee- she bruised the knee, and  as she was on eliquis She came to the ED on 9/22. Xray did not show any fracture- she was discharged home  The hematoma got worse, and the skin started to break down and weep  The daughter applied bandaid= pt fell again onher back on 05/28/21 and was brought to the ED Her left leg looked infected and that was another reason she came to the ED as well In the ED temp 98.5, BP 107/58, HR 63, sats 93% on RA. Wbc 28.2, HB 7.3, PLT 399 , cr 2.11. venous blood G showed Ph 7.41, Pco2 71 and po2 53. CT head no acute abnormality.CXR normal She was started on vanco and ceftriaxone. Blood culture positive for acinetobacter and I am asked to see the patient As per daughter pt sleeps a lot and sometimes will not respond at all One of the fall dislocated the left shoulder many months ago but surgery was not done because of comorbidities  Past Medical History:  Diagnosis Date   (HFpEF) heart failure with preserved ejection fraction (HCC)    Chronic respiratory failure with hypoxia (HCC)    COPD (chronic obstructive pulmonary disease) (HCC)    Coronary artery disease, non-occlusive    Hypertension    OSA (obstructive sleep apnea)    PAF (paroxysmal atrial fibrillation) ([place])    Pulmonary hypertension (HCC)     Past Surgical History:  Procedure Laterality Date   NO PAST SURGERIES     RIGHT/LEFT HEART CATH AND CORONARY ANGIOGRAPHY Left 12/21/2019    Procedure: RIGHT/LEFT HEART CATH AND CORONARY ANGIOGRAPHY;  Surgeon: Wellington Hampshire, MD;  Location: [facility];  Service: Cardiovascular;  Laterality: Left;    Social History   Socioeconomic History   Marital status: Married    Spouse name: Not on file   Number of children: Not on file   Years of education: Not on file   Highest education level: Not on file  Occupational History   Not on file  Tobacco Use   Smoking status: Former    Types: Cigarettes    Quit date: 06/03/2017    Years since quitting: 3.9   Smokeless tobacco: Never  Vaping Use   Vaping Use: Never used  Substance and Sexual Activity   Alcohol use: Not Currently   Drug use: Never   Sexual activity: Not on file  Other Topics Concern   Not on file  Social History Narrative   Not on file   Social Determinants of Health   Financial Resource Strain: Not on file  Food Insecurity: Not on file  Transportation Needs: Not on file  Physical Activity: Not on file  Stress: Not on file  Social Connections: Not on file  Intimate Partner Violence: Not on file    Family History  Problem Relation Age of Onset   Hypertension Other    Allergies  Allergen Reactions   Codeine     Back Headache   Sulfa Antibiotics  Reported to pt from mother who is no longer living -  Details unknown   I? Current Facility-Administered Medications  Medication Dose Route Frequency Provider Last Rate Last Admin   0.9 %  sodium chloride infusion (Manually program via Guardrails IV Fluids)   Intravenous Once Nicole Kindred A, DO       0.9 %  sodium chloride infusion   Intravenous PRN Ivor Costa, MD   Stopped at 05/28/21 1600   acetaminophen (TYLENOL) tablet 650 mg  650 mg Oral Q6H PRN Ivor Costa, MD       albuterol (PROVENTIL) (2.5 MG/3ML) 0.083% nebulizer solution 2.5 mg  2.5 mg Nebulization Q4H PRN Ivor Costa, MD       Ampicillin-Sulbactam (UNASYN) 3 g in sodium chloride 0.9 % 100 mL IVPB  3 g Intravenous Q6H Chinita Greenland A, RPH 200 mL/hr at 05/30/21 0844 3 g at 05/30/21 0844   atorvastatin (LIPITOR) tablet 20 mg  20 mg Oral QHS Ivor Costa, MD   20 mg at 05/29/21 2136   bisoprolol (ZEBETA) tablet 5 mg  5 mg Oral QPM Ivor Costa, MD       collagenase (SANTYL) ointment   Topical Daily Ivor Costa, MD   Given at 05/30/21 7680   dextromethorphan-guaiFENesin ([place] DM) 30-600 MG per 12 hr tablet 1 tablet  1 tablet Oral BID PRN Ivor Costa, MD       docusate sodium (COLACE) capsule 100 mg  100 mg Oral Daily PRN Ivor Costa, MD       escitalopram (LEXAPRO) tablet 20 mg  20 mg Oral Daily Ivor Costa, MD   20 mg at 05/30/21 0835   famotidine (PEPCID) tablet 20 mg  20 mg Oral QHS Ivor Costa, MD   20 mg at 05/29/21 2136   ferrous gluconate (FERGON) tablet 324 mg  324 mg Oral Weekly Ivor Costa, MD   324 mg at 05/29/21 1328   hydrALAZINE (APRESOLINE) injection 5 mg  5 mg Intravenous Q2H PRN Ivor Costa, MD       ipratropium-albuterol (DUONEB) 0.5-2.5 (3) MG/3ML nebulizer solution 3 mL  3 mL Nebulization Q6H WA Griffith, Kelly A, DO       loratadine (CLARITIN) tablet 10 mg  10 mg Oral QHS Ivor Costa, MD   10 mg at 05/29/21 2136   LORazepam (ATIVAN) tablet 0.5 mg  0.5 mg Oral Q4H PRN Mansy, Jan A, MD       methylPREDNISolone sodium succinate (SOLU-MEDROL) 125 mg/2 mL injection 60 mg  60 mg Intravenous BID Ivor Costa, MD   60 mg at 05/30/21 0835   montelukast (SINGULAIR) tablet 10 mg  10 mg Oral QHS Ivor Costa, MD   10 mg at 05/29/21 2136   ondansetron (ZOFRAN) injection 4 mg  4 mg Intravenous Q8H PRN Ivor Costa, MD       oxyCODONE-acetaminophen (PERCOCET/ROXICET) 5-325 MG per tablet 1 tablet  1 tablet Oral Q4H PRN Ivor Costa, MD   1 tablet at 05/29/21 2132   sodium chloride tablet 2 g  2 g Oral BID WC Ivor Costa, MD   2 g at 05/30/21 8811     Abtx:  Anti-infectives (From admission, onward)    Start     Dose/Rate Route Frequency Ordered Stop   05/29/21 1100  cefTRIAXone (ROCEPHIN) 2 g in sodium chloride 0.9 % 100 mL IVPB   Status:  Discontinued        2 g 200 mL/hr over 30 Minutes Intravenous Every 24 hours 05/28/21 1244 05/29/21  5176   05/29/21 1000  Ampicillin-Sulbactam (UNASYN) 3 g in sodium chloride 0.9 % 100 mL IVPB        3 g 200 mL/hr over 30 Minutes Intravenous Every 6 hours 05/29/21 0825     05/29/21 0400  Ampicillin-Sulbactam (UNASYN) 3 g in sodium chloride 0.9 % 100 mL IVPB  Status:  Discontinued        3 g 200 mL/hr over 30 Minutes Intravenous Every 12 hours 05/29/21 0251 05/29/21 0353   05/29/21 0400  Ampicillin-Sulbactam (UNASYN) 3 g in sodium chloride 0.9 % 100 mL IVPB  Status:  Discontinued        3 g 200 mL/hr over 30 Minutes Intravenous Every 8 hours 05/29/21 0353 05/29/21 0825   05/28/21 1309  vancomycin variable dose per unstable renal function (pharmacist dosing)  Status:  Discontinued         Does not apply See admin instructions 05/28/21 1309 05/29/21 0258   05/28/21 1200  vancomycin (VANCOREADY) IVPB 1500 mg/300 mL        1,500 mg 150 mL/hr over 120 Minutes Intravenous  Once 05/28/21 1102 05/28/21 1657   05/28/21 1100  vancomycin (VANCOCIN) IVPB 1000 mg/200 mL premix        1,000 mg 200 mL/hr over 60 Minutes Intravenous  Once 05/28/21 1056 05/28/21 1554   05/28/21 1100  cefTRIAXone (ROCEPHIN) 2 g in sodium chloride 0.9 % 100 mL IVPB        2 g 200 mL/hr over 30 Minutes Intravenous  Once 05/28/21 1056 05/28/21 1553       REVIEW OF SYSTEMS:  NA Objective:  VITALS:  BP (!) 124/59 (BP Location: Right Arm)   Pulse 80   Temp 98.7 F (37.1 C)   Resp 17   Ht 5' 5" (1.651 m)   Wt 132.5 kg   SpO2 (!) 75%   BMI 48.62 kg/m  PHYSICAL EXAM:  General:somnolent, sitting in bed and nodding off Then she woke up after hearing her daughter's voice and spoke a little. Obese Nasal cannula oxygen Head: Normocephalic, without obvious abnormality, atraumatic. Eyes: Conjunctivae clear, anicteric sclerae. Pupils are equal ENT Nares normal. No drainage or sinus tenderness. Lips, mucosa, and  tongue normal. No Thrush Neck: Supple, symmetrical, no adenopathy, thyroid: non tender no carotid bruit and no JVD. Back: No CVA tenderness. Lungs: b/la air entry- basal crepts Heart: irregular Abdomen: Soft,  Extremities:left leg dresisng not removed but pictures seen      Extensive ecchymosis, hematoma, skin breakdown, weeping Skin: excoriations all over  Neurologic: cannot assess Pertinent Labs Lab Results CBC    Component Value Date/Time   WBC 23.7 (H) 05/30/2021 0501   RBC 2.49 (L) 05/30/2021 0501   RBC 2.51 (L) 05/30/2021 0501   HGB 6.8 (L) 05/30/2021 0501   HCT 21.6 (L) 05/30/2021 0501   PLT 382 05/30/2021 0501   MCV 86.1 05/30/2021 0501   MCH 27.1 05/30/2021 0501   MCHC 31.5 05/30/2021 0501   RDW 15.9 (H) 05/30/2021 0501   LYMPHSABS 1.4 05/28/2021 0922   MONOABS 1.6 (H) 05/28/2021 0922   EOSABS 0.1 05/28/2021 0922   BASOSABS 0.1 05/28/2021 0922    CMP Latest Ref Rng & Units 05/30/2021 05/29/2021 05/28/2021  Glucose 70 - 99 mg/dL 186(H) 181(H) 135(H)  BUN 8 - 23 mg/dL 59(H) 57(H) 64(H)  Creatinine 0.44 - 1.00 mg/dL 1.26(H) 1.60(H) 2.11(H)  Sodium 135 - 145 mmol/L 135 130(L) 129(L)  Potassium 3.5 - 5.1 mmol/L 3.5 3.3(L) 3.1(L)  Chloride 98 -  111 mmol/L 82(L) 77(L) 73(L)  CO2 22 - 32 mmol/L 38(H) 38(H) 39(H)  Calcium 8.9 - 10.3 mg/dL 9.0 9.0 9.1  Total Protein 6.5 - 8.1 g/dL - - 6.6  Total Bilirubin 0.3 - 1.2 mg/dL - - 1.2  Alkaline Phos 38 - 126 U/L - - 101  AST 15 - 41 U/L - - 15  ALT 0 - 44 U/L - - 8      Microbiology: Recent Results (from the past 240 hour(s))  Blood culture (routine x 2)     Status: None (Preliminary result)   Collection Time: 05/28/21 10:56 AM   Specimen: BLOOD  Result Value Ref Range Status   Specimen Description   Final    BLOOD RIGHT ANTECUBITAL Performed at [facility], [address], [place], [place] [address]    Special Requests   Final    BOTTLES DRAWN AEROBIC AND ANAEROBIC Blood Culture adequate  volume Performed at [facility], [address] [place], [place] [address]    Culture  Setup Time   Final    GRAM NEGATIVE COCCOBACILLI AEROBIC BOTTLE ONLY CRITICAL VALUE NOTED.  VALUE IS CONSISTENT WITH PREVIOUSLY REPORTED AND CALLED VALUE. Performed at [facility], [place] [address], [place], [place] [address]    Culture GRAM NEGATIVE COCCOBACILLI  Final   Report Status PENDING  Incomplete  Blood culture (routine x 2)     Status: Abnormal (Preliminary result)   Collection Time: 05/28/21 10:57 AM   Specimen: BLOOD  Result Value Ref Range Status   Specimen Description   Final    BLOOD BLOOD LEFT FOREARM Performed at [facility], [address] [place], [place] [address]    Special Requests   Final    BOTTLES DRAWN AEROBIC AND ANAEROBIC Blood Culture results may not be optimal due to an inadequate volume of blood received in culture bottles Performed at [facility], [address] [place], [place] [address]    Culture  Setup Time   Final    Organism ID to follow GRAM NEGATIVE COCCOBACILLI IN BOTH AEROBIC AND ANAEROBIC BOTTLES CRITICAL RESULT CALLED TO, READ BACK BY AND VERIFIED WITH: AUSTIN REEVES @ 2327 05/28/21 LFD Performed at [facility], [address], [place], [place] [address]    Culture ACINETOBACTER BAUMANNII (A)  Final   Report Status PENDING  Incomplete  Blood Culture ID Panel (Reflexed)     Status: Abnormal (Preliminary result)   Collection Time: 05/28/21 10:57 AM  Result Value Ref Range Status   Enterococcus faecalis NOT DETECTED NOT DETECTED Final   Enterococcus Faecium NOT DETECTED NOT DETECTED Final   Listeria monocytogenes NOT DETECTED NOT DETECTED Final   Staphylococcus species NOT DETECTED NOT DETECTED Final   Staphylococcus aureus (BCID) PENDING NOT DETECTED Incomplete   Staphylococcus epidermidis NOT DETECTED NOT DETECTED Final   Staphylococcus lugdunensis NOT DETECTED NOT DETECTED Final   Streptococcus  species NOT DETECTED NOT DETECTED Final   Streptococcus agalactiae NOT DETECTED NOT DETECTED Final   Streptococcus pneumoniae NOT DETECTED NOT DETECTED Final   Streptococcus pyogenes NOT DETECTED NOT DETECTED Final   A.calcoaceticus-baumannii DETECTED (A) NOT DETECTED Final    Comment: CRITICAL RESULT CALLED TO, READ BACK BY AND VERIFIED WITH: AUSTIN REEVES @ 2327 05/28/21 LFD    Bacteroides fragilis NOT DETECTED NOT DETECTED Final   Enterobacterales NOT DETECTED NOT DETECTED Final   Enterobacter cloacae complex NOT DETECTED NOT DETECTED Final   Escherichia coli NOT DETECTED NOT DETECTED Final  Klebsiella aerogenes NOT DETECTED NOT DETECTED Final   Klebsiella oxytoca NOT DETECTED NOT DETECTED Final   Klebsiella pneumoniae NOT DETECTED NOT DETECTED Final   Proteus species NOT DETECTED NOT DETECTED Final   Salmonella species NOT DETECTED NOT DETECTED Final   Serratia marcescens NOT DETECTED NOT DETECTED Final   Haemophilus influenzae NOT DETECTED NOT DETECTED Final   Neisseria meningitidis NOT DETECTED NOT DETECTED Final   Pseudomonas aeruginosa NOT DETECTED NOT DETECTED Final   Stenotrophomonas maltophilia NOT DETECTED NOT DETECTED Final   Candida albicans NOT DETECTED NOT DETECTED Final   Candida auris NOT DETECTED NOT DETECTED Final   Candida glabrata NOT DETECTED NOT DETECTED Final   Candida krusei NOT DETECTED NOT DETECTED Final   Candida parapsilosis NOT DETECTED NOT DETECTED Final   Candida tropicalis NOT DETECTED NOT DETECTED Final   Cryptococcus neoformans/gattii NOT DETECTED NOT DETECTED Final   CTX-M ESBL NOT DETECTED NOT DETECTED Final   Carbapenem resistance IMP NOT DETECTED NOT DETECTED Final   Carbapenem resistance KPC NOT DETECTED NOT DETECTED Final   Carbapenem resistance NDM NOT DETECTED NOT DETECTED Final   Carbapenem resistance VIM NOT DETECTED NOT DETECTED Final    Comment: Performed at [facility], [address] [place], [place] [address]  Resp  Panel by RT-PCR (Flu A&B, Covid) Nasopharyngeal Swab     Status: None   Collection Time: 05/28/21 12:46 PM   Specimen: Nasopharyngeal Swab; Nasopharyngeal(NP) swabs in vial transport medium  Result Value Ref Range Status   SARS Coronavirus 2 by RT PCR NEGATIVE NEGATIVE Final    Comment: (NOTE) SARS-CoV-2 target nucleic acids are NOT DETECTED.  The SARS-CoV-2 RNA is generally detectable in upper respiratory specimens during the acute phase of infection. The lowest concentration of SARS-CoV-2 viral copies this assay can detect is 138 copies/mL. A negative result does not preclude SARS-Cov-2 infection and should not be used as the sole basis for treatment or other patient management decisions. A negative result may occur with  improper specimen collection/handling, submission of specimen other than nasopharyngeal swab, presence of viral mutation(s) within the areas targeted by this assay, and inadequate number of viral copies(<138 copies/mL). A negative result must be combined with clinical observations, patient history, and epidemiological information. The expected result is Negative.  Fact Sheet for Patients:  EntrepreneurPulse.com.au  Fact Sheet for Healthcare Providers:  IncredibleEmployment.be  This test is no t yet approved or cleared by the [place] FDA and  has been authorized for detection and/or diagnosis of SARS-CoV-2 by FDA under an Emergency Use Authorization (EUA). This EUA will remain  in effect (meaning this test can be used) for the duration of the COVID-19 declaration under Section 564(b)(1) of the Act, 21 U.S.C.section 360bbb-3(b)(1), unless the authorization is terminated  or revoked sooner.       Influenza A by PCR NEGATIVE NEGATIVE Final   Influenza B by PCR NEGATIVE NEGATIVE Final    Comment: (NOTE) The Xpert Xpress SARS-CoV-2/FLU/RSV plus assay is intended as an aid in the diagnosis of influenza from Nasopharyngeal  swab specimens and should not be used as a sole basis for treatment. Nasal washings and aspirates are unacceptable for Xpert Xpress SARS-CoV-2/FLU/RSV testing.  Fact Sheet for Patients: EntrepreneurPulse.com.au  Fact Sheet for Healthcare Providers: IncredibleEmployment.be  This test is not yet approved or cleared by the [place] FDA and has been authorized for detection and/or diagnosis of SARS-CoV-2 by FDA under an Emergency Use Authorization (EUA). This EUA will remain in effect (meaning this test  can be used) for the duration of the COVID-19 declaration under Section 564(b)(1) of the Act, 21 U.S.C. section 360bbb-3(b)(1), unless the authorization is terminated or revoked.  Performed at [facility], [address] [place], [place] [address]     IMAGING RESULTS: CXR- no infiltrate I have personally reviewed the films ? Impression/Recommendation ? ?Acinetobacter bacteremia- likely due to wounds on the left leg. Agree with IV unasyn  Co2 narcosis causing metabolic encephalopathy OSA/Obesity hypoventilation syndrome leading to corpulmonale  COPD on solumedrol  Anemia  Fall with ecchymosis and bruising left leg  Paroxysmal Afib   ? ___________________________________________________ Discussed with her daughter and  requesting provider Note:  This document was prepared using Dragon voice recognition software and may include unintentional dictation errors.

## 2021-05-30 NOTE — Progress Notes (Addendum)
PROGRESS NOTE    Paula Torres   CXK:[id number]  DOB: 11-29-1950  PCP: Rusty Aus, MD    DOA: 05/28/2021 LOS: 2    Brief Narrative / Hospital Course to Date:   70 year old female with past medical history of diastolic CHF, hyperlipidemia, COPD chronically on 3 L/min supplemental oxygen, GERD, depression, paroxysmal A. fib on Eliquis, pulmonary hypertension, OSA noncompliant with BiPAP (supposed to use BiPAP but does not tolerate), iron deficiency anemia, CKD stage IIIa who presented to the ED on 05/28/2021 with progressive leg pain with bleeding and oozing after a mechanical fall 3 days prior.  Evaluation in the ED revealed severe sepsis secondary to left lower extremity wound infection and cellulitis.  Admitted to [facility] service and started on empiric IV antibiotics pending cultures.  Patient also with signs of acute COPD exacerbation started on IV steroids and bronchodilators.  Assessment & Plan   Principal Problem:   Wound infection_left lower leg Active Problems:   Acute on chronic respiratory failure with hypoxia (HCC)   AF (paroxysmal atrial fibrillation) (HCC)   Hyperlipidemia   COPD exacerbation (HCC)   Pulmonary hypertension (HCC)   Depression   Severe sepsis (HCC)   Chronic diastolic CHF (congestive heart failure) (HCC)   Acute renal failure superimposed on stage 3a chronic kidney disease (HCC)   Hyponatremia   Hypokalemia   Iron deficiency anemia   Cellulitis of left lower leg   Severe sepsis secondary to left lower extremity cellulitis and wound infection and secondary bacteremia -patient met criteria for severe sepsis on admission with leukocytosis, tachypnea, lactic acidosis consistent with organ dysfunction.  Treated per sepsis protocol in the ED. Blood cultures growing acinetobacter baumannii LLE doppler U/S negative for DVT --ID consulted --Continue Unasyn --Follow blood cultures --Wound care consult -see their wound care instructions --Off IV  fluids --Supportive care with antiemetics and analgesics  Acute on chronic respiratory failure with hypoxia secondary to COPD with acute exacerbation -patient required 3 L/min supplemental oxygen at baseline.  Has advanced COPD and BiPAP recommended but patient states does not tolerate. 9/27 - pt more lethargic, ABG with pCO2 80 (up from 50).   Started on BiPAP (requires mittens to keep it on), transfer to stepdown. PCCM consulted. --Continue IV steroids --Continue bronchodilators --On antibiotics as above --Follow sputum culture --Insert spirometer --Mucinex for cough -Supplement oxygen to maintain sats above 88%, wean as tolerated --BiPAP as needed  Hyponatremia -present with sodium 129, likely due to poor p.o. intake, dehydration.  Hold Lasix, spironolactone, metolazone.  Started on salt tablets on admission, continue for now.  Monitor BMP.  Further work-up if not improving  Paroxysmal A. Fib -rate controlled.  Eliquis on hold due to wound bleeding.  Continue bisoprolol.  Monitor on telemetry.  Monitor and replace potassium and magnesium as needed  Chronic diastolic CHF -prior echo on 01/21/2020 with EF 60 to 65%.  Patient volume status difficult to assess on exam due to morbid obesity with chest x-ray was without any pulmonary edema.  Appears overall compensated. --Lasix held on admission due to worsened renal function and hyponatremia --Monitor volume status and resume diuretic when appropriate --Daily weights  AKI superimposed on CKD stage IIIa -creatinine appears 1.0-1.2.  Presented with creatinine 2.11 and BUN 64, likely due to dehydration with severe sepsis in addition to being on Lasix.  She was treated with IV fluids in the ED. -- Hold Lasix --Toxins and hypotension --Monitor BMP daily  Pulmonary hypertension -continue treprostinil  Depression -continue Lexapro  Hyperlipidemia -  continue Lipitor  Acute on chronic iron deficiency anemia -hemoglobin in August was 10.3,  presented with hemoglobin 7.9.  Suspect blood loss with wound bleeding.  Patient agreeable to transfusion if needed 9/27 Hbg 6.8 --Transfuse 1 unit pRBC's today --Hold Eliquis --Monitor hemoglobin and transfuse if less than 7.0  OSA -does not tolerate BiPAP at home, required it on admission but is off BiPAP today.  Continue BiPAP as needed and supplemental oxygen.  Morbid obesity: Body mass index is 48.62 kg/m.  Complicates overall care and prognosis.  Recommend lifestyle modifications including physical activity and diet for weight loss and overall long-term health.   DVT prophylaxis: SCDs Start: 05/28/21 2024   Diet:  Diet Orders (From admission, onward)     Start     Ordered   05/28/21 1156  Diet regular Room service appropriate? Yes; Fluid consistency: Thin  Diet effective now       Question Answer Comment  Room service appropriate? Yes   Fluid consistency: Thin      05/28/21 1155              Code Status: Full Code   Subjective 05/30/21    Patient seen sitting up in bed this AM.  She reports feeling about the same.  She is very short of breath, pursed lip breathing while at rest.  Says she was hallucinating overnight, seeing family members.  Says this has happened in hospital before.  Denies other acute complaints.  Confirmed she itches a lot, causing all the scabs on her extremities.   Disposition Plan & Communication   Status is: Inpatient  Remains inpatient appropriate because:IV treatments appropriate due to intensity of illness or inability to take PO  Dispo: The patient is from: Home              Anticipated d/c is to: Home versus SNF              Patient currently is not medically stable to d/c.   Difficult to place patient No   Family Communication: None at bedside on rounds, will attempt call   Consults, Procedures, Significant Events   Consultants:  None  Procedures:  None  Antimicrobials:  Anti-infectives (From admission, onward)     Start     Dose/Rate Route Frequency Ordered Stop   05/29/21 1100  cefTRIAXone (ROCEPHIN) 2 g in sodium chloride 0.9 % 100 mL IVPB  Status:  Discontinued        2 g 200 mL/hr over 30 Minutes Intravenous Every 24 hours 05/28/21 1244 05/29/21 0248   05/29/21 1000  Ampicillin-Sulbactam (UNASYN) 3 g in sodium chloride 0.9 % 100 mL IVPB        3 g 200 mL/hr over 30 Minutes Intravenous Every 6 hours 05/29/21 0825     05/29/21 0400  Ampicillin-Sulbactam (UNASYN) 3 g in sodium chloride 0.9 % 100 mL IVPB  Status:  Discontinued        3 g 200 mL/hr over 30 Minutes Intravenous Every 12 hours 05/29/21 0251 05/29/21 0353   05/29/21 0400  Ampicillin-Sulbactam (UNASYN) 3 g in sodium chloride 0.9 % 100 mL IVPB  Status:  Discontinued        3 g 200 mL/hr over 30 Minutes Intravenous Every 8 hours 05/29/21 0353 05/29/21 0825   05/28/21 1309  vancomycin variable dose per unstable renal function (pharmacist dosing)  Status:  Discontinued         Does not apply See admin instructions 05/28/21 1309 05/29/21 0258  05/28/21 1200  vancomycin (VANCOREADY) IVPB 1500 mg/300 mL        1,500 mg 150 mL/hr over 120 Minutes Intravenous  Once 05/28/21 1102 05/28/21 1657   05/28/21 1100  vancomycin (VANCOCIN) IVPB 1000 mg/200 mL premix        1,000 mg 200 mL/hr over 60 Minutes Intravenous  Once 05/28/21 1056 05/28/21 1554   05/28/21 1100  cefTRIAXone (ROCEPHIN) 2 g in sodium chloride 0.9 % 100 mL IVPB        2 g 200 mL/hr over 30 Minutes Intravenous  Once 05/28/21 1056 05/28/21 1553         Micro    Objective   Vitals:   05/30/21 1053 05/30/21 1153 05/30/21 1210 05/30/21 1515  BP:  (!) 124/56 112/68 132/61  Pulse:  73 71 62  Resp:  _0 Temp:  98.7 F (37.1 C) 97.6 F (36.4 C) 97.9 F (36.6 C)  TempSrc:      SpO2: 96% 91% 96% 100%  Weight:      Height:        Intake/Output Summary (Last 24 hours) at 05/30/2021 1543 Last data filed at 05/30/2021 1515 Gross per 24 hour  Intake 980 ml  Output 1000  ml  Net -20 ml   Filed Weights   05/28/21 0916 05/30/21 0517  Weight: 136.1 kg 132.5 kg    Physical Exam:  General exam: awake, alert, no acute distress, morbidly obese Respiratory system: pursed lip breathing at rest, conversational dyspnea, lungs diminished due to body habitus and poor air movement, no wheezes appreciated Cardiovascular system: normal S1/S2, RRR, unable to assess JVD due to body habitus.   Central nervous system: A&O x3. no gross focal neurologic deficits, normal speech Extremities: scattered scabs on all extremities from scratching, Left lower extremity in knee appear stable, eschar on L knee Psychiatry: normal mood, flat affect  Labs   Data Reviewed: I have personally reviewed following labs and imaging studies  CBC: Recent Labs  Lab 05/28/21 0922 05/28/21 1246 05/29/21 0658 05/29/21 1939 05/30/21 0501  WBC 28.2*  --  28.3*  --  23.7*  NEUTROABS 24.5*  --   --   --   --   HGB 7.9* 7.3* 7.2* 7.2* 6.8*  HCT 24.2* 23.3* 22.4* 22.4* 21.6*  MCV 82.9  --  82.1  --  86.1  PLT 399  --  386  --  198   Basic Metabolic Panel: Recent Labs  Lab 05/28/21 0922 05/29/21 0658 05/30/21 0501  NA 129* 130* 135  K 3.1* 3.3* 3.5  CL 73* 77* 82*  CO2 39* 38* 38*  GLUCOSE 135* 181* 186*  BUN 64* 57* 59*  CREATININE 2.11* 1.60* 1.26*  CALCIUM 9.1 9.0 9.0  MG 2.3  --  2.8*   GFR: Estimated Creatinine Clearance: 58 mL/min (A) (by C-G formula based on SCr of 1.26 mg/dL (H)). Liver Function Tests: Recent Labs  Lab 05/28/21 0922  AST 15  ALT 8  ALKPHOS 101  BILITOT 1.2  PROT 6.6  ALBUMIN 3.2*   No results for input(s): LIPASE, AMYLASE in the last 168 hours. No results for input(s): AMMONIA in the last 168 hours. Coagulation Profile: No results for input(s): INR, PROTIME in the last 168 hours. Cardiac Enzymes: No results for input(s): CKTOTAL, CKMB, CKMBINDEX, TROPONINI in the last 168 hours. BNP (last 3 results) No results for input(s): PROBNP in the last  8760 hours. HbA1C: No results for input(s): HGBA1C in the last 72  hours. CBG: No results for input(s): GLUCAP in the last 168 hours. Lipid Profile: No results for input(s): CHOL, HDL, LDLCALC, TRIG, CHOLHDL, LDLDIRECT in the last 72 hours. Thyroid Function Tests: No results for input(s): TSH, T4TOTAL, FREET4, T3FREE, THYROIDAB in the last 72 hours. Anemia Panel: Recent Labs    05/30/21 0501  VITAMINB12 343  FOLATE 5.3*  FERRITIN 78  TIBC 403  IRON 26*  RETICCTPCT 5.2*   Sepsis Labs: Recent Labs  Lab 05/28/21 0922 05/28/21 1056  PROCALCITON 0.36  --   LATICACIDVEN 2.5* 1.7    Recent Results (from the past 240 hour(s))  Blood culture (routine x 2)     Status: Abnormal (Preliminary result)   Collection Time: 05/28/21 10:56 AM   Specimen: BLOOD  Result Value Ref Range Status   Specimen Description   Final    BLOOD RIGHT ANTECUBITAL Performed at [facility], [address], [place], [place] [address]    Special Requests   Final    BOTTLES DRAWN AEROBIC AND ANAEROBIC Blood Culture adequate volume Performed at [facility], [address] [place], [place] [address]    Culture  Setup Time   Final    GRAM NEGATIVE COCCOBACILLI AEROBIC BOTTLE ONLY CRITICAL VALUE NOTED.  VALUE IS CONSISTENT WITH PREVIOUSLY REPORTED AND CALLED VALUE. Performed at [facility], [place] [address] [place], [place] [address]    Culture ACINETOBACTER BAUMANNII (A)  Final   Report Status PENDING  Incomplete  Blood culture (routine x 2)     Status: Abnormal (Preliminary result)   Collection Time: 05/28/21 10:57 AM   Specimen: BLOOD  Result Value Ref Range Status   Specimen Description   Final    BLOOD BLOOD LEFT FOREARM Performed at [facility], [address], [place], [place] [address]    Special Requests   Final    BOTTLES DRAWN AEROBIC AND ANAEROBIC Blood Culture results may not be optimal due to an inadequate volume of blood received in culture  bottles Performed at [facility], [address] [place], [place] [address]    Culture  Setup Time   Final    Organism ID to follow GRAM NEGATIVE COCCOBACILLI IN BOTH AEROBIC AND ANAEROBIC BOTTLES CRITICAL RESULT CALLED TO, READ BACK BY AND VERIFIED WITH: AUSTIN REEVES @ 2327 05/28/21 LFD Performed at [facility], [address] [place], [place] [address]    Culture ACINETOBACTER BAUMANNII (A)  Final   Report Status PENDING  Incomplete  Blood Culture ID Panel (Reflexed)     Status: Abnormal (Preliminary result)   Collection Time: 05/28/21 10:57 AM  Result Value Ref Range Status   Enterococcus faecalis NOT DETECTED NOT DETECTED Final   Enterococcus Faecium NOT DETECTED NOT DETECTED Final   Listeria monocytogenes NOT DETECTED NOT DETECTED Final   Staphylococcus species NOT DETECTED NOT DETECTED Final   Staphylococcus aureus (BCID) PENDING NOT DETECTED Incomplete   Staphylococcus epidermidis NOT DETECTED NOT DETECTED Final   Staphylococcus lugdunensis NOT DETECTED NOT DETECTED Final   Streptococcus species NOT DETECTED NOT DETECTED Final   Streptococcus agalactiae NOT DETECTED NOT DETECTED Final   Streptococcus pneumoniae NOT DETECTED NOT DETECTED Final   Streptococcus pyogenes NOT DETECTED NOT DETECTED Final   A.calcoaceticus-baumannii DETECTED (A) NOT DETECTED Final    Comment: CRITICAL RESULT CALLED TO, READ BACK BY AND VERIFIED WITH: AUSTIN REEVES @ 2327 05/28/21 LFD    Bacteroides fragilis NOT DETECTED NOT DETECTED Final   Enterobacterales NOT DETECTED NOT DETECTED Final  Enterobacter cloacae complex NOT DETECTED NOT DETECTED Final   Escherichia coli NOT DETECTED NOT DETECTED Final   Klebsiella aerogenes NOT DETECTED NOT DETECTED Final   Klebsiella oxytoca NOT DETECTED NOT DETECTED Final   Klebsiella pneumoniae NOT DETECTED NOT DETECTED Final   Proteus species NOT DETECTED NOT DETECTED Final   Salmonella species NOT DETECTED NOT DETECTED Final    Serratia marcescens NOT DETECTED NOT DETECTED Final   Haemophilus influenzae NOT DETECTED NOT DETECTED Final   Neisseria meningitidis NOT DETECTED NOT DETECTED Final   Pseudomonas aeruginosa NOT DETECTED NOT DETECTED Final   Stenotrophomonas maltophilia NOT DETECTED NOT DETECTED Final   Candida albicans NOT DETECTED NOT DETECTED Final   Candida auris NOT DETECTED NOT DETECTED Final   Candida glabrata NOT DETECTED NOT DETECTED Final   Candida krusei NOT DETECTED NOT DETECTED Final   Candida parapsilosis NOT DETECTED NOT DETECTED Final   Candida tropicalis NOT DETECTED NOT DETECTED Final   Cryptococcus neoformans/gattii NOT DETECTED NOT DETECTED Final   CTX-M ESBL NOT DETECTED NOT DETECTED Final   Carbapenem resistance IMP NOT DETECTED NOT DETECTED Final   Carbapenem resistance KPC NOT DETECTED NOT DETECTED Final   Carbapenem resistance NDM NOT DETECTED NOT DETECTED Final   Carbapenem resistance VIM NOT DETECTED NOT DETECTED Final    Comment: Performed at [facility], [address] [place], [place] [address]  Resp Panel by RT-PCR (Flu A&B, Covid) Nasopharyngeal Swab     Status: None   Collection Time: 05/28/21 12:46 PM   Specimen: Nasopharyngeal Swab; Nasopharyngeal(NP) swabs in vial transport medium  Result Value Ref Range Status   SARS Coronavirus 2 by RT PCR NEGATIVE NEGATIVE Final    Comment: (NOTE) SARS-CoV-2 target nucleic acids are NOT DETECTED.  The SARS-CoV-2 RNA is generally detectable in upper respiratory specimens during the acute phase of infection. The lowest concentration of SARS-CoV-2 viral copies this assay can detect is 138 copies/mL. A negative result does not preclude SARS-Cov-2 infection and should not be used as the sole basis for treatment or other patient management decisions. A negative result may occur with  improper specimen collection/handling, submission of specimen other than nasopharyngeal swab, presence of viral mutation(s) within the areas  targeted by this assay, and inadequate number of viral copies(<138 copies/mL). A negative result must be combined with clinical observations, patient history, and epidemiological information. The expected result is Negative.  Fact Sheet for Patients:  EntrepreneurPulse.com.au  Fact Sheet for Healthcare Providers:  IncredibleEmployment.be  This test is no t yet approved or cleared by the [place] FDA and  has been authorized for detection and/or diagnosis of SARS-CoV-2 by FDA under an Emergency Use Authorization (EUA). This EUA will remain  in effect (meaning this test can be used) for the duration of the COVID-19 declaration under Section 564(b)(1) of the Act, 21 U.S.C.section 360bbb-3(b)(1), unless the authorization is terminated  or revoked sooner.       Influenza A by PCR NEGATIVE NEGATIVE Final   Influenza B by PCR NEGATIVE NEGATIVE Final    Comment: (NOTE) The Xpert Xpress SARS-CoV-2/FLU/RSV plus assay is intended as an aid in the diagnosis of influenza from Nasopharyngeal swab specimens and should not be used as a sole basis for treatment. Nasal washings and aspirates are unacceptable for Xpert Xpress SARS-CoV-2/FLU/RSV testing.  Fact Sheet for Patients: EntrepreneurPulse.com.au  Fact Sheet for Healthcare Providers: IncredibleEmployment.be  This test is not yet approved or cleared by the [place] FDA and has been authorized for detection and/or diagnosis  of SARS-CoV-2 by FDA under an Emergency Use Authorization (EUA). This EUA will remain in effect (meaning this test can be used) for the duration of the COVID-19 declaration under Section 564(b)(1) of the Act, 21 U.S.C. section 360bbb-3(b)(1), unless the authorization is terminated or revoked.  Performed at [facility], [address] [place], [place] [address]       Imaging Studies   CT HEAD WO CONTRAST (5MM)  Result  Date: 05/28/2021 CLINICAL DATA:  Delirium. EXAM: CT HEAD WITHOUT CONTRAST TECHNIQUE: Contiguous axial images were obtained from the base of the skull through the vertex without intravenous contrast. COMPARISON:  CT head 04/25/2021 BRAIN: BRAIN Patchy and confluent areas of decreased attenuation are noted throughout the deep and periventricular white matter of the cerebral hemispheres bilaterally, compatible with chronic microvascular ischemic disease. No evidence of large-territorial acute infarction. No parenchymal hemorrhage. No mass lesion. No extra-axial collection. No mass effect or midline shift. No hydrocephalus. Basilar cisterns are patent. Vascular: No hyperdense vessel. Atherosclerotic calcifications are present within the cavernous internal carotid arteries. Skull: No acute fracture or focal lesion. Sinuses/Orbits: Paranasal sinuses and mastoid air cells are clear. The orbits are unremarkable. Other: None. IMPRESSION: No acute intracranial abnormality. Electronically Signed   By: Iven Finn M.D.   On: 05/28/2021 18:00     Medications   Scheduled Meds:  atorvastatin  20 mg Oral QHS   bisoprolol  5 mg Oral QPM   collagenase   Topical Daily   escitalopram  20 mg Oral Daily   famotidine  20 mg Oral QHS   ferrous gluconate  324 mg Oral Weekly   ipratropium-albuterol  3 mL Nebulization Q6H WA   loratadine  10 mg Oral QHS   methylPREDNISolone (SOLU-MEDROL) injection  60 mg Intravenous BID   mometasone-formoterol  2 puff Inhalation BID   And   umeclidinium bromide  1 puff Inhalation Daily   montelukast  10 mg Oral QHS   sodium chloride  2 g Oral BID WC   Continuous Infusions:  sodium chloride Stopped (05/28/21 1600)   ampicillin-sulbactam (UNASYN) IV 3 g (05/30/21 0844)       LOS: 2 days    Time spent: 40 minutes with > 50% spent at bedside and in coordination of care     Ezekiel Slocumb, DO [facility]  05/30/2021, 3:43 PM      If 7PM-7AM, please contact  night-coverage. How to contact the [facility] Attending or Consulting provider [place] or covering provider during after hours [place], for this patient?    Check the care team in [facility] and look for a) attending/consulting [facility] provider listed and b) the [facility] team listed Log into [email or web address] and use [place]'s universal password to access. If you do not have the password, please contact the hospital operator. Locate the [facility] provider you are looking for under [facility] and page to a number that you can be directly reached. If you still have difficulty reaching the provider, please page the [facility] (Director on Call) for the Hospitalists listed on amion for assistance.

## 2021-05-30 NOTE — Consult Note (Addendum)
NAME:  Paula Torres, MRN:  [id number], DOB:  1950-10-23, LOS: 2 ADMISSION DATE:  05/28/2021 CONSULTATION DATE:  05/30/2021 REFERRING MD:   CHIEF COMPLAINT: left leg wound with bleeding and pain  70 year old female presented to the ER 9/22 and again 9/25 s/p fall with left leg wound, pain, swelling suspected cellulitis and wound infection with 9/25 blood cultures + A. Baumannii treated with Unasyn. History of COPD, OSA, 3lpm via [place] home O2, and nocturnal BiPAP noncompliance with hx smoking quit 2 years ago. Developed ALOC 9/26 with improvement on BiPAP support.   History of Present Illness:  Ms Paula Torres is a 70 year old female with past medical history including HFpEF, COPD, OSA, chronic hypoxic and hypercapnic respiratory failure with home Trilogy and home oxygen 3 liters per minute via nasal cannula, pulmonary hypertension, CAD, HTN, paroxysmal atrial fibrillation on Eliquis, GERD, and CKD III with chronic anemia. She initially presented to the ER on 05/25/21 with chief complaint of left knee pain after falling while getting up to use the bathroom at home. Imaging of the left knee was negative for acute fracture. She was discharged home at that visit with recommendations to use ACE wrap and home walker device for mobility and follow up with her PCP and orthopedics. However, she returned to the ER on 05/28/21 due to worsening persistent leg wound with bleeding and pain with worsening weakness and lightheadedness which resulted in repeat falls PTA.   ER workup and findings included: multiple areas of skin excoriation over entire body with left anterior knee abrasion with ecchymosis and erythema down to the foot with multiple oozing areas. Labs included: Lactate 2.5; WBC 28.2, hemoglobin 7.9; sodium 129, potassium 3.1, chloride 73, serum bicarb 39, BUN 64, creatinine 2.11 up from baseline, anion gap 17, glucose 135; ESR 108; CRP 14.7; BNP 175.7; procalcitonin WNL. CXR showed mild cardiac enlargement,  linear opacity of LLB and stable prominent vascular markings.   Patient was admitted to the hospital service on 05/28/21 for treatment of left leg wound infection with cellulitis and was in ER hold bed until she was transferred to the medical floor last night 9/26. She is being treated with Unasyn and received one time doses of ceftriaxone and vancomycin in the ER. She is also on 3-4 liters oxygen per nasal cannula, scheduled Pulmicort, Dulera, Ellipta, Duonebs, Solumedrol 44m BID, and as needed albulterol. Per RN report, patient has been picking at linens and possibly hallucinating. She has been alert, oriented to self, location, and situation but not to date which seems to be patient's baseline. She has a reported history of noncompliance with nightly BiPAP at home and uses 3 liters oxygen regularly. She received one unit of packed red cells this morning due to hemoglobin 6.8, and fell asleep during the transfusion after which she was unable to arouse by RN staff. She was placed on BiPAP support. ABG was done which showed pH 7.38, pCO2 80 which is patient's baseline per past ABG results, p)2 56, bicarb 47.3. Patient's mental status improved on BiPAP support.   Patient is currently being seen resting in bed for PCCM consult, upset on BiPAP mask and with bilateral hand mitts in place. She wants the BiPAP off, which writer removed upon patient request with nasal cannula fitted into bilateral nares. Patient reports that she does not use her BiPAP at home but does use home oxygen. She complains of chronic neck pain and states that she takes a low-dose of Percocet for this. She  is aware that she is being treated for leg infection. She is repositioning herself in bed with some difficulty and RN requested to aid with repositioning.    Pertinent  Medical History   Past Medical History:  Diagnosis Date   (HFpEF) heart failure with preserved ejection fraction (HCC)    Chronic respiratory failure with hypoxia  (HCC)    COPD (chronic obstructive pulmonary disease) (HCC)    Coronary artery disease, non-occlusive    Hypertension    OSA (obstructive sleep apnea)    PAF (paroxysmal atrial fibrillation) ([place])    Pulmonary hypertension ([place])      Significant Hospital Events: Including procedures, antibiotic start and stop dates in addition to other pertinent events   05/28/21: Presented to ED with worsening left leg wound after a mechanical fall approx 1 week ago.  05/28/21: Admitted to hospital service for left leg cellulitis/wound infection and transferred to floor on 05/29/21 PM 05/30/21: RN reported decreased LOC after 1U pRBC transfusion and placed on BiPAP with subsequent improvement in mental status. ABG showing pH 7.38, stable CO2 retention pCO2 80  Micro Data:  9/25: Respiratory viral panel COVID and Influenza A/B screen negative 9/25: Blood cultures positive for Acinetobacter baumannii  Antimicrobials:  9/25 ceftriaxone >> 9/25 9/25 Vancomycin >> 9/25 9/26 Unasyn >>   Interim History / Subjective:  Patient seen resting in bed, alert and awake, upset on BiPAP support. Bilateral hand mitts in place. Wants mask and mitts off, which were removed during exam. Alert to self, location, situation. Complains of discomfort with bed positioning and neck pain which is chronic to patient. Reports she does not wear her BiPAP at night at home.   Objective   Blood pressure 132/61, pulse 62, temperature 97.9 F (36.6 C), resp. rate 20, height _0  (1.651 m), weight 132.5 kg, SpO2 100 %.     Intake/Output Summary (Last 24 hours) at 05/30/2021 1532 Last data filed at 05/30/2021 1515 Gross per 24 hour  Intake 980 ml  Output 1000 ml  Net -20 ml   Filed Weights   05/28/21 0916 05/30/21 0517  Weight: 136.1 kg 132.5 kg   REVIEW OF SYSTEMS Review of Systems  Constitutional: Negative.   HENT: Negative.    Respiratory:  Positive for shortness of breath. Negative for cough, hemoptysis and wheezing.    Cardiovascular:  Positive for orthopnea and leg swelling. Negative for chest pain and palpitations.  Gastrointestinal: Negative.   Musculoskeletal:  Positive for neck pain.  Skin: Negative.   Neurological: Negative.   Psychiatric/Behavioral: Negative.      PHYSICAL EXAMINATION: GENERAL: ill appearing, obese, +resp distress +BiPAP, removed EYES: Pupils equal, round, reactive to light.  No scleral icterus.  MOUTH: Dry mucosal membrane. +dentures, removed by patient NECK: Supple.  PULMONARY: diminished bilaterally to ascultation. No rhonchi, wheezing noted CARDIOVASCULAR: S1 and S2.  No murmurs  GASTROINTESTINAL: Soft, nontender, non-distended. Positive bowel sounds.  MUSCULOSKELETAL: No swelling, clubbing, or edema. 1+ bilateral pedal edema NEUROLOGIC: alert, oriented x3, awake, following commands, moving all 4 extremities SKIN: intact,warm,dry +dry excoriations noted all 4 extremities with chronic skin changes right LE. Left LE in ACE wrap  Labs/imaging that I havepersonally reviewed  (right click and "Reselect all SmartList Selections" daily)  Labs: blood cultures + A. Baumannii 9/25 9/25: CXR no acute cardiopulmonary disease 9/25: CT Head no acute intracranial abnormality 9/25: [place] left LE venous doppler: negative for DVT  Resolved Hospital Problem list     ASSESSMENT AND PLAN  70 year old  female presented to the ER 9/22 and again 9/25 s/p fall with left leg wound, pain, swelling suspected cellulitis and wound infection with 9/25 blood cultures + A. Baumannii treated with Unasyn. History of COPD, OSA, 3lpm via [place] home O2, and nocturnal BiPAP noncompliance with hx smoking quit 2 years ago. Developed ALOC 9/26 with improvement on BiPAP support.   Acute on Chronic Hypoxic and Hypercapnic Respiratory Failure COPD with chronic CO2 retention OSA - Oxygen supplementation as needed to maintain O2 sats > 88%    - Home O2 3lpm - BiPAP support prn and at night as tolerated - Continue  Bronchodilator Therapy:  - Scheduled Pulmicort, Dulera, Ellipta, Duonebs - Solumedrol 84m BID - decrease to 248mIV BID - As needed albulterol nebs - ABG as needed   Below as per hospitalist team:   Sepsis secondary to Left LE wound with cellulitis 9/25 blood culture showed Acinetobacter baumannii - Unasyn as per pharmacy, hospital service - follow up on cultures - Wound care  Chronic diastolic dysfunction -oxygen as needed -Lasix as tolerated - holding due to renal function, hyponatremia POA -follow up cardiac enzymes as indicated  Acute renal failure on Stage III CKD -Avoid nephrotoxic agents -Follow urine output, BMP -Ensure adequate renal perfusion, optimize oxygenation -Renal dose medications  Intake/Output Summary (Last 24 hours) at 05/30/2021 1532 Last data filed at 05/30/2021 1515 Gross per 24 hour  Intake 980 ml  Output 1000 ml  Net -20 ml   Hyponatremia Hypokalemia -follow labs as needed -replace as needed -pharmacy consultation and following  Iron deficiency anemia S/p 1U pRBC 9/27 - as per hospitalist team  [place]ulmonary hypertension ([place]Trepro[place]   AF (paroxysmal atrial fibrillation) ([place]- home ZeBeta - Holding Eliquis due to wound bleeding, anemia   Best practice (right click and "Reselect all SmartList Selections" daily)  Diet: NPO Pain/Anxiety/Delirium protocol (if indicated): No VAP protocol (if indicated): Not indicated DVT prophylaxis: SCD GI prophylaxis: H2B Glucose control:  SSI No Central venous access:  N/A Arterial line:  N/A Foley:  N/A Mobility:  bed rest  Code Status:  FULL Disposition: transfer to stepdown/ICU not appropriate. Remain on telemetry status  Labs   CBC: Recent Labs  Lab 05/28/21 0922 05/28/21 1246 05/29/21 0658 05/29/21 1939 05/30/21 0501  WBC 28.2*  --  28.3*  --  23.7*  NEUTROABS 24.5*  --   --   --   --   HGB 7.9* 7.3* 7.2* 7.2* 6.8*  HCT 24.2* 23.3* 22.4* 22.4* 21.6*  MCV 82.9  --  82.1  --   86.1  PLT 399  --  386  --  38540  Basic Metabolic Panel: Recent Labs  Lab 05/28/21 0922 05/29/21 0658 05/30/21 0501  NA 129* 130* 135  K 3.1* 3.3* 3.5  CL 73* 77* 82*  CO2 39* 38* 38*  GLUCOSE 135* 181* 186*  BUN 64* 57* 59*  CREATININE 2.11* 1.60* 1.26*  CALCIUM 9.1 9.0 9.0  MG 2.3  --  2.8*   GFR: Estimated Creatinine Clearance: 58 mL/min (A) (by C-G formula based on SCr of 1.26 mg/dL (H)). Recent Labs  Lab 05/28/21 0922 05/28/21 1056 05/29/21 0658 05/30/21 0501  PROCALCITON 0.36  --   --   --   WBC 28.2*  --  28.3* 23.7*  LATICACIDVEN 2.5* 1.7  --   --     Liver Function Tests: Recent Labs  Lab 05/28/21 0922  AST 15  ALT 8  ALKPHOS 101  BILITOT 1.2  PROT 6.6  ALBUMIN 3.2*   No results for input(s): LIPASE, AMYLASE in the last 168 hours. No results for input(s): AMMONIA in the last 168 hours.  ABG    Component Value Date/Time   PHART 7.38 05/30/2021 1435   PCO2ART 80 (HH) 05/30/2021 1435   PO2ART 56 (L) 05/30/2021 1435   HCO3 47.3 (H) 05/30/2021 1435   O2SAT 88.1 05/30/2021 1435     Coagulation Profile: No results for input(s): INR, PROTIME in the last 168 hours.  Cardiac Enzymes: No results for input(s): CKTOTAL, CKMB, CKMBINDEX, TROPONINI in the last 168 hours.  HbA1C: Hgb A1c MFr Bld  Date/Time Value Ref Range Status  01/19/2020 08:37 AM 5.7 (H) 4.8 - 5.6 % Final    Comment:    (NOTE)         Prediabetes: 5.7 - 6.4         Diabetes: >6.4         Glycemic control for adults with diabetes: <7.0   11/09/2019 09:14 AM 6.0 (H) 4.8 - 5.6 % Final    Comment:    (NOTE) Pre diabetes:          5.7%-6.4% Diabetes:              >6.4% Glycemic control for   <7.0% adults with diabetes     CBG: No results for input(s): GLUCAP in the last 168 hours.   Past Medical History:  She,  has a past medical history of (HFpEF) heart failure with preserved ejection fraction ([place]), Chronic respiratory failure with hypoxia ([place]), COPD (chronic  obstructive pulmonary disease) ([place]), Coronary artery disease, non-occlusive, Hypertension, OSA (obstructive sleep apnea), PAF (paroxysmal atrial fibrillation) ([place]), and Pulmonary hypertension ([place]).   Surgical History:   Past Surgical History:  Procedure Laterality Date   NO PAST SURGERIES     RIGHT/LEFT HEART CATH AND CORONARY ANGIOGRAPHY Left 12/21/2019   Procedure: RIGHT/LEFT HEART CATH AND CORONARY ANGIOGRAPHY;  Surgeon: Wellington Hampshire, MD;  Location: [facility];  Service: Cardiovascular;  Laterality: Left;     Social History:   reports that she quit smoking about 3 years ago. Her smoking use included cigarettes. She has never used smokeless tobacco. She reports that she does not currently use alcohol. She reports that she does not use drugs.   Family History:  Her family history includes Hypertension in an other family member.   Allergies Allergies  Allergen Reactions   Codeine     Back Headache   Sulfa Antibiotics     Reported to pt from mother who is no longer living -  Details unknown     Home Medications  Prior to Admission medications   Medication Sig Start Date End Date Taking? Authorizing Provider  albuterol (VENTOLIN HFA) 108 (90 Base) MCG/ACT inhaler Inhale 2 puffs into the lungs every 6 (six) hours as needed for wheezing or shortness of breath.   Yes [provider]  atorvastatin (LIPITOR) 20 MG tablet Take 1 tablet (20 mg total) by mouth daily at 6 PM. Patient taking differently: Take 20 mg by mouth at bedtime. 11/23/19  Yes Loel Dubonnet, NP  azithromycin (ZITHROMAX) 250 MG tablet Take 250 mg by mouth every Monday, Wednesday, and Friday.   Yes [provider]  bisoprolol (ZEBETA) 5 MG tablet Take 5 mg by mouth every evening.   Yes [provider]  docusate sodium (COLACE) 100 MG capsule Take 1 tablet once or twice daily as needed for constipation while taking  narcotic pain medicine 05/25/21  Yes Hinda Kehr, MD  ELIQUIS  5 MG TABS tablet TAKE 1 TABLET(5 MG) BY MOUTH TWICE DAILY 05/30/20  Yes Dunn, Areta Haber, PA-C  escitalopram (LEXAPRO) 20 MG tablet Take 20 mg by mouth daily.   Yes [provider]  famotidine (PEPCID) 20 MG tablet Take 20 mg by mouth at bedtime.   Yes [provider]  ferrous gluconate (FERGON) 324 MG tablet Take 324 mg by mouth once a week.   Yes [provider]  fluticasone (FLONASE) 50 MCG/ACT nasal spray Place 2 sprays into both nostrils daily as needed for allergies.   Yes [provider]  Fluticasone-Umeclidin-Vilant 100-62.5-25 MCG/INH AEPB Inhale 1 puff into the lungs daily.   Yes [provider]  furosemide (LASIX) 40 MG tablet Take 2 tablets (80 mg total) by mouth daily. 04/27/21  Yes Fritzi Mandes, MD  ipratropium-albuterol (DUONEB) 0.5-2.5 (3) MG/3ML SOLN Take 3 mLs by nebulization every 6 (six) hours as needed (for wheezing/shortness of breath).   Yes [provider]  loratadine (CLARITIN) 10 MG tablet Take 1 tablet (10 mg total) by mouth daily. Patient taking differently: Take 10 mg by mouth at bedtime. 05/13/20  Yes Nicole Kindred A, DO  metolazone (ZAROXOLYN) 2.5 MG tablet Take 1 tablet (2.5 mg total) by mouth daily. Patient taking differently: Take 2.5 mg by mouth every other day. 05/04/21 08/02/21 Yes Hackney, Tina A, FNP  montelukast (SINGULAIR) 10 MG tablet Take 10 mg by mouth at bedtime.   Yes [provider]  oxyCODONE-acetaminophen (PERCOCET) 5-325 MG tablet Take 2 tablets by mouth every 6 (six) hours as needed for severe pain. 05/25/21  Yes Hinda Kehr, MD  predniSONE (DELTASONE) 20 MG tablet Take 20 mg by mouth every other day.   Yes [provider]  spironolactone (ALDACTONE) 25 MG tablet Take 1 tablet (25 mg total) by mouth daily. Patient taking differently: Take 50 mg by mouth daily. 11/23/19  Yes Loel Dubonnet, NP  Vitamin D, Ergocalciferol, (DRISDOL) 1.25 MG (50000 UNIT) CAPS capsule Take 50,000 Units by  mouth every Saturday.   Yes [provider]  Treprostinil Diolamine ER 0.25 MG TBCR Take 0.25 mg by mouth in the morning and at bedtime.    [provider]        Corrin Parker, M.D.  [facility]  Medical Director [place] Director [facility] Cardio-Pulmonary Department

## 2021-05-30 NOTE — Progress Notes (Signed)
PT Cancellation Note  Patient Details Name: Paula Torres MRN: [id number] DOB: 11/08/1950   Cancelled Treatment:    Reason Eval/Treat Not Completed: Medical issues which prohibited therapy. Pt chart reviewed. Hgb currently 6.8 - pt to receive blood transfusion this afternoon. Hgb of 7.0 is the PT cutoff score for mobilizing patients according to the APTA practice guidelines. Will attempt PT treatment at later time/date.   Patrina Levering PT, DPT 05/30/21 3:46 PM [phone]

## 2021-05-31 DIAGNOSIS — T148XXA Other injury of unspecified body region, initial encounter: Secondary | ICD-10-CM | POA: Diagnosis not present

## 2021-05-31 DIAGNOSIS — Z7189 Other specified counseling: Secondary | ICD-10-CM | POA: Diagnosis not present

## 2021-05-31 DIAGNOSIS — L089 Local infection of the skin and subcutaneous tissue, unspecified: Secondary | ICD-10-CM | POA: Diagnosis not present

## 2021-05-31 DIAGNOSIS — I4819 Other persistent atrial fibrillation: Secondary | ICD-10-CM

## 2021-05-31 LAB — BASIC METABOLIC PANEL
Anion gap: 11 (ref 5–15)
BUN: 51 mg/dL — ABNORMAL HIGH (ref 8–23)
CO2: 40 mmol/L — ABNORMAL HIGH (ref 22–32)
Calcium: 9.2 mg/dL (ref 8.9–10.3)
Chloride: 85 mmol/L — ABNORMAL LOW (ref 98–111)
Creatinine, Ser: 1.19 mg/dL — ABNORMAL HIGH (ref 0.44–1.00)
GFR, Estimated: 49 mL/min — ABNORMAL LOW (ref >60)
Glucose, Bld: 245 mg/dL — ABNORMAL HIGH (ref 70–99)
Potassium: 3.6 mmol/L (ref 3.5–5.1)
Sodium: 136 mmol/L (ref 135–145)

## 2021-05-31 LAB — TYPE AND SCREEN
ABO/RH(D): A NEG
ABO/RH(D): A NEG
Antibody Screen: NEGATIVE
Antibody Screen: NEGATIVE
Unit division: 0
Unit division: 0

## 2021-05-31 LAB — BPAM RBC
Blood Product Expiration Date: 202210242359
Blood Product Expiration Date: 202210242359
ISSUE DATE / TIME: 202209271147
Unit Type and Rh: 600
Unit Type and Rh: 600

## 2021-05-31 LAB — CULTURE, BLOOD (ROUTINE X 2): Special Requests: ADEQUATE

## 2021-05-31 LAB — CBC
HCT: 26 % — ABNORMAL LOW (ref 36.0–46.0)
Hemoglobin: 8.1 g/dL — ABNORMAL LOW (ref 12.0–15.0)
MCH: 27.6 pg (ref 26.0–34.0)
MCHC: 31.2 g/dL (ref 30.0–36.0)
MCV: 88.7 fL (ref 80.0–100.0)
Platelets: 440 10*3/uL — ABNORMAL HIGH (ref 150–400)
RBC: 2.93 MIL/uL — ABNORMAL LOW (ref 3.87–5.11)
RDW: 16 % — ABNORMAL HIGH (ref 11.5–15.5)
WBC: 24 10*3/uL — ABNORMAL HIGH (ref 4.0–10.5)
nRBC: 0.3 % — ABNORMAL HIGH (ref 0.0–0.2)

## 2021-05-31 LAB — MAGNESIUM: Magnesium: 2.8 mg/dL — ABNORMAL HIGH (ref 1.7–2.4)

## 2021-05-31 NOTE — Consult Note (Signed)
Consultation Note Date: 05/31/2021   Patient Name: Paula Torres  DOB: 04/01/1951  MRN: [id number]  Age / Sex: 70 y.o., female  PCP: Rusty Aus, MD Referring Physician: Enzo Bi, MD  Reason for Consultation: Establishing goals of care  HPI/Patient Profile:  Paula Torres is a 70 y.o. female with medical history significant of dCHF, hyperlipidemia, COPD on 3 L oxygen, GERD, depression, PAF on Eliquis, pulmonary hypertension, OSA on BiPAP, CAD, iron deficiency anemia, CKD-3A, who presents with left leg wound with bleeding and pain.  Clinical Assessment and Goals of Care: Patient is sitting in bedside chair. Questions are answered specifically with no elaboration. She states she is married, and has children. She states her daughter and her boyfriend live with her and her husband, and help around the house. She states her husband is "sick" and endorses chronic illness, but does not say anything more. She states she watches tv during the day. Her daughter and her boyfriend mow the yard, cook, and clean, and do other chores. Thus far, she has not used assistive devices, but states prior to admission she was looking to start using a walker.   She states she has been on O2 for around 1 year, and it was initiated at 3 lpm at that time. She states she has a BIPAP, but does not like it because she feels it makes it harder to breathe-particularly to exhale. Intermittent pursed lip breathing noted; she states she feels her pulmonary status is at baseline, and she is followed by [facility] pulmonology.   We discussed her diagnosis, prognosis, GOC, EOL wishes disposition and options.  A detailed discussion was had today regarding advanced directives.  Concepts specific to code status, ventilator support, and rehospitalization were discussed.  The difference between an aggressive medical intervention path and a comfort  care path was discussed.  Values and goals of care important to patient and family were attempted to be elicited.  She states she has been told if she was ever placed on a ventilator, she would have great difficulty with "coming off", if she were able to wean. She states she chose to forgo surgery for a broken arm because she was afraid of not weaning from the ventilator.   She states if it were indicated, she would want to be placed on a ventilator. She states she would not want a tracheostomy. She states she would want CPR and shocks if indicated.      SUMMARY OF RECOMMENDATIONS   Full code/full scope.  Recommend outpatient palliative       Primary Diagnoses: Present on Admission:  Hyperlipidemia  AF (paroxysmal atrial fibrillation) (HCC)  Severe sepsis (HCC)  Wound infection_left lower leg  Depression  Chronic diastolic CHF (congestive heart failure) (HCC)  COPD exacerbation (HCC)  Acute renal failure superimposed on stage 3a chronic kidney disease (HCC)  Pulmonary hypertension (HCC)  Acute on chronic respiratory failure with hypoxia (HCC)  Hyponatremia  Hypokalemia  Iron deficiency anemia  Cellulitis of left lower leg   I have  reviewed the medical record, interviewed the patient and family, and examined the patient. The following aspects are pertinent.  Past Medical History:  Diagnosis Date   (HFpEF) heart failure with preserved ejection fraction (HCC)    Chronic respiratory failure with hypoxia (HCC)    COPD (chronic obstructive pulmonary disease) (HCC)    Coronary artery disease, non-occlusive    Hypertension    OSA (obstructive sleep apnea)    PAF (paroxysmal atrial fibrillation) (HCC)    Pulmonary hypertension (HCC)    Social History   Socioeconomic History   Marital status: Married    Spouse name: Not on file   Number of children: Not on file   Years of education: Not on file   Highest education level: Not on file  Occupational History   Not on file   Tobacco Use   Smoking status: Former    Types: Cigarettes    Quit date: 06/03/2017    Years since quitting: 3.9   Smokeless tobacco: Never  Vaping Use   Vaping Use: Never used  Substance and Sexual Activity   Alcohol use: Not Currently   Drug use: Never   Sexual activity: Not on file  Other Topics Concern   Not on file  Social History Narrative   Not on file   Social Determinants of Health   Financial Resource Strain: Not on file  Food Insecurity: Not on file  Transportation Needs: Not on file  Physical Activity: Not on file  Stress: Not on file  Social Connections: Not on file   Family History  Problem Relation Age of Onset   Hypertension Other    Scheduled Meds:  atorvastatin  20 mg Oral QHS   bisoprolol  5 mg Oral QPM   budesonide (PULMICORT) nebulizer solution  0.5 mg Nebulization BID   collagenase   Topical Daily   escitalopram  20 mg Oral Daily   famotidine  20 mg Oral QHS   ferrous gluconate  324 mg Oral Weekly   ipratropium-albuterol  3 mL Nebulization Q4H   loratadine  10 mg Oral QHS   methylPREDNISolone (SOLU-MEDROL) injection  60 mg Intravenous BID   mometasone-formoterol  2 puff Inhalation BID   And   umeclidinium bromide  1 puff Inhalation Daily   montelukast  10 mg Oral QHS   sodium chloride  2 g Oral BID WC   Continuous Infusions:  sodium chloride Stopped (05/28/21 1600)   ampicillin-sulbactam (UNASYN) IV 3 g (05/31/21 1315)   PRN Meds:.sodium chloride, acetaminophen, albuterol, dextromethorphan-guaiFENesin, docusate sodium, hydrALAZINE, LORazepam, ondansetron (ZOFRAN) IV, oxyCODONE-acetaminophen Medications Prior to Admission:  Prior to Admission medications   Medication Sig Start Date End Date Taking? Authorizing Provider  albuterol (VENTOLIN HFA) 108 (90 Base) MCG/ACT inhaler Inhale 2 puffs into the lungs every 6 (six) hours as needed for wheezing or shortness of breath.   Yes [provider]  atorvastatin (LIPITOR) 20 MG tablet Take  1 tablet (20 mg total) by mouth daily at 6 PM. Patient taking differently: Take 20 mg by mouth at bedtime. 11/23/19  Yes Loel Dubonnet, NP  azithromycin (ZITHROMAX) 250 MG tablet Take 250 mg by mouth every Monday, Wednesday, and Friday.   Yes [provider]  bisoprolol (ZEBETA) 5 MG tablet Take 5 mg by mouth every evening.   Yes [provider]  docusate sodium (COLACE) 100 MG capsule Take 1 tablet once or twice daily as needed for constipation while taking narcotic pain medicine 05/25/21  Yes Hinda Kehr, MD  ELIQUIS 5 MG TABS tablet TAKE 1 TABLET(5 MG) BY MOUTH TWICE DAILY 05/30/20  Yes Dunn, Ryan M, PA-C  escitalopram (LEXAPRO) 20 MG tablet Take 20 mg by mouth daily.   Yes [provider]  famotidine (PEPCID) 20 MG tablet Take 20 mg by mouth at bedtime.   Yes [provider]  ferrous gluconate (FERGON) 324 MG tablet Take 324 mg by mouth once a week.   Yes [provider]  fluticasone (FLONASE) 50 MCG/ACT nasal spray Place 2 sprays into both nostrils daily as needed for allergies.   Yes [provider]  Fluticasone-Umeclidin-Vilant 100-62.5-25 MCG/INH AEPB Inhale 1 puff into the lungs daily.   Yes [provider]  furosemide (LASIX) 40 MG tablet Take 2 tablets (80 mg total) by mouth daily. 04/27/21  Yes Fritzi Mandes, MD  ipratropium-albuterol (DUONEB) 0.5-2.5 (3) MG/3ML SOLN Take 3 mLs by nebulization every 6 (six) hours as needed (for wheezing/shortness of breath).   Yes [provider]  loratadine (CLARITIN) 10 MG tablet Take 1 tablet (10 mg total) by mouth daily. Patient taking differently: Take 10 mg by mouth at bedtime. 05/13/20  Yes Nicole Kindred A, DO  metolazone (ZAROXOLYN) 2.5 MG tablet Take 1 tablet (2.5 mg total) by mouth daily. Patient taking differently: Take 2.5 mg by mouth every other day. 05/04/21 08/02/21 Yes Hackney, Tina A, FNP  montelukast (SINGULAIR) 10 MG tablet Take 10 mg by mouth at bedtime.   Yes  [provider]  oxyCODONE-acetaminophen (PERCOCET) 5-325 MG tablet Take 2 tablets by mouth every 6 (six) hours as needed for severe pain. 05/25/21  Yes Hinda Kehr, MD  predniSONE (DELTASONE) 20 MG tablet Take 20 mg by mouth every other day.   Yes [provider]  spironolactone (ALDACTONE) 25 MG tablet Take 1 tablet (25 mg total) by mouth daily. Patient taking differently: Take 50 mg by mouth daily. 11/23/19  Yes Loel Dubonnet, NP  Vitamin D, Ergocalciferol, (DRISDOL) 1.25 MG (50000 UNIT) CAPS capsule Take 50,000 Units by mouth every Saturday.   Yes [provider]  Treprostinil Diolamine ER 0.25 MG TBCR Take 0.25 mg by mouth in the morning and at bedtime.    [provider]   Allergies  Allergen Reactions   Codeine     Back Headache   Sulfa Antibiotics     Reported to pt from mother who is no longer living -  Details unknown   Review of Systems  Respiratory:  Positive for shortness of breath.    Physical Exam Pulmonary:     Comments: Intermittent pursed lip breathing.  Neurological:     Mental Status: She is alert.    Vital Signs: BP 121/60 (BP Location: Left Arm)   Pulse 77   Temp 98.3 F (36.8 C) (Oral)   Resp 19   Ht _0  (1.651 m)   Wt 132.4 kg   SpO2 94%   BMI 48.57 kg/m  Pain Scale: 0-10   Pain Score: 0-No pain   SpO2: SpO2: 94 % O2 Device:SpO2: 94 % O2 Flow Rate: .O2 Flow Rate (L/min): 5 L/min  IO: Intake/output summary:  Intake/Output Summary (Last 24 hours) at 05/31/2021 1548 Last data filed at 05/31/2021 1105 Gross per 24 hour  Intake 456.25 ml  Output 1600 ml  Net -1143.75 ml    LBM: Last BM Date:  (PTA) Baseline Weight: Weight: 136.1 kg Most recent weight: Weight: 132.4 kg        Time In: 3:15 Time Out: 4:00  Time Total: 45 min Greater than 50%  of this time was spent counseling and coordinating care related to the above assessment and plan.  Signed by: Asencion Gowda, NP   Please contact  Palliative Medicine Team phone at [phone] for questions and concerns.  For individual provider: See Shea Evans

## 2021-05-31 NOTE — Progress Notes (Signed)
PROGRESS NOTE    Paula Torres   OEV:[id number]  DOB: 03/15/1951  PCP: Rusty Aus, MD    DOA: 05/28/2021 LOS: 3    Brief Narrative / Hospital Course to Date:   70 year old female with past medical history of diastolic CHF, hyperlipidemia, COPD chronically on 3 L/min supplemental oxygen, GERD, depression, paroxysmal A. fib on Eliquis, pulmonary hypertension, OSA noncompliant with BiPAP (supposed to use BiPAP but does not tolerate), iron deficiency anemia, CKD stage IIIa who presented to the ED on 05/28/2021 with progressive leg pain with bleeding and oozing after a mechanical fall 3 days prior.  Evaluation in the ED revealed severe sepsis secondary to left lower extremity wound infection and cellulitis.  Admitted to [facility] service and started on empiric IV antibiotics pending cultures.  Patient also with signs of acute COPD exacerbation started on IV steroids and bronchodilators.  Assessment & Plan   Principal Problem:   Wound infection_left lower leg Active Problems:   Hyperlipidemia   COPD exacerbation (HCC)   Pulmonary hypertension (HCC)   Depression   Acute on chronic respiratory failure with hypoxia (HCC)   AF (paroxysmal atrial fibrillation) (HCC)   Severe sepsis (HCC)   Chronic diastolic CHF (congestive heart failure) (HCC)   Acute renal failure superimposed on stage 3a chronic kidney disease (HCC)   Hyponatremia   Hypokalemia   Iron deficiency anemia   Cellulitis of left lower leg   Severe sepsis secondary to left lower extremity cellulitis and wound infection and secondary acinetobacter bacteremia  -patient met criteria for severe sepsis on admission with leukocytosis, tachypnea, lactic acidosis consistent with organ dysfunction.  Treated per sepsis protocol in the ED. Blood cultures growing acinetobacter baumannii LLE doppler U/S negative for DVT --ID consulted Plan: --cont Unasyn --wound care per order  Acute on chronic respiratory failure with hypoxia  secondary to COPD with acute exacerbation  -patient required 3 L/min supplemental oxygen at baseline.  Has advanced COPD and BiPAP recommended but patient states does not tolerate. 9/27 - pt more lethargic, ABG with pCO2 80 (up from 50).   Started on BiPAP (requires mittens to keep it on), transfer to stepdown. PCCM consulted. Plan: --cont IV solumedrol --Continue bronchodilators --Continue supplemental O2 to keep sats between 88-92%, wean as tolerated  Hyponatremia  -present with sodium 129, likely due to poor p.o. intake, dehydration.   --Hold Lasix, spironolactone, metolazone.   --cont salt tablet (new)  Paroxysmal A. Fib -rate controlled.   Continue bisoprolol.   --hold Eliquis due to wound bleeding  Chronic diastolic CHF -prior echo on 01/21/2020 with EF 60 to 65%.  Patient volume status difficult to assess on exam due to morbid obesity with chest x-ray was without any pulmonary edema.  Appears overall compensated. --Lasix held on admission due to worsened renal function and hyponatremia --Monitor volume status and resume diuretic when appropriate --Daily weights  AKI superimposed on CKD stage IIIa  -creatinine appears 1.0-1.2.  Presented with creatinine 2.11 and BUN 64, likely due to dehydration with severe sepsis in addition to being on Lasix.  She was treated with IV fluids in the ED. --Hold lasix --encourage oral hydration  Pulmonary hypertension -continue treprostinil  Depression  -continue Lexapro  Hyperlipidemia  -continue Lipitor  Acute on chronic iron deficiency anemia -hemoglobin in August was 10.3, presented with hemoglobin 7.9.  Suspect blood loss with wound bleeding.   --1u pRBC on 9/27 for Hbg 6.8 --Hold Eliquis --cont iron supplement --Monitor hemoglobin and transfuse if less than 7.0  OSA -  does not tolerate BiPAP at home Continue BiPAP as needed and supplemental oxygen.  Morbid obesity: Body mass index is 48.57 kg/m.  Complicates overall care and  prognosis.  Recommend lifestyle modifications including physical activity and diet for weight loss and overall long-term health.   DVT prophylaxis: SCD/Compression stockings Code Status: Full code  Family Communication:  Status is: inpatient Dispo:   The patient is from: home Anticipated d/c is to: SNF Anticipated d/c date is: undetermined  Patient currently is not medically stable to d/c due to: 5L O2, IV abx   Subjective 05/31/21    Some pain with her left knee when doing ROM.     Consults, Procedures, Significant Events   Consultants:  None  Procedures:  None  Antimicrobials:  Anti-infectives (From admission, onward)    Start     Dose/Rate Route Frequency Ordered Stop   05/29/21 1100  cefTRIAXone (ROCEPHIN) 2 g in sodium chloride 0.9 % 100 mL IVPB  Status:  Discontinued        2 g 200 mL/hr over 30 Minutes Intravenous Every 24 hours 05/28/21 1244 05/29/21 0248   05/29/21 1000  Ampicillin-Sulbactam (UNASYN) 3 g in sodium chloride 0.9 % 100 mL IVPB        3 g 200 mL/hr over 30 Minutes Intravenous Every 6 hours 05/29/21 0825     05/29/21 0400  Ampicillin-Sulbactam (UNASYN) 3 g in sodium chloride 0.9 % 100 mL IVPB  Status:  Discontinued        3 g 200 mL/hr over 30 Minutes Intravenous Every 12 hours 05/29/21 0251 05/29/21 0353   05/29/21 0400  Ampicillin-Sulbactam (UNASYN) 3 g in sodium chloride 0.9 % 100 mL IVPB  Status:  Discontinued        3 g 200 mL/hr over 30 Minutes Intravenous Every 8 hours 05/29/21 0353 05/29/21 0825   05/28/21 1309  vancomycin variable dose per unstable renal function (pharmacist dosing)  Status:  Discontinued         Does not apply See admin instructions 05/28/21 1309 05/29/21 0258   05/28/21 1200  vancomycin (VANCOREADY) IVPB 1500 mg/300 mL        1,500 mg 150 mL/hr over 120 Minutes Intravenous  Once 05/28/21 1102 05/28/21 1657   05/28/21 1100  vancomycin (VANCOCIN) IVPB 1000 mg/200 mL premix        1,000 mg 200 mL/hr over 60 Minutes  Intravenous  Once 05/28/21 1056 05/28/21 1554   05/28/21 1100  cefTRIAXone (ROCEPHIN) 2 g in sodium chloride 0.9 % 100 mL IVPB        2 g 200 mL/hr over 30 Minutes Intravenous  Once 05/28/21 1056 05/28/21 1553         Micro    Objective   Vitals:   05/31/21 1102 05/31/21 1105 05/31/21 1530 05/31/21 1911  BP:  121/60 128/70 (!) 159/70  Pulse:  77 80 74  Resp:  _0 Temp:  98.3 F (36.8 C) 98.6 F (37 C) 98.6 F (37 C)  TempSrc:  Oral Oral   SpO2: (!) 66% 93% 94% 95%  Weight:      Height:        Intake/Output Summary (Last 24 hours) at 05/31/2021 1942 Last data filed at 05/31/2021 1748 Gross per 24 hour  Intake 1520 ml  Output 2400 ml  Net -880 ml   Filed Weights   05/28/21 0916 05/30/21 0517 05/31/21 0500  Weight: 136.1 kg 132.5 kg 132.4 kg    Physical Exam:  Constitutional: NAD, AAOx3, sitting in recliner HEENT: conjunctivae and lids normal, EOMI CV: No cyanosis.   RESP: normal respiratory effort, on RA Extremities: Left knee more swollen, with large area of eschar SKIN: warm, dry, many scabs and scratches present Neuro: II - XII grossly intact.   Psych: Normal mood and affect.  Appropriate judgement and reason   Labs   Data Reviewed: I have personally reviewed following labs and imaging studies  CBC: Recent Labs  Lab 05/28/21 0922 05/28/21 1246 05/29/21 0658 05/29/21 1939 05/30/21 0501 05/30/21 1548 05/31/21 0538  WBC 28.2*  --  28.3*  --  23.7*  --  24.0*  NEUTROABS 24.5*  --   --   --   --   --   --   HGB 7.9*   < > 7.2* 7.2* 6.8* 8.3* 8.1*  HCT 24.2*   < > 22.4* 22.4* 21.6* 25.9* 26.0*  MCV 82.9  --  82.1  --  86.1  --  88.7  PLT 399  --  386  --  382  --  440*   < > = values in this interval not displayed.   Basic Metabolic Panel: Recent Labs  Lab 05/28/21 0922 05/29/21 0658 05/30/21 0501 05/31/21 0538  NA 129* 130* 135 136  K 3.1* 3.3* 3.5 3.6  CL 73* 77* 82* 85*  CO2 39* 38* 38* 40*  GLUCOSE 135* 181* 186* 245*  BUN 64*  57* 59* 51*  CREATININE 2.11* 1.60* 1.26* 1.19*  CALCIUM 9.1 9.0 9.0 9.2  MG 2.3  --  2.8* 2.8*   GFR: Estimated Creatinine Clearance: 61.4 mL/min (A) (by C-G formula based on SCr of 1.19 mg/dL (H)). Liver Function Tests: Recent Labs  Lab 05/28/21 0922  AST 15  ALT 8  ALKPHOS 101  BILITOT 1.2  PROT 6.6  ALBUMIN 3.2*   No results for input(s): LIPASE, AMYLASE in the last 168 hours. No results for input(s): AMMONIA in the last 168 hours. Coagulation Profile: No results for input(s): INR, PROTIME in the last 168 hours. Cardiac Enzymes: No results for input(s): CKTOTAL, CKMB, CKMBINDEX, TROPONINI in the last 168 hours. BNP (last 3 results) No results for input(s): PROBNP in the last 8760 hours. HbA1C: No results for input(s): HGBA1C in the last 72 hours. CBG: No results for input(s): GLUCAP in the last 168 hours. Lipid Profile: No results for input(s): CHOL, HDL, LDLCALC, TRIG, CHOLHDL, LDLDIRECT in the last 72 hours. Thyroid Function Tests: No results for input(s): TSH, T4TOTAL, FREET4, T3FREE, THYROIDAB in the last 72 hours. Anemia Panel: Recent Labs    05/30/21 0501  VITAMINB12 343  FOLATE 5.3*  FERRITIN 78  TIBC 403  IRON 26*  RETICCTPCT 5.2*   Sepsis Labs: Recent Labs  Lab 05/28/21 0922 05/28/21 1056  PROCALCITON 0.36  --   LATICACIDVEN 2.5* 1.7    Recent Results (from the past 240 hour(s))  Blood culture (routine x 2)     Status: Abnormal   Collection Time: 05/28/21 10:56 AM   Specimen: BLOOD  Result Value Ref Range Status   Specimen Description   Final    BLOOD RIGHT ANTECUBITAL Performed at [facility], [address], [place], [place] [address]    Special Requests   Final    BOTTLES DRAWN AEROBIC AND ANAEROBIC Blood Culture adequate volume Performed at [facility], [address] [place], [place] [address]    Culture  Setup Time   Final    GRAM NEGATIVE COCCOBACILLI AEROBIC BOTTLE  ONLY CRITICAL VALUE NOTED.  VALUE IS  CONSISTENT WITH PREVIOUSLY REPORTED AND CALLED VALUE.    Culture (A)  Final    ACINETOBACTER CALCOACETICUS/BAUMANNII COMPLEX SUSCEPTIBILITIES PERFORMED ON PREVIOUS CULTURE WITHIN THE LAST 5 DAYS. Performed at [facility], [place] [address], [place], [place] [address]    Report Status 05/31/2021 FINAL  Final  Blood culture (routine x 2)     Status: Abnormal   Collection Time: 05/28/21 10:57 AM   Specimen: BLOOD  Result Value Ref Range Status   Specimen Description   Final    BLOOD BLOOD LEFT FOREARM Performed at [facility], [address] [place], [place] [address]    Special Requests   Final    BOTTLES DRAWN AEROBIC AND ANAEROBIC Blood Culture results may not be optimal due to an inadequate volume of blood received in culture bottles Performed at [facility], [address] [place], [place] [address]    Culture  Setup Time   Final    Organism ID to follow GRAM NEGATIVE COCCOBACILLI IN BOTH AEROBIC AND ANAEROBIC BOTTLES CRITICAL RESULT CALLED TO, READ BACK BY AND VERIFIED WITH: AUSTIN REEVES @ 2327 05/28/21 LFD Performed at [facility], [address] [place], [place] [address]    Culture ACINETOBACTER CALCOACETICUS/BAUMANNII COMPLEX (A)  Final   Report Status 05/31/2021 FINAL  Final   Organism ID, Bacteria ACINETOBACTER CALCOACETICUS/BAUMANNII COMPLEX  Final      Susceptibility   Acinetobacter calcoaceticus/baumannii complex - MIC*    CEFTAZIDIME 4 SENSITIVE Sensitive     CIPROFLOXACIN >=4 RESISTANT Resistant     GENTAMICIN 2 SENSITIVE Sensitive     IMIPENEM <=0.25 SENSITIVE Sensitive     PIP/TAZO <=4 SENSITIVE Sensitive     TRIMETH/SULFA <=20 SENSITIVE Sensitive     AMPICILLIN/SULBACTAM <=2 SENSITIVE Sensitive     * ACINETOBACTER CALCOACETICUS/BAUMANNII COMPLEX  Blood Culture ID Panel (Reflexed)     Status: Abnormal (Preliminary result)   Collection Time: 05/28/21 10:57 AM  Result Value Ref Range Status   Enterococcus faecalis NOT  DETECTED NOT DETECTED Final   Enterococcus Faecium NOT DETECTED NOT DETECTED Final   Listeria monocytogenes NOT DETECTED NOT DETECTED Final   Staphylococcus species NOT DETECTED NOT DETECTED Final   Staphylococcus aureus (BCID) PENDING NOT DETECTED Incomplete   Staphylococcus epidermidis NOT DETECTED NOT DETECTED Final   Staphylococcus lugdunensis NOT DETECTED NOT DETECTED Final   Streptococcus species NOT DETECTED NOT DETECTED Final   Streptococcus agalactiae NOT DETECTED NOT DETECTED Final   Streptococcus pneumoniae NOT DETECTED NOT DETECTED Final   Streptococcus pyogenes NOT DETECTED NOT DETECTED Final   A.calcoaceticus-baumannii DETECTED (A) NOT DETECTED Final    Comment: CRITICAL RESULT CALLED TO, READ BACK BY AND VERIFIED WITH: AUSTIN REEVES @ 2327 05/28/21 LFD    Bacteroides fragilis NOT DETECTED NOT DETECTED Final   Enterobacterales NOT DETECTED NOT DETECTED Final   Enterobacter cloacae complex NOT DETECTED NOT DETECTED Final   Escherichia coli NOT DETECTED NOT DETECTED Final   Klebsiella aerogenes NOT DETECTED NOT DETECTED Final   Klebsiella oxytoca NOT DETECTED NOT DETECTED Final   Klebsiella pneumoniae NOT DETECTED NOT DETECTED Final   Proteus species NOT DETECTED NOT DETECTED Final   Salmonella species NOT DETECTED NOT DETECTED Final   Serratia marcescens NOT DETECTED NOT DETECTED Final   Haemophilus influenzae NOT DETECTED NOT DETECTED Final   Neisseria meningitidis NOT DETECTED NOT DETECTED Final   Pseudomonas aeruginosa NOT DETECTED NOT DETECTED Final   Stenotrophomonas maltophilia NOT DETECTED NOT DETECTED  Final   Candida albicans NOT DETECTED NOT DETECTED Final   Candida auris NOT DETECTED NOT DETECTED Final   Candida glabrata NOT DETECTED NOT DETECTED Final   Candida krusei NOT DETECTED NOT DETECTED Final   Candida parapsilosis NOT DETECTED NOT DETECTED Final   Candida tropicalis NOT DETECTED NOT DETECTED Final   Cryptococcus neoformans/gattii NOT DETECTED NOT  DETECTED Final   CTX-M ESBL NOT DETECTED NOT DETECTED Final   Carbapenem resistance IMP NOT DETECTED NOT DETECTED Final   Carbapenem resistance KPC NOT DETECTED NOT DETECTED Final   Carbapenem resistance NDM NOT DETECTED NOT DETECTED Final   Carbapenem resistance VIM NOT DETECTED NOT DETECTED Final    Comment: Performed at [facility], [address] [place], [place] [address]  Resp Panel by RT-PCR (Flu A&B, Covid) Nasopharyngeal Swab     Status: None   Collection Time: 05/28/21 12:46 PM   Specimen: Nasopharyngeal Swab; Nasopharyngeal(NP) swabs in vial transport medium  Result Value Ref Range Status   SARS Coronavirus 2 by RT PCR NEGATIVE NEGATIVE Final    Comment: (NOTE) SARS-CoV-2 target nucleic acids are NOT DETECTED.  The SARS-CoV-2 RNA is generally detectable in upper respiratory specimens during the acute phase of infection. The lowest concentration of SARS-CoV-2 viral copies this assay can detect is 138 copies/mL. A negative result does not preclude SARS-Cov-2 infection and should not be used as the sole basis for treatment or other patient management decisions. A negative result may occur with  improper specimen collection/handling, submission of specimen other than nasopharyngeal swab, presence of viral mutation(s) within the areas targeted by this assay, and inadequate number of viral copies(<138 copies/mL). A negative result must be combined with clinical observations, patient history, and epidemiological information. The expected result is Negative.  Fact Sheet for Patients:  EntrepreneurPulse.com.au  Fact Sheet for Healthcare Providers:  IncredibleEmployment.be  This test is no t yet approved or cleared by the [place] FDA and  has been authorized for detection and/or diagnosis of SARS-CoV-2 by FDA under an Emergency Use Authorization (EUA). This EUA will remain  in effect (meaning this test can be used) for the  duration of the COVID-19 declaration under Section 564(b)(1) of the Act, 21 U.S.C.section 360bbb-3(b)(1), unless the authorization is terminated  or revoked sooner.       Influenza A by PCR NEGATIVE NEGATIVE Final   Influenza B by PCR NEGATIVE NEGATIVE Final    Comment: (NOTE) The Xpert Xpress SARS-CoV-2/FLU/RSV plus assay is intended as an aid in the diagnosis of influenza from Nasopharyngeal swab specimens and should not be used as a sole basis for treatment. Nasal washings and aspirates are unacceptable for Xpert Xpress SARS-CoV-2/FLU/RSV testing.  Fact Sheet for Patients: EntrepreneurPulse.com.au  Fact Sheet for Healthcare Providers: IncredibleEmployment.be  This test is not yet approved or cleared by the [place] FDA and has been authorized for detection and/or diagnosis of SARS-CoV-2 by FDA under an Emergency Use Authorization (EUA). This EUA will remain in effect (meaning this test can be used) for the duration of the COVID-19 declaration under Section 564(b)(1) of the Act, 21 U.S.C. section 360bbb-3(b)(1), unless the authorization is terminated or revoked.  Performed at [facility], [address], [place], [place] [address]       Imaging Studies   No results found.   Medications   Scheduled Meds:  atorvastatin  20 mg Oral QHS   bisoprolol  5 mg Oral QPM   budesonide (PULMICORT) nebulizer solution  0.5 mg Nebulization BID  collagenase   Topical Daily   escitalopram  20 mg Oral Daily   famotidine  20 mg Oral QHS   ferrous gluconate  324 mg Oral Weekly   ipratropium-albuterol  3 mL Nebulization Q4H   loratadine  10 mg Oral QHS   methylPREDNISolone (SOLU-MEDROL) injection  60 mg Intravenous BID   mometasone-formoterol  2 puff Inhalation BID   And   umeclidinium bromide  1 puff Inhalation Daily   montelukast  10 mg Oral QHS   sodium chloride  2 g Oral BID WC   Continuous Infusions:  sodium chloride  Stopped (05/28/21 1600)   ampicillin-sulbactam (UNASYN) IV 3 g (05/31/21 1315)       LOS: 3 days     Enzo Bi, md [facility]  05/31/2021, 7:42 PM

## 2021-05-31 NOTE — Progress Notes (Signed)
Entered patient's room and noticed BIPAP has been removed. Communicated with RN and he stated patient removed BIPAP about 2hrs ago. Patient refuse to allow me to place BIPAP machine on again. Nebulizer given

## 2021-05-31 NOTE — Progress Notes (Signed)
Received call from Central[employer] that patient is now in Afib RVR sustaining in the 130's.  Ordered an EKG, Sharion Settler, NP happened to be on the floor and she was informed of change in patient.  EKG showed Afib RVR. No new orders at this time except for continue to monitor HR and notify provider if HR sustains in the 150's.

## 2021-05-31 NOTE — Progress Notes (Signed)
PT Cancellation Note  Patient Details Name: Paula Torres MRN: [id number] DOB: [birth year]   Cancelled Treatment:    Reason Eval/Treat Not Completed: Fatigue/lethargy limiting ability to participate. Chart reviewed. Pt and nursing staff report pt has just completed transfer EOB>BSC>recliner. Pt is now reporting fatigue and politely declining participation with PT. Will continue to follow and attempt next date.   Patrina Levering PT, DPT 05/31/21 3:24 PM [phone]

## 2021-06-01 DIAGNOSIS — T148XXA Other injury of unspecified body region, initial encounter: Secondary | ICD-10-CM | POA: Diagnosis not present

## 2021-06-01 DIAGNOSIS — J9621 Acute and chronic respiratory failure with hypoxia: Secondary | ICD-10-CM | POA: Diagnosis not present

## 2021-06-01 DIAGNOSIS — L089 Local infection of the skin and subcutaneous tissue, unspecified: Secondary | ICD-10-CM | POA: Diagnosis not present

## 2021-06-01 LAB — BASIC METABOLIC PANEL
Anion gap: 11 (ref 5–15)
BUN: 41 mg/dL — ABNORMAL HIGH (ref 8–23)
CO2: 39 mmol/L — ABNORMAL HIGH (ref 22–32)
Calcium: 9.2 mg/dL (ref 8.9–10.3)
Chloride: 88 mmol/L — ABNORMAL LOW (ref 98–111)
Creatinine, Ser: 1.12 mg/dL — ABNORMAL HIGH (ref 0.44–1.00)
GFR, Estimated: 53 mL/min — ABNORMAL LOW (ref >60)
Glucose, Bld: 211 mg/dL — ABNORMAL HIGH (ref 70–99)
Potassium: 4.1 mmol/L (ref 3.5–5.1)
Sodium: 138 mmol/L (ref 135–145)

## 2021-06-01 LAB — CBC
HCT: 27.7 % — ABNORMAL LOW (ref 36.0–46.0)
Hemoglobin: 8.2 g/dL — ABNORMAL LOW (ref 12.0–15.0)
MCH: 26.5 pg (ref 26.0–34.0)
MCHC: 29.6 g/dL — ABNORMAL LOW (ref 30.0–36.0)
MCV: 89.4 fL (ref 80.0–100.0)
Platelets: 449 10*3/uL — ABNORMAL HIGH (ref 150–400)
RBC: 3.1 MIL/uL — ABNORMAL LOW (ref 3.87–5.11)
RDW: 16.5 % — ABNORMAL HIGH (ref 11.5–15.5)
WBC: 23.5 10*3/uL — ABNORMAL HIGH (ref 4.0–10.5)
nRBC: 0.2 % (ref 0.0–0.2)

## 2021-06-01 LAB — MAGNESIUM: Magnesium: 2.5 mg/dL — ABNORMAL HIGH (ref 1.7–2.4)

## 2021-06-01 MED ORDER — PREDNISONE 20 MG PO TABS
20.0000 mg | ORAL_TABLET | Freq: Every day | ORAL | Status: DC
  Administered 2021-06-02 – 2021-06-05 (×3): 20 mg via ORAL
  Filled 2021-06-01 (×5): qty 1

## 2021-06-01 MED ORDER — BISOPROLOL FUMARATE 5 MG PO TABS
5.0000 mg | ORAL_TABLET | Freq: Two times a day (BID) | ORAL | Status: DC
  Administered 2021-06-01 – 2021-06-02 (×3): 5 mg via ORAL
  Filled 2021-06-01 (×5): qty 1

## 2021-06-01 MED ORDER — IPRATROPIUM-ALBUTEROL 0.5-2.5 (3) MG/3ML IN SOLN
3.0000 mL | Freq: Four times a day (QID) | RESPIRATORY_TRACT | Status: DC
  Administered 2021-06-01 – 2021-06-02 (×5): 3 mL via RESPIRATORY_TRACT
  Filled 2021-06-01 (×6): qty 3

## 2021-06-01 NOTE — Plan of Care (Signed)

## 2021-06-01 NOTE — Plan of Care (Signed)
Patient slept few hours, awake most of time watching TV, refused to wear BiPAP, stated it make her uncomfortable. Had one episode  of  AF& RVR, NP made aware. No BM, monitored closely. Problem: Clinical Measurements: Goal: Will remain free from infection Outcome: Progressing   Problem: Clinical Measurements: Goal: Respiratory complications will improve Outcome: Progressing   Problem: Activity: Goal: Risk for activity intolerance will decrease Outcome: Progressing   Problem: Clinical Measurements: Goal: Cardiovascular complication will be avoided Outcome: Progressing   Problem: Coping: Goal: Level of anxiety will decrease Outcome: Progressing

## 2021-06-01 NOTE — Progress Notes (Signed)
Patient continues to refuse to wear bipap, unit remains at bedside

## 2021-06-01 NOTE — Progress Notes (Signed)
   ID: Paula Torres is a 70 y.o. female  Principal Problem:   Wound infection_left lower leg Active Problems:   Hyperlipidemia   COPD exacerbation (HCC)   Pulmonary hypertension ([place])   Depression   Acute on chronic respiratory failure with hypoxia (HCC)   AF (paroxysmal atrial fibrillation) (HCC)   Severe sepsis (HCC)   Chronic diastolic CHF (congestive heart failure) (HCC)   Acute renal failure superimposed on stage 3a chronic kidney disease (HCC)   Hyponatremia   Hypokalemia   Iron deficiency anemia   Cellulitis of left lower leg    Subjective: Pt says she is feeling better Breathing better Pain left knee   Medications:   atorvastatin  20 mg Oral QHS   bisoprolol  5 mg Oral BID   budesonide (PULMICORT) nebulizer solution  0.5 mg Nebulization BID   collagenase   Topical Daily   escitalopram  20 mg Oral Daily   famotidine  20 mg Oral QHS   ferrous gluconate  324 mg Oral Weekly   ipratropium-albuterol  3 mL Nebulization Q6H   loratadine  10 mg Oral QHS   mometasone-formoterol  2 puff Inhalation BID   And   umeclidinium bromide  1 puff Inhalation Daily   montelukast  10 mg Oral QHS   [START ON 06/02/2021] predniSONE  20 mg Oral Q breakfast   sodium chloride  2 g Oral BID WC    Objective: Vital signs in last 24 hours: Temp:  [98.3 F (36.8 C)-98.5 F (36.9 C)] 98.5 F (36.9 C) (09/29 1500) Pulse Rate:  [108-110] 109 (09/29 1500) Resp:  [18-19] 19 (09/29 1500) BP: (130-140)/(76-81) 140/76 (09/29 1500) SpO2:  [94 %-97 %] 97 % (09/29 1500) Weight:  [136.2 kg] 136.2 kg (09/29 0410)  PHYSICAL EXAM:  General: Alert, cooperative, no distress, on nasal cannula  Extremities:  Left leg - knee wound which was a hematoma has eschar Bruising leg Edema legs     Skin: No rashes or lesions. Or bruising Lymph: Cervical, supraclavicular normal. Neurologic: Grossly non-focal  Lab Results Recent Labs    05/31/21 0538 06/01/21 0438  WBC 24.0* 23.5*  HGB 8.1*  8.2*  HCT 26.0* 27.7*  NA 136 138  K 3.6 4.1  CL 85* 88*  CO2 40* 39*  BUN 51* 41*  CREATININE 1.19* 1.12*  Microbiology: BC- Studies/Results: No results found.   Assessment/Plan: Acinetobacter bacteremia likely doe to knee wound  Knee wound - started a [place] hematoma/ecchymosis /brusing and now has an eschar with infection Culture sent Recommend surgical opinion as leucocytosis persisting   COPD/ OSA/ Obesity hypoventilation syndrome/corpulmonale  Anemia  Paroxysmal afib  Discussed the management with patient and her daughter and her nurse

## 2021-06-01 NOTE — Progress Notes (Signed)
OT Cancellation Note  Patient Details Name: Paula Torres MRN: [id number] DOB: 09/19/1950   Cancelled Treatment:    Reason Eval/Treat Not Completed: Medical issues which prohibited therapy. Upon attempt, pt HR 120's-141 at rest. Will hold OT tx at this time and re-attempt at later date/time as appropriate.   Ardeth Perfect., MPH, MS, OTR/L ascom [phone] 06/01/21, 2:52 PM

## 2021-06-01 NOTE — Progress Notes (Signed)
PROGRESS NOTE    Paula Torres   PJK:[id number]  DOB: [birth year]  PCP: Rusty Aus, MD    DOA: 05/28/2021 LOS: 4    Brief Narrative / Hospital Course to Date:   70 year old female with past medical history of diastolic CHF, hyperlipidemia, COPD chronically on 3 L/min supplemental oxygen, GERD, depression, paroxysmal A. fib on Eliquis, pulmonary hypertension, OSA noncompliant with BiPAP (supposed to use BiPAP but does not tolerate), iron deficiency anemia, CKD stage IIIa who presented to the ED on 05/28/2021 with progressive leg pain with bleeding and oozing after a mechanical fall 3 days prior.  Evaluation in the ED revealed severe sepsis secondary to left lower extremity wound infection and cellulitis.  Admitted to [facility] service and started on empiric IV antibiotics pending cultures.  Patient also with signs of acute COPD exacerbation started on IV steroids and bronchodilators.  Assessment & Plan   Principal Problem:   Wound infection_left lower leg Active Problems:   Hyperlipidemia   COPD exacerbation (HCC)   Pulmonary hypertension (HCC)   Depression   Acute on chronic respiratory failure with hypoxia (HCC)   AF (paroxysmal atrial fibrillation) (HCC)   Severe sepsis (HCC)   Chronic diastolic CHF (congestive heart failure) (HCC)   Acute renal failure superimposed on stage 3a chronic kidney disease (HCC)   Hyponatremia   Hypokalemia   Iron deficiency anemia   Cellulitis of left lower leg   Severe sepsis secondary to left lower extremity cellulitis and wound infection and secondary acinetobacter bacteremia  -patient met criteria for severe sepsis on admission with leukocytosis, tachypnea, lactic acidosis consistent with organ dysfunction.  Treated per sepsis protocol in the ED. Blood cultures growing acinetobacter baumannii LLE doppler U/S negative for DVT --ID consulted Plan: --cont Unasyn --wound care per order  Acute on chronic respiratory failure with hypoxia and  hypercapnia secondary to COPD with acute exacerbation  3L O2 at baseline Has advanced COPD and BiPAP recommended but patient states does not tolerate. 9/27 - pt more lethargic, ABG with pCO2 80 (up from 50).   Started on BiPAP (requires mittens to keep it on), transfer to stepdown. PCCM consulted. Plan: --transition from IV solumedrol to home prednisone 20 mg daily --Continue bronchodilators --Continue supplemental O2 to keep sats between 88-92%, wean as tolerated --pulm consult, Dr. Lanney Gins to see (pt's outpatient pulm doctor)  Hyponatremia  -present with sodium 129, likely due to poor p.o. intake, dehydration.   --Hold Lasix, spironolactone, metolazone.   --cont salt tablet (new)  Paroxysmal A. Fib, with RVR --developed RVR night of 9/28 --increase home bisoprolol to 5 mg BID --hold Eliquis due to wound bleeding  Chronic diastolic CHF -prior echo on 01/21/2020 with EF 60 to 65%.  Patient volume status difficult to assess on exam due to morbid obesity with chest x-ray was without any pulmonary edema.  Appears overall compensated. --Lasix held on admission due to worsened renal function and hyponatremia --Monitor volume status and resume diuretic when appropriate --Daily weights  AKI, POA, resolved on CKD stage IIIa  -creatinine appears 1.0-1.2.  Presented with creatinine 2.11 and BUN 64, likely due to dehydration with severe sepsis in addition to being on Lasix.  She was treated with IV fluids in the ED. --Hold lasix --encourage oral hydration  Pulmonary hypertension  -continue treprostinil  Depression  -continue Lexapro  Hyperlipidemia  -continue Lipitor  Acute on chronic iron deficiency anemia -hemoglobin in August was 10.3, presented with hemoglobin 7.9.  Suspect blood loss with wound bleeding.   --  1u pRBC on 9/27 for Hbg 6.8 Plan: --hold Eliquis --cont iron supplement --Monitor hemoglobin and transfuse if less than 7.0  OSA  -does not tolerate BiPAP at home or  here.  Pt asked for help to tolerate her BiPAP --pulm consult, Dr. Lanney Gins to see (pt's outpatient pulm doctor)   Morbid obesity: Body mass index is 49.97 kg/m.  Complicates overall care and prognosis.  Recommend lifestyle modifications including physical activity and diet for weight loss and overall long-term health.   DVT prophylaxis: SCD/Compression stockings Code Status: Full code  Family Communication:  Status is: inpatient Dispo:   The patient is from: home Anticipated d/c is to: SNF Anticipated d/c date is: 1-2 days for medical readniess  Patient currently is not medically stable to d/c due to: afib w RVR   Subjective 06/01/21    Pt had an episode of Afib w RVR overnight.  Rate slowed this morning.  Pt reported feeling rattled after talking with palliative care provider.  Now wanting to get help using her BiPAP nightly.   Consults, Procedures, Significant Events   Consultants:  None  Procedures:  None  Antimicrobials:  Anti-infectives (From admission, onward)    Start     Dose/Rate Route Frequency Ordered Stop   05/29/21 1100  cefTRIAXone (ROCEPHIN) 2 g in sodium chloride 0.9 % 100 mL IVPB  Status:  Discontinued        2 g 200 mL/hr over 30 Minutes Intravenous Every 24 hours 05/28/21 1244 05/29/21 0248   05/29/21 1000  Ampicillin-Sulbactam (UNASYN) 3 g in sodium chloride 0.9 % 100 mL IVPB        3 g 200 mL/hr over 30 Minutes Intravenous Every 6 hours 05/29/21 0825     05/29/21 0400  Ampicillin-Sulbactam (UNASYN) 3 g in sodium chloride 0.9 % 100 mL IVPB  Status:  Discontinued        3 g 200 mL/hr over 30 Minutes Intravenous Every 12 hours 05/29/21 0251 05/29/21 0353   05/29/21 0400  Ampicillin-Sulbactam (UNASYN) 3 g in sodium chloride 0.9 % 100 mL IVPB  Status:  Discontinued        3 g 200 mL/hr over 30 Minutes Intravenous Every 8 hours 05/29/21 0353 05/29/21 0825   05/28/21 1309  vancomycin variable dose per unstable renal function (pharmacist dosing)   Status:  Discontinued         Does not apply See admin instructions 05/28/21 1309 05/29/21 0258   05/28/21 1200  vancomycin (VANCOREADY) IVPB 1500 mg/300 mL        1,500 mg 150 mL/hr over 120 Minutes Intravenous  Once 05/28/21 1102 05/28/21 1657   05/28/21 1100  vancomycin (VANCOCIN) IVPB 1000 mg/200 mL premix        1,000 mg 200 mL/hr over 60 Minutes Intravenous  Once 05/28/21 1056 05/28/21 1554   05/28/21 1100  cefTRIAXone (ROCEPHIN) 2 g in sodium chloride 0.9 % 100 mL IVPB        2 g 200 mL/hr over 30 Minutes Intravenous  Once 05/28/21 1056 05/28/21 1553         Micro    Objective   Vitals:   06/01/21 0410 06/01/21 0727 06/01/21 1134 06/01/21 1500  BP:  130/81 135/79 140/76  Pulse:  (!) 110 (!) 108 (!) 109  Resp:  _0 Temp:  98.4 F (36.9 C) 98.3 F (36.8 C) 98.5 F (36.9 C)  TempSrc:  Oral Oral Oral  SpO2:  95% 97% 97%  Weight: (!) 136.2  kg     Height:        Intake/Output Summary (Last 24 hours) at 06/01/2021 1618 Last data filed at 06/01/2021 1405 Gross per 24 hour  Intake 940 ml  Output 1100 ml  Net -160 ml   Filed Weights   05/30/21 0517 05/31/21 0500 06/01/21 0410  Weight: 132.5 kg 132.4 kg (!) 136.2 kg    Physical Exam:  Constitutional: NAD, AAOx3 HEENT: conjunctivae and lids normal, EOMI CV: No cyanosis.   RESP: pursed-lip breathing today, on 5L Extremities: left knee more swollen, with large area of eschar SKIN: warm, dry, many scabs and scratches present Neuro: II - XII grossly intact.   Psych: Normal mood and affect.  Appropriate judgement and reason   Labs   Data Reviewed: I have personally reviewed following labs and imaging studies  CBC: Recent Labs  Lab 05/28/21 0922 05/28/21 1246 05/29/21 0658 05/29/21 1939 05/30/21 0501 05/30/21 1548 05/31/21 0538 06/01/21 0438  WBC 28.2*  --  28.3*  --  23.7*  --  24.0* 23.5*  NEUTROABS 24.5*  --   --   --   --   --   --   --   HGB 7.9*   < > 7.2* 7.2* 6.8* 8.3* 8.1* 8.2*  HCT 24.2*    < > 22.4* 22.4* 21.6* 25.9* 26.0* 27.7*  MCV 82.9  --  82.1  --  86.1  --  88.7 89.4  PLT 399  --  386  --  382  --  440* 449*   < > = values in this interval not displayed.   Basic Metabolic Panel: Recent Labs  Lab 05/28/21 0922 05/29/21 0658 05/30/21 0501 05/31/21 0538 06/01/21 0438  NA 129* 130* 135 136 138  K 3.1* 3.3* 3.5 3.6 4.1  CL 73* 77* 82* 85* 88*  CO2 39* 38* 38* 40* 39*  GLUCOSE 135* 181* 186* 245* 211*  BUN 64* 57* 59* 51* 41*  CREATININE 2.11* 1.60* 1.26* 1.19* 1.12*  CALCIUM 9.1 9.0 9.0 9.2 9.2  MG 2.3  --  2.8* 2.8* 2.5*   GFR: Estimated Creatinine Clearance: 66.4 mL/min (A) (by C-G formula based on SCr of 1.12 mg/dL (H)). Liver Function Tests: Recent Labs  Lab 05/28/21 0922  AST 15  ALT 8  ALKPHOS 101  BILITOT 1.2  PROT 6.6  ALBUMIN 3.2*   No results for input(s): LIPASE, AMYLASE in the last 168 hours. No results for input(s): AMMONIA in the last 168 hours. Coagulation Profile: No results for input(s): INR, PROTIME in the last 168 hours. Cardiac Enzymes: No results for input(s): CKTOTAL, CKMB, CKMBINDEX, TROPONINI in the last 168 hours. BNP (last 3 results) No results for input(s): PROBNP in the last 8760 hours. HbA1C: No results for input(s): HGBA1C in the last 72 hours. CBG: No results for input(s): GLUCAP in the last 168 hours. Lipid Profile: No results for input(s): CHOL, HDL, LDLCALC, TRIG, CHOLHDL, LDLDIRECT in the last 72 hours. Thyroid Function Tests: No results for input(s): TSH, T4TOTAL, FREET4, T3FREE, THYROIDAB in the last 72 hours. Anemia Panel: Recent Labs    05/30/21 0501  VITAMINB12 343  FOLATE 5.3*  FERRITIN 78  TIBC 403  IRON 26*  RETICCTPCT 5.2*   Sepsis Labs: Recent Labs  Lab 05/28/21 0922 05/28/21 1056  PROCALCITON 0.36  --   LATICACIDVEN 2.5* 1.7    Recent Results (from the past 240 hour(s))  Blood culture (routine x 2)     Status: Abnormal   Collection Time:  05/28/21 10:56 AM   Specimen: BLOOD   Result Value Ref Range Status   Specimen Description   Final    BLOOD RIGHT ANTECUBITAL Performed at [facility], [address] [place], [place] [address]    Special Requests   Final    BOTTLES DRAWN AEROBIC AND ANAEROBIC Blood Culture adequate volume Performed at [facility], [address] [place], [place] [address]    Culture  Setup Time   Final    GRAM NEGATIVE COCCOBACILLI AEROBIC BOTTLE ONLY CRITICAL VALUE NOTED.  VALUE IS CONSISTENT WITH PREVIOUSLY REPORTED AND CALLED VALUE.    Culture (A)  Final    ACINETOBACTER CALCOACETICUS/BAUMANNII COMPLEX SUSCEPTIBILITIES PERFORMED ON PREVIOUS CULTURE WITHIN THE LAST 5 DAYS. Performed at [facility], [address] [place], [place] [address]    Report Status 05/31/2021 FINAL  Final  Blood culture (routine x 2)     Status: Abnormal   Collection Time: 05/28/21 10:57 AM   Specimen: BLOOD  Result Value Ref Range Status   Specimen Description   Final    BLOOD BLOOD LEFT FOREARM Performed at [facility], [address] [place], [place] [address]    Special Requests   Final    BOTTLES DRAWN AEROBIC AND ANAEROBIC Blood Culture results may not be optimal due to an inadequate volume of blood received in culture bottles Performed at [facility], [address] [place], [place] [address]    Culture  Setup Time   Final    Organism ID to follow GRAM NEGATIVE COCCOBACILLI IN BOTH AEROBIC AND ANAEROBIC BOTTLES CRITICAL RESULT CALLED TO, READ BACK BY AND VERIFIED WITH: AUSTIN REEVES @ 2327 05/28/21 LFD Performed at [facility], [address] [place], [place] [address]    Culture ACINETOBACTER CALCOACETICUS/BAUMANNII COMPLEX (A)  Final   Report Status 05/31/2021 FINAL  Final   Organism ID, Bacteria ACINETOBACTER CALCOACETICUS/BAUMANNII COMPLEX  Final      Susceptibility   Acinetobacter calcoaceticus/baumannii complex - MIC*    CEFTAZIDIME 4 SENSITIVE Sensitive      CIPROFLOXACIN >=4 RESISTANT Resistant     GENTAMICIN 2 SENSITIVE Sensitive     IMIPENEM <=0.25 SENSITIVE Sensitive     PIP/TAZO <=4 SENSITIVE Sensitive     TRIMETH/SULFA <=20 SENSITIVE Sensitive     AMPICILLIN/SULBACTAM <=2 SENSITIVE Sensitive     * ACINETOBACTER CALCOACETICUS/BAUMANNII COMPLEX  Blood Culture ID Panel (Reflexed)     Status: Abnormal (Preliminary result)   Collection Time: 05/28/21 10:57 AM  Result Value Ref Range Status   Enterococcus faecalis NOT DETECTED NOT DETECTED Final   Enterococcus Faecium NOT DETECTED NOT DETECTED Final   Listeria monocytogenes NOT DETECTED NOT DETECTED Final   Staphylococcus species NOT DETECTED NOT DETECTED Final   Staphylococcus aureus (BCID) PENDING NOT DETECTED Incomplete   Staphylococcus epidermidis NOT DETECTED NOT DETECTED Final   Staphylococcus lugdunensis NOT DETECTED NOT DETECTED Final   Streptococcus species NOT DETECTED NOT DETECTED Final   Streptococcus agalactiae NOT DETECTED NOT DETECTED Final   Streptococcus pneumoniae NOT DETECTED NOT DETECTED Final   Streptococcus pyogenes NOT DETECTED NOT DETECTED Final   A.calcoaceticus-baumannii DETECTED (A) NOT DETECTED Final    Comment: CRITICAL RESULT CALLED TO, READ BACK BY AND VERIFIED WITH: AUSTIN REEVES @ 2327 05/28/21 LFD    Bacteroides fragilis NOT DETECTED NOT DETECTED Final   Enterobacterales NOT DETECTED NOT DETECTED Final   Enterobacter cloacae complex NOT DETECTED NOT DETECTED Final   Escherichia coli NOT DETECTED NOT DETECTED Final  Klebsiella aerogenes NOT DETECTED NOT DETECTED Final   Klebsiella oxytoca NOT DETECTED NOT DETECTED Final   Klebsiella pneumoniae NOT DETECTED NOT DETECTED Final   Proteus species NOT DETECTED NOT DETECTED Final   Salmonella species NOT DETECTED NOT DETECTED Final   Serratia marcescens NOT DETECTED NOT DETECTED Final   Haemophilus influenzae NOT DETECTED NOT DETECTED Final   Neisseria meningitidis NOT DETECTED NOT DETECTED Final    Pseudomonas aeruginosa NOT DETECTED NOT DETECTED Final   Stenotrophomonas maltophilia NOT DETECTED NOT DETECTED Final   Candida albicans NOT DETECTED NOT DETECTED Final   Candida auris NOT DETECTED NOT DETECTED Final   Candida glabrata NOT DETECTED NOT DETECTED Final   Candida krusei NOT DETECTED NOT DETECTED Final   Candida parapsilosis NOT DETECTED NOT DETECTED Final   Candida tropicalis NOT DETECTED NOT DETECTED Final   Cryptococcus neoformans/gattii NOT DETECTED NOT DETECTED Final   CTX-M ESBL NOT DETECTED NOT DETECTED Final   Carbapenem resistance IMP NOT DETECTED NOT DETECTED Final   Carbapenem resistance KPC NOT DETECTED NOT DETECTED Final   Carbapenem resistance NDM NOT DETECTED NOT DETECTED Final   Carbapenem resistance VIM NOT DETECTED NOT DETECTED Final    Comment: Performed at [facility], [address] [place], [place] [address]  Resp Panel by RT-PCR (Flu A&B, Covid) Nasopharyngeal Swab     Status: None   Collection Time: 05/28/21 12:46 PM   Specimen: Nasopharyngeal Swab; Nasopharyngeal(NP) swabs in vial transport medium  Result Value Ref Range Status   SARS Coronavirus 2 by RT PCR NEGATIVE NEGATIVE Final    Comment: (NOTE) SARS-CoV-2 target nucleic acids are NOT DETECTED.  The SARS-CoV-2 RNA is generally detectable in upper respiratory specimens during the acute phase of infection. The lowest concentration of SARS-CoV-2 viral copies this assay can detect is 138 copies/mL. A negative result does not preclude SARS-Cov-2 infection and should not be used as the sole basis for treatment or other patient management decisions. A negative result may occur with  improper specimen collection/handling, submission of specimen other than nasopharyngeal swab, presence of viral mutation(s) within the areas targeted by this assay, and inadequate number of viral copies(<138 copies/mL). A negative result must be combined with clinical observations, patient history, and  epidemiological information. The expected result is Negative.  Fact Sheet for Patients:  EntrepreneurPulse.com.au  Fact Sheet for Healthcare Providers:  IncredibleEmployment.be  This test is no t yet approved or cleared by the [place] FDA and  has been authorized for detection and/or diagnosis of SARS-CoV-2 by FDA under an Emergency Use Authorization (EUA). This EUA will remain  in effect (meaning this test can be used) for the duration of the COVID-19 declaration under Section 564(b)(1) of the Act, 21 U.S.C.section 360bbb-3(b)(1), unless the authorization is terminated  or revoked sooner.       Influenza A by PCR NEGATIVE NEGATIVE Final   Influenza B by PCR NEGATIVE NEGATIVE Final    Comment: (NOTE) The Xpert Xpress SARS-CoV-2/FLU/RSV plus assay is intended as an aid in the diagnosis of influenza from Nasopharyngeal swab specimens and should not be used as a sole basis for treatment. Nasal washings and aspirates are unacceptable for Xpert Xpress SARS-CoV-2/FLU/RSV testing.  Fact Sheet for Patients: EntrepreneurPulse.com.au  Fact Sheet for Healthcare Providers: IncredibleEmployment.be  This test is not yet approved or cleared by the [place] FDA and has been authorized for detection and/or diagnosis of SARS-CoV-2 by FDA under an Emergency Use Authorization (EUA). This EUA will remain in effect (meaning this test  can be used) for the duration of the COVID-19 declaration under Section 564(b)(1) of the Act, 21 U.S.C. section 360bbb-3(b)(1), unless the authorization is terminated or revoked.  Performed at [facility], [address] [place], [place] [address]       Imaging Studies   No results found.   Medications   Scheduled Meds:  atorvastatin  20 mg Oral QHS   bisoprolol  5 mg Oral QPM   budesonide (PULMICORT) nebulizer solution  0.5 mg Nebulization BID   collagenase    Topical Daily   escitalopram  20 mg Oral Daily   famotidine  20 mg Oral QHS   ferrous gluconate  324 mg Oral Weekly   ipratropium-albuterol  3 mL Nebulization Q6H   loratadine  10 mg Oral QHS   mometasone-formoterol  2 puff Inhalation BID   And   umeclidinium bromide  1 puff Inhalation Daily   montelukast  10 mg Oral QHS   [START ON 06/02/2021] predniSONE  20 mg Oral Q breakfast   sodium chloride  2 g Oral BID WC   Continuous Infusions:  sodium chloride Stopped (05/28/21 1600)   ampicillin-sulbactam (UNASYN) IV 3 g (06/01/21 1405)       LOS: 4 days     Enzo Bi, md [facility]  06/01/2021, 4:18 PM

## 2021-06-01 NOTE — Progress Notes (Signed)
PT Cancellation Note  Patient Details Name: Paula Torres MRN: [id number] DOB: 10/29/1950   Cancelled Treatment:    Reason Eval/Treat Not Completed: Patient not medically ready  Attempted x 2.  HR at rest 130's.  Will continue to monitor and continue at a later time/date as appropriate.  If able will try to see pt this pm if HR is within limits.   Chesley Noon 06/01/2021, 10:12 AM

## 2021-06-02 DIAGNOSIS — R7881 Bacteremia: Secondary | ICD-10-CM | POA: Diagnosis not present

## 2021-06-02 DIAGNOSIS — T148XXA Other injury of unspecified body region, initial encounter: Secondary | ICD-10-CM | POA: Diagnosis not present

## 2021-06-02 DIAGNOSIS — L089 Local infection of the skin and subcutaneous tissue, unspecified: Secondary | ICD-10-CM | POA: Diagnosis not present

## 2021-06-02 LAB — BASIC METABOLIC PANEL
Anion gap: 11 (ref 5–15)
BUN: 44 mg/dL — ABNORMAL HIGH (ref 8–23)
CO2: 36 mmol/L — ABNORMAL HIGH (ref 22–32)
Calcium: 8.9 mg/dL (ref 8.9–10.3)
Chloride: 90 mmol/L — ABNORMAL LOW (ref 98–111)
Creatinine, Ser: 1.25 mg/dL — ABNORMAL HIGH (ref 0.44–1.00)
GFR, Estimated: 47 mL/min — ABNORMAL LOW (ref >60)
Glucose, Bld: 229 mg/dL — ABNORMAL HIGH (ref 70–99)
Potassium: 4.5 mmol/L (ref 3.5–5.1)
Sodium: 137 mmol/L (ref 135–145)

## 2021-06-02 LAB — CBC
HCT: 29.1 % — ABNORMAL LOW (ref 36.0–46.0)
Hemoglobin: 8.7 g/dL — ABNORMAL LOW (ref 12.0–15.0)
MCH: 26.9 pg (ref 26.0–34.0)
MCHC: 29.9 g/dL — ABNORMAL LOW (ref 30.0–36.0)
MCV: 89.8 fL (ref 80.0–100.0)
Platelets: 476 10*3/uL — ABNORMAL HIGH (ref 150–400)
RBC: 3.24 MIL/uL — ABNORMAL LOW (ref 3.87–5.11)
RDW: 16.9 % — ABNORMAL HIGH (ref 11.5–15.5)
WBC: 24.5 10*3/uL — ABNORMAL HIGH (ref 4.0–10.5)
nRBC: 0.2 % (ref 0.0–0.2)

## 2021-06-02 LAB — PROCALCITONIN: Procalcitonin: <0.1 ng/mL

## 2021-06-02 LAB — MAGNESIUM: Magnesium: 2.4 mg/dL (ref 1.7–2.4)

## 2021-06-02 NOTE — Progress Notes (Signed)
Physical Therapy Treatment Patient Details Name: Paula Torres MRN: [id number] DOB: 11-Jul-1951 Today's Date: 06/02/2021   History of Present Illness 70 y.o. female with medical history significant of dCHF, HLD, COPD on 3 L oxygen, GERD, depression, PAF on Eliquis, pulmonary HTN, OSA on BiPAP, CAD, iron deficiency anemia, CKD-3A, who presents with left leg wound with bleeding and pain.    PT Comments    Pt HR remains elevated this am 120' -130' at rest and briefly to 140's while talking with pt.  PT sessions have been very limited due to resting HR.  Session spent today on education and HEP.  Discussed HR limits and doing supine HEP as tolerated during the day if HR is lower and resting if high 120's/130's.  She stated at time during the day HR is lower and is encouraged to do HEP as tolerated at those times. She voiced understanding.  Supine HEP was taught and reviewed with limited reps for each ex for teaching purposes.     Recommendations for follow up therapy are one component of a multi-disciplinary discharge planning process, led by the attending physician.  Recommendations may be updated based on patient status, additional functional criteria and insurance authorization.  Follow Up Recommendations  SNF;Supervision for mobility/OOB     Equipment Recommendations       Recommendations for Other Services       Precautions / Restrictions Precautions Precautions: Fall Precaution Comments: monitor O2 sats, HR Restrictions Weight Bearing Restrictions: No Other Position/Activity Restrictions: BLE L>R wounds     Mobility  Bed Mobility                    Transfers                    Ambulation/Gait                 Stairs             Wheelchair Mobility    Modified Rankin (Stroke Patients Only)       Balance                                            Cognition Arousal/Alertness: Awake/alert Behavior During Therapy:  WFL for tasks assessed/performed Overall Cognitive Status: Within Functional Limits for tasks assessed                                        Exercises Other Exercises Other Exercises: HEP education    General Comments        Pertinent Vitals/Pain Pain Assessment: Faces Faces Pain Scale: Hurts little more Pain Location: BLE Pain Descriptors / Indicators: Aching;Grimacing Pain Intervention(s): Limited activity within patient's tolerance;Monitored during session    Home Living                      Prior Function            PT Goals (current goals can now be found in the care plan section) Progress towards PT goals: Not progressing toward goals - comment    Frequency    Min 2X/week      PT Plan Current plan remains appropriate    Co-evaluation  AM-PAC PT "6 Clicks" Mobility   Outcome Measure  Help needed turning from your back to your side while in a flat bed without using bedrails?: A Little Help needed moving from lying on your back to sitting on the side of a flat bed without using bedrails?: A Little Help needed moving to and from a bed to a chair (including a wheelchair)?: A Little Help needed standing up from a chair using your arms (e.g., wheelchair or bedside chair)?: A Little Help needed to walk in hospital room?: A Lot Help needed climbing 3-5 steps with a railing? : Total 6 Click Score: 15    End of Session   Activity Tolerance: Patient tolerated treatment well;Treatment limited secondary to medical complications (Comment) Patient left: in bed;with call bell/phone within reach;with bed alarm set;Other (comment) Nurse Communication: Mobility status PT Visit Diagnosis: Unsteadiness on feet (R26.81);Other abnormalities of gait and mobility (R26.89);Repeated falls (R29.6);Muscle weakness (generalized) (M62.81);Difficulty in walking, not elsewhere classified (R26.2);Pain Pain - Right/Left: Left Pain - part of  body: Knee;Leg     Time: 2091-9802 PT Time Calculation (min) (ACUTE ONLY): 12 min  Charges:  $Therapeutic Exercise: 8-22 mins                    Chesley Noon, PTA 06/02/21, 9:10 AM

## 2021-06-02 NOTE — Progress Notes (Signed)
ID Pt sitting in bed No specific complaints No breathing  issues  O/e awake and alert BP 111/73 (BP Location: Left Arm)   Pulse (!) 126   Temp 98.2 F (36.8 C)   Resp 19   Ht 5' 5" (1.651 m)   Wt 131.5 kg   SpO2 96%   BMI 48.24 kg/m    Chest b/l air entry Hss1s2 CNS non focal Left leg Swelling less Bruising fading Patella wound eschar is covered with santyl  Labs CBC Latest Ref Rng & Units 06/02/2021 06/01/2021 05/31/2021  WBC 4.0 - 10.5 K/uL 24.5(H) 23.5(H) 24.0(H)  Hemoglobin 12.0 - 15.0 g/dL 8.7(L) 8.2(L) 8.1(L)  Hematocrit 36.0 - 46.0 % 29.1(L) 27.7(L) 26.0(L)  Platelets 150 - 400 K/uL 476(H) 449(H) 440(H)    CMP Latest Ref Rng & Units 06/02/2021 06/01/2021 05/31/2021  Glucose 70 - 99 mg/dL 229(H) 211(H) 245(H)  BUN 8 - 23 mg/dL 44(H) 41(H) 51(H)  Creatinine 0.44 - 1.00 mg/dL 1.25(H) 1.12(H) 1.19(H)  Sodium 135 - 145 mmol/L 137 138 136  Potassium 3.5 - 5.1 mmol/L 4.5 4.1 3.6  Chloride 98 - 111 mmol/L 90(L) 88(L) 85(L)  CO2 22 - 32 mmol/L 36(H) 39(H) 40(H)  Calcium 8.9 - 10.3 mg/dL 8.9 9.2 9.2  Total Protein 6.5 - 8.1 g/dL - - -  Total Bilirubin 0.3 - 1.2 mg/dL - - -  Alkaline Phos 38 - 126 U/L - - -  AST 15 - 41 U/L - - -  ALT 0 - 44 U/L - - -     BC- acinetobacter  Impression/recommendation Acinetobacter bacteremia likely due to knee wound Continue uansun as patient very stable Leucocytosis could be from IV steroids which was changed to prednisone yesterday- so will wait for a couple of days to see whether it will resolve No change in antibiotics now   Knee wound - started as hematoma/ecchymosis /brusing and now has an eschar with infection Culture sent Recommend surgical opinion as leucocytosis persisting, but today the wound may be a tad better      COPD/ OSA/ Obesity hypoventilation syndrome/corpulmonale   Anemia   Paroxysmal afib   Discussed the management with patient and her  nurse and hospitalist ID will follow her peripherally this  weekend

## 2021-06-02 NOTE — TOC Initial Note (Signed)
Transition of Care [facility]) - Initial/Assessment Note    Patient Details  Name: Paula Torres MRN: [id number] Date of Birth: 12-04-1950  Transition of Care [facility]) CM/SW Contact:    Alberteen Sam, LCSW Phone Number: 06/02/2021, 9:33 AM  Clinical Narrative:                  CSW spoke with patient's daughter  [name]. She reports patient's husband is in hospital [facility] as well in [place]. Husband is long term at [facility]. [name] reports to talk to patient regarding if she wants to be at [facility] with her husband or to try to find a different facility.   [name] did report patient has a bipap at home she can take to facility when discharges.   TOC floor RNCM will follow up with patient today for SNF choice.    Expected Discharge Plan: Skilled Nursing Facility Barriers to Discharge: Continued Medical Work up   Patient Goals and CMS Choice Patient states their goals for this hospitalization and ongoing recovery are:: to go home CMS Medicare.gov Compare Post Acute Care list provided to:: Patient Represenative (must comment) (daughter) Choice offered to / list presented to : Adult Children  Expected Discharge Plan and Services Expected Discharge Plan: [place] Acute Care Choice: [place] Living arrangements for the past 2 months: Single Family Home                                      Prior Living Arrangements/Services Living arrangements for the past 2 months: Single Family Home Lives with:: Self            Care giver support system in place?: Yes (comment)      Activities of Daily Living Home Assistive Devices/Equipment: Environmental consultant (specify type) ADL Screening (condition at time of admission) Patient's cognitive ability adequate to safely complete daily activities?: Yes Is the patient deaf or have difficulty hearing?: No Does the patient have difficulty seeing, even when wearing glasses/contacts?: No Does the  patient have difficulty concentrating, remembering, or making decisions?: No Patient able to express need for assistance with ADLs?: Yes Does the patient have difficulty dressing or bathing?: No Independently performs ADLs?: Yes (appropriate for developmental age) Does the patient have difficulty walking or climbing stairs?: Yes Weakness of Legs: Both Weakness of Arms/Hands: None  Permission Sought/Granted                  Emotional Assessment Appearance:: Appears stated age Attitude/Demeanor/Rapport: Gracious Affect (typically observed): Calm Orientation: : Oriented to Self, Oriented to Place, Oriented to  Time, Oriented to Situation Alcohol / Substance Use: Not Applicable Psych Involvement: No (comment)  Admission diagnosis:  SOB (shortness of breath) [R06.02] COPD exacerbation (HCC) [J44.1] Left leg cellulitis [L03.116] Patient Active Problem List   Diagnosis Date Noted   Severe sepsis ([place]) 05/28/2021   Wound infection_left lower leg 05/28/2021   COPD (chronic obstructive pulmonary disease) ([place]) 05/28/2021   Chronic diastolic CHF (congestive heart failure) ([place]) 05/28/2021   Acute renal failure superimposed on stage 3a chronic kidney disease ([place]) 05/28/2021   Hyponatremia 05/28/2021   Hypokalemia 05/28/2021   Iron deficiency anemia 05/28/2021   Cellulitis of left lower leg 05/28/2021   Acute on chronic respiratory failure ([place]) 04/25/2021   Acute on chronic respiratory failure with hypoxia ([place]) 05/11/2020   AF (paroxysmal atrial fibrillation) ([place])  05/11/2020   Acute on chronic diastolic congestive heart failure (HCC)    Acute respiratory distress    Pulmonary hypertension (HCC)    Restless leg syndrome    Depression    COPD exacerbation ([place]) 05/09/2020   Atrial fibrillation with RVR ([place]) 01/21/2020   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[place]) 01/21/2020   Abnormal cardiovascular stress test    Demand ischemia (HCC)    Hyperlipidemia    COPD  with acute exacerbation ([place]) 11/08/2019   Acute on chronic diastolic CHF (congestive heart failure) ([place]) 11/08/2019   Acute respiratory failure with hypoxia and hypercapnia ([place]) 11/08/2019   Obesity, Class III, BMI 40-49.9 (morbid obesity) ([place]) 11/08/2019   Elevated troponin 11/08/2019   PCP:  Rusty Aus, MD Pharmacy:   [facility] [address] [place], [place] [address] Phone: [phone] Fax: [phone]     Social Determinants of Health ([place]) Interventions    Readmission Risk Interventions No flowsheet data found.

## 2021-06-02 NOTE — Consult Note (Signed)
ORTHOPAEDIC CONSULTATION  REQUESTING PHYSICIAN: Enzo Bi, MD  Chief Complaint:   Left prepatellar wound.  History of Present Illness: Paula Torres is a 70 y.o. female with multiple medical problems including COPD, congestive heart failure, coronary artery disease, hypertension, sleep apnea, morbid obesity, paroxysmal atrial fibrillation requiring blood thinners, and pulmonary hypertension who normally lives with her husband at home and is supposed to ambulate with a walker due to her poor balance.  Apparently she got up to go to the bathroom and fell onto her flexed knee 8 days ago.  She presented the emergency room where she was noted to have swelling and bruising over the prepatellar region.  X-rays of her knee were unremarkable for any acute bony pathology.  The patient was sent home, but returned to the emergency room 3 days later with "weeping wounds" in the left lower leg as well as bleeding and evidence for sepsis secondary to cellulitis of her left lower leg.  She also was having worsening respiratory issues and so was admitted for medical optimization and wound care.  The patient has been receiving daily dressing changes as recommended by the wound care clinic.  She also was seen by Dr. Francine Graven of Infectious Diseases who has been managing her IV antibiotics.  She requested orthopedic consultation to determine whether surgical debridement of the area of eschar over the left knee was warranted.  Past Medical History:  Diagnosis Date   (HFpEF) heart failure with preserved ejection fraction (HCC)    Chronic respiratory failure with hypoxia (HCC)    COPD (chronic obstructive pulmonary disease) (HCC)    Coronary artery disease, non-occlusive    Hypertension    OSA (obstructive sleep apnea)    PAF (paroxysmal atrial fibrillation) ([place])    Pulmonary hypertension (HCC)    Past Surgical History:  Procedure Laterality Date    NO PAST SURGERIES     RIGHT/LEFT HEART CATH AND CORONARY ANGIOGRAPHY Left 12/21/2019   Procedure: RIGHT/LEFT HEART CATH AND CORONARY ANGIOGRAPHY;  Surgeon: Wellington Hampshire, MD;  Location: [facility];  Service: Cardiovascular;  Laterality: Left;   Social History   Socioeconomic History   Marital status: Married    Spouse name: Not on file   Number of children: Not on file   Years of education: Not on file   Highest education level: Not on file  Occupational History   Not on file  Tobacco Use   Smoking status: Former    Types: Cigarettes    Quit date: 06/03/2017    Years since quitting: 4.0   Smokeless tobacco: Never  Vaping Use   Vaping Use: Never used  Substance and Sexual Activity   Alcohol use: Not Currently   Drug use: Never   Sexual activity: Not on file  Other Topics Concern   Not on file  Social History Narrative   Not on file   Social Determinants of Health   Financial Resource Strain: Not on file  Food Insecurity: Not on file  Transportation Needs: Not on file  Physical Activity: Not on file  Stress: Not on file  Social Connections: Not on file   Family History  Problem Relation Age of Onset   Hypertension Other    Allergies  Allergen Reactions   Codeine     Back Headache   Sulfa Antibiotics     Reported to pt from mother who is no longer living -  Details unknown   Prior to Admission medications   Medication Sig Start Date  End Date Taking? Authorizing Provider  albuterol (VENTOLIN HFA) 108 (90 Base) MCG/ACT inhaler Inhale 2 puffs into the lungs every 6 (six) hours as needed for wheezing or shortness of breath.   Yes [provider]  atorvastatin (LIPITOR) 20 MG tablet Take 1 tablet (20 mg total) by mouth daily at 6 PM. Patient taking differently: Take 20 mg by mouth at bedtime. 11/23/19  Yes Loel Dubonnet, NP  azithromycin (ZITHROMAX) 250 MG tablet Take 250 mg by mouth every Monday, Wednesday, and Friday.   Yes [provider]  bisoprolol (ZEBETA) 5 MG tablet Take 5 mg by mouth every evening.   Yes [provider]  docusate sodium (COLACE) 100 MG capsule Take 1 tablet once or twice daily as needed for constipation while taking narcotic pain medicine 05/25/21  Yes Hinda Kehr, MD  ELIQUIS 5 MG TABS tablet TAKE 1 TABLET(5 MG) BY MOUTH TWICE DAILY 05/30/20  Yes Dunn, Areta Haber, PA-C  escitalopram (LEXAPRO) 20 MG tablet Take 20 mg by mouth daily.   Yes [provider]  famotidine (PEPCID) 20 MG tablet Take 20 mg by mouth at bedtime.   Yes [provider]  ferrous gluconate (FERGON) 324 MG tablet Take 324 mg by mouth once a week.   Yes [provider]  fluticasone (FLONASE) 50 MCG/ACT nasal spray Place 2 sprays into both nostrils daily as needed for allergies.   Yes [provider]  Fluticasone-Umeclidin-Vilant 100-62.5-25 MCG/INH AEPB Inhale 1 puff into the lungs daily.   Yes [provider]  furosemide (LASIX) 40 MG tablet Take 2 tablets (80 mg total) by mouth daily. 04/27/21  Yes Fritzi Mandes, MD  ipratropium-albuterol (DUONEB) 0.5-2.5 (3) MG/3ML SOLN Take 3 mLs by nebulization every 6 (six) hours as needed (for wheezing/shortness of breath).   Yes [provider]  loratadine (CLARITIN) 10 MG tablet Take 1 tablet (10 mg total) by mouth daily. Patient taking differently: Take 10 mg by mouth at bedtime. 05/13/20  Yes Nicole Kindred A, DO  metolazone (ZAROXOLYN) 2.5 MG tablet Take 1 tablet (2.5 mg total) by mouth daily. Patient taking differently: Take 2.5 mg by mouth every other day. 05/04/21 08/02/21 Yes Hackney, Tina A, FNP  montelukast (SINGULAIR) 10 MG tablet Take 10 mg by mouth at bedtime.   Yes [provider]  oxyCODONE-acetaminophen (PERCOCET) 5-325 MG tablet Take 2 tablets by mouth every 6 (six) hours as needed for severe pain. 05/25/21  Yes Hinda Kehr, MD  predniSONE (DELTASONE) 20 MG tablet Take 20 mg by mouth every other day.   Yes  [provider]  spironolactone (ALDACTONE) 25 MG tablet Take 1 tablet (25 mg total) by mouth daily. Patient taking differently: Take 50 mg by mouth daily. 11/23/19  Yes Loel Dubonnet, NP  Vitamin D, Ergocalciferol, (DRISDOL) 1.25 MG (50000 UNIT) CAPS capsule Take 50,000 Units by mouth every Saturday.   Yes [provider]  Treprostinil Diolamine ER 0.25 MG TBCR Take 0.25 mg by mouth in the morning and at bedtime.    [provider]   No results found.  Positive ROS: All other systems have been reviewed and were otherwise negative with the exception of those mentioned in the HPI and as above.  Physical Exam: General:  Alert, no acute distress Psychiatric:  Patient is competent for consent with normal mood and affect   Cardiovascular:  No pedal edema Respiratory:  No wheezing, non-labored breathing GI:  Abdomen is soft and non-tender Skin:  No lesions in  the area of chief complaint Neurologic:  Sensation intact distally Lymphatic:  No axillary or cervical lymphadenopathy  Orthopedic Exam:  Orthopedic examination is limited to the left knee region.  Skin inspection of the left knee is notable for a large eschar over the prepatellar region measuring approximately 5 x 8 cm.  There is a second smaller area of eschar just superior to the first eschar which measures approximately 1.5 x 2 cm.  There is a ring of resolving ecchymosis around this area, but no erythema to suggest acute active cellulitis at this time.  In addition, there is no noticeable knee effusion.  The patient is able to flex and extend her knee with effort, but without significant pain.  She is able to dorsiflex and plantarflex her toes and ankle.  Sensation is grossly intact to light touch throughout her left lower leg and foot.  X-rays:  Recent AP, lateral, and oblique views of the left knee are available for review and have been reviewed by myself.  These films demonstrate no evidence for fractures,  lytic lesions, or significant degenerative changes.  Assessment: Large prepatellar eschar status post fall, left knee.  Plan: The treatment options have been discussed with the patient.  At this point, I do not feel there is any need to debride the eschar from the prepatellar region of her knee.  In fact, I feel that it is contraindicated at this time.  The eschar serves as a good biologic barrier to infection, especially in the absence of gross cellulitis.  If this were to be debrided, it is quite likely that the patellar tendon and periosteum over the patella would soon become exposed, making it less and less likely for the wound to heal without a free flap or other more extensive coverage technique.  I would defer to the wound management people asked to the best ointments and dressings to apply over this to minimize its risk of becoming infected while optimizing its healing potential.  Thank you for asking me to participate in the care of this most unfortunate woman.  I will be available and follow her from a distance.   Pascal Lux, MD  Beeper #:  [phone]  06/02/2021 3:37 PM

## 2021-06-02 NOTE — Progress Notes (Signed)
PROGRESS NOTE    Paula Torres   NIO:[id number]  DOB: 1950/09/24  PCP: Rusty Aus, MD    DOA: 05/28/2021 LOS: 5    Brief Narrative / Hospital Course to Date:   70 year old female with past medical history of diastolic CHF, hyperlipidemia, COPD chronically on 3 L/min supplemental oxygen, GERD, depression, paroxysmal A. fib on Eliquis, pulmonary hypertension, OSA noncompliant with BiPAP (supposed to use BiPAP but does not tolerate), iron deficiency anemia, CKD stage IIIa who presented to the ED on 05/28/2021 with progressive leg pain with bleeding and oozing after a mechanical fall 3 days prior.  Evaluation in the ED revealed severe sepsis secondary to left lower extremity wound infection and cellulitis.  Admitted to [facility] service and started on empiric IV antibiotics pending cultures.  Patient also with signs of acute COPD exacerbation started on IV steroids and bronchodilators.  Assessment & Plan   Principal Problem:   Wound infection_left lower leg Active Problems:   Hyperlipidemia   COPD exacerbation (HCC)   Pulmonary hypertension (HCC)   Depression   Acute on chronic respiratory failure with hypoxia (HCC)   AF (paroxysmal atrial fibrillation) (HCC)   Severe sepsis (HCC)   Chronic diastolic CHF (congestive heart failure) (HCC)   Acute renal failure superimposed on stage 3a chronic kidney disease (HCC)   Hyponatremia   Hypokalemia   Iron deficiency anemia   Cellulitis of left lower leg   Severe sepsis secondary to left lower extremity cellulitis and wound infection and secondary acinetobacter bacteremia  -patient met criteria for severe sepsis on admission with leukocytosis, tachypnea, lactic acidosis consistent with organ dysfunction.  Treated per sepsis protocol in the ED. Blood cultures growing acinetobacter baumannii LLE doppler U/S negative for DVT --ID consulted Plan: --cont Unasyn --ortho consult today, per ID rec  Acute on chronic respiratory failure with  hypoxia and hypercapnia secondary to COPD with acute exacerbation  3L O2 at baseline Has advanced COPD and BiPAP recommended but patient states does not tolerate. 9/27 - pt more lethargic, ABG with pCO2 80 (up from 50).   Started on BiPAP (requires mittens to keep it on), transfer to stepdown. PCCM consulted. Plan: --transition from IV solumedrol to home prednisone 20 mg daily  --Continue bronchodilators --Continue supplemental O2 to keep sats between 88-92%, wean as tolerated --pulm consult, Dr. Lanney Gins to see (pt's outpatient pulm doctor)  Hyponatremia  -present with sodium 129, likely due to poor p.o. intake, dehydration.   --Hold Lasix, spironolactone, metolazone.   --cont salt tablet (new)  Paroxysmal A. Fib, with RVR --developed RVR night of 9/28 --cont bisoprolol at increased 5 mg BID --hold Eliquis due to wound bleeding  Chronic diastolic CHF -prior echo on 01/21/2020 with EF 60 to 65%.  Patient volume status difficult to assess on exam due to morbid obesity with chest x-ray was without any pulmonary edema.  Appears overall compensated. --Lasix held on admission due to worsened renal function and hyponatremia --Monitor volume status and resume diuretic when appropriate --Daily weights  AKI, POA, resolved on CKD stage IIIa  -creatinine appears 1.0-1.2.  Presented with creatinine 2.11 and BUN 64, likely due to dehydration with severe sepsis in addition to being on Lasix.  She was treated with IV fluids in the ED. --Hold lasix --encourage oral hydration  Pulmonary hypertension  -continue treprostinil  Depression  -continue Lexapro  Hyperlipidemia  -continue Lipitor  Acute on chronic iron deficiency anemia -hemoglobin in August was 10.3, presented with hemoglobin 7.9.  Suspect blood loss with wound  bleeding.   --1u pRBC on 9/27 for Hbg 6.8 Plan: --hold Eliquis --cont iron supplement --transfuse if Hgb <7  OSA  -does not tolerate BiPAP at home or here.  Pt asked  for help to tolerate her BiPAP --pulm consult, Dr. Lanney Gins to see (pt's outpatient pulm doctor)  Leukocytosis, persistent --maybe due to IV solumedrol.  Procal trended down to <0.1.  No fever.   --Monitor WBC   Morbid obesity: Body mass index is 48.24 kg/m.  Complicates overall care and prognosis.  Recommend lifestyle modifications including physical activity and diet for weight loss and overall long-term health.   DVT prophylaxis: SCD/Compression stockings Code Status: Full code  Family Communication:  Status is: inpatient Dispo:   The patient is from: home Anticipated d/c is to: SNF Anticipated d/c date is: 1-2 days for medical readniess  Patient currently is not medically stable to d/c due to: abx plan hasn't been finalized.    Subjective 06/02/21    Pt reported knee hurt when it's wrapped tight.  Breathing improved, O2 requirement down to 3L today.    Surgical consult obtained today per ID request.   Consults, Procedures, Significant Events   Consultants:  None  Procedures:  None  Antimicrobials:  Anti-infectives (From admission, onward)    Start     Dose/Rate Route Frequency Ordered Stop   05/29/21 1100  cefTRIAXone (ROCEPHIN) 2 g in sodium chloride 0.9 % 100 mL IVPB  Status:  Discontinued        2 g 200 mL/hr over 30 Minutes Intravenous Every 24 hours 05/28/21 1244 05/29/21 0248   05/29/21 1000  Ampicillin-Sulbactam (UNASYN) 3 g in sodium chloride 0.9 % 100 mL IVPB        3 g 200 mL/hr over 30 Minutes Intravenous Every 6 hours 05/29/21 0825     05/29/21 0400  Ampicillin-Sulbactam (UNASYN) 3 g in sodium chloride 0.9 % 100 mL IVPB  Status:  Discontinued        3 g 200 mL/hr over 30 Minutes Intravenous Every 12 hours 05/29/21 0251 05/29/21 0353   05/29/21 0400  Ampicillin-Sulbactam (UNASYN) 3 g in sodium chloride 0.9 % 100 mL IVPB  Status:  Discontinued        3 g 200 mL/hr over 30 Minutes Intravenous Every 8 hours 05/29/21 0353 05/29/21 0825   05/28/21  1309  vancomycin variable dose per unstable renal function (pharmacist dosing)  Status:  Discontinued         Does not apply See admin instructions 05/28/21 1309 05/29/21 0258   05/28/21 1200  vancomycin (VANCOREADY) IVPB 1500 mg/300 mL        1,500 mg 150 mL/hr over 120 Minutes Intravenous  Once 05/28/21 1102 05/28/21 1657   05/28/21 1100  vancomycin (VANCOCIN) IVPB 1000 mg/200 mL premix        1,000 mg 200 mL/hr over 60 Minutes Intravenous  Once 05/28/21 1056 05/28/21 1554   05/28/21 1100  cefTRIAXone (ROCEPHIN) 2 g in sodium chloride 0.9 % 100 mL IVPB        2 g 200 mL/hr over 30 Minutes Intravenous  Once 05/28/21 1056 05/28/21 1553         Micro    Objective   Vitals:   06/02/21 0822 06/02/21 1157 06/02/21 1357 06/02/21 1600  BP: 113/79 131/83  115/74  Pulse: 98 (!) 117 (!) 118 (!) 120  Resp: _0 Temp: 97.9 F (36.6 C) (!) 97.4 F (36.3 C)  98.1 F (36.7 C)  TempSrc: Oral Oral  Oral  SpO2: 94% 98% 97% 95%  Weight:      Height:        Intake/Output Summary (Last 24 hours) at 06/02/2021 1715 Last data filed at 06/02/2021 0107 Gross per 24 hour  Intake 1236.78 ml  Output --  Net 1236.78 ml   Filed Weights   05/31/21 0500 06/01/21 0410 06/02/21 0500  Weight: 132.4 kg (!) 136.2 kg 131.5 kg    Physical Exam:  Constitutional: NAD, AAOx3 HEENT: conjunctivae and lids normal, EOMI CV: No cyanosis.   RESP: normal respiratory effort, on 3L Extremities: Left leg wrapped, larger than right. SKIN: warm, dry  Psych: flat mood and affect.     Labs   Data Reviewed: I have personally reviewed following labs and imaging studies  CBC: Recent Labs  Lab 05/28/21 0922 05/28/21 1246 05/29/21 0658 05/29/21 1939 05/30/21 0501 05/30/21 1548 05/31/21 0538 06/01/21 0438 06/02/21 0110  WBC 28.2*  --  28.3*  --  23.7*  --  24.0* 23.5* 24.5*  NEUTROABS 24.5*  --   --   --   --   --   --   --   --   HGB 7.9*   < > 7.2*   < > 6.8* 8.3* 8.1* 8.2* 8.7*  HCT 24.2*    < > 22.4*   < > 21.6* 25.9* 26.0* 27.7* 29.1*  MCV 82.9  --  82.1  --  86.1  --  88.7 89.4 89.8  PLT 399  --  386  --  382  --  440* 449* 476*   < > = values in this interval not displayed.   Basic Metabolic Panel: Recent Labs  Lab 05/28/21 0922 05/29/21 0658 05/30/21 0501 05/31/21 0538 06/01/21 0438 06/02/21 0110  NA 129* 130* 135 136 138 137  K 3.1* 3.3* 3.5 3.6 4.1 4.5  CL 73* 77* 82* 85* 88* 90*  CO2 39* 38* 38* 40* 39* 36*  GLUCOSE 135* 181* 186* 245* 211* 229*  BUN 64* 57* 59* 51* 41* 44*  CREATININE 2.11* 1.60* 1.26* 1.19* 1.12* 1.25*  CALCIUM 9.1 9.0 9.0 9.2 9.2 8.9  MG 2.3  --  2.8* 2.8* 2.5* 2.4   GFR: Estimated Creatinine Clearance: 58.2 mL/min (A) (by C-G formula based on SCr of 1.25 mg/dL (H)). Liver Function Tests: Recent Labs  Lab 05/28/21 0922  AST 15  ALT 8  ALKPHOS 101  BILITOT 1.2  PROT 6.6  ALBUMIN 3.2*   No results for input(s): LIPASE, AMYLASE in the last 168 hours. No results for input(s): AMMONIA in the last 168 hours. Coagulation Profile: No results for input(s): INR, PROTIME in the last 168 hours. Cardiac Enzymes: No results for input(s): CKTOTAL, CKMB, CKMBINDEX, TROPONINI in the last 168 hours. BNP (last 3 results) No results for input(s): PROBNP in the last 8760 hours. HbA1C: No results for input(s): HGBA1C in the last 72 hours. CBG: No results for input(s): GLUCAP in the last 168 hours. Lipid Profile: No results for input(s): CHOL, HDL, LDLCALC, TRIG, CHOLHDL, LDLDIRECT in the last 72 hours. Thyroid Function Tests: No results for input(s): TSH, T4TOTAL, FREET4, T3FREE, THYROIDAB in the last 72 hours. Anemia Panel: No results for input(s): VITAMINB12, FOLATE, FERRITIN, TIBC, IRON, RETICCTPCT in the last 72 hours.  Sepsis Labs: Recent Labs  Lab 05/28/21 0922 05/28/21 1056 06/02/21 0110  PROCALCITON 0.36  --  <0.10  LATICACIDVEN 2.5* 1.7  --     Recent Results (from the past 240 hour(s))  Blood culture (routine x 2)      Status: Abnormal   Collection Time: 05/28/21 10:56 AM   Specimen: BLOOD  Result Value Ref Range Status   Specimen Description   Final    BLOOD RIGHT ANTECUBITAL Performed at [facility], [address], [place], [place] [address]    Special Requests   Final    BOTTLES DRAWN AEROBIC AND ANAEROBIC Blood Culture adequate volume Performed at [facility], [address] [place], [place] [address]    Culture  Setup Time   Final    GRAM NEGATIVE COCCOBACILLI AEROBIC BOTTLE ONLY CRITICAL VALUE NOTED.  VALUE IS CONSISTENT WITH PREVIOUSLY REPORTED AND CALLED VALUE.    Culture (A)  Final    ACINETOBACTER CALCOACETICUS/BAUMANNII COMPLEX SUSCEPTIBILITIES PERFORMED ON PREVIOUS CULTURE WITHIN THE LAST 5 DAYS. Performed at [facility], [place] [address], [place], [place] [address]    Report Status 05/31/2021 FINAL  Final  Blood culture (routine x 2)     Status: Abnormal   Collection Time: 05/28/21 10:57 AM   Specimen: BLOOD  Result Value Ref Range Status   Specimen Description   Final    BLOOD BLOOD LEFT FOREARM Performed at [facility], [address] [place], [place] [address]    Special Requests   Final    BOTTLES DRAWN AEROBIC AND ANAEROBIC Blood Culture results may not be optimal due to an inadequate volume of blood received in culture bottles Performed at [facility], [address] [place], [place] [address]    Culture  Setup Time   Final    Organism ID to follow GRAM NEGATIVE COCCOBACILLI IN BOTH AEROBIC AND ANAEROBIC BOTTLES CRITICAL RESULT CALLED TO, READ BACK BY AND VERIFIED WITH: AUSTIN REEVES @ 2327 05/28/21 LFD Performed at [facility], [address] [place], [place] [address]    Culture ACINETOBACTER CALCOACETICUS/BAUMANNII COMPLEX (A)  Final   Report Status 05/31/2021 FINAL  Final   Organism ID, Bacteria ACINETOBACTER CALCOACETICUS/BAUMANNII COMPLEX  Final      Susceptibility   Acinetobacter  calcoaceticus/baumannii complex - MIC*    CEFTAZIDIME 4 SENSITIVE Sensitive     CIPROFLOXACIN >=4 RESISTANT Resistant     GENTAMICIN 2 SENSITIVE Sensitive     IMIPENEM <=0.25 SENSITIVE Sensitive     PIP/TAZO <=4 SENSITIVE Sensitive     TRIMETH/SULFA <=20 SENSITIVE Sensitive     AMPICILLIN/SULBACTAM <=2 SENSITIVE Sensitive     * ACINETOBACTER CALCOACETICUS/BAUMANNII COMPLEX  Blood Culture ID Panel (Reflexed)     Status: Abnormal (Preliminary result)   Collection Time: 05/28/21 10:57 AM  Result Value Ref Range Status   Enterococcus faecalis NOT DETECTED NOT DETECTED Final   Enterococcus Faecium NOT DETECTED NOT DETECTED Final   Listeria monocytogenes NOT DETECTED NOT DETECTED Final   Staphylococcus species NOT DETECTED NOT DETECTED Final   Staphylococcus aureus (BCID) PENDING NOT DETECTED Incomplete   Staphylococcus epidermidis NOT DETECTED NOT DETECTED Final   Staphylococcus lugdunensis NOT DETECTED NOT DETECTED Final   Streptococcus species NOT DETECTED NOT DETECTED Final   Streptococcus agalactiae NOT DETECTED NOT DETECTED Final   Streptococcus pneumoniae NOT DETECTED NOT DETECTED Final   Streptococcus pyogenes NOT DETECTED NOT DETECTED Final   A.calcoaceticus-baumannii DETECTED (A) NOT DETECTED Final    Comment: CRITICAL RESULT CALLED TO, READ BACK BY AND VERIFIED WITH: AUSTIN REEVES @ 2327 05/28/21 LFD    Bacteroides fragilis NOT DETECTED NOT DETECTED Final   Enterobacterales NOT DETECTED NOT DETECTED Final   Enterobacter cloacae complex  NOT DETECTED NOT DETECTED Final   Escherichia coli NOT DETECTED NOT DETECTED Final   Klebsiella aerogenes NOT DETECTED NOT DETECTED Final   Klebsiella oxytoca NOT DETECTED NOT DETECTED Final   Klebsiella pneumoniae NOT DETECTED NOT DETECTED Final   Proteus species NOT DETECTED NOT DETECTED Final   Salmonella species NOT DETECTED NOT DETECTED Final   Serratia marcescens NOT DETECTED NOT DETECTED Final   Haemophilus influenzae NOT DETECTED NOT  DETECTED Final   Neisseria meningitidis NOT DETECTED NOT DETECTED Final   Pseudomonas aeruginosa NOT DETECTED NOT DETECTED Final   Stenotrophomonas maltophilia NOT DETECTED NOT DETECTED Final   Candida albicans NOT DETECTED NOT DETECTED Final   Candida auris NOT DETECTED NOT DETECTED Final   Candida glabrata NOT DETECTED NOT DETECTED Final   Candida krusei NOT DETECTED NOT DETECTED Final   Candida parapsilosis NOT DETECTED NOT DETECTED Final   Candida tropicalis NOT DETECTED NOT DETECTED Final   Cryptococcus neoformans/gattii NOT DETECTED NOT DETECTED Final   CTX-M ESBL NOT DETECTED NOT DETECTED Final   Carbapenem resistance IMP NOT DETECTED NOT DETECTED Final   Carbapenem resistance KPC NOT DETECTED NOT DETECTED Final   Carbapenem resistance NDM NOT DETECTED NOT DETECTED Final   Carbapenem resistance VIM NOT DETECTED NOT DETECTED Final    Comment: Performed at [facility], [address] [place], [place] [address]  Resp Panel by RT-PCR (Flu A&B, Covid) Nasopharyngeal Swab     Status: None   Collection Time: 05/28/21 12:46 PM   Specimen: Nasopharyngeal Swab; Nasopharyngeal(NP) swabs in vial transport medium  Result Value Ref Range Status   SARS Coronavirus 2 by RT PCR NEGATIVE NEGATIVE Final    Comment: (NOTE) SARS-CoV-2 target nucleic acids are NOT DETECTED.  The SARS-CoV-2 RNA is generally detectable in upper respiratory specimens during the acute phase of infection. The lowest concentration of SARS-CoV-2 viral copies this assay can detect is 138 copies/mL. A negative result does not preclude SARS-Cov-2 infection and should not be used as the sole basis for treatment or other patient management decisions. A negative result may occur with  improper specimen collection/handling, submission of specimen other than nasopharyngeal swab, presence of viral mutation(s) within the areas targeted by this assay, and inadequate number of viral copies(<138 copies/mL). A negative  result must be combined with clinical observations, patient history, and epidemiological information. The expected result is Negative.  Fact Sheet for Patients:  EntrepreneurPulse.com.au  Fact Sheet for Healthcare Providers:  IncredibleEmployment.be  This test is no t yet approved or cleared by the [place] FDA and  has been authorized for detection and/or diagnosis of SARS-CoV-2 by FDA under an Emergency Use Authorization (EUA). This EUA will remain  in effect (meaning this test can be used) for the duration of the COVID-19 declaration under Section 564(b)(1) of the Act, 21 U.S.C.section 360bbb-3(b)(1), unless the authorization is terminated  or revoked sooner.       Influenza A by PCR NEGATIVE NEGATIVE Final   Influenza B by PCR NEGATIVE NEGATIVE Final    Comment: (NOTE) The Xpert Xpress SARS-CoV-2/FLU/RSV plus assay is intended as an aid in the diagnosis of influenza from Nasopharyngeal swab specimens and should not be used as a sole basis for treatment. Nasal washings and aspirates are unacceptable for Xpert Xpress SARS-CoV-2/FLU/RSV testing.  Fact Sheet for Patients: EntrepreneurPulse.com.au  Fact Sheet for Healthcare Providers: IncredibleEmployment.be  This test is not yet approved or cleared by the [place] FDA and has been authorized for detection and/or diagnosis of SARS-CoV-2 by  FDA under an Emergency Use Authorization (EUA). This EUA will remain in effect (meaning this test can be used) for the duration of the COVID-19 declaration under Section 564(b)(1) of the Act, 21 U.S.C. section 360bbb-3(b)(1), unless the authorization is terminated or revoked.  Performed at [facility], [address] [place], [place] [address]   CULTURE, BLOOD (ROUTINE X 2) w Reflex to ID Panel     Status: None (Preliminary result)   Collection Time: 06/02/21 12:59 AM   Specimen: BLOOD  Result  Value Ref Range Status   Specimen Description BLOOD RIGHT HAND  Final   Special Requests   Final    BOTTLES DRAWN AEROBIC AND ANAEROBIC Blood Culture adequate volume   Culture   Final    NO GROWTH < 12 HOURS Performed at [facility], [address] [place], [place] [address]    Report Status PENDING  Incomplete  CULTURE, BLOOD (ROUTINE X 2) w Reflex to ID Panel     Status: None (Preliminary result)   Collection Time: 06/02/21  1:10 AM   Specimen: BLOOD  Result Value Ref Range Status   Specimen Description BLOOD RIGHT ASSIST CONTROL  Final   Special Requests   Final    BOTTLES DRAWN AEROBIC AND ANAEROBIC Blood Culture adequate volume   Culture   Final    NO GROWTH < 12 HOURS Performed at [facility], [address] [place], [place] [address]    Report Status PENDING  Incomplete      Imaging Studies   No results found.   Medications   Scheduled Meds:  atorvastatin  20 mg Oral QHS   bisoprolol  5 mg Oral BID   budesonide (PULMICORT) nebulizer solution  0.5 mg Nebulization BID   collagenase   Topical Daily   escitalopram  20 mg Oral Daily   famotidine  20 mg Oral QHS   ferrous gluconate  324 mg Oral Weekly   ipratropium-albuterol  3 mL Nebulization Q6H   loratadine  10 mg Oral QHS   mometasone-formoterol  2 puff Inhalation BID   And   umeclidinium bromide  1 puff Inhalation Daily   montelukast  10 mg Oral QHS   predniSONE  20 mg Oral Q breakfast   sodium chloride  2 g Oral BID WC   Continuous Infusions:  sodium chloride Stopped (05/28/21 1600)   ampicillin-sulbactam (UNASYN) IV 3 g (06/02/21 1452)       LOS: 5 days     Enzo Bi, md [facility]  06/02/2021, 5:15 PM

## 2021-06-02 NOTE — Consult Note (Signed)
Pharmacy Antibiotic Note  Paula Torres is a 70 y.o. female admitted on 05/28/2021 with purulent lower extremity wound s/p mechanical fall 9/22.  Pharmacy has been consulted for Unasyn dosing  Pt Transitioned to Unasyn based on BCID results:  GRAM NEGATIVE COCCOBACILLI: likely A.calcoaceticus-baumannii per BCID  Antibiotics Day 6. Infectious disease is following.  Scr 2.11 >> 1.25 improved (baseline ~ 1.2)   Plan: --Continue Unasyn 3g Q6 hours --Pharmacy will continue to monitor and will adjust dose if warranted.     Height: _0  (165.1 cm) Weight: 131.5 kg (289 lb 14.5 oz) IBW/kg (Calculated) : 57  Temp (24hrs), Avg:97.9 F (36.6 C), Min:97.4 F (36.3 C), Max:98.5 F (36.9 C)  Recent Labs  Lab 05/28/21 0922 05/28/21 1056 05/29/21 0658 05/30/21 0501 05/31/21 0538 06/01/21 0438 06/02/21 0110  WBC 28.2*  --  28.3* 23.7* 24.0* 23.5* 24.5*  CREATININE 2.11*  --  1.60* 1.26* 1.19* 1.12* 1.25*  LATICACIDVEN 2.5* 1.7  --   --   --   --   --      Estimated Creatinine Clearance: 58.2 mL/min (A) (by C-G formula based on SCr of 1.25 mg/dL (H)).    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  Pearla Dubonnet, PharmD Clinical Pharmacist 06/02/2021 1:49 PM

## 2021-06-02 NOTE — Progress Notes (Signed)
OT Cancellation Note  Patient Details Name: Paula Torres MRN: [id number] DOB: Dec 22, 1950   Cancelled Treatment:    Reason Eval/Treat Not Completed: Patient declined, no reason specified. Upon attempt, pt declining OT tx, stating "I'm comfy." Pt educated in importance of activity in minimizing decline in strength and activity tolerance. Pt verbalized understanding and again declined even bed level ADL tasks. Will re-attempt at later date/time as schedule permits.   Ardeth Perfect., MPH, MS, OTR/L ascom [phone] 06/02/21, 9:54 AM

## 2021-06-02 NOTE — TOC Progression Note (Signed)
Transition of Care [facility]) - Progression Note    Patient Details  Name: Paula Torres MRN: [id number] Date of Birth: Mar 15, 1951  Transition of [facility]) CM/SW Contact  Shelbie Hutching, RN Phone Number: 06/02/2021, 2:33 PM  Clinical Narrative:    RNCM met with patient at the bedside to discuss PT recommendations and discharge needs.  Patient's husband is a resident of [facility] but patient reports that they are trying to get rid of him so he may have to return home, she prefers not to go for rehab at [facility].  She is open to the idea of going to another facility in the area for short term rehab.  Bed search started.     Expected Discharge Plan: [place] Barriers to Discharge: Continued Medical Work up  Expected Discharge Plan and Services Expected Discharge Plan: [place]   Discharge Planning Services: CM Consult Post Acute Care Choice: [place] Living arrangements for the past 2 months: Single Family Home                 DME Arranged: N/A DME Agency: NA       HH Arranged: NA           Social Determinants of Health (SDOH) Interventions    Readmission Risk Interventions No flowsheet data found.

## 2021-06-02 NOTE — Progress Notes (Signed)
06/02/21 1927 06/02/21 2000  Assess: MEWS Score  Temp 98.2 F (36.8 C)  --   BP 111/73  --   Pulse Rate (!) 126  --   Resp 19  --   Level of Consciousness  --  Alert  SpO2 96 %  --   O2 Device Nasal Cannula Nasal Cannula  O2 Flow Rate (L/min) 3 L/min 3 L/min  Assess: MEWS Score  MEWS Temp 0 0  MEWS Systolic 0 0  MEWS Pulse 2 2  MEWS RR 0 0  MEWS LOC 0 0  MEWS Score 2 2  MEWS Score Color Yellow Yellow  Treat  Pain Scale  --  0-10  Pain Score  --  0  Complains of  --  Other (Comment) (denies anxiousness)  Notify: Provider  Provider Name/Title  --  Dr Sharion Settler  Date Provider Notified  --  06/02/21  Time Provider Notified  --  2022  Notification Type  --   (Secure Chat)  Notification Reason  --  Other (Comment) (High Heart Rate)  Provider response  --  Other (Comment) (awaiting orders)  Date of Provider Response  --  06/02/21  Time of Provider Response  --  2025  Assess: SIRS CRITERIA  SIRS Temperature  0  --   SIRS Pulse 1  --   SIRS Respirations  0  --   SIRS WBC 0  --   SIRS Score Sum  1  --    Dr Randol Kern instructed to scheduled Zebeta be given now.

## 2021-06-02 NOTE — NC FL2 (Signed)
[place] LEVEL OF CARE SCREENING TOOL     IDENTIFICATION  Patient Name: Paula Torres Birthdate: [birth year] Sex: female Admission Date (Current Location): 05/28/2021  [place] and [place] Number:  Engineering geologist and Address:  [facility], [address] [place], [place] [address]      Provider Number: [id number]  Attending Physician Name and Address:  Enzo Bi, MD  Relative Name and Phone Number:  Syra Sirmons (daughter) [phone]    Current Level of Care: Hospital Recommended Level of Care: [place] Prior Approval Number:    Date Approved/Denied:   PASRR Number: pending  Discharge Plan: SNF    Current Diagnoses: Patient Active Problem List   Diagnosis Date Noted   Severe sepsis ([place]) 05/28/2021   Wound infection_left lower leg 05/28/2021   COPD (chronic obstructive pulmonary disease) ([place]) 05/28/2021   Chronic diastolic CHF (congestive heart failure) ([place]) 05/28/2021   Acute renal failure superimposed on stage 3a chronic kidney disease ([place]) 05/28/2021   Hyponatremia 05/28/2021   Hypokalemia 05/28/2021   Iron deficiency anemia 05/28/2021   Cellulitis of left lower leg 05/28/2021   Acute on chronic respiratory failure ([place]) 04/25/2021   Acute on chronic respiratory failure with hypoxia ([place]) 05/11/2020   AF (paroxysmal atrial fibrillation) ([place]) 05/11/2020   Acute on chronic diastolic congestive heart failure (HCC)    Acute respiratory distress    Pulmonary hypertension ([place])    Restless leg syndrome    Depression    COPD exacerbation ([place]) 05/09/2020   Atrial fibrillation with RVR ([place]) 01/21/2020   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[place]) 01/21/2020   Abnormal cardiovascular stress test    Demand ischemia (HCC)    Hyperlipidemia    COPD with acute exacerbation ([place]) 11/08/2019   Acute on chronic diastolic CHF (congestive heart failure) ([place]) 11/08/2019   Acute respiratory  failure with hypoxia and hypercapnia (HCC) 11/08/2019   Obesity, Class III, BMI 40-49.9 (morbid obesity) ([place]) 11/08/2019   Elevated troponin 11/08/2019    Orientation RESPIRATION BLADDER Height & Weight     Self, Time, Situation, Place  O2 ([place] 3 L Chronic) Continent, External catheter Weight: 131.5 kg Height:  _0  (165.1 cm)  BEHAVIORAL SYMPTOMS/MOOD NEUROLOGICAL BOWEL NUTRITION STATUS      Continent Diet (see discharge summary)  AMBULATORY STATUS COMMUNICATION OF NEEDS Skin   Limited Assist Verbally Bruising, Other (Comment) (left keg wound to knee after fall- bruising to entire leg- see wound care orders)                       Personal Care Assistance Level of Assistance  Bathing, Feeding, Dressing Bathing Assistance: Limited assistance Feeding assistance: Limited assistance Dressing Assistance: Limited assistance     Functional Limitations Info  Sight, Hearing, Speech Sight Info: Adequate Hearing Info: Adequate Speech Info: Adequate    SPECIAL CARE FACTORS FREQUENCY  PT (By licensed PT), OT (By licensed OT)     PT Frequency: 5 days per week OT Frequency: 5 days per week            Contractures Contractures Info: Not present    Additional Factors Info  Code Status, Allergies Code Status Info: Full Allergies Info: Codeine, sulfa           Current Medications (06/02/2021):  This is the current hospital active medication list Current Facility-Administered Medications  Medication Dose Route Frequency Provider Last Rate Last Admin   0.9 %  sodium chloride infusion  Intravenous PRN Ivor Costa, MD   Stopped at 05/28/21 1600   acetaminophen (TYLENOL) tablet 650 mg  650 mg Oral Q6H PRN Ivor Costa, MD       albuterol (PROVENTIL) (2.5 MG/3ML) 0.083% nebulizer solution 2.5 mg  2.5 mg Nebulization Q4H PRN Ivor Costa, MD       Ampicillin-Sulbactam (UNASYN) 3 g in sodium chloride 0.9 % 100 mL IVPB  3 g Intravenous Q6H Chinita Greenland A, RPH 200 mL/hr at 06/02/21  1452 3 g at 06/02/21 1452   atorvastatin (LIPITOR) tablet 20 mg  20 mg Oral QHS Ivor Costa, MD   20 mg at 06/01/21 2111   bisoprolol (ZEBETA) tablet 5 mg  5 mg Oral BID Enzo Bi, MD   5 mg at 06/02/21 1447   budesonide (PULMICORT) nebulizer solution 0.5 mg  0.5 mg Nebulization BID Flora Lipps, MD   0.5 mg at 06/02/21 6104   collagenase (SANTYL) ointment   Topical Daily Ivor Costa, MD   Given at 06/02/21 1312   dextromethorphan-guaiFENesin ([place] DM) 30-600 MG per 12 hr tablet 1 tablet  1 tablet Oral BID PRN Ivor Costa, MD       docusate sodium (COLACE) capsule 100 mg  100 mg Oral Daily PRN Ivor Costa, MD       escitalopram (LEXAPRO) tablet 20 mg  20 mg Oral Daily Ivor Costa, MD   20 mg at 06/02/21 1329   famotidine (PEPCID) tablet 20 mg  20 mg Oral QHS Ivor Costa, MD   20 mg at 06/01/21 2111   ferrous gluconate (FERGON) tablet 324 mg  324 mg Oral Weekly Ivor Costa, MD   324 mg at 05/29/21 1328   hydrALAZINE (APRESOLINE) injection 5 mg  5 mg Intravenous Q2H PRN Ivor Costa, MD       ipratropium-albuterol (DUONEB) 0.5-2.5 (3) MG/3ML nebulizer solution 3 mL  3 mL Nebulization Q6H Enzo Bi, MD   3 mL at 06/02/21 1357   loratadine (CLARITIN) tablet 10 mg  10 mg Oral QHS Ivor Costa, MD   10 mg at 06/01/21 2111   LORazepam (ATIVAN) tablet 0.5 mg  0.5 mg Oral Q4H PRN Mansy, Jan A, MD       mometasone-formoterol [facility]) 100-5 MCG/ACT inhaler 2 puff  2 puff Inhalation BID Nicole Kindred A, DO   2 puff at 06/01/21 2113   And   umeclidinium bromide (INCRUSE ELLIPTA) 62.5 MCG/INH 1 puff  1 puff Inhalation Daily Nicole Kindred A, DO   1 puff at 06/02/21 1333   montelukast (SINGULAIR) tablet 10 mg  10 mg Oral QHS Ivor Costa, MD   10 mg at 06/01/21 2113   ondansetron (ZOFRAN) injection 4 mg  4 mg Intravenous Q8H PRN Ivor Costa, MD       oxyCODONE-acetaminophen (PERCOCET/ROXICET) 5-325 MG per tablet 1 tablet  1 tablet Oral Q4H PRN Ivor Costa, MD   1 tablet at 06/01/21 2243   predniSONE (DELTASONE) tablet  20 mg  20 mg Oral Q breakfast Enzo Bi, MD   20 mg at 06/02/21 2473   sodium chloride tablet 2 g  2 g Oral BID WC Ivor Costa, MD   2 g at 06/02/21 1924     Discharge Medications: Please see discharge summary for a list of discharge medications.  Relevant Imaging Results:  Relevant Lab Results:   Additional Information SS# [id number]  Shelbie Hutching, RN

## 2021-06-02 NOTE — Progress Notes (Signed)
To whom it may concern:  The above named patient will require a short term nursing home stay- 30 days or less for strengthening and rehabilitation.  Plan is for patient to return home.

## 2021-06-03 DIAGNOSIS — L089 Local infection of the skin and subcutaneous tissue, unspecified: Secondary | ICD-10-CM | POA: Diagnosis not present

## 2021-06-03 DIAGNOSIS — T148XXA Other injury of unspecified body region, initial encounter: Secondary | ICD-10-CM | POA: Diagnosis not present

## 2021-06-03 LAB — BASIC METABOLIC PANEL
Anion gap: 9 (ref 5–15)
BUN: 40 mg/dL — ABNORMAL HIGH (ref 8–23)
CO2: 39 mmol/L — ABNORMAL HIGH (ref 22–32)
Calcium: 9 mg/dL (ref 8.9–10.3)
Chloride: 92 mmol/L — ABNORMAL LOW (ref 98–111)
Creatinine, Ser: 1.14 mg/dL — ABNORMAL HIGH (ref 0.44–1.00)
GFR, Estimated: 52 mL/min — ABNORMAL LOW (ref >60)
Glucose, Bld: 143 mg/dL — ABNORMAL HIGH (ref 70–99)
Potassium: 4.3 mmol/L (ref 3.5–5.1)
Sodium: 140 mmol/L (ref 135–145)

## 2021-06-03 LAB — CBC
HCT: 31.1 % — ABNORMAL LOW (ref 36.0–46.0)
Hemoglobin: 9.6 g/dL — ABNORMAL LOW (ref 12.0–15.0)
MCH: 28.1 pg (ref 26.0–34.0)
MCHC: 30.9 g/dL (ref 30.0–36.0)
MCV: 90.9 fL (ref 80.0–100.0)
Platelets: 479 10*3/uL — ABNORMAL HIGH (ref 150–400)
RBC: 3.42 MIL/uL — ABNORMAL LOW (ref 3.87–5.11)
RDW: 17.3 % — ABNORMAL HIGH (ref 11.5–15.5)
WBC: 26.2 10*3/uL — ABNORMAL HIGH (ref 4.0–10.5)
nRBC: 0.2 % (ref 0.0–0.2)

## 2021-06-03 LAB — MAGNESIUM: Magnesium: 2.4 mg/dL (ref 1.7–2.4)

## 2021-06-03 MED ORDER — IPRATROPIUM-ALBUTEROL 0.5-2.5 (3) MG/3ML IN SOLN
3.0000 mL | Freq: Three times a day (TID) | RESPIRATORY_TRACT | Status: DC
  Administered 2021-06-03 – 2021-06-04 (×4): 3 mL via RESPIRATORY_TRACT
  Filled 2021-06-03 (×6): qty 3

## 2021-06-03 MED ORDER — FUROSEMIDE 10 MG/ML IJ SOLN
40.0000 mg | Freq: Once | INTRAMUSCULAR | Status: AC
  Administered 2021-06-03: 40 mg via INTRAVENOUS
  Filled 2021-06-03: qty 4

## 2021-06-03 MED ORDER — BISOPROLOL FUMARATE 5 MG PO TABS
10.0000 mg | ORAL_TABLET | Freq: Two times a day (BID) | ORAL | Status: DC
  Administered 2021-06-03 – 2021-06-08 (×6): 10 mg via ORAL
  Filled 2021-06-03 (×13): qty 2

## 2021-06-03 NOTE — Progress Notes (Signed)
0927 - pt lethargic, unable to take medications, notified Lai MD.  1115 - respiratory called with critical value of ABG 7.41/68.  Billie Ruddy MD aware.  Pt placed on bipap.  Pt has known hx of noncompliance with bipap.  Initial plan to transfer to higher level of care, however pt not to be transferred d/t noncompliance with bipap.

## 2021-06-03 NOTE — Progress Notes (Signed)
PROGRESS NOTE    Paula Torres   BSJ:[id number]  DOB: July 19, 1951  PCP: Rusty Aus, MD    DOA: 05/28/2021 LOS: 6    Brief Narrative / Hospital Course to Date:   70 year old female with past medical history of diastolic CHF, hyperlipidemia, COPD chronically on 3 L/min supplemental oxygen, GERD, depression, paroxysmal A. fib on Eliquis, pulmonary hypertension, OSA noncompliant with BiPAP (supposed to use BiPAP but does not tolerate), iron deficiency anemia, CKD stage IIIa who presented to the ED on 05/28/2021 with progressive leg pain with bleeding and oozing after a mechanical fall 3 days prior.  Evaluation in the ED revealed severe sepsis secondary to left lower extremity wound infection and cellulitis.  Admitted to [facility] service and started on empiric IV antibiotics pending cultures.  Patient also with signs of acute COPD exacerbation started on IV steroids and bronchodilators.  Assessment & Plan   Principal Problem:   Wound infection_left lower leg Active Problems:   Hyperlipidemia   COPD exacerbation (HCC)   Pulmonary hypertension (HCC)   Depression   Acute on chronic respiratory failure with hypoxia (HCC)   AF (paroxysmal atrial fibrillation) (HCC)   Severe sepsis (HCC)   Chronic diastolic CHF (congestive heart failure) (HCC)   Acute renal failure superimposed on stage 3a chronic kidney disease (HCC)   Hyponatremia   Hypokalemia   Iron deficiency anemia   Cellulitis of left lower leg   Severe sepsis secondary to left lower extremity cellulitis and wound infection and secondary acinetobacter bacteremia  -patient met criteria for severe sepsis on admission with leukocytosis, tachypnea, lactic acidosis consistent with organ dysfunction.  Treated per sepsis protocol in the ED. Blood cultures growing acinetobacter baumannii LLE doppler U/S negative for DVT --ID consulted --ortho consulted on 9/30, rec local wound care Plan: --cont Unasyn --wound care per order  Acute  on chronic respiratory failure with hypoxia and hypercapnia secondary to COPD with acute exacerbation  3L O2 at baseline Has advanced COPD and BiPAP recommended but patient states does not tolerate. 9/27 - pt more lethargic, ABG with pCO2 80 (up from 50).   Started on BiPAP (requires mittens to keep it on), transfer to stepdown. PCCM consulted. --transitioned from IV solumedrol to home prednisone 20 mg daily on 9/30 Plan: --cont home prednisone 20 mg daily --Continue bronchodilators --Continue supplemental O2 to keep sats between 88-92%, wean as tolerated --pulm consult, Dr. Lanney Gins to see (pt's outpatient pulm doctor)  Hyponatremia  -present with sodium 129, likely due to poor p.o. intake, dehydration.   --started on salt tablet --d/c salt tablet today  Paroxysmal A. Fib, with RVR --developed RVR night of 9/28 --increase bisoprolol to 10 mg BID --hold Eliquis due to wound bleeding  Chronic diastolic CHF -prior echo on 01/21/2020 with EF 60 to 65%.  Patient volume status difficult to assess on exam due to morbid obesity with chest x-ray was without any pulmonary edema.  Appears overall compensated. --Lasix held on admission due to worsened renal function and hyponatremia Plan: --IV lasix 40 mg x1 today  AKI, POA, resolved on CKD stage IIIa  -creatinine appears 1.0-1.2.  Presented with creatinine 2.11 and BUN 64, likely due to dehydration with severe sepsis in addition to being on Lasix.  She was treated with IV fluids in the ED.  Pulmonary hypertension  -continue treprostinil  Depression  -continue Lexapro  Hyperlipidemia  -continue Lipitor  Acute on chronic iron deficiency anemia -hemoglobin in August was 10.3, presented with hemoglobin 7.9.  Suspect blood loss  with wound bleeding.   --1u pRBC on 9/27 for Hbg 6.8 Plan: --hold Eliquis --cont iron supplement --transfuse if Hgb <7  OSA  -does not tolerate BiPAP at home or here.  Pt asked for help to tolerate her  BiPAP --pulm consult, Dr. Lanney Gins to see (pt's outpatient pulm doctor) --BiPAP nightly  Leukocytosis, persistent --maybe due to IV solumedrol.  Procal trended down to <0.1.  No fever.   --Monitor WBC   Morbid obesity: Body mass index is 48.24 kg/m.  Complicates overall care and prognosis.  Recommend lifestyle modifications including physical activity and diet for weight loss and overall long-term health.   DVT prophylaxis: SCD/Compression stockings Code Status: Full code  Family Communication:  Status is: inpatient Dispo:   The patient is from: home Anticipated d/c is to: SNF Anticipated d/c date is: 1-2 days for medical readniess  Patient currently is not medically stable to d/c due to: abx plan hasn't been finalized.    Subjective 06/03/21    RT reported pt refusing BiPAP last night again.  This morning pt was found to be somnolent, and placed back on BiPAP.   Consults, Procedures, Significant Events   Consultants:  None  Procedures:  None  Antimicrobials:  Anti-infectives (From admission, onward)    Start     Dose/Rate Route Frequency Ordered Stop   05/29/21 1100  cefTRIAXone (ROCEPHIN) 2 g in sodium chloride 0.9 % 100 mL IVPB  Status:  Discontinued        2 g 200 mL/hr over 30 Minutes Intravenous Every 24 hours 05/28/21 1244 05/29/21 0248   05/29/21 1000  Ampicillin-Sulbactam (UNASYN) 3 g in sodium chloride 0.9 % 100 mL IVPB        3 g 200 mL/hr over 30 Minutes Intravenous Every 6 hours 05/29/21 0825     05/29/21 0400  Ampicillin-Sulbactam (UNASYN) 3 g in sodium chloride 0.9 % 100 mL IVPB  Status:  Discontinued        3 g 200 mL/hr over 30 Minutes Intravenous Every 12 hours 05/29/21 0251 05/29/21 0353   05/29/21 0400  Ampicillin-Sulbactam (UNASYN) 3 g in sodium chloride 0.9 % 100 mL IVPB  Status:  Discontinued        3 g 200 mL/hr over 30 Minutes Intravenous Every 8 hours 05/29/21 0353 05/29/21 0825   05/28/21 1309  vancomycin variable dose per unstable  renal function (pharmacist dosing)  Status:  Discontinued         Does not apply See admin instructions 05/28/21 1309 05/29/21 0258   05/28/21 1200  vancomycin (VANCOREADY) IVPB 1500 mg/300 mL        1,500 mg 150 mL/hr over 120 Minutes Intravenous  Once 05/28/21 1102 05/28/21 1657   05/28/21 1100  vancomycin (VANCOCIN) IVPB 1000 mg/200 mL premix        1,000 mg 200 mL/hr over 60 Minutes Intravenous  Once 05/28/21 1056 05/28/21 1554   05/28/21 1100  cefTRIAXone (ROCEPHIN) 2 g in sodium chloride 0.9 % 100 mL IVPB        2 g 200 mL/hr over 30 Minutes Intravenous  Once 05/28/21 1056 05/28/21 1553         Micro    Objective   Vitals:   06/03/21 0600 06/03/21 0829 06/03/21 1115 06/03/21 1313  BP: 117/78 123/61    Pulse: (!) 104 85 99   Resp: _0 Temp: 97.8 F (36.6 C) 97.8 F (36.6 C)    TempSrc: Oral Oral  SpO2: 96% 91% 95% 96%  Weight:      Height:       No intake or output data in the 24 hours ending 06/03/21 1509  Filed Weights   05/31/21 0500 06/01/21 0410 06/02/21 0500  Weight: 132.4 kg (!) 136.2 kg 131.5 kg    Physical Exam:  Constitutional: NAD, somnolent CV: No cyanosis.   RESP: normal respiratory effort, on BiPAP Extremities: BLE swelling, L>R SKIN: warm, dry, large eschar over left knee, covered by bandage   Labs   Data Reviewed: I have personally reviewed following labs and imaging studies  CBC: Recent Labs  Lab 05/28/21 0922 05/28/21 1246 05/30/21 0501 05/30/21 1548 05/31/21 0538 06/01/21 0438 06/02/21 0110 06/03/21 0505  WBC 28.2*   < > 23.7*  --  24.0* 23.5* 24.5* 26.2*  NEUTROABS 24.5*  --   --   --   --   --   --   --   HGB 7.9*   < > 6.8* 8.3* 8.1* 8.2* 8.7* 9.6*  HCT 24.2*   < > 21.6* 25.9* 26.0* 27.7* 29.1* 31.1*  MCV 82.9   < > 86.1  --  88.7 89.4 89.8 90.9  PLT 399   < > 382  --  440* 449* 476* 479*   < > = values in this interval not displayed.   Basic Metabolic Panel: Recent Labs  Lab 05/30/21 0501 05/31/21 0538  06/01/21 0438 06/02/21 0110 06/03/21 0505  NA 135 136 138 137 140  K 3.5 3.6 4.1 4.5 4.3  CL 82* 85* 88* 90* 92*  CO2 38* 40* 39* 36* 39*  GLUCOSE 186* 245* 211* 229* 143*  BUN 59* 51* 41* 44* 40*  CREATININE 1.26* 1.19* 1.12* 1.25* 1.14*  CALCIUM 9.0 9.2 9.2 8.9 9.0  MG 2.8* 2.8* 2.5* 2.4 2.4   GFR: Estimated Creatinine Clearance: 63.8 mL/min (A) (by C-G formula based on SCr of 1.14 mg/dL (H)). Liver Function Tests: Recent Labs  Lab 05/28/21 0922  AST 15  ALT 8  ALKPHOS 101  BILITOT 1.2  PROT 6.6  ALBUMIN 3.2*   No results for input(s): LIPASE, AMYLASE in the last 168 hours. No results for input(s): AMMONIA in the last 168 hours. Coagulation Profile: No results for input(s): INR, PROTIME in the last 168 hours. Cardiac Enzymes: No results for input(s): CKTOTAL, CKMB, CKMBINDEX, TROPONINI in the last 168 hours. BNP (last 3 results) No results for input(s): PROBNP in the last 8760 hours. HbA1C: No results for input(s): HGBA1C in the last 72 hours. CBG: No results for input(s): GLUCAP in the last 168 hours. Lipid Profile: No results for input(s): CHOL, HDL, LDLCALC, TRIG, CHOLHDL, LDLDIRECT in the last 72 hours. Thyroid Function Tests: No results for input(s): TSH, T4TOTAL, FREET4, T3FREE, THYROIDAB in the last 72 hours. Anemia Panel: No results for input(s): VITAMINB12, FOLATE, FERRITIN, TIBC, IRON, RETICCTPCT in the last 72 hours.  Sepsis Labs: Recent Labs  Lab 05/28/21 0034 05/28/21 1056 06/02/21 0110  PROCALCITON 0.36  --  <0.10  LATICACIDVEN 2.5* 1.7  --     Recent Results (from the past 240 hour(s))  Blood culture (routine x 2)     Status: Abnormal   Collection Time: 05/28/21 10:56 AM   Specimen: BLOOD  Result Value Ref Range Status   Specimen Description   Final    BLOOD RIGHT ANTECUBITAL Performed at [facility], [address] [place], [place] [address]    Special Requests   Final    BOTTLES  DRAWN AEROBIC AND ANAEROBIC Blood Culture  adequate volume Performed at [facility], [address] [place], [place] [address]    Culture  Setup Time   Final    GRAM NEGATIVE COCCOBACILLI AEROBIC BOTTLE ONLY CRITICAL VALUE NOTED.  VALUE IS CONSISTENT WITH PREVIOUSLY REPORTED AND CALLED VALUE.    Culture (A)  Final    ACINETOBACTER CALCOACETICUS/BAUMANNII COMPLEX SUSCEPTIBILITIES PERFORMED ON PREVIOUS CULTURE WITHIN THE LAST 5 DAYS. Performed at [facility], [place] [address], [place], [place] [address]    Report Status 05/31/2021 FINAL  Final  Blood culture (routine x 2)     Status: Abnormal   Collection Time: 05/28/21 10:57 AM   Specimen: BLOOD  Result Value Ref Range Status   Specimen Description   Final    BLOOD BLOOD LEFT FOREARM Performed at [facility], [address] [place], [place] [address]    Special Requests   Final    BOTTLES DRAWN AEROBIC AND ANAEROBIC Blood Culture results may not be optimal due to an inadequate volume of blood received in culture bottles Performed at [facility], [address] [place], [place] [address]    Culture  Setup Time   Final    Organism ID to follow GRAM NEGATIVE COCCOBACILLI IN BOTH AEROBIC AND ANAEROBIC BOTTLES CRITICAL RESULT CALLED TO, READ BACK BY AND VERIFIED WITH: AUSTIN REEVES @ 2327 05/28/21 LFD Performed at [facility], [address] [place], [place] [address]    Culture ACINETOBACTER CALCOACETICUS/BAUMANNII COMPLEX (A)  Final   Report Status 05/31/2021 FINAL  Final   Organism ID, Bacteria ACINETOBACTER CALCOACETICUS/BAUMANNII COMPLEX  Final      Susceptibility   Acinetobacter calcoaceticus/baumannii complex - MIC*    CEFTAZIDIME 4 SENSITIVE Sensitive     CIPROFLOXACIN >=4 RESISTANT Resistant     GENTAMICIN 2 SENSITIVE Sensitive     IMIPENEM <=0.25 SENSITIVE Sensitive     PIP/TAZO <=4 SENSITIVE Sensitive     TRIMETH/SULFA <=20 SENSITIVE Sensitive     AMPICILLIN/SULBACTAM <=2 SENSITIVE Sensitive     *  ACINETOBACTER CALCOACETICUS/BAUMANNII COMPLEX  Blood Culture ID Panel (Reflexed)     Status: Abnormal (Preliminary result)   Collection Time: 05/28/21 10:57 AM  Result Value Ref Range Status   Enterococcus faecalis NOT DETECTED NOT DETECTED Final   Enterococcus Faecium NOT DETECTED NOT DETECTED Final   Listeria monocytogenes NOT DETECTED NOT DETECTED Final   Staphylococcus species NOT DETECTED NOT DETECTED Final   Staphylococcus aureus (BCID) PENDING NOT DETECTED Incomplete   Staphylococcus epidermidis NOT DETECTED NOT DETECTED Final   Staphylococcus lugdunensis NOT DETECTED NOT DETECTED Final   Streptococcus species NOT DETECTED NOT DETECTED Final   Streptococcus agalactiae NOT DETECTED NOT DETECTED Final   Streptococcus pneumoniae NOT DETECTED NOT DETECTED Final   Streptococcus pyogenes NOT DETECTED NOT DETECTED Final   A.calcoaceticus-baumannii DETECTED (A) NOT DETECTED Final    Comment: CRITICAL RESULT CALLED TO, READ BACK BY AND VERIFIED WITH: AUSTIN REEVES @ 2327 05/28/21 LFD    Bacteroides fragilis NOT DETECTED NOT DETECTED Final   Enterobacterales NOT DETECTED NOT DETECTED Final   Enterobacter cloacae complex NOT DETECTED NOT DETECTED Final   Escherichia coli NOT DETECTED NOT DETECTED Final   Klebsiella aerogenes NOT DETECTED NOT DETECTED Final   Klebsiella oxytoca NOT DETECTED NOT DETECTED Final   Klebsiella pneumoniae NOT DETECTED NOT DETECTED Final   Proteus species NOT DETECTED NOT DETECTED Final   Salmonella species NOT DETECTED NOT DETECTED Final   Serratia marcescens NOT DETECTED  NOT DETECTED Final   Haemophilus influenzae NOT DETECTED NOT DETECTED Final   Neisseria meningitidis NOT DETECTED NOT DETECTED Final   Pseudomonas aeruginosa NOT DETECTED NOT DETECTED Final   Stenotrophomonas maltophilia NOT DETECTED NOT DETECTED Final   Candida albicans NOT DETECTED NOT DETECTED Final   Candida auris NOT DETECTED NOT DETECTED Final   Candida glabrata NOT DETECTED NOT  DETECTED Final   Candida krusei NOT DETECTED NOT DETECTED Final   Candida parapsilosis NOT DETECTED NOT DETECTED Final   Candida tropicalis NOT DETECTED NOT DETECTED Final   Cryptococcus neoformans/gattii NOT DETECTED NOT DETECTED Final   CTX-M ESBL NOT DETECTED NOT DETECTED Final   Carbapenem resistance IMP NOT DETECTED NOT DETECTED Final   Carbapenem resistance KPC NOT DETECTED NOT DETECTED Final   Carbapenem resistance NDM NOT DETECTED NOT DETECTED Final   Carbapenem resistance VIM NOT DETECTED NOT DETECTED Final    Comment: Performed at [facility], [address] [place], [place] [address]  Resp Panel by RT-PCR (Flu A&B, Covid) Nasopharyngeal Swab     Status: None   Collection Time: 05/28/21 12:46 PM   Specimen: Nasopharyngeal Swab; Nasopharyngeal(NP) swabs in vial transport medium  Result Value Ref Range Status   SARS Coronavirus 2 by RT PCR NEGATIVE NEGATIVE Final    Comment: (NOTE) SARS-CoV-2 target nucleic acids are NOT DETECTED.  The SARS-CoV-2 RNA is generally detectable in upper respiratory specimens during the acute phase of infection. The lowest concentration of SARS-CoV-2 viral copies this assay can detect is 138 copies/mL. A negative result does not preclude SARS-Cov-2 infection and should not be used as the sole basis for treatment or other patient management decisions. A negative result may occur with  improper specimen collection/handling, submission of specimen other than nasopharyngeal swab, presence of viral mutation(s) within the areas targeted by this assay, and inadequate number of viral copies(<138 copies/mL). A negative result must be combined with clinical observations, patient history, and epidemiological information. The expected result is Negative.  Fact Sheet for Patients:  EntrepreneurPulse.com.au  Fact Sheet for Healthcare Providers:  IncredibleEmployment.be  This test is no t yet approved or  cleared by the [place] FDA and  has been authorized for detection and/or diagnosis of SARS-CoV-2 by FDA under an Emergency Use Authorization (EUA). This EUA will remain  in effect (meaning this test can be used) for the duration of the COVID-19 declaration under Section 564(b)(1) of the Act, 21 U.S.C.section 360bbb-3(b)(1), unless the authorization is terminated  or revoked sooner.       Influenza A by PCR NEGATIVE NEGATIVE Final   Influenza B by PCR NEGATIVE NEGATIVE Final    Comment: (NOTE) The Xpert Xpress SARS-CoV-2/FLU/RSV plus assay is intended as an aid in the diagnosis of influenza from Nasopharyngeal swab specimens and should not be used as a sole basis for treatment. Nasal washings and aspirates are unacceptable for Xpert Xpress SARS-CoV-2/FLU/RSV testing.  Fact Sheet for Patients: EntrepreneurPulse.com.au  Fact Sheet for Healthcare Providers: IncredibleEmployment.be  This test is not yet approved or cleared by the [place] FDA and has been authorized for detection and/or diagnosis of SARS-CoV-2 by FDA under an Emergency Use Authorization (EUA). This EUA will remain in effect (meaning this test can be used) for the duration of the COVID-19 declaration under Section 564(b)(1) of the Act, 21 U.S.C. section 360bbb-3(b)(1), unless the authorization is terminated or revoked.  Performed at [facility], [address] [place], [place] [address]   CULTURE, BLOOD (ROUTINE X 2) w Reflex  to ID Panel     Status: None (Preliminary result)   Collection Time: 06/02/21 12:59 AM   Specimen: BLOOD  Result Value Ref Range Status   Specimen Description BLOOD RIGHT HAND  Final   Special Requests   Final    BOTTLES DRAWN AEROBIC AND ANAEROBIC Blood Culture adequate volume   Culture   Final    NO GROWTH 1 DAY Performed at [facility], [address], [place], [place] [address]    Report Status PENDING  Incomplete   CULTURE, BLOOD (ROUTINE X 2) w Reflex to ID Panel     Status: None (Preliminary result)   Collection Time: 06/02/21  1:10 AM   Specimen: BLOOD  Result Value Ref Range Status   Specimen Description BLOOD RIGHT ASSIST CONTROL  Final   Special Requests   Final    BOTTLES DRAWN AEROBIC AND ANAEROBIC Blood Culture adequate volume   Culture   Final    NO GROWTH 1 DAY Performed at [facility], [address] [place], [place] [address]    Report Status PENDING  Incomplete  Aerobic Culture w Gram Stain (superficial specimen)     Status: None (Preliminary result)   Collection Time: 06/02/21  1:27 PM   Specimen: KNEE; Wound  Result Value Ref Range Status   Specimen Description   Final    KNEE Performed at [facility], [address] [place], [place] [address]    Special Requests   Final    NONE Performed at [facility], [address] [place], [place] [address]    Gram Stain   Final    NO SQUAMOUS EPITHELIAL CELLS PRESENT FEW WBC PRESENT, PREDOMINANTLY MONONUCLEAR RARE GRAM POSITIVE COCCI    Culture   Final    NO GROWTH < 24 HOURS Performed at [facility], [place] [address] [place], [place] [address]    Report Status PENDING  Incomplete      Imaging Studies   No results found.   Medications   Scheduled Meds:  atorvastatin  20 mg Oral QHS   bisoprolol  10 mg Oral BID   budesonide (PULMICORT) nebulizer solution  0.5 mg Nebulization BID   collagenase   Topical Daily   escitalopram  20 mg Oral Daily   famotidine  20 mg Oral QHS   ferrous gluconate  324 mg Oral Weekly   furosemide  40 mg Intravenous Once   ipratropium-albuterol  3 mL Nebulization TID   loratadine  10 mg Oral QHS   mometasone-formoterol  2 puff Inhalation BID   And   umeclidinium bromide  1 puff Inhalation Daily   montelukast  10 mg Oral QHS   predniSONE  20 mg Oral Q breakfast   Continuous Infusions:  sodium chloride Stopped (05/28/21 1600)    ampicillin-sulbactam (UNASYN) IV 3 g (06/03/21 1328)       LOS: 6 days     Enzo Bi, md [facility]  06/03/2021, 3:09 PM

## 2021-06-04 DIAGNOSIS — L089 Local infection of the skin and subcutaneous tissue, unspecified: Secondary | ICD-10-CM | POA: Diagnosis not present

## 2021-06-04 DIAGNOSIS — T148XXA Other injury of unspecified body region, initial encounter: Secondary | ICD-10-CM | POA: Diagnosis not present

## 2021-06-04 LAB — CBC
HCT: 32 % — ABNORMAL LOW (ref 36.0–46.0)
Hemoglobin: 9.8 g/dL — ABNORMAL LOW (ref 12.0–15.0)
MCH: 27.7 pg (ref 26.0–34.0)
MCHC: 30.6 g/dL (ref 30.0–36.0)
MCV: 90.4 fL (ref 80.0–100.0)
Platelets: 467 10*3/uL — ABNORMAL HIGH (ref 150–400)
RBC: 3.54 MIL/uL — ABNORMAL LOW (ref 3.87–5.11)
RDW: 17.6 % — ABNORMAL HIGH (ref 11.5–15.5)
WBC: 26.9 10*3/uL — ABNORMAL HIGH (ref 4.0–10.5)
nRBC: 0.2 % (ref 0.0–0.2)

## 2021-06-04 LAB — BASIC METABOLIC PANEL
Anion gap: 13 (ref 5–15)
BUN: 42 mg/dL — ABNORMAL HIGH (ref 8–23)
CO2: 37 mmol/L — ABNORMAL HIGH (ref 22–32)
Calcium: 9.1 mg/dL (ref 8.9–10.3)
Chloride: 90 mmol/L — ABNORMAL LOW (ref 98–111)
Creatinine, Ser: 1.39 mg/dL — ABNORMAL HIGH (ref 0.44–1.00)
GFR, Estimated: 41 mL/min — ABNORMAL LOW (ref >60)
Glucose, Bld: 172 mg/dL — ABNORMAL HIGH (ref 70–99)
Potassium: 4.3 mmol/L (ref 3.5–5.1)
Sodium: 140 mmol/L (ref 135–145)

## 2021-06-04 LAB — MAGNESIUM: Magnesium: 2.3 mg/dL (ref 1.7–2.4)

## 2021-06-04 MED ORDER — LORAZEPAM 1 MG PO TABS
1.0000 mg | ORAL_TABLET | Freq: Every day | ORAL | Status: DC
  Administered 2021-06-04 – 2021-06-05 (×2): 1 mg via ORAL
  Filled 2021-06-04 (×2): qty 1

## 2021-06-04 MED ORDER — LEVALBUTEROL HCL 1.25 MG/0.5ML IN NEBU
1.2500 mg | INHALATION_SOLUTION | Freq: Three times a day (TID) | RESPIRATORY_TRACT | Status: DC
  Administered 2021-06-04 – 2021-06-08 (×12): 1.25 mg via RESPIRATORY_TRACT
  Filled 2021-06-04 (×12): qty 0.5

## 2021-06-04 MED ORDER — LEVALBUTEROL HCL 1.25 MG/0.5ML IN NEBU: 1.2500 mg | INHALATION_SOLUTION | Freq: Three times a day (TID) | RESPIRATORY_TRACT | Status: DC

## 2021-06-04 MED ORDER — DILTIAZEM HCL 30 MG PO TABS
60.0000 mg | ORAL_TABLET | Freq: Three times a day (TID) | ORAL | Status: DC
  Administered 2021-06-04 – 2021-06-08 (×6): 60 mg via ORAL
  Filled 2021-06-04 (×8): qty 2

## 2021-06-04 NOTE — TOC Progression Note (Addendum)
Transition of Care [facility]) - Progression Note    Patient Details  Name: Paula Torres MRN: [id number] Date of Birth: 1950/10/07  Transition of Care [facility]) CM/SW [place], LCSW Phone Number: 06/04/2021, 10:41 AM  Clinical Narrative:   Currently no bed offers. TOC continues to follow for bed offers.    Expected Discharge Plan: [place] Barriers to Discharge: Continued Medical Work up  Expected Discharge Plan and Services Expected Discharge Plan: [place]   Discharge Planning Services: CM Consult Post Acute Care Choice: [place] Living arrangements for the past 2 months: Single Family Home                 DME Arranged: N/A DME Agency: NA       HH Arranged: NA           Social Determinants of Health (SDOH) Interventions    Readmission Risk Interventions No flowsheet data found.

## 2021-06-04 NOTE — Progress Notes (Signed)
PROGRESS NOTE    Paula Torres   CWC:[id number]  DOB: 1951/07/19  PCP: Rusty Aus, MD    DOA: 05/28/2021 LOS: 7    Brief Narrative / Hospital Course to Date:   70 year old female with past medical history of diastolic CHF, hyperlipidemia, COPD chronically on 3 L/min supplemental oxygen, GERD, depression, paroxysmal A. fib on Eliquis, pulmonary hypertension, OSA noncompliant with BiPAP (supposed to use BiPAP but does not tolerate), iron deficiency anemia, CKD stage IIIa who presented to the ED on 05/28/2021 with progressive leg pain with bleeding and oozing after a mechanical fall 3 days prior.  Evaluation in the ED revealed severe sepsis secondary to left lower extremity wound infection and cellulitis.  Admitted to [facility] service and started on empiric IV antibiotics pending cultures.  Patient also with signs of acute COPD exacerbation started on IV steroids and bronchodilators.  Assessment & Plan   Principal Problem:   Wound infection_left lower leg Active Problems:   Hyperlipidemia   COPD exacerbation (HCC)   Pulmonary hypertension (HCC)   Depression   Acute on chronic respiratory failure with hypoxia (HCC)   AF (paroxysmal atrial fibrillation) (HCC)   Severe sepsis (HCC)   Chronic diastolic CHF (congestive heart failure) (HCC)   Acute renal failure superimposed on stage 3a chronic kidney disease (HCC)   Hyponatremia   Hypokalemia   Iron deficiency anemia   Cellulitis of left lower leg   Severe sepsis secondary to left lower extremity cellulitis and wound infection and secondary acinetobacter bacteremia  -patient met criteria for severe sepsis on admission with leukocytosis, tachypnea, lactic acidosis consistent with organ dysfunction.  Treated per sepsis protocol in the ED. Blood cultures growing acinetobacter baumannii LLE doppler U/S negative for DVT --ID consulted --ortho consulted on 9/30, rec local wound care Plan: --cont Unasyn --reconsult wound care nurse  tomorrow  Acute on chronic respiratory failure with hypoxia and hypercapnia secondary to COPD with acute exacerbation  3L O2 at baseline Has advanced COPD and BiPAP recommended but patient states does not tolerate. 9/27 - pt more lethargic, ABG with pCO2 80 (up from 50).   Started on BiPAP (requires mittens to keep it on), transfer to stepdown. PCCM consulted. --transitioned from IV solumedrol to home prednisone 20 mg daily on 9/30 Plan: --cont home prednisone 20 mg daily --Continue bronchodilators --Continue supplemental O2 to keep sats between 88-92%, wean as tolerated --pulm consult, Dr. Lanney Gins to see (pt's outpatient pulm doctor)  Hyponatremia  -present with sodium 129, likely due to poor p.o. intake, dehydration.   --started on salt tablet, d/c'ed on 10/1  Paroxysmal A. Fib, with RVR --developed RVR night of 9/28 --cont bisoprolol to 10 mg BID --add oral dilt 60 q8h today --hold Eliquis due to wound bleeding  Chronic diastolic CHF -prior echo on 01/21/2020 with EF 60 to 65%.  Patient volume status difficult to assess on exam due to morbid obesity with chest x-ray was without any pulmonary edema.  Appears overall compensated. --Lasix held on admission due to worsened renal function and hyponatremia --IV lasix 40 mg x1 on 10/1  AKI, POA, resolved on CKD stage IIIa  -creatinine appears 1.0-1.2.  Presented with creatinine 2.11 and BUN 64, likely due to dehydration with severe sepsis in addition to being on Lasix.  She was treated with IV fluids in the ED.  Pulmonary hypertension  -continue treprostinil  Depression  -continue Lexapro  Hyperlipidemia  -continue Lipitor  Acute on chronic iron deficiency anemia -hemoglobin in August was 10.3, presented with  hemoglobin 7.9.  Suspect blood loss with wound bleeding.   --1u pRBC on 9/27 for Hbg 6.8 Plan: --hold Eliquis --cont iron supplement --transfuse if Hgb <7  OSA  -does not tolerate BiPAP at home or here.  Pt asked  for help to tolerate her BiPAP --pulm consult, Dr. Lanney Gins to see (pt's outpatient pulm doctor) --BiPAP nightly --ativan 1 mg at bedtime to help pt tolerate BiPAP  Leukocytosis, persistent --maybe due to IV solumedrol.  Procal trended down to <0.1.  No fever.   --Monitor WBC   Morbid obesity: Body mass index is 47.99 kg/m.  Complicates overall care and prognosis.  Recommend lifestyle modifications including physical activity and diet for weight loss and overall long-term health.   DVT prophylaxis: SCD/Compression stockings Code Status: Full code  Family Communication:  Status is: inpatient Dispo:   The patient is from: home Anticipated d/c is to: SNF Anticipated d/c date is: undetermined  Patient currently is not medically stable to d/c due to: abx plan hasn't been finalized.    Subjective 06/04/21    Per RN, pt wore BiPAP for 15 minutes last night, and then refused.  Pt didn't remember what happened yesterday.  Had a discussion with pt on the importance of her wearing BiPAP when sleeping.   Consults, Procedures, Significant Events   Consultants:  None  Procedures:  None  Antimicrobials:  Anti-infectives (From admission, onward)    Start     Dose/Rate Route Frequency Ordered Stop   05/29/21 1100  cefTRIAXone (ROCEPHIN) 2 g in sodium chloride 0.9 % 100 mL IVPB  Status:  Discontinued        2 g 200 mL/hr over 30 Minutes Intravenous Every 24 hours 05/28/21 1244 05/29/21 0248   05/29/21 1000  Ampicillin-Sulbactam (UNASYN) 3 g in sodium chloride 0.9 % 100 mL IVPB        3 g 200 mL/hr over 30 Minutes Intravenous Every 6 hours 05/29/21 0825     05/29/21 0400  Ampicillin-Sulbactam (UNASYN) 3 g in sodium chloride 0.9 % 100 mL IVPB  Status:  Discontinued        3 g 200 mL/hr over 30 Minutes Intravenous Every 12 hours 05/29/21 0251 05/29/21 0353   05/29/21 0400  Ampicillin-Sulbactam (UNASYN) 3 g in sodium chloride 0.9 % 100 mL IVPB  Status:  Discontinued        3 g 200  mL/hr over 30 Minutes Intravenous Every 8 hours 05/29/21 0353 05/29/21 0825   05/28/21 1309  vancomycin variable dose per unstable renal function (pharmacist dosing)  Status:  Discontinued         Does not apply See admin instructions 05/28/21 1309 05/29/21 0258   05/28/21 1200  vancomycin (VANCOREADY) IVPB 1500 mg/300 mL        1,500 mg 150 mL/hr over 120 Minutes Intravenous  Once 05/28/21 1102 05/28/21 1657   05/28/21 1100  vancomycin (VANCOCIN) IVPB 1000 mg/200 mL premix        1,000 mg 200 mL/hr over 60 Minutes Intravenous  Once 05/28/21 1056 05/28/21 1554   05/28/21 1100  cefTRIAXone (ROCEPHIN) 2 g in sodium chloride 0.9 % 100 mL IVPB        2 g 200 mL/hr over 30 Minutes Intravenous  Once 05/28/21 1056 05/28/21 1553         Micro    Objective   Vitals:   06/04/21 0740 06/04/21 1246 06/04/21 1411 06/04/21 1557  BP:  (!) 126/99  109/78  Pulse:  (!) 104  Marland Kitchen)  105  Resp:  20  20  Temp:  98.6 F (37 C)  98.7 F (37.1 C)  TempSrc:  Oral  Oral  SpO2: 97% 96% 94% 95%  Weight:      Height:        Intake/Output Summary (Last 24 hours) at 06/04/2021 1605 Last data filed at 06/04/2021 0640 Gross per 24 hour  Intake --  Output 1350 ml  Net -1350 ml    Filed Weights   06/01/21 0410 06/02/21 0500 06/04/21 0500  Weight: (!) 136.2 kg 131.5 kg 130.8 kg    Physical Exam:  Constitutional: NAD, AAOx3 HEENT: conjunctivae and lids normal, EOMI CV: No cyanosis.   RESP: normal respiratory effort, on 3L Extremities: edema in BLE, L>R SKIN: warm, dry, dressing over left knee Neuro: II - XII grossly intact.   Psych: depressed mood and affect.     Labs   Data Reviewed: I have personally reviewed following labs and imaging studies  CBC: Recent Labs  Lab 05/31/21 0538 06/01/21 0438 06/02/21 0110 06/03/21 0505 06/04/21 0638  WBC 24.0* 23.5* 24.5* 26.2* 26.9*  HGB 8.1* 8.2* 8.7* 9.6* 9.8*  HCT 26.0* 27.7* 29.1* 31.1* 32.0*  MCV 88.7 89.4 89.8 90.9 90.4  PLT 440* 449* 476*  479* 098*   Basic Metabolic Panel: Recent Labs  Lab 05/31/21 0538 06/01/21 0438 06/02/21 0110 06/03/21 0505 06/04/21 0638  NA 136 138 137 140 140  K 3.6 4.1 4.5 4.3 4.3  CL 85* 88* 90* 92* 90*  CO2 40* 39* 36* 39* 37*  GLUCOSE 245* 211* 229* 143* 172*  BUN 51* 41* 44* 40* 42*  CREATININE 1.19* 1.12* 1.25* 1.14* 1.39*  CALCIUM 9.2 9.2 8.9 9.0 9.1  MG 2.8* 2.5* 2.4 2.4 2.3   GFR: Estimated Creatinine Clearance: 52.2 mL/min (A) (by C-G formula based on SCr of 1.39 mg/dL (H)). Liver Function Tests: No results for input(s): AST, ALT, ALKPHOS, BILITOT, PROT, ALBUMIN in the last 168 hours.  No results for input(s): LIPASE, AMYLASE in the last 168 hours. No results for input(s): AMMONIA in the last 168 hours. Coagulation Profile: No results for input(s): INR, PROTIME in the last 168 hours. Cardiac Enzymes: No results for input(s): CKTOTAL, CKMB, CKMBINDEX, TROPONINI in the last 168 hours. BNP (last 3 results) No results for input(s): PROBNP in the last 8760 hours. HbA1C: No results for input(s): HGBA1C in the last 72 hours. CBG: No results for input(s): GLUCAP in the last 168 hours. Lipid Profile: No results for input(s): CHOL, HDL, LDLCALC, TRIG, CHOLHDL, LDLDIRECT in the last 72 hours. Thyroid Function Tests: No results for input(s): TSH, T4TOTAL, FREET4, T3FREE, THYROIDAB in the last 72 hours. Anemia Panel: No results for input(s): VITAMINB12, FOLATE, FERRITIN, TIBC, IRON, RETICCTPCT in the last 72 hours.  Sepsis Labs: Recent Labs  Lab 06/02/21 0110  PROCALCITON <0.10    Recent Results (from the past 240 hour(s))  Blood culture (routine x 2)     Status: Abnormal   Collection Time: 05/28/21 10:56 AM   Specimen: BLOOD  Result Value Ref Range Status   Specimen Description   Final    BLOOD RIGHT ANTECUBITAL Performed at [facility], [address] [place], [place] [address]    Special Requests   Final    BOTTLES DRAWN AEROBIC AND ANAEROBIC Blood Culture  adequate volume Performed at [facility], [address] [place], [place] [address]    Culture  Setup Time   Final    GRAM NEGATIVE COCCOBACILLI AEROBIC  BOTTLE ONLY CRITICAL VALUE NOTED.  VALUE IS CONSISTENT WITH PREVIOUSLY REPORTED AND CALLED VALUE.    Culture (A)  Final    ACINETOBACTER CALCOACETICUS/BAUMANNII COMPLEX SUSCEPTIBILITIES PERFORMED ON PREVIOUS CULTURE WITHIN THE LAST 5 DAYS. Performed at [facility], [place] [address] [place], [place] [address]    Report Status 05/31/2021 FINAL  Final  Blood culture (routine x 2)     Status: Abnormal   Collection Time: 05/28/21 10:57 AM   Specimen: BLOOD  Result Value Ref Range Status   Specimen Description   Final    BLOOD BLOOD LEFT FOREARM Performed at [facility], [address] [place], [place] [address]    Special Requests   Final    BOTTLES DRAWN AEROBIC AND ANAEROBIC Blood Culture results may not be optimal due to an inadequate volume of blood received in culture bottles Performed at [facility], [address] [place], [place] [address]    Culture  Setup Time   Final    Organism ID to follow GRAM NEGATIVE COCCOBACILLI IN BOTH AEROBIC AND ANAEROBIC BOTTLES CRITICAL RESULT CALLED TO, READ BACK BY AND VERIFIED WITH: AUSTIN REEVES @ 2327 05/28/21 LFD Performed at [facility], [address] [place], [place] [address]    Culture ACINETOBACTER CALCOACETICUS/BAUMANNII COMPLEX (A)  Final   Report Status 05/31/2021 FINAL  Final   Organism ID, Bacteria ACINETOBACTER CALCOACETICUS/BAUMANNII COMPLEX  Final      Susceptibility   Acinetobacter calcoaceticus/baumannii complex - MIC*    CEFTAZIDIME 4 SENSITIVE Sensitive     CIPROFLOXACIN >=4 RESISTANT Resistant     GENTAMICIN 2 SENSITIVE Sensitive     IMIPENEM <=0.25 SENSITIVE Sensitive     PIP/TAZO <=4 SENSITIVE Sensitive     TRIMETH/SULFA <=20 SENSITIVE Sensitive     AMPICILLIN/SULBACTAM <=2 SENSITIVE Sensitive     *  ACINETOBACTER CALCOACETICUS/BAUMANNII COMPLEX  Blood Culture ID Panel (Reflexed)     Status: Abnormal (Preliminary result)   Collection Time: 05/28/21 10:57 AM  Result Value Ref Range Status   Enterococcus faecalis NOT DETECTED NOT DETECTED Final   Enterococcus Faecium NOT DETECTED NOT DETECTED Final   Listeria monocytogenes NOT DETECTED NOT DETECTED Final   Staphylococcus species NOT DETECTED NOT DETECTED Final   Staphylococcus aureus (BCID) PENDING NOT DETECTED Incomplete   Staphylococcus epidermidis NOT DETECTED NOT DETECTED Final   Staphylococcus lugdunensis NOT DETECTED NOT DETECTED Final   Streptococcus species NOT DETECTED NOT DETECTED Final   Streptococcus agalactiae NOT DETECTED NOT DETECTED Final   Streptococcus pneumoniae NOT DETECTED NOT DETECTED Final   Streptococcus pyogenes NOT DETECTED NOT DETECTED Final   A.calcoaceticus-baumannii DETECTED (A) NOT DETECTED Final    Comment: CRITICAL RESULT CALLED TO, READ BACK BY AND VERIFIED WITH: AUSTIN REEVES @ 2327 05/28/21 LFD    Bacteroides fragilis NOT DETECTED NOT DETECTED Final   Enterobacterales NOT DETECTED NOT DETECTED Final   Enterobacter cloacae complex NOT DETECTED NOT DETECTED Final   Escherichia coli NOT DETECTED NOT DETECTED Final   Klebsiella aerogenes NOT DETECTED NOT DETECTED Final   Klebsiella oxytoca NOT DETECTED NOT DETECTED Final   Klebsiella pneumoniae NOT DETECTED NOT DETECTED Final   Proteus species NOT DETECTED NOT DETECTED Final   Salmonella species NOT DETECTED NOT DETECTED Final   Serratia marcescens NOT DETECTED NOT DETECTED Final   Haemophilus influenzae NOT DETECTED NOT DETECTED Final   Neisseria meningitidis NOT DETECTED NOT DETECTED Final   Pseudomonas aeruginosa NOT DETECTED NOT DETECTED Final   Stenotrophomonas maltophilia NOT DETECTED NOT DETECTED  Final   Candida albicans NOT DETECTED NOT DETECTED Final   Candida auris NOT DETECTED NOT DETECTED Final   Candida glabrata NOT DETECTED NOT  DETECTED Final   Candida krusei NOT DETECTED NOT DETECTED Final   Candida parapsilosis NOT DETECTED NOT DETECTED Final   Candida tropicalis NOT DETECTED NOT DETECTED Final   Cryptococcus neoformans/gattii NOT DETECTED NOT DETECTED Final   CTX-M ESBL NOT DETECTED NOT DETECTED Final   Carbapenem resistance IMP NOT DETECTED NOT DETECTED Final   Carbapenem resistance KPC NOT DETECTED NOT DETECTED Final   Carbapenem resistance NDM NOT DETECTED NOT DETECTED Final   Carbapenem resistance VIM NOT DETECTED NOT DETECTED Final    Comment: Performed at [facility], [address] [place], [place] [address]  Resp Panel by RT-PCR (Flu A&B, Covid) Nasopharyngeal Swab     Status: None   Collection Time: 05/28/21 12:46 PM   Specimen: Nasopharyngeal Swab; Nasopharyngeal(NP) swabs in vial transport medium  Result Value Ref Range Status   SARS Coronavirus 2 by RT PCR NEGATIVE NEGATIVE Final    Comment: (NOTE) SARS-CoV-2 target nucleic acids are NOT DETECTED.  The SARS-CoV-2 RNA is generally detectable in upper respiratory specimens during the acute phase of infection. The lowest concentration of SARS-CoV-2 viral copies this assay can detect is 138 copies/mL. A negative result does not preclude SARS-Cov-2 infection and should not be used as the sole basis for treatment or other patient management decisions. A negative result may occur with  improper specimen collection/handling, submission of specimen other than nasopharyngeal swab, presence of viral mutation(s) within the areas targeted by this assay, and inadequate number of viral copies(<138 copies/mL). A negative result must be combined with clinical observations, patient history, and epidemiological information. The expected result is Negative.  Fact Sheet for Patients:  EntrepreneurPulse.com.au  Fact Sheet for Healthcare Providers:  IncredibleEmployment.be  This test is no t yet approved or  cleared by the [place] FDA and  has been authorized for detection and/or diagnosis of SARS-CoV-2 by FDA under an Emergency Use Authorization (EUA). This EUA will remain  in effect (meaning this test can be used) for the duration of the COVID-19 declaration under Section 564(b)(1) of the Act, 21 U.S.C.section 360bbb-3(b)(1), unless the authorization is terminated  or revoked sooner.       Influenza A by PCR NEGATIVE NEGATIVE Final   Influenza B by PCR NEGATIVE NEGATIVE Final    Comment: (NOTE) The Xpert Xpress SARS-CoV-2/FLU/RSV plus assay is intended as an aid in the diagnosis of influenza from Nasopharyngeal swab specimens and should not be used as a sole basis for treatment. Nasal washings and aspirates are unacceptable for Xpert Xpress SARS-CoV-2/FLU/RSV testing.  Fact Sheet for Patients: EntrepreneurPulse.com.au  Fact Sheet for Healthcare Providers: IncredibleEmployment.be  This test is not yet approved or cleared by the [place] FDA and has been authorized for detection and/or diagnosis of SARS-CoV-2 by FDA under an Emergency Use Authorization (EUA). This EUA will remain in effect (meaning this test can be used) for the duration of the COVID-19 declaration under Section 564(b)(1) of the Act, 21 U.S.C. section 360bbb-3(b)(1), unless the authorization is terminated or revoked.  Performed at [facility], [address] [place], [place] [address]   CULTURE, BLOOD (ROUTINE X 2) w Reflex to ID Panel     Status: None (Preliminary result)   Collection Time: 06/02/21 12:59 AM   Specimen: BLOOD  Result Value Ref Range Status   Specimen Description BLOOD RIGHT HAND  Final  Special Requests   Final    BOTTLES DRAWN AEROBIC AND ANAEROBIC Blood Culture adequate volume   Culture   Final    NO GROWTH 2 DAYS Performed at [facility], [address] [place], [place] [address]    Report Status PENDING  Incomplete   CULTURE, BLOOD (ROUTINE X 2) w Reflex to ID Panel     Status: None (Preliminary result)   Collection Time: 06/02/21  1:10 AM   Specimen: BLOOD  Result Value Ref Range Status   Specimen Description BLOOD RIGHT ASSIST CONTROL  Final   Special Requests   Final    BOTTLES DRAWN AEROBIC AND ANAEROBIC Blood Culture adequate volume   Culture   Final    NO GROWTH 2 DAYS Performed at [facility], [address] [place], [place] [address]    Report Status PENDING  Incomplete  Aerobic Culture w Gram Stain (superficial specimen)     Status: None (Preliminary result)   Collection Time: 06/02/21  1:27 PM   Specimen: KNEE; Wound  Result Value Ref Range Status   Specimen Description   Final    KNEE Performed at [facility], [address], [place], [place] [address]    Special Requests   Final    NONE Performed at [facility], [address], [place], [place] [address]    Gram Stain   Final    NO SQUAMOUS EPITHELIAL CELLS PRESENT FEW WBC PRESENT, PREDOMINANTLY MONONUCLEAR RARE GRAM POSITIVE COCCI    Culture   Final    RARE STAPHYLOCOCCUS AUREUS CULTURE REINCUBATED FOR BETTER GROWTH Performed at [facility], [place] [address], [place], [place] [address]    Report Status PENDING  Incomplete      Imaging Studies   No results found.   Medications   Scheduled Meds:  atorvastatin  20 mg Oral QHS   bisoprolol  10 mg Oral BID   collagenase   Topical Daily   escitalopram  20 mg Oral Daily   famotidine  20 mg Oral QHS   ferrous gluconate  324 mg Oral Weekly   ipratropium-albuterol  3 mL Nebulization TID   loratadine  10 mg Oral QHS   mometasone-formoterol  2 puff Inhalation BID   And   umeclidinium bromide  1 puff Inhalation Daily   montelukast  10 mg Oral QHS   predniSONE  20 mg Oral Q breakfast   Continuous Infusions:  sodium chloride Stopped (05/28/21 1600)   ampicillin-sulbactam (UNASYN) IV 3 g (06/04/21 1120)       LOS: 7 days      Enzo Bi, md [facility]  06/04/2021, 4:05 PM

## 2021-06-05 DIAGNOSIS — L089 Local infection of the skin and subcutaneous tissue, unspecified: Secondary | ICD-10-CM | POA: Diagnosis not present

## 2021-06-05 DIAGNOSIS — T148XXA Other injury of unspecified body region, initial encounter: Secondary | ICD-10-CM | POA: Diagnosis not present

## 2021-06-05 LAB — BASIC METABOLIC PANEL
Anion gap: 7 (ref 5–15)
BUN: 37 mg/dL — ABNORMAL HIGH (ref 8–23)
CO2: 36 mmol/L — ABNORMAL HIGH (ref 22–32)
Calcium: 8.4 mg/dL — ABNORMAL LOW (ref 8.9–10.3)
Chloride: 93 mmol/L — ABNORMAL LOW (ref 98–111)
Creatinine, Ser: 1.23 mg/dL — ABNORMAL HIGH (ref 0.44–1.00)
GFR, Estimated: 48 mL/min — ABNORMAL LOW (ref >60)
Glucose, Bld: 139 mg/dL — ABNORMAL HIGH (ref 70–99)
Potassium: 3.9 mmol/L (ref 3.5–5.1)
Sodium: 136 mmol/L (ref 135–145)

## 2021-06-05 LAB — CBC
HCT: 27.7 % — ABNORMAL LOW (ref 36.0–46.0)
Hemoglobin: 8.5 g/dL — ABNORMAL LOW (ref 12.0–15.0)
MCH: 27.2 pg (ref 26.0–34.0)
MCHC: 30.7 g/dL (ref 30.0–36.0)
MCV: 88.8 fL (ref 80.0–100.0)
Platelets: 351 10*3/uL (ref 150–400)
RBC: 3.12 MIL/uL — ABNORMAL LOW (ref 3.87–5.11)
RDW: 17.5 % — ABNORMAL HIGH (ref 11.5–15.5)
WBC: 22.8 10*3/uL — ABNORMAL HIGH (ref 4.0–10.5)
nRBC: 0.1 % (ref 0.0–0.2)

## 2021-06-05 LAB — MAGNESIUM: Magnesium: 2.2 mg/dL (ref 1.7–2.4)

## 2021-06-05 MED ORDER — LINEZOLID 600 MG PO TABS
600.0000 mg | ORAL_TABLET | Freq: Two times a day (BID) | ORAL | Status: DC
  Administered 2021-06-05: 600 mg via ORAL
  Filled 2021-06-05 (×3): qty 1

## 2021-06-05 MED ORDER — ALBUTEROL SULFATE (2.5 MG/3ML) 0.083% IN NEBU: 3.0000 mL | INHALATION_SOLUTION | RESPIRATORY_TRACT | Status: DC | PRN

## 2021-06-05 MED ORDER — QUETIAPINE FUMARATE 25 MG PO TABS
100.0000 mg | ORAL_TABLET | Freq: Every day | ORAL | Status: DC
  Administered 2021-06-05: 100 mg via ORAL
  Filled 2021-06-05: qty 4

## 2021-06-05 NOTE — Progress Notes (Signed)
Physical Therapy Treatment Patient Details Name: Paula Torres MRN: [id number] DOB: [birth year] Today's Date: 06/05/2021   History of Present Illness 70 y.o. female with medical history significant of dCHF, HLD, COPD on 3 L oxygen, GERD, depression, PAF on Eliquis, pulmonary HTN, OSA on BiPAP, CAD, iron deficiency anemia, CKD-3A, who presents with left leg wound with bleeding and pain.    PT Comments    Pt received sitting upright in bed with RLE hanging off side of bed, agreeable to therapy and moaning with buttock pain due to increased time sitting in bed per pt report. Pt obtained sitting on EOB, distant SUP, with increased time and PT prompting/encouraging as PT arranged room for mobility. STS, stand pivot transfer and 41f of ambulation all performed with close CGA and RW for stability. Pt limited to 356fof ambulation due to fatigue. Significant SOB noted after ambulation. HR remained <105bpm and SpO2 declined to 86% however pt recovered to 90% once seated - PT instructed through pursed lip breathing. On 3 occasions, pt required reminding of who PT was and goal of session. Plan to increase ambulation distance or perform multiple bouts next session dependent on pt vitals. Would benefit from skilled PT to address above deficits and promote optimal return to PLOF.    Recommendations for follow up therapy are one component of a multi-disciplinary discharge planning process, led by the attending physician.  Recommendations may be updated based on patient status, additional functional criteria and insurance authorization.  Follow Up Recommendations  SNF;Supervision for mobility/OOB     Equipment Recommendations  Other (comment) (TBD at next venue of care)    Recommendations for Other Services       Precautions / Restrictions Precautions Precautions: Fall Precaution Comments: monitor O2 sats, HR Restrictions Weight Bearing Restrictions: No Other Position/Activity Restrictions: BLE L>R  wounds     Mobility  Bed Mobility Overal bed mobility: Needs Assistance Bed Mobility: Supine to Sit     Supine to sit: Supervision     General bed mobility comments: Increased time and effort to perform. SUP for safety. Bed features used.    Transfers Overall transfer level: Needs assistance Equipment used: Rolling walker (2 wheeled) Transfers: Sit to/from [facility] to Stand: From elevated surface;Min guard Stand pivot transfers: Min guard       General transfer comment: CGA to steady with VC on sequencing and hand palcement. STS from EOB and BSC. Stand pivot EOB>BSC; [facility] ([id number]  Ambulation/Gait Ambulation/Gait assistance: Min guard Gait Distance (Feet): 3 Feet Assistive device: Rolling walker (2 wheeled) Gait Pattern/deviations: Decreased step length - right;Decreased step length - left;Trunk flexed;Antalgic;Step-to pattern     General Gait Details: Forward amb[facility] [id number] [facility]* turn.   Stairs             Wheelchair Mobility    Modified Rankin (Stroke Patients Only)       Balance Overall balance assessment: Needs assistance Sitting-balance support: Feet supported;No upper extremity supported Sitting balance-Leahy Scale: Fair Sitting balance - Comments: Increased sitting time in [facility] able to hold without UE support however majority of the time was spent leaning in all directions with UE support of arm rails/pt LE's.   Standing balance support: Bilateral upper extremity supported Standing balance-Leahy Scale: Poor Standing balance comment: CGA and RW required for standing stability - static and dynamic  Cognition Arousal/Alertness: Awake/alert Behavior During Therapy: WFL for tasks assessed/performed Overall Cognitive Status: Within Functional Limits for tasks assessed                                         Exercises      General Comments General comments (skin integrity, edema, etc.): Vitals: HR remained <105bpm and SpO2 declined to 86% with pt recovering to 90% with seated rest break. Pt remained on 3L O2.      Pertinent Vitals/Pain Pain Assessment: Faces Faces Pain Scale: Hurts little more Pain Location: bilateral buttocks - reports 2/2 sitting in bed Pain Descriptors / Indicators: Grimacing;Moaning;Aching Pain Intervention(s): Monitored during session;Repositioned    Home Living                      Prior Function            PT Goals (current goals can now be found in the care plan section) Acute Rehab PT Goals Patient Stated Goal: to go home PT Goal Formulation: With patient Time For Goal Achievement: 06/15/2021 Potential to Achieve Goals: Fair    Frequency    Min 2X/week      PT Plan      Co-evaluation              AM-PAC PT "6 Clicks" Mobility   Outcome Measure  Help needed turning from your back to your side while in a flat bed without using bedrails?: A Little Help needed moving from lying on your back to sitting on the side of a flat bed without using bedrails?: A Little Help needed moving to and from a bed to a chair (including a wheelchair)?: A Little Help needed standing up from a chair using your arms (e.g., wheelchair or bedside chair)?: A Little Help needed to walk in hospital room?: A Little Help needed climbing 3-5 steps with a railing? : Total 6 Click Score: 16    End of Session Equipment Utilized During Treatment: Gait belt;Oxygen Activity Tolerance: Patient tolerated treatment well Patient left: with call bell/phone within reach;in chair;with chair alarm set;with nursing/sitter in room Nurse Communication: Mobility status;Precautions PT Visit Diagnosis: Unsteadiness on feet (R26.81);Other abnormalities of gait and mobility (R26.89);Repeated falls (R29.6);Muscle weakness (generalized) (M62.81);Difficulty in walking, not elsewhere  classified (R26.2);Pain Pain - Right/Left: Left Pain - part of body: Knee;Leg     Time: 7308-5694 PT Time Calculation (min) (ACUTE ONLY): 40 min  Charges:  $Therapeutic Activity: 38-52 mins                     Patrina Levering PT, DPT 06/05/21 12:50 PM [phone]    Paula Torres 06/05/2021, 12:26 PM

## 2021-06-05 NOTE — Progress Notes (Signed)
Patient refused wound care to BLE x 3 attempts, states "they are fine"

## 2021-06-05 NOTE — Progress Notes (Addendum)
PROGRESS NOTE    Paula Torres   CZY:[id number]  DOB: 1950-09-12  PCP: Rusty Aus, MD    DOA: 05/28/2021 LOS: 8    Brief Narrative / Hospital Course to Date:   70 year old female with past medical history of diastolic CHF, hyperlipidemia, COPD chronically on 3 L/min supplemental oxygen, GERD, depression, paroxysmal A. fib on Eliquis, pulmonary hypertension, OSA noncompliant with BiPAP (supposed to use BiPAP but does not tolerate), iron deficiency anemia, CKD stage IIIa who presented to the ED on 05/28/2021 with progressive leg pain with bleeding and oozing after a mechanical fall 3 days prior.  Evaluation in the ED revealed severe sepsis secondary to left lower extremity wound infection and cellulitis.  Admitted to [facility] service and started on empiric IV antibiotics pending cultures.  Patient also with signs of acute COPD exacerbation started on IV steroids and bronchodilators.  Assessment & Plan   Principal Problem:   Wound infection_left lower leg Active Problems:   Hyperlipidemia   COPD exacerbation (HCC)   Pulmonary hypertension (HCC)   Depression   Acute on chronic respiratory failure with hypoxia (HCC)   AF (paroxysmal atrial fibrillation) (HCC)   Severe sepsis (HCC)   Chronic diastolic CHF (congestive heart failure) (HCC)   Acute renal failure superimposed on stage 3a chronic kidney disease (HCC)   Hyponatremia   Hypokalemia   Iron deficiency anemia   Cellulitis of left lower leg   Severe sepsis secondary to left lower extremity cellulitis and wound infection and secondary acinetobacter bacteremia  -patient met criteria for severe sepsis on admission with leukocytosis, tachypnea, lactic acidosis consistent with organ dysfunction.  Treated per sepsis protocol in the ED. Blood cultures growing acinetobacter baumannii LLE doppler U/S negative for DVT --ID consulted --ortho consulted on 9/30, rec local wound care Plan: --cont Unasyn --reconsult wound care nurse for  wound check  Acute on chronic respiratory failure with hypoxia and hypercapnia secondary to COPD with acute exacerbation  3L O2 at baseline Has advanced COPD and BiPAP recommended but patient states does not tolerate. 9/27 - pt more lethargic, ABG with pCO2 80 (up from 50).   Started on BiPAP (requires mittens to keep it on), transfer to stepdown. PCCM consulted. --transitioned from IV solumedrol to home prednisone 20 mg daily on 9/30 Plan: --cont home prednisone 20 mg daily --Continue bronchodilators --Continue supplemental O2 to keep sats between 88-92%, wean as tolerated --pulm consult, Dr. Lanney Gins to see (pt's outpatient pulm doctor)  Hyponatremia  -present with sodium 129, likely due to poor p.o. intake, dehydration.   --started on salt tablet, d/c'ed on 10/1  Paroxysmal A. Fib, with RVR --developed RVR night of 9/28 --cont bisoprolol to 10 mg BID --cont dilt 60 q8h (new) --hold Eliquis due to wound bleeding  Chronic diastolic CHF -prior echo on 01/21/2020 with EF 60 to 65%.  Patient volume status difficult to assess on exam due to morbid obesity with chest x-ray was without any pulmonary edema.  Appears overall compensated. --Lasix held on admission due to worsened renal function and hyponatremia --IV lasix 40 mg x1 on 10/1, with subsequent Cr bump --Hold further IV diuresis  AKI, POA, resolved on CKD stage IIIa  -creatinine appears 1.0-1.2.  Presented with creatinine 2.11 and BUN 64, likely due to dehydration with severe sepsis in addition to being on Lasix.  She was treated with IV fluids in the ED.  Pulmonary hypertension  -continue treprostinil  Depression  -continue Lexapro  Hyperlipidemia  -continue Lipitor  Acute on chronic iron  deficiency anemia -hemoglobin in August was 10.3, presented with hemoglobin 7.9.  Suspect blood loss with wound bleeding.   --1u pRBC on 9/27 for Hbg 6.8 Plan: --hold Eliquis --cont iron supplement --transfuse if Hgb <7  OSA   -does not tolerate BiPAP at home or here.  Pt asked for help to tolerate her BiPAP --pulm consult, Dr. Lanney Gins to see (pt's outpatient pulm doctor) --BiPAP nightly --ativan 1 mg at bedtime to help pt tolerate BiPAP --add seroquel at bedtime to help pt tolerate BiPAP  Leukocytosis, persistent --maybe due to IV solumedrol.  Procal trended down to <0.1.  No fever.   --Monitor WBC   Morbid obesity: Body mass index is 50.26 kg/m.  Complicates overall care and prognosis.  Recommend lifestyle modifications including physical activity and diet for weight loss and overall long-term health.   DVT prophylaxis: SCD/Compression stockings Code Status: Full code  Family Communication:  Status is: inpatient Dispo:   The patient is from: home Anticipated d/c is to: SNF Anticipated d/c date is: undetermined  Patient currently is not medically stable to d/c due to: abx plan hasn't been finalized, pt couldn't tolerate BiPAP and goes into comatosed state every 3-4 days   Subjective 06/05/21    Pt said she no one came to put on BiPAP for her when she asked last night.  Didn't feel much different after the ativan (given to help with BiPAP tolerance).   Consults, Procedures, Significant Events   Consultants:  None  Procedures:  None  Antimicrobials:  Anti-infectives (From admission, onward)    Start     Dose/Rate Route Frequency Ordered Stop   06/05/21 1600  linezolid (ZYVOX) tablet 600 mg        600 mg Oral Every 12 hours 06/05/21 1503     05/29/21 1100  cefTRIAXone (ROCEPHIN) 2 g in sodium chloride 0.9 % 100 mL IVPB  Status:  Discontinued        2 g 200 mL/hr over 30 Minutes Intravenous Every 24 hours 05/28/21 1244 05/29/21 0248   05/29/21 1000  Ampicillin-Sulbactam (UNASYN) 3 g in sodium chloride 0.9 % 100 mL IVPB        3 g 200 mL/hr over 30 Minutes Intravenous Every 6 hours 05/29/21 0825     05/29/21 0400  Ampicillin-Sulbactam (UNASYN) 3 g in sodium chloride 0.9 % 100 mL IVPB   Status:  Discontinued        3 g 200 mL/hr over 30 Minutes Intravenous Every 12 hours 05/29/21 0251 05/29/21 0353   05/29/21 0400  Ampicillin-Sulbactam (UNASYN) 3 g in sodium chloride 0.9 % 100 mL IVPB  Status:  Discontinued        3 g 200 mL/hr over 30 Minutes Intravenous Every 8 hours 05/29/21 0353 05/29/21 0825   05/28/21 1309  vancomycin variable dose per unstable renal function (pharmacist dosing)  Status:  Discontinued         Does not apply See admin instructions 05/28/21 1309 05/29/21 0258   05/28/21 1200  vancomycin (VANCOREADY) IVPB 1500 mg/300 mL        1,500 mg 150 mL/hr over 120 Minutes Intravenous  Once 05/28/21 1102 05/28/21 1657   05/28/21 1100  vancomycin (VANCOCIN) IVPB 1000 mg/200 mL premix        1,000 mg 200 mL/hr over 60 Minutes Intravenous  Once 05/28/21 1056 05/28/21 1554   05/28/21 1100  cefTRIAXone (ROCEPHIN) 2 g in sodium chloride 0.9 % 100 mL IVPB        2  g 200 mL/hr over 30 Minutes Intravenous  Once 05/28/21 1056 05/28/21 1553         Micro    Objective   Vitals:   06/05/21 0500 06/05/21 0808 06/05/21 1112 06/05/21 1458  BP:  (!) 110/96 (!) 101/53   Pulse:  78 92   Resp:  18 18   Temp:  97.7 F (36.5 C) 98 F (36.7 C)   TempSrc:      SpO2:  95% (!) 89% 91%  Weight: (!) 137 kg     Height:        Intake/Output Summary (Last 24 hours) at 06/05/2021 1820 Last data filed at 06/05/2021 0555 Gross per 24 hour  Intake --  Output 700 ml  Net -700 ml    Filed Weights   06/02/21 0500 06/04/21 0500 06/05/21 0500  Weight: 131.5 kg 130.8 kg (!) 137 kg    Physical Exam:  Constitutional: NAD, AAOx3 HEENT: conjunctivae and lids normal, EOMI CV: No cyanosis.   RESP: normal respiratory effort, on [place] Extremities: left knee with bandage, both legs wrapped SKIN: warm, dry, extensive bruising Neuro: II - XII grossly intact.   Psych: Normal mood and affect.  Appropriate judgement and reason    Labs   Data Reviewed: I have personally reviewed  following labs and imaging studies  CBC: Recent Labs  Lab 06/01/21 0438 06/02/21 0110 06/03/21 0505 06/04/21 0638 06/05/21 0422  WBC 23.5* 24.5* 26.2* 26.9* 22.8*  HGB 8.2* 8.7* 9.6* 9.8* 8.5*  HCT 27.7* 29.1* 31.1* 32.0* 27.7*  MCV 89.4 89.8 90.9 90.4 88.8  PLT 449* 476* 479* 467* 433   Basic Metabolic Panel: Recent Labs  Lab 06/01/21 0438 06/02/21 0110 06/03/21 0505 06/04/21 0638 06/05/21 0422  NA 138 137 140 140 136  K 4.1 4.5 4.3 4.3 3.9  CL 88* 90* 92* 90* 93*  CO2 39* 36* 39* 37* 36*  GLUCOSE 211* 229* 143* 172* 139*  BUN 41* 44* 40* 42* 37*  CREATININE 1.12* 1.25* 1.14* 1.39* 1.23*  CALCIUM 9.2 8.9 9.0 9.1 8.4*  MG 2.5* 2.4 2.4 2.3 2.2   GFR: Estimated Creatinine Clearance: 60.6 mL/min (A) (by C-G formula based on SCr of 1.23 mg/dL (H)). Liver Function Tests: No results for input(s): AST, ALT, ALKPHOS, BILITOT, PROT, ALBUMIN in the last 168 hours.  No results for input(s): LIPASE, AMYLASE in the last 168 hours. No results for input(s): AMMONIA in the last 168 hours. Coagulation Profile: No results for input(s): INR, PROTIME in the last 168 hours. Cardiac Enzymes: No results for input(s): CKTOTAL, CKMB, CKMBINDEX, TROPONINI in the last 168 hours. BNP (last 3 results) No results for input(s): PROBNP in the last 8760 hours. HbA1C: No results for input(s): HGBA1C in the last 72 hours. CBG: No results for input(s): GLUCAP in the last 168 hours. Lipid Profile: No results for input(s): CHOL, HDL, LDLCALC, TRIG, CHOLHDL, LDLDIRECT in the last 72 hours. Thyroid Function Tests: No results for input(s): TSH, T4TOTAL, FREET4, T3FREE, THYROIDAB in the last 72 hours. Anemia Panel: No results for input(s): VITAMINB12, FOLATE, FERRITIN, TIBC, IRON, RETICCTPCT in the last 72 hours.  Sepsis Labs: Recent Labs  Lab 06/02/21 0110  PROCALCITON <0.10    Recent Results (from the past 240 hour(s))  Blood culture (routine x 2)     Status: Abnormal   Collection Time:  05/28/21 10:56 AM   Specimen: BLOOD  Result Value Ref Range Status   Specimen Description   Final    BLOOD RIGHT ANTECUBITAL Performed  at [facility], [address], [place], [place] [address]    Special Requests   Final    BOTTLES DRAWN AEROBIC AND ANAEROBIC Blood Culture adequate volume Performed at [facility], [address] [place], [place] [address]    Culture  Setup Time   Final    GRAM NEGATIVE COCCOBACILLI AEROBIC BOTTLE ONLY CRITICAL VALUE NOTED.  VALUE IS CONSISTENT WITH PREVIOUSLY REPORTED AND CALLED VALUE.    Culture (A)  Final    ACINETOBACTER CALCOACETICUS/BAUMANNII COMPLEX SUSCEPTIBILITIES PERFORMED ON PREVIOUS CULTURE WITHIN THE LAST 5 DAYS. Performed at [facility], [place] [address], [place], [place] [address]    Report Status 05/31/2021 FINAL  Final  Blood culture (routine x 2)     Status: Abnormal   Collection Time: 05/28/21 10:57 AM   Specimen: BLOOD  Result Value Ref Range Status   Specimen Description   Final    BLOOD BLOOD LEFT FOREARM Performed at [facility], [address] [place], [place] [address]    Special Requests   Final    BOTTLES DRAWN AEROBIC AND ANAEROBIC Blood Culture results may not be optimal due to an inadequate volume of blood received in culture bottles Performed at [facility], [address] [place], [place] [address]    Culture  Setup Time   Final    Organism ID to follow GRAM NEGATIVE COCCOBACILLI IN BOTH AEROBIC AND ANAEROBIC BOTTLES CRITICAL RESULT CALLED TO, READ BACK BY AND VERIFIED WITH: AUSTIN REEVES @ 2327 05/28/21 LFD Performed at [facility], [address] [place], [place] [address]    Culture ACINETOBACTER CALCOACETICUS/BAUMANNII COMPLEX (A)  Final   Report Status 05/31/2021 FINAL  Final   Organism ID, Bacteria ACINETOBACTER CALCOACETICUS/BAUMANNII COMPLEX  Final      Susceptibility   Acinetobacter calcoaceticus/baumannii complex - MIC*     CEFTAZIDIME 4 SENSITIVE Sensitive     CIPROFLOXACIN >=4 RESISTANT Resistant     GENTAMICIN 2 SENSITIVE Sensitive     IMIPENEM <=0.25 SENSITIVE Sensitive     PIP/TAZO <=4 SENSITIVE Sensitive     TRIMETH/SULFA <=20 SENSITIVE Sensitive     AMPICILLIN/SULBACTAM <=2 SENSITIVE Sensitive     * ACINETOBACTER CALCOACETICUS/BAUMANNII COMPLEX  Blood Culture ID Panel (Reflexed)     Status: Abnormal (Preliminary result)   Collection Time: 05/28/21 10:57 AM  Result Value Ref Range Status   Enterococcus faecalis NOT DETECTED NOT DETECTED Final   Enterococcus Faecium NOT DETECTED NOT DETECTED Final   Listeria monocytogenes NOT DETECTED NOT DETECTED Final   Staphylococcus species NOT DETECTED NOT DETECTED Final   Staphylococcus aureus (BCID) PENDING NOT DETECTED Incomplete   Staphylococcus epidermidis NOT DETECTED NOT DETECTED Final   Staphylococcus lugdunensis NOT DETECTED NOT DETECTED Final   Streptococcus species NOT DETECTED NOT DETECTED Final   Streptococcus agalactiae NOT DETECTED NOT DETECTED Final   Streptococcus pneumoniae NOT DETECTED NOT DETECTED Final   Streptococcus pyogenes NOT DETECTED NOT DETECTED Final   A.calcoaceticus-baumannii DETECTED (A) NOT DETECTED Final    Comment: CRITICAL RESULT CALLED TO, READ BACK BY AND VERIFIED WITH: AUSTIN REEVES @ 2327 05/28/21 LFD    Bacteroides fragilis NOT DETECTED NOT DETECTED Final   Enterobacterales NOT DETECTED NOT DETECTED Final   Enterobacter cloacae complex NOT DETECTED NOT DETECTED Final   Escherichia coli NOT DETECTED NOT DETECTED Final   Klebsiella aerogenes NOT DETECTED NOT DETECTED Final   Klebsiella oxytoca NOT DETECTED NOT DETECTED Final   Klebsiella pneumoniae NOT DETECTED NOT DETECTED Final  Proteus species NOT DETECTED NOT DETECTED Final   Salmonella species NOT DETECTED NOT DETECTED Final   Serratia marcescens NOT DETECTED NOT DETECTED Final   Haemophilus influenzae NOT DETECTED NOT DETECTED Final   Neisseria meningitidis NOT  DETECTED NOT DETECTED Final   Pseudomonas aeruginosa NOT DETECTED NOT DETECTED Final   Stenotrophomonas maltophilia NOT DETECTED NOT DETECTED Final   Candida albicans NOT DETECTED NOT DETECTED Final   Candida auris NOT DETECTED NOT DETECTED Final   Candida glabrata NOT DETECTED NOT DETECTED Final   Candida krusei NOT DETECTED NOT DETECTED Final   Candida parapsilosis NOT DETECTED NOT DETECTED Final   Candida tropicalis NOT DETECTED NOT DETECTED Final   Cryptococcus neoformans/gattii NOT DETECTED NOT DETECTED Final   CTX-M ESBL NOT DETECTED NOT DETECTED Final   Carbapenem resistance IMP NOT DETECTED NOT DETECTED Final   Carbapenem resistance KPC NOT DETECTED NOT DETECTED Final   Carbapenem resistance NDM NOT DETECTED NOT DETECTED Final   Carbapenem resistance VIM NOT DETECTED NOT DETECTED Final    Comment: Performed at [facility] [address] [place], [place] [address]  Resp Panel by RT-PCR (Flu A&B, Covid) Nasopharyngeal Swab     Status: None   Collection Time: 05/28/21 12:46 PM   Specimen: Nasopharyngeal Swab; Nasopharyngeal(NP) swabs in vial transport medium  Result Value Ref Range Status   SARS Coronavirus 2 by RT PCR NEGATIVE NEGATIVE Final    Comment: (NOTE) SARS-CoV-2 target nucleic acids are NOT DETECTED.  The SARS-CoV-2 RNA is generally detectable in upper respiratory specimens during the acute phase of infection. The lowest concentration of SARS-CoV-2 viral copies this assay can detect is 138 copies/mL. A negative result does not preclude SARS-Cov-2 infection and should not be used as the sole basis for treatment or other patient management decisions. A negative result may occur with  improper specimen collection/handling, submission of specimen other than nasopharyngeal swab, presence of viral mutation(s) within the areas targeted by this assay, and inadequate number of viral copies(<138 copies/mL). A negative result must be combined with clinical  observations, patient history, and epidemiological information. The expected result is Negative.  Fact Sheet for Patients:  EntrepreneurPulse.com.au  Fact Sheet for Healthcare Providers:  IncredibleEmployment.be  This test is no t yet approved or cleared by the [place] FDA and  has been authorized for detection and/or diagnosis of SARS-CoV-2 by FDA under an Emergency Use Authorization (EUA). This EUA will remain  in effect (meaning this test can be used) for the duration of the COVID-19 declaration under Section 564(b)(1) of the Act, 21 U.S.C.section 360bbb-3(b)(1), unless the authorization is terminated  or revoked sooner.       Influenza A by PCR NEGATIVE NEGATIVE Final   Influenza B by PCR NEGATIVE NEGATIVE Final    Comment: (NOTE) The Xpert Xpress SARS-CoV-2/FLU/RSV plus assay is intended as an aid in the diagnosis of influenza from Nasopharyngeal swab specimens and should not be used as a sole basis for treatment. Nasal washings and aspirates are unacceptable for Xpert Xpress SARS-CoV-2/FLU/RSV testing.  Fact Sheet for Patients: EntrepreneurPulse.com.au  Fact Sheet for Healthcare Providers: IncredibleEmployment.be  This test is not yet approved or cleared by the [place] FDA and has been authorized for detection and/or diagnosis of SARS-CoV-2 by FDA under an Emergency Use Authorization (EUA). This EUA will remain in effect (meaning this test can be used) for the duration of the COVID-19 declaration under Section 564(b)(1) of the Act, 21 U.S.C. section 360bbb-3(b)(1), unless the authorization is terminated or revoked.  Performed at [facility], [address] [place], [place] [address]   CULTURE, BLOOD (ROUTINE X 2) w Reflex to ID Panel     Status: None (Preliminary result)   Collection Time: 06/02/21 12:59 AM   Specimen: BLOOD  Result Value Ref Range Status   Specimen  Description BLOOD RIGHT HAND  Final   Special Requests   Final    BOTTLES DRAWN AEROBIC AND ANAEROBIC Blood Culture adequate volume   Culture   Final    NO GROWTH 3 DAYS Performed at [facility], [address], [place], [place] [address]    Report Status PENDING  Incomplete  CULTURE, BLOOD (ROUTINE X 2) w Reflex to ID Panel     Status: None (Preliminary result)   Collection Time: 06/02/21  1:10 AM   Specimen: BLOOD  Result Value Ref Range Status   Specimen Description BLOOD RIGHT ASSIST CONTROL  Final   Special Requests   Final    BOTTLES DRAWN AEROBIC AND ANAEROBIC Blood Culture adequate volume   Culture   Final    NO GROWTH 3 DAYS Performed at [facility], [address] [place], [place] [address]    Report Status PENDING  Incomplete  Aerobic Culture w Gram Stain (superficial specimen)     Status: None (Preliminary result)   Collection Time: 06/02/21  1:27 PM   Specimen: KNEE; Wound  Result Value Ref Range Status   Specimen Description   Final    KNEE Performed at [facility], [address], [place], [place] [address]    Special Requests   Final    NONE Performed at [facility], [address] [place], [place] [address]    Gram Stain   Final    NO SQUAMOUS EPITHELIAL CELLS PRESENT FEW WBC PRESENT, PREDOMINANTLY MONONUCLEAR RARE GRAM POSITIVE COCCI    Culture   Final    RARE STAPHYLOCOCCUS AUREUS SUSCEPTIBILITIES TO FOLLOW Performed at [facility], [place] [address], [place],  [address]    Report Status PENDING  Incomplete      Imaging Studies   No results found.   Medications   Scheduled Meds:  atorvastatin  20 mg Oral QHS   bisoprolol  10 mg Oral BID   collagenase   Topical Daily   diltiazem  60 mg Oral Q8H   escitalopram  20 mg Oral Daily   famotidine  20 mg Oral QHS   ferrous gluconate  324 mg Oral Weekly   levalbuterol  1.25 mg Nebulization TID   linezolid  600 mg Oral Q12H   loratadine  10  mg Oral QHS   LORazepam  1 mg Oral QHS   mometasone-formoterol  2 puff Inhalation BID   And   umeclidinium bromide  1 puff Inhalation Daily   montelukast  10 mg Oral QHS   predniSONE  20 mg Oral Q breakfast   Continuous Infusions:  sodium chloride Stopped (05/28/21 1600)   ampicillin-sulbactam (UNASYN) IV 3 g (06/05/21 1814)       LOS: 8 days     Enzo Bi, md [facility]  06/05/2021, 6:20 PM

## 2021-06-05 NOTE — Progress Notes (Signed)
Occupational Therapy Treatment Patient Details Name: Paula Torres MRN: [id number] DOB: [birth year] Today's Date: 06/05/2021   History of present illness 70 y.o. female with medical history significant of dCHF, HLD, COPD on 3 L oxygen, GERD, depression, PAF on Eliquis, pulmonary HTN, OSA on BiPAP, CAD, iron deficiency anemia, CKD-3A, who presents with left leg wound with bleeding and pain.   OT comments  Upon entering the room, pt seated in recliner chair and is eating snacks. Pt agreeable to OT intervention. She requests to stay up in recliner chair at end of session. Pt standing from recliner chair x 2 reps with min A. Pt's HR elevated to 125 bpm with sit <>stand. OT then noted pt to bleeding from L LE wound and RN notified. OT repositioning pt for dressing change and chair alarm activated.    Recommendations for follow up therapy are one component of a multi-disciplinary discharge planning process, led by the attending physician.  Recommendations may be updated based on patient status, additional functional criteria and insurance authorization.    Follow Up Recommendations  SNF    Equipment Recommendations  3 in 1 bedside commode       Precautions / Restrictions Precautions Precautions: Fall Precaution Comments: monitor O2 sats, HR Restrictions Other Position/Activity Restrictions: BLE L>R wounds       Mobility Bed Mobility               General bed mobility comments: seated in recliner chair    Transfers Overall transfer level: Needs assistance Equipment used: Rolling walker (2 wheeled) Transfers: Sit to/from [facility] Sit to Stand: Min assist         General transfer comment: cuing for technique and hand placement    Balance Overall balance assessment: Needs assistance Sitting-balance support: Feet supported;No upper extremity supported Sitting balance-Leahy Scale: Good     Standing balance support: Bilateral upper extremity  supported Standing balance-Leahy Scale: Poor Standing balance comment: B UE support required                           ADL either performed or assessed with clinical judgement     Vision Patient Visual Report: No change from baseline            Cognition Arousal/Alertness: Awake/alert Behavior During Therapy: WFL for tasks assessed/performed Overall Cognitive Status: Within Functional Limits for tasks assessed                                                     Pertinent Vitals/ Pain       Pain Assessment: Faces Faces Pain Scale: Hurts little more Pain Location: L LE Pain Descriptors / Indicators: Grimacing;Moaning;Aching Pain Intervention(s): Monitored during session;Repositioned         Frequency  Min 2X/week        Progress Toward Goals  OT Goals(current goals can now be found in the care plan section)  Progress towards OT goals: Progressing toward goals  Acute Rehab OT Goals Patient Stated Goal: to go home OT Goal Formulation: With patient Time For Goal Achievement: 06/14/2021 Potential to Achieve Goals: Good  Plan Discharge plan remains appropriate       AM-PAC OT "6 Clicks" Daily Activity     Outcome Measure   Help from another person eating meals?: None Help  from another person taking care of personal grooming?: None Help from another person toileting, which includes using toliet, bedpan, or urinal?: A Lot Help from another person bathing (including washing, rinsing, drying)?: A Lot Help from another person to put on and taking off regular upper body clothing?: A Little Help from another person to put on and taking off regular lower body clothing?: A Lot 6 Click Score: 17    End of Session Equipment Utilized During Treatment: Rolling walker;Oxygen  OT Visit Diagnosis: Other abnormalities of gait and mobility (R26.89);Repeated falls (R29.6);Muscle weakness (generalized) (M62.81)   Activity Tolerance Patient  tolerated treatment well   Patient Left in bed;with call bell/phone within reach   Nurse Communication Other (comment) (active bleeding on L LE)        Time: 5947-0761 OT Time Calculation (min): 25 min  Charges: OT General Charges $OT Visit: 1 Visit OT Treatments $Therapeutic Activity: 23-37 mins  Darleen Crocker, MS, OTR/L , CBIS ascom [phone]  06/05/21, 4:31 PM

## 2021-06-05 NOTE — Progress Notes (Signed)
Patient with wound to left knee and BLE.  Dressings removed, BLE and left knee wounds cleansed with soap and water.  Santyl, saline moistened gauze and foam dressing applied to left knee.  Xeroform, kerlix and coban applied to BLE.  Patient tolerated wound care without complaints.  Will continue to monitor.

## 2021-06-05 NOTE — Progress Notes (Signed)
   Date of Admission:  05/28/2021    ID: Paula Torres is a 69 y.o. female  Principal Problem:   Wound infection_left lower leg Active Problems:   Hyperlipidemia   COPD exacerbation ([place])   Pulmonary hypertension ([place])   Depression   Acute on chronic respiratory failure with hypoxia (HCC)   AF (paroxysmal atrial fibrillation) (HCC)   Severe sepsis (HCC)   Chronic diastolic CHF (congestive heart failure) (HCC)   Acute renal failure superimposed on stage 3a chronic kidney disease (HCC)   Hyponatremia   Hypokalemia   Iron deficiency anemia   Cellulitis of left lower leg    Subjective: Feeling better overall Has some bleeding from the left knee wound No drowsiness Appetite better Says she wants to go home  Medications:   atorvastatin  20 mg Oral QHS   bisoprolol  10 mg Oral BID   collagenase   Topical Daily   diltiazem  60 mg Oral Q8H   escitalopram  20 mg Oral Daily   famotidine  20 mg Oral QHS   ferrous gluconate  324 mg Oral Weekly   levalbuterol  1.25 mg Nebulization TID   linezolid  600 mg Oral Q12H   loratadine  10 mg Oral QHS   LORazepam  1 mg Oral QHS   mometasone-formoterol  2 puff Inhalation BID   And   umeclidinium bromide  1 puff Inhalation Daily   montelukast  10 mg Oral QHS   predniSONE  20 mg Oral Q breakfast    Objective: Vital signs in last 24 hours: Temp:  [97.7 F (36.5 C)-98.3 F (36.8 C)] 98 F (36.7 C) (10/03 1112) Pulse Rate:  [78-109] 92 (10/03 1112) Resp:  [18-20] 18 (10/03 1112) BP: (101-110)/(47-96) 101/53 (10/03 1112) SpO2:  [89 %-95 %] 91 % (10/03 1458) Weight:  [595 kg] 137 kg (10/03 0500)  PHYSICAL EXAM:  General: Alert, cooperative, no distress,on nasal cannula Lungs: b/la ir entry Heart:s1s2 Abdomen:did not examine  Extremities: rt knee dressing removed  Wound covered by eschar- surrounding ecchymosis, bruising Skin: left leg bruising Edema both legs Lymph: Cervical, supraclavicular normal. Neurologic: Grossly  non-focal  Lab Results Recent Labs    06/04/21 0638 06/05/21 0422  WBC 26.9* 22.8*  HGB 9.8* 8.5*  HCT 32.0* 27.7*  NA 140 136  K 4.3 3.9  CL 90* 93*  CO2 37* 36*  BUN 42* 37*  CREATININE 1.39* 1.23*   Microbiology: 05/28/21 Blood culture acinetobacter 06/02/21- BC NG 06/02/21 wound culture staph aureus     Assessment/Plan:  Acinetobacter bacteremia- on unasyn  day 8- will need for 2 more days( 06/07/21)  Knee wound-following fall - covered with eschar- getting santyl- seen by ortho- no surgery- staph aureus in culture- susceptibility pending- will add linezolid as she has leucocytosis  COPD/OSA/obesity hypoventilation syndrome/ Cor pulmonal  Encephalopathy due to co2 narcosis has resolved  Anemia  Paroxysmal afib  Discussed the management with patient and daughter and her nurse

## 2021-06-06 DIAGNOSIS — R0689 Other abnormalities of breathing: Secondary | ICD-10-CM

## 2021-06-06 DIAGNOSIS — G4733 Obstructive sleep apnea (adult) (pediatric): Secondary | ICD-10-CM

## 2021-06-06 DIAGNOSIS — G9340 Encephalopathy, unspecified: Secondary | ICD-10-CM | POA: Diagnosis not present

## 2021-06-06 DIAGNOSIS — I480 Paroxysmal atrial fibrillation: Secondary | ICD-10-CM | POA: Diagnosis not present

## 2021-06-06 DIAGNOSIS — L089 Local infection of the skin and subcutaneous tissue, unspecified: Secondary | ICD-10-CM | POA: Diagnosis not present

## 2021-06-06 DIAGNOSIS — T148XXA Other injury of unspecified body region, initial encounter: Secondary | ICD-10-CM | POA: Diagnosis not present

## 2021-06-06 LAB — CBC
HCT: 25.9 % — ABNORMAL LOW (ref 36.0–46.0)
Hemoglobin: 7.7 g/dL — ABNORMAL LOW (ref 12.0–15.0)
MCH: 26.6 pg (ref 26.0–34.0)
MCHC: 29.7 g/dL — ABNORMAL LOW (ref 30.0–36.0)
MCV: 89.3 fL (ref 80.0–100.0)
Platelets: 254 10*3/uL (ref 150–400)
RBC: 2.9 MIL/uL — ABNORMAL LOW (ref 3.87–5.11)
RDW: 17.7 % — ABNORMAL HIGH (ref 11.5–15.5)
WBC: 15.9 10*3/uL — ABNORMAL HIGH (ref 4.0–10.5)
nRBC: 0 % (ref 0.0–0.2)

## 2021-06-06 LAB — BLOOD GAS, ARTERIAL
Acid-Base Excess: 16.2 mmol/L — ABNORMAL HIGH (ref 0.0–2.0)
Bicarbonate: 42.5 mmol/L — ABNORMAL HIGH (ref 20.0–28.0)
FIO2: 0.36
O2 Saturation: 95.4 %
Patient temperature: 37
pCO2 arterial: 64 mmHg — ABNORMAL HIGH (ref 32.0–48.0)
pH, Arterial: 7.43 (ref 7.350–7.450)
pO2, Arterial: 76 mmHg — ABNORMAL LOW (ref 83.0–108.0)

## 2021-06-06 LAB — BASIC METABOLIC PANEL
Anion gap: 6 (ref 5–15)
BUN: 32 mg/dL — ABNORMAL HIGH (ref 8–23)
CO2: 37 mmol/L — ABNORMAL HIGH (ref 22–32)
Calcium: 8.1 mg/dL — ABNORMAL LOW (ref 8.9–10.3)
Chloride: 94 mmol/L — ABNORMAL LOW (ref 98–111)
Creatinine, Ser: 1.27 mg/dL — ABNORMAL HIGH (ref 0.44–1.00)
GFR, Estimated: 46 mL/min — ABNORMAL LOW (ref >60)
Glucose, Bld: 117 mg/dL — ABNORMAL HIGH (ref 70–99)
Potassium: 3.7 mmol/L (ref 3.5–5.1)
Sodium: 137 mmol/L (ref 135–145)

## 2021-06-06 LAB — AEROBIC CULTURE W GRAM STAIN (SUPERFICIAL SPECIMEN)

## 2021-06-06 LAB — MAGNESIUM: Magnesium: 2.2 mg/dL (ref 1.7–2.4)

## 2021-06-06 MED ORDER — HALOPERIDOL LACTATE 5 MG/ML IJ SOLN
1.0000 mg | Freq: Four times a day (QID) | INTRAMUSCULAR | Status: AC | PRN
Start: 2021-06-06 — End: 2021-06-06
  Administered 2021-06-06 (×2): 1 mg via INTRAVENOUS
  Filled 2021-06-06 (×2): qty 1

## 2021-06-06 MED ORDER — TRAMADOL HCL 50 MG PO TABS
50.0000 mg | ORAL_TABLET | Freq: Four times a day (QID) | ORAL | Status: DC | PRN
  Administered 2021-06-06: 50 mg via ORAL
  Filled 2021-06-06: qty 1

## 2021-06-06 NOTE — Progress Notes (Signed)
PT Cancellation Note  Patient Details Name: Paula Torres MRN: [id number] DOB: 1951/06/05   Cancelled Treatment:    Reason Eval/Treat Not Completed: Other (comment). Pt chart reviewed. Upon arrival, RN x3 were in room cleaning up pt and pt attempting to exit bed. RN reports limited command following today and increased confusion - meds were increased overnight with pt continuing to present with AMS. Will follow up when  appropriate.    Patrina Levering PT, DPT 06/06/21 10:56 AM [phone]

## 2021-06-06 NOTE — Consult Note (Addendum)
[facility] Nurse re-consult Note: Reason for Consult: Refer to initial [place] consult, which was performed on 9/26; topical treatment orders were provided at that time.  Ortho team performed a consult on 9/30 for left knee and did not recommend surgical debridement.   Requested to reassess left knee today. Left knee with full thickness wound; 100% tightly adhered black eschar.  Previously noted as 2 separate wounds seperated by a skin bridge, they have now merged into one wound; 9X10cm.  There is no fluctuance when probed with a swab, but there is a  small amt bloody drainage from wound edge pinhole opening. Generalized edema surrounding the wound location. Bilat legs without wounds or drainage. Wound type: Dressing procedure/placement/frequency: Continue current plan of care.  Topical treatment orders provided for bedside nurses to perform as follows: Apply Santyl Q day to left knee in a 1/8 inch layer after cleansing. Top with a normal saline moistened gauze dressing, then ABD pads and kerlex. Pt could benefit from follow-up at the outpatient wound care center after discharge; primary team, please order if desired. Please re-consult if further assistance is needed.  Thank-you,  Julien Girt MSN, [place], [place], [place], [place]

## 2021-06-06 NOTE — Progress Notes (Addendum)
PROGRESS NOTE    Paula Torres   SVX:[id number]  DOB: Dec 14, 1950  PCP: Rusty Aus, MD    DOA: 05/28/2021 LOS: 9    Brief Narrative / Hospital Course to Date:   70 year old female with past medical history of diastolic CHF, hyperlipidemia, COPD chronically on 3 L/min supplemental oxygen, GERD, depression, paroxysmal A. fib on Eliquis, pulmonary hypertension, OSA noncompliant with BiPAP, iron deficiency anemia, CKD stage IIIa who presented to the ED on 05/28/2021 with progressive leg pain with bleeding and oozing after a mechanical fall 3 days prior.  Evaluation in the ED revealed severe sepsis secondary to left lower extremity wound infection and cellulitis.  Admitted to [facility] service and started on empiric IV antibiotics pending cultures.  Patient also with signs of acute COPD exacerbation started on IV steroids and bronchodilators.  Assessment & Plan   Principal Problem:   Wound infection_left lower leg Active Problems:   Hyperlipidemia   COPD exacerbation (HCC)   Pulmonary hypertension (HCC)   Depression   Acute on chronic respiratory failure with hypoxia (HCC)   AF (paroxysmal atrial fibrillation) (HCC)   Severe sepsis (HCC)   Chronic diastolic CHF (congestive heart failure) (HCC)   Acute renal failure superimposed on stage 3a chronic kidney disease (HCC)   Hyponatremia   Hypokalemia   Iron deficiency anemia   Cellulitis of left lower leg   Severe sepsis secondary to # left lower extremity cellulitis  # MSSA infection of left knee wound # secondary acinetobacter bacteremia  -patient met criteria for severe sepsis on admission with leukocytosis, tachypnea, lactic acidosis consistent with organ dysfunction.  Treated per sepsis protocol in the ED. Blood cultures growing acinetobacter baumannii LLE doppler U/S negative for DVT --ID consulted --ortho consulted on 9/30, rec local wound care Plan: --cont Unasyn for 10-day course (d/c tomorrow). --after completion of  unasyn can start cefadroxil PO for 5-7 days, per ID --wound care and dressing change per orders  Acute on chronic respiratory failure with hypoxia and hypercapnia secondary to COPD with acute exacerbation  3L O2 at baseline Has advanced COPD and BiPAP recommended but patient states does not tolerate. 9/27 - pt more lethargic, ABG with pCO2 80 (up from 50).   Started on BiPAP (requires mittens to keep it on), transfer to stepdown. PCCM consulted. --transitioned from IV solumedrol to home prednisone 20 mg daily on 9/30 Plan: --cont home prednisone 20 mg daily --Continue bronchodilators --Continue supplemental O2 to keep sats between 88-92%, wean as tolerated --BiPAP nightly  Hyponatremia  -present with sodium 129, likely due to poor p.o. intake, dehydration.   --started on salt tablet, d/c'ed on 10/1  Paroxysmal A. Fib, with RVR --developed RVR night of 9/28 --cont bisoprolol to 10 mg BID --cont dilt 60 mg q8h --hold Eliquis due to wound bleeding  Chronic diastolic CHF  -prior echo on 01/21/2020 with EF 60 to 65%.  Patient volume status difficult to assess on exam due to morbid obesity with chest x-ray was without any pulmonary edema.  Appears overall compensated. --Lasix held on admission due to worsened renal function and hyponatremia --IV lasix 40 mg x1 on 10/1, with subsequent Cr bump --Hold further IV diuresis  AKI, POA, resolved on CKD stage IIIa  -creatinine appears 1.0-1.2.  Presented with creatinine 2.11 and BUN 64, likely due to dehydration with severe sepsis in addition to being on Lasix.  She was treated with IV fluids in the ED.  Pulmonary hypertension  -continue treprostinil  Depression  -continue Lexapro  Hyperlipidemia  -continue Lipitor  Acute on chronic iron deficiency anemia -hemoglobin in August was 10.3, presented with hemoglobin 7.9.  Suspect blood loss with wound bleeding.   --1u pRBC on 9/27 for Hbg 6.8 Plan: --hold Eliquis --cont iron  supplement --transfuse if Hgb <7  Intermittent encephalopathy due to hypercapnia OSA and COPD Non-compliance with BiPAP -does not tolerate BiPAP at home or here.  During current hospitalization, pt becomes somnolent and unresponsive every 3 days or so, and then wakes up next day after BiPAP use.   --despite multiple discussion and encouragement about using BiPAP, pt still could not tolerate.  Due to this, previous attending had consulted palliative.  My attempts to add ativan and/or Seroquel with BiPAP wear had not been successful.  This issue will likely bring pt recurrent admissions. --cont to try BiPAP nightly.    Leukocytosis, persistent --maybe due to IV solumedrol.  Procal trended down to <0.1.  No fever.   --Monitor WBC   Morbid obesity: Body mass index is 50.26 kg/m.  Complicates overall care and prognosis.  Recommend lifestyle modifications including physical activity and diet for weight loss and overall long-term health.   DVT prophylaxis: SCD/Compression stockings Code Status: Full code  Family Communication:  Status is: inpatient Dispo:   The patient is from: home Anticipated d/c is to: SNF Anticipated d/c date is: undetermined  Patient currently is not medically stable to d/c due to: pt couldn't tolerate BiPAP and goes into comatosed state every 3-4 days   Subjective 06/06/21    Overnight, pt was confused and agitated, didn't get her BiPAP on for long.  Intermittent agitation and confusion in the morning also, however, somnolent and sleeping the rest of the time.     Consults, Procedures, Significant Events   Consultants:  None  Procedures:  None  Antimicrobials:  Anti-infectives (From admission, onward)    Start     Dose/Rate Route Frequency Ordered Stop   06/05/21 1600  linezolid (ZYVOX) tablet 600 mg  Status:  Discontinued        600 mg Oral Every 12 hours 06/05/21 1503 06/06/21 1623   05/29/21 1100  cefTRIAXone (ROCEPHIN) 2 g in sodium chloride 0.9  % 100 mL IVPB  Status:  Discontinued        2 g 200 mL/hr over 30 Minutes Intravenous Every 24 hours 05/28/21 1244 05/29/21 0248   05/29/21 1000  Ampicillin-Sulbactam (UNASYN) 3 g in sodium chloride 0.9 % 100 mL IVPB        3 g 200 mL/hr over 30 Minutes Intravenous Every 6 hours 05/29/21 0825 06/07/21 2359   05/29/21 0400  Ampicillin-Sulbactam (UNASYN) 3 g in sodium chloride 0.9 % 100 mL IVPB  Status:  Discontinued        3 g 200 mL/hr over 30 Minutes Intravenous Every 12 hours 05/29/21 0251 05/29/21 0353   05/29/21 0400  Ampicillin-Sulbactam (UNASYN) 3 g in sodium chloride 0.9 % 100 mL IVPB  Status:  Discontinued        3 g 200 mL/hr over 30 Minutes Intravenous Every 8 hours 05/29/21 0353 05/29/21 0825   05/28/21 1309  vancomycin variable dose per unstable renal function (pharmacist dosing)  Status:  Discontinued         Does not apply See admin instructions 05/28/21 1309 05/29/21 0258   05/28/21 1200  vancomycin (VANCOREADY) IVPB 1500 mg/300 mL        1,500 mg 150 mL/hr over 120 Minutes Intravenous  Once 05/28/21 1102 05/28/21 1657  05/28/21 1100  vancomycin (VANCOCIN) IVPB 1000 mg/200 mL premix        1,000 mg 200 mL/hr over 60 Minutes Intravenous  Once 05/28/21 1056 05/28/21 1554   05/28/21 1100  cefTRIAXone (ROCEPHIN) 2 g in sodium chloride 0.9 % 100 mL IVPB        2 g 200 mL/hr over 30 Minutes Intravenous  Once 05/28/21 1056 05/28/21 1553         Micro    Objective   Vitals:   06/06/21 0036 06/06/21 0100 06/06/21 0802 06/06/21 1640  BP: 108/70  108/74 103/68  Pulse: (!) 102  79 (!) 103  Resp:  _0 Temp:  97.7 F (36.5 C) 97.8 F (36.6 C) 98 F (36.7 C)  TempSrc:      SpO2: 94%   92%  Weight:      Height:        Intake/Output Summary (Last 24 hours) at 06/06/2021 1906 Last data filed at 06/06/2021 0036 Gross per 24 hour  Intake --  Output 648 ml  Net -648 ml    Filed Weights   06/02/21 0500 06/04/21 0500 06/05/21 0500  Weight: 131.5 kg 130.8 kg (!)  137 kg    Physical Exam:  Constitutional: NAD, somnolent, difficult to wake CV: No cyanosis.   RESP: normal respiratory effort, on 3L Extremities: bandage over left knee, both legs wrapped SKIN: warm, dry, extensive bruising   Labs   Data Reviewed: I have personally reviewed following labs and imaging studies  CBC: Recent Labs  Lab 06/02/21 0110 06/03/21 0505 06/04/21 0638 06/05/21 0422 06/06/21 0635  WBC 24.5* 26.2* 26.9* 22.8* 15.9*  HGB 8.7* 9.6* 9.8* 8.5* 7.7*  HCT 29.1* 31.1* 32.0* 27.7* 25.9*  MCV 89.8 90.9 90.4 88.8 89.3  PLT 476* 479* 467* 351 480   Basic Metabolic Panel: Recent Labs  Lab 06/02/21 0110 06/03/21 0505 06/04/21 0638 06/05/21 0422 06/06/21 0635  NA 137 140 140 136 137  K 4.5 4.3 4.3 3.9 3.7  CL 90* 92* 90* 93* 94*  CO2 36* 39* 37* 36* 37*  GLUCOSE 229* 143* 172* 139* 117*  BUN 44* 40* 42* 37* 32*  CREATININE 1.25* 1.14* 1.39* 1.23* 1.27*  CALCIUM 8.9 9.0 9.1 8.4* 8.1*  MG 2.4 2.4 2.3 2.2 2.2   GFR: Estimated Creatinine Clearance: 58.7 mL/min (A) (by C-G formula based on SCr of 1.27 mg/dL (H)). Liver Function Tests: No results for input(s): AST, ALT, ALKPHOS, BILITOT, PROT, ALBUMIN in the last 168 hours.  No results for input(s): LIPASE, AMYLASE in the last 168 hours. No results for input(s): AMMONIA in the last 168 hours. Coagulation Profile: No results for input(s): INR, PROTIME in the last 168 hours. Cardiac Enzymes: No results for input(s): CKTOTAL, CKMB, CKMBINDEX, TROPONINI in the last 168 hours. BNP (last 3 results) No results for input(s): PROBNP in the last 8760 hours. HbA1C: No results for input(s): HGBA1C in the last 72 hours. CBG: No results for input(s): GLUCAP in the last 168 hours. Lipid Profile: No results for input(s): CHOL, HDL, LDLCALC, TRIG, CHOLHDL, LDLDIRECT in the last 72 hours. Thyroid Function Tests: No results for input(s): TSH, T4TOTAL, FREET4, T3FREE, THYROIDAB in the last 72 hours. Anemia Panel: No  results for input(s): VITAMINB12, FOLATE, FERRITIN, TIBC, IRON, RETICCTPCT in the last 72 hours.  Sepsis Labs: Recent Labs  Lab 06/02/21 0110  PROCALCITON <0.10    Recent Results (from the past 240 hour(s))  Blood culture (routine x 2)  Status: Abnormal   Collection Time: 05/28/21 10:56 AM   Specimen: BLOOD  Result Value Ref Range Status   Specimen Description   Final    BLOOD RIGHT ANTECUBITAL Performed at [facility], [address], [place], [place] [address]    Special Requests   Final    BOTTLES DRAWN AEROBIC AND ANAEROBIC Blood Culture adequate volume Performed at [facility], [address] [place], [place] [address]    Culture  Setup Time   Final    GRAM NEGATIVE COCCOBACILLI AEROBIC BOTTLE ONLY CRITICAL VALUE NOTED.  VALUE IS CONSISTENT WITH PREVIOUSLY REPORTED AND CALLED VALUE.    Culture (A)  Final    ACINETOBACTER CALCOACETICUS/BAUMANNII COMPLEX SUSCEPTIBILITIES PERFORMED ON PREVIOUS CULTURE WITHIN THE LAST 5 DAYS. Performed at [facility], [place] [address] [place], [place] [address]    Report Status 05/31/2021 FINAL  Final  Blood culture (routine x 2)     Status: Abnormal   Collection Time: 05/28/21 10:57 AM   Specimen: BLOOD  Result Value Ref Range Status   Specimen Description   Final    BLOOD BLOOD LEFT FOREARM Performed at [facility], [address] [place], [place] [address]    Special Requests   Final    BOTTLES DRAWN AEROBIC AND ANAEROBIC Blood Culture results may not be optimal due to an inadequate volume of blood received in culture bottles Performed at [facility], [address] [place], [place] [address]    Culture  Setup Time   Final    Organism ID to follow GRAM NEGATIVE COCCOBACILLI IN BOTH AEROBIC AND ANAEROBIC BOTTLES CRITICAL RESULT CALLED TO, READ BACK BY AND VERIFIED WITH: AUSTIN REEVES @ 2327 05/28/21 LFD Performed at [facility], [address] [place],  [place] [address]    Culture ACINETOBACTER CALCOACETICUS/BAUMANNII COMPLEX (A)  Final   Report Status 05/31/2021 FINAL  Final   Organism ID, Bacteria ACINETOBACTER CALCOACETICUS/BAUMANNII COMPLEX  Final      Susceptibility   Acinetobacter calcoaceticus/baumannii complex - MIC*    CEFTAZIDIME 4 SENSITIVE Sensitive     CIPROFLOXACIN >=4 RESISTANT Resistant     GENTAMICIN 2 SENSITIVE Sensitive     IMIPENEM <=0.25 SENSITIVE Sensitive     PIP/TAZO <=4 SENSITIVE Sensitive     TRIMETH/SULFA <=20 SENSITIVE Sensitive     AMPICILLIN/SULBACTAM <=2 SENSITIVE Sensitive     * ACINETOBACTER CALCOACETICUS/BAUMANNII COMPLEX  Blood Culture ID Panel (Reflexed)     Status: Abnormal (Preliminary result)   Collection Time: 05/28/21 10:57 AM  Result Value Ref Range Status   Enterococcus faecalis NOT DETECTED NOT DETECTED Final   Enterococcus Faecium NOT DETECTED NOT DETECTED Final   Listeria monocytogenes NOT DETECTED NOT DETECTED Final   Staphylococcus species NOT DETECTED NOT DETECTED Final   Staphylococcus aureus (BCID) PENDING NOT DETECTED Incomplete   Staphylococcus epidermidis NOT DETECTED NOT DETECTED Final   Staphylococcus lugdunensis NOT DETECTED NOT DETECTED Final   Streptococcus species NOT DETECTED NOT DETECTED Final   Streptococcus agalactiae NOT DETECTED NOT DETECTED Final   Streptococcus pneumoniae NOT DETECTED NOT DETECTED Final   Streptococcus pyogenes NOT DETECTED NOT DETECTED Final   A.calcoaceticus-baumannii DETECTED (A) NOT DETECTED Final    Comment: CRITICAL RESULT CALLED TO, READ BACK BY AND VERIFIED WITH: AUSTIN REEVES @ 2327 05/28/21 LFD    Bacteroides fragilis NOT DETECTED NOT DETECTED Final   Enterobacterales NOT DETECTED NOT DETECTED Final   Enterobacter cloacae complex NOT DETECTED NOT DETECTED Final   Escherichia coli NOT  DETECTED NOT DETECTED Final   Klebsiella aerogenes NOT DETECTED NOT DETECTED Final   Klebsiella oxytoca NOT DETECTED NOT DETECTED Final   Klebsiella pneumoniae  NOT DETECTED NOT DETECTED Final   Proteus species NOT DETECTED NOT DETECTED Final   Salmonella species NOT DETECTED NOT DETECTED Final   Serratia marcescens NOT DETECTED NOT DETECTED Final   Haemophilus influenzae NOT DETECTED NOT DETECTED Final   Neisseria meningitidis NOT DETECTED NOT DETECTED Final   Pseudomonas aeruginosa NOT DETECTED NOT DETECTED Final   Stenotrophomonas maltophilia NOT DETECTED NOT DETECTED Final   Candida albicans NOT DETECTED NOT DETECTED Final   Candida auris NOT DETECTED NOT DETECTED Final   Candida glabrata NOT DETECTED NOT DETECTED Final   Candida krusei NOT DETECTED NOT DETECTED Final   Candida parapsilosis NOT DETECTED NOT DETECTED Final   Candida tropicalis NOT DETECTED NOT DETECTED Final   Cryptococcus neoformans/gattii NOT DETECTED NOT DETECTED Final   CTX-M ESBL NOT DETECTED NOT DETECTED Final   Carbapenem resistance IMP NOT DETECTED NOT DETECTED Final   Carbapenem resistance KPC NOT DETECTED NOT DETECTED Final   Carbapenem resistance NDM NOT DETECTED NOT DETECTED Final   Carbapenem resistance VIM NOT DETECTED NOT DETECTED Final    Comment: Performed at [facility], [address] [place], [place] [address]  Resp Panel by RT-PCR (Flu A&B, Covid) Nasopharyngeal Swab     Status: None   Collection Time: 05/28/21 12:46 PM   Specimen: Nasopharyngeal Swab; Nasopharyngeal(NP) swabs in vial transport medium  Result Value Ref Range Status   SARS Coronavirus 2 by RT PCR NEGATIVE NEGATIVE Final    Comment: (NOTE) SARS-CoV-2 target nucleic acids are NOT DETECTED.  The SARS-CoV-2 RNA is generally detectable in upper respiratory specimens during the acute phase of infection. The lowest concentration of SARS-CoV-2 viral copies this assay can detect is 138 copies/mL. A negative result does not preclude SARS-Cov-2 infection and should not be used as the sole basis for treatment or other patient management decisions. A negative result may occur with   improper specimen collection/handling, submission of specimen other than nasopharyngeal swab, presence of viral mutation(s) within the areas targeted by this assay, and inadequate number of viral copies(<138 copies/mL). A negative result must be combined with clinical observations, patient history, and epidemiological information. The expected result is Negative.  Fact Sheet for Patients:  EntrepreneurPulse.com.au  Fact Sheet for Healthcare Providers:  IncredibleEmployment.be  This test is no t yet approved or cleared by the [place] FDA and  has been authorized for detection and/or diagnosis of SARS-CoV-2 by FDA under an Emergency Use Authorization (EUA). This EUA will remain  in effect (meaning this test can be used) for the duration of the COVID-19 declaration under Section 564(b)(1) of the Act, 21 U.S.C.section 360bbb-3(b)(1), unless the authorization is terminated  or revoked sooner.       Influenza A by PCR NEGATIVE NEGATIVE Final   Influenza B by PCR NEGATIVE NEGATIVE Final    Comment: (NOTE) The Xpert Xpress SARS-CoV-2/FLU/RSV plus assay is intended as an aid in the diagnosis of influenza from Nasopharyngeal swab specimens and should not be used as a sole basis for treatment. Nasal washings and aspirates are unacceptable for Xpert Xpress SARS-CoV-2/FLU/RSV testing.  Fact Sheet for Patients: EntrepreneurPulse.com.au  Fact Sheet for Healthcare Providers: IncredibleEmployment.be  This test is not yet approved or cleared by the [place] FDA and has been authorized for detection and/or diagnosis of SARS-CoV-2 by FDA under an Emergency Use Authorization (EUA). This EUA will  remain in effect (meaning this test can be used) for the duration of the COVID-19 declaration under Section 564(b)(1) of the Act, 21 U.S.C. section 360bbb-3(b)(1), unless the authorization is terminated  or revoked.  Performed at [facility], [address] [place], [place] [address]   CULTURE, BLOOD (ROUTINE X 2) w Reflex to ID Panel     Status: None (Preliminary result)   Collection Time: 06/02/21 12:59 AM   Specimen: BLOOD  Result Value Ref Range Status   Specimen Description BLOOD RIGHT HAND  Final   Special Requests   Final    BOTTLES DRAWN AEROBIC AND ANAEROBIC Blood Culture adequate volume   Culture   Final    NO GROWTH 4 DAYS Performed at [facility], [address] [place], [place] [address]    Report Status PENDING  Incomplete  CULTURE, BLOOD (ROUTINE X 2) w Reflex to ID Panel     Status: None (Preliminary result)   Collection Time: 06/02/21  1:10 AM   Specimen: BLOOD  Result Value Ref Range Status   Specimen Description BLOOD RIGHT ASSIST CONTROL  Final   Special Requests   Final    BOTTLES DRAWN AEROBIC AND ANAEROBIC Blood Culture adequate volume   Culture   Final    NO GROWTH 4 DAYS Performed at [facility], [address] [place], [place] [address]    Report Status PENDING  Incomplete  Aerobic Culture w Gram Stain (superficial specimen)     Status: None   Collection Time: 06/02/21  1:27 PM   Specimen: KNEE; Wound  Result Value Ref Range Status   Specimen Description   Final    KNEE Performed at [facility], [address] [place], [place] [address]    Special Requests   Final    NONE Performed at [facility], [address] [place], [place] [address]    Gram Stain   Final    NO SQUAMOUS EPITHELIAL CELLS PRESENT FEW WBC PRESENT, PREDOMINANTLY MONONUCLEAR RARE GRAM POSITIVE COCCI Performed at [facility], [place] [address], [place], [place] [address]    Culture RARE STAPHYLOCOCCUS AUREUS  Final   Report Status 06/06/2021 FINAL  Final   Organism ID, Bacteria STAPHYLOCOCCUS AUREUS  Final      Susceptibility   Staphylococcus aureus - MIC*    CIPROFLOXACIN >=8 RESISTANT Resistant      ERYTHROMYCIN >=8 RESISTANT Resistant     GENTAMICIN <=0.5 SENSITIVE Sensitive     OXACILLIN 0.5 SENSITIVE Sensitive     TETRACYCLINE <=1 SENSITIVE Sensitive     VANCOMYCIN <=0.5 SENSITIVE Sensitive     TRIMETH/SULFA <=10 SENSITIVE Sensitive     CLINDAMYCIN <=0.25 SENSITIVE Sensitive     RIFAMPIN <=0.5 SENSITIVE Sensitive     Inducible Clindamycin NEGATIVE Sensitive     * RARE STAPHYLOCOCCUS AUREUS      Imaging Studies   No results found.   Medications   Scheduled Meds:  atorvastatin  20 mg Oral QHS   bisoprolol  10 mg Oral BID   collagenase   Topical Daily   diltiazem  60 mg Oral Q8H   escitalopram  20 mg Oral Daily   famotidine  20 mg Oral QHS   ferrous gluconate  324 mg Oral Weekly   levalbuterol  1.25 mg Nebulization TID   loratadine  10 mg Oral QHS   mometasone-formoterol  2 puff Inhalation BID   And   umeclidinium bromide  1 puff Inhalation Daily  montelukast  10 mg Oral QHS   predniSONE  20 mg Oral Q breakfast   Continuous Infusions:  sodium chloride Stopped (05/28/21 1600)   ampicillin-sulbactam (UNASYN) IV 3 g (06/06/21 1805)       LOS: 9 days     Enzo Bi, md [facility]  06/06/2021, 7:06 PM

## 2021-06-06 NOTE — Progress Notes (Signed)
Patient very lethargic this morning.  Patient woke to touch and states that she wants to be left alone.  Later bed alarm sounded and patient found to be sitting on the edge of the bed wearing no clothing and no oxygen in place and patient stating she wants to leave.  Patient dropped a cup of water on the floor at her feet and with no non skid socks on.  O2 replaced on patient, linens changed, patient cleaned and new gown placed.  Patient positioned in bed and wound care preformed on left knee as patient had mostly removed prior dressing.  Patient with eyes closed throughout care.  Patient resting in bed.  Patient educated to use call bell for needs.  AM meds will be given when patient is more alert.

## 2021-06-06 NOTE — Progress Notes (Signed)
ID Pt does not respond  She got a dose of 1 mg haldol this morning because she was agitated last night She got a dose of linezolid yesterday round 3.30 pm for  staph infection left knee - until susceptibility was able She was doing fine until niught, when she also got her antidepressants Daughter at bed side and says that ever so often she does this at home- when she is completely unresponsive and then wake up the next day Patient Vitals for the past 24 hrs:  BP Temp Pulse Resp SpO2  06/06/21 1640 103/68 98 F (36.7 C) (!) 103 18 92 %  06/06/21 0802 108/74 97.8 F (36.6 C) 79 16 --  06/06/21 0100 -- 97.7 F (36.5 C) -- 18 --  06/06/21 0036 108/70 -- (!) 102 -- 94 %  06/05/21 1955 115/67 98.5 F (36.9 C) 89 (!) 21 94 %  06/05/21 1953 -- -- -- -- 94 %    Pt lying with eyes closed Opens eyes on persistently calling her name Some dystonic movements of the mouth She opened her mouth Chest b/l air entry Hs irregular  Labs CBC Latest Ref Rng & Units 06/06/2021 06/05/2021 06/04/2021  WBC 4.0 - 10.5 K/uL 15.9(H) 22.8(H) 26.9(H)  Hemoglobin 12.0 - 15.0 g/dL 7.7(L) 8.5(L) 9.8(L)  Hematocrit 36.0 - 46.0 % 25.9(L) 27.7(L) 32.0(L)  Platelets 150 - 400 K/uL 254 351 467(H)    CMP Latest Ref Rng & Units 06/06/2021 06/05/2021 06/04/2021  Glucose 70 - 99 mg/dL 117(H) 139(H) 172(H)  BUN 8 - 23 mg/dL 32(H) 37(H) 42(H)  Creatinine 0.44 - 1.00 mg/dL 1.27(H) 1.23(H) 1.39(H)  Sodium 135 - 145 mmol/L 137 136 140  Potassium 3.5 - 5.1 mmol/L 3.7 3.9 4.3  Chloride 98 - 111 mmol/L 94(L) 93(L) 90(L)  CO2 22 - 32 mmol/L 37(H) 36(H) 37(H)  Calcium 8.9 - 10.3 mg/dL 8.1(L) 8.4(L) 9.1  Total Protein 6.5 - 8.1 g/dL - - -  Total Bilirubin 0.3 - 1.2 mg/dL - - -  Alkaline Phos 38 - 126 U/L - - -  AST 15 - 41 U/L - - -  ALT 0 - 44 U/L - - -     Micro 9/30 Wound culture from left Knee MSSA BC- 05/28/21 acinetobacter 9/30 BC NG   Impression/recommendation Encephalopathy/hypodelirium- recurrent due to her not  wearing BIPAP, Co2 narcosis She was on presentation obtunded and then improved with Bipap, had another episode when she was not using BIPAP. This is her 3rd time Mild co2 retention Watch closely for serotonin syndrome  with SSRI, haldol - linezolid has been discontinued after just one dose  Falls on presentation  Acinetobacter bacteremia- day 9 of antibiotic- DC tomorrow  MSSA infection of the left knee wound- received 1 dose of linezolid yesterday- not needed any more as it is not MRSA after completion of unasyn can start cefadroxil PO for 5-7 days  Afib on diltiazem and bisoprolol   OSA/COPD/obesity hypoventilation syndrome Co2 narcosis as she refuses to wear BIPAP/CPAP regularly  Discussed the management with daughter and hospitalist

## 2021-06-06 NOTE — Progress Notes (Signed)
Patient alert with increased confusion throughout the night, continued to pull off telemetry, removed bipap, continuously pulled off oxygen, yelling out, MD notified, new orders in place, currently resting in bed

## 2021-06-07 DIAGNOSIS — L089 Local infection of the skin and subcutaneous tissue, unspecified: Secondary | ICD-10-CM | POA: Diagnosis not present

## 2021-06-07 DIAGNOSIS — T148XXA Other injury of unspecified body region, initial encounter: Secondary | ICD-10-CM | POA: Diagnosis not present

## 2021-06-07 DIAGNOSIS — G9340 Encephalopathy, unspecified: Secondary | ICD-10-CM | POA: Diagnosis not present

## 2021-06-07 DIAGNOSIS — R7881 Bacteremia: Secondary | ICD-10-CM | POA: Diagnosis not present

## 2021-06-07 DIAGNOSIS — R0689 Other abnormalities of breathing: Secondary | ICD-10-CM | POA: Diagnosis not present

## 2021-06-07 LAB — BASIC METABOLIC PANEL
Anion gap: 9 (ref 5–15)
BUN: 28 mg/dL — ABNORMAL HIGH (ref 8–23)
CO2: 35 mmol/L — ABNORMAL HIGH (ref 22–32)
Calcium: 8.4 mg/dL — ABNORMAL LOW (ref 8.9–10.3)
Chloride: 97 mmol/L — ABNORMAL LOW (ref 98–111)
Creatinine, Ser: 1.13 mg/dL — ABNORMAL HIGH (ref 0.44–1.00)
GFR, Estimated: 53 mL/min — ABNORMAL LOW (ref >60)
Glucose, Bld: 86 mg/dL (ref 70–99)
Potassium: 3.9 mmol/L (ref 3.5–5.1)
Sodium: 141 mmol/L (ref 135–145)

## 2021-06-07 LAB — CULTURE, BLOOD (ROUTINE X 2)
Culture: NO GROWTH
Culture: NO GROWTH
Special Requests: ADEQUATE
Special Requests: ADEQUATE

## 2021-06-07 LAB — CBC
HCT: 29.1 % — ABNORMAL LOW (ref 36.0–46.0)
Hemoglobin: 8.4 g/dL — ABNORMAL LOW (ref 12.0–15.0)
MCH: 26.6 pg (ref 26.0–34.0)
MCHC: 28.9 g/dL — ABNORMAL LOW (ref 30.0–36.0)
MCV: 92.1 fL (ref 80.0–100.0)
Platelets: 276 10*3/uL (ref 150–400)
RBC: 3.16 MIL/uL — ABNORMAL LOW (ref 3.87–5.11)
RDW: 18.2 % — ABNORMAL HIGH (ref 11.5–15.5)
WBC: 18.4 10*3/uL — ABNORMAL HIGH (ref 4.0–10.5)
nRBC: 0 % (ref 0.0–0.2)

## 2021-06-07 LAB — MAGNESIUM: Magnesium: 2.4 mg/dL (ref 1.7–2.4)

## 2021-06-07 MED ORDER — ACETAMINOPHEN 325 MG PO TABS: 650.0000 mg | ORAL_TABLET | Freq: Four times a day (QID) | ORAL | Status: DC | PRN

## 2021-06-07 MED ORDER — FOLIC ACID 1 MG PO TABS
1.0000 mg | ORAL_TABLET | Freq: Every day | ORAL | Status: DC
  Administered 2021-06-08: 1 mg via ORAL
  Filled 2021-06-07: qty 1

## 2021-06-07 MED ORDER — TRAMADOL HCL 50 MG PO TABS
50.0000 mg | ORAL_TABLET | Freq: Two times a day (BID) | ORAL | Status: DC | PRN
Start: 2021-06-07 — End: 2021-06-08
  Administered 2021-06-08: 10:00:00 50 mg via ORAL
  Filled 2021-06-07: qty 1

## 2021-06-07 MED ORDER — FOLIC ACID 5 MG/ML IJ SOLN
1.0000 mg | Freq: Once | INTRAMUSCULAR | Status: AC
  Administered 2021-06-07: 18:00:00 1 mg via INTRAVENOUS
  Filled 2021-06-07: qty 0.2

## 2021-06-07 MED ORDER — CEFADROXIL 500 MG PO CAPS
1000.0000 mg | ORAL_CAPSULE | Freq: Two times a day (BID) | ORAL | Status: DC
  Administered 2021-06-08: 1000 mg via ORAL
  Filled 2021-06-07 (×3): qty 2

## 2021-06-07 MED ORDER — CYANOCOBALAMIN 1000 MCG/ML IJ SOLN
1000.0000 ug | Freq: Once | INTRAMUSCULAR | Status: AC
  Administered 2021-06-07: 18:00:00 1000 ug via SUBCUTANEOUS
  Filled 2021-06-07: qty 1

## 2021-06-07 NOTE — Progress Notes (Addendum)
Paula Torres  GYI:[id number] DOB: [birth year] DOA: 05/28/2021 PCP: Rusty Aus, MD    Brief Narrative:  [phone] who reportedly lives independently at home with a history of diastolic CHF, HLD, COPD on 3 L chronic oxygen support, GERD, depression, PAF on chronic Eliquis, pulmonary hypertension, OSA noncompliant with BiPAP, chronic iron deficiency anemia, and CKD stage IIIa who presented to the ED 05/28/2021 with progressive leg pain bleeding and oozing after a mechanical fall 3 days prior. Evaluation in the ED revealed findings of severe sepsis with left lower extremity wound infection/cellulitis.  Consultants:  Infectious disease  Code Status: FULL CODE  Antimicrobials:  Unasyn 9/25 > 10/5  DVT prophylaxis: SCDs  Subjective: Opens eyes and turns to examiner.  Will not answer questions.  Is somnolent.  Does not appear to be in extremis.  Has been refusing to wear BiPAP, refusing to wear oxygen, refusing to wear telemetry leads, and often times refusing all care by the nursing staff.  Assessment & Plan:  Severe sepsis POA - left lower extremity MSSA cellulitis/wound infection Leukocytosis, tachypnea, lactic acidosis with source of infection at time of presentation -no evidence of DVT on left lower extremity venous duplex -local wound care to continue - to complete 10 days of Unasyn empiric therapy after which we will change to cefadroxil for 5-7 additional days as per ID suggestion  Secondary Acinetobacter bacteremia 05/28/2021 Antibiotic regimen as discussed above -follow-up blood cultures 9/30 no growth x2  Acute on chronic hypoxic hypercapnic respiratory failure -acute advanced COPD exacerbation -uncontrolled OSA due to noncompliance/refusal Refuses BiPAP and often even refuses oxygen therapy -no bronchospasm today therefore will stop steroids -continue inhaled medical therapies  Hypercapnic encephalopathy Latest ABG 10/4 notes elevated PCO2 but an entirely normal pH suggesting  this is her baseline at approximately 65  Encephalopathy/lethargy Likely a component of hypercapnia and possibly transient hypoxia but I am also concerned about the possibility of medication induced sedation -avoid all sedating medications especially in setting of a noncompliant OSA patient  B12 deficiency Modest -supplement with subcu X38 today  Folic acid deficiency Modest -first dose IV then daily oral replacement  Hyponatremia Felt to be related to low-grade dehydration -resolved  Paroxysmal atrial fibrillation with acute RVR Acute RVR developed 9/28 -no doubt linked to her unmanaged OSA -continue beta-blocker and CCB -Eliquis on hold due to wound bleeding -heart rate well controlled at this time  Chronic diastolic congestive heart failure TTE 2021 noted EF 60-65% -compensated at present  AKI on CKD Stage IIIa Baseline creatinine ~1.1 -creatinine essentially at her baseline presently  Pulmonary hypertension Continue treprostinil  Depression Continue Lexapro  Hyperlipidemia Continue Lipitor  Chronic iron deficiency anemia with acute blood loss anemia Was bleeding from leg wound -required 1 unit PRBC 9/27 -Eliquis on hold  Morbid obesity - Body mass index is 45.01 kg/m.    Family Communication:  Status is: Inpatient  Remains inpatient appropriate because:Inpatient level of care appropriate due to severity of illness  Dispo: The patient is from: Home              Anticipated d/c is to:  Unclear              Patient currently is not medically stable to d/c.   Difficult to place patient Yes  Objective: Blood pressure (!) 151/89, pulse (!) 108, temperature 97.7 F (36.5 C), temperature source Oral, resp. rate 20, height _0  (1.651 m), weight 122.7 kg, SpO2 97 %.  Intake/Output Summary (Last 24 hours)  at 06/07/2021 1242 Last data filed at 06/07/2021 0730 Gross per 24 hour  Intake --  Output 900 ml  Net -900 ml   Filed Weights   06/04/21 0500 06/05/21 0500  06/07/21 1100  Weight: 130.8 kg (!) 137 kg 122.7 kg    Examination: General: No acute respiratory distress -lethargic Lungs: Clear to auscultation bilaterally without wheezes or crackles Cardiovascular: Regular rate and rhythm without murmur gallop or rub normal S1 and S2 Abdomen: Nontender, nondistended, soft, bowel sounds positive, no rebound, no ascites, no appreciable mass Extremities: No significant cyanosis, clubbing, or edema bilateral lower extremities  CBC: Recent Labs  Lab 06/05/21 0422 06/06/21 0635 06/07/21 0508  WBC 22.8* 15.9* 18.4*  HGB 8.5* 7.7* 8.4*  HCT 27.7* 25.9* 29.1*  MCV 88.8 89.3 92.1  PLT 351 254 235   Basic Metabolic Panel: Recent Labs  Lab 06/05/21 0422 06/06/21 0635 06/07/21 0508  NA 136 137 141  K 3.9 3.7 3.9  CL 93* 94* 97*  CO2 36* 37* 35*  GLUCOSE 139* 117* 86  BUN 37* 32* 28*  CREATININE 1.23* 1.27* 1.13*  CALCIUM 8.4* 8.1* 8.4*  MG 2.2 2.2 2.4   GFR: Estimated Creatinine Clearance: 61.8 mL/min (A) (by C-G formula based on SCr of 1.13 mg/dL (H)).   HbA1C: Hgb A1c MFr Bld  Date/Time Value Ref Range Status  01/19/2020 08:37 AM 5.7 (H) 4.8 - 5.6 % Final    Comment:    (NOTE)         Prediabetes: 5.7 - 6.4         Diabetes: >6.4         Glycemic control for adults with diabetes: <7.0   11/09/2019 09:14 AM 6.0 (H) 4.8 - 5.6 % Final    Comment:    (NOTE) Pre diabetes:          5.7%-6.4% Diabetes:              >6.4% Glycemic control for   <7.0% adults with diabetes     Recent Results (from the past 240 hour(s))  Resp Panel by RT-PCR (Flu A&B, Covid) Nasopharyngeal Swab     Status: None   Collection Time: 05/28/21 12:46 PM   Specimen: Nasopharyngeal Swab; Nasopharyngeal(NP) swabs in vial transport medium  Result Value Ref Range Status   SARS Coronavirus 2 by RT PCR NEGATIVE NEGATIVE Final    Comment: (NOTE) SARS-CoV-2 target nucleic acids are NOT DETECTED.  The SARS-CoV-2 RNA is generally detectable in upper  respiratory specimens during the acute phase of infection. The lowest concentration of SARS-CoV-2 viral copies this assay can detect is 138 copies/mL. A negative result does not preclude SARS-Cov-2 infection and should not be used as the sole basis for treatment or other patient management decisions. A negative result may occur with  improper specimen collection/handling, submission of specimen other than nasopharyngeal swab, presence of viral mutation(s) within the areas targeted by this assay, and inadequate number of viral copies(<138 copies/mL). A negative result must be combined with clinical observations, patient history, and epidemiological information. The expected result is Negative.  Fact Sheet for Patients:  EntrepreneurPulse.com.au  Fact Sheet for Healthcare Providers:  IncredibleEmployment.be  This test is no t yet approved or cleared by the [place] FDA and  has been authorized for detection and/or diagnosis of SARS-CoV-2 by FDA under an Emergency Use Authorization (EUA). This EUA will remain  in effect (meaning this test can be used) for the duration of the COVID-19 declaration under Section 564(b)(1)  of the Act, 21 U.S.C.section 360bbb-3(b)(1), unless the authorization is terminated  or revoked sooner.       Influenza A by PCR NEGATIVE NEGATIVE Final   Influenza B by PCR NEGATIVE NEGATIVE Final    Comment: (NOTE) The Xpert Xpress SARS-CoV-2/FLU/RSV plus assay is intended as an aid in the diagnosis of influenza from Nasopharyngeal swab specimens and should not be used as a sole basis for treatment. Nasal washings and aspirates are unacceptable for Xpert Xpress SARS-CoV-2/FLU/RSV testing.  Fact Sheet for Patients: EntrepreneurPulse.com.au  Fact Sheet for Healthcare Providers: IncredibleEmployment.be  This test is not yet approved or cleared by the [place] FDA and has been  authorized for detection and/or diagnosis of SARS-CoV-2 by FDA under an Emergency Use Authorization (EUA). This EUA will remain in effect (meaning this test can be used) for the duration of the COVID-19 declaration under Section 564(b)(1) of the Act, 21 U.S.C. section 360bbb-3(b)(1), unless the authorization is terminated or revoked.  Performed at [facility], [address] [place], [place] [address]   CULTURE, BLOOD (ROUTINE X 2) w Reflex to ID Panel     Status: None   Collection Time: 06/02/21 12:59 AM   Specimen: BLOOD  Result Value Ref Range Status   Specimen Description BLOOD RIGHT HAND  Final   Special Requests   Final    BOTTLES DRAWN AEROBIC AND ANAEROBIC Blood Culture adequate volume   Culture   Final    NO GROWTH 5 DAYS Performed at [facility], [address] [place], [place] [address]    Report Status 06/07/2021 FINAL  Final  CULTURE, BLOOD (ROUTINE X 2) w Reflex to ID Panel     Status: None   Collection Time: 06/02/21  1:10 AM   Specimen: BLOOD  Result Value Ref Range Status   Specimen Description BLOOD RIGHT ASSIST CONTROL  Final   Special Requests   Final    BOTTLES DRAWN AEROBIC AND ANAEROBIC Blood Culture adequate volume   Culture   Final    NO GROWTH 5 DAYS Performed at [facility], [address], [place], [place] [address]    Report Status 06/07/2021 FINAL  Final  Aerobic Culture w Gram Stain (superficial specimen)     Status: None   Collection Time: 06/02/21  1:27 PM   Specimen: KNEE; Wound  Result Value Ref Range Status   Specimen Description   Final    KNEE Performed at [facility], [address], [place], [place] [address]    Special Requests   Final    NONE Performed at [facility], [address] [place], [place] [address]    Gram Stain   Final    NO SQUAMOUS EPITHELIAL CELLS PRESENT FEW WBC PRESENT, PREDOMINANTLY MONONUCLEAR RARE GRAM POSITIVE COCCI Performed at [place], [place] [address] [place], [place] [address]    Culture RARE STAPHYLOCOCCUS AUREUS  Final   Report Status 06/06/2021 FINAL  Final   Organism ID, Bacteria STAPHYLOCOCCUS AUREUS  Final      Susceptibility   Staphylococcus aureus - MIC*    CIPROFLOXACIN >=8 RESISTANT Resistant     ERYTHROMYCIN >=8 RESISTANT Resistant     GENTAMICIN <=0.5 SENSITIVE Sensitive     OXACILLIN 0.5 SENSITIVE Sensitive     TETRACYCLINE <=1 SENSITIVE Sensitive     VANCOMYCIN <=0.5 SENSITIVE Sensitive     TRIMETH/SULFA <=10 SENSITIVE Sensitive     CLINDAMYCIN <=0.25 SENSITIVE Sensitive     RIFAMPIN <=0.5  SENSITIVE Sensitive     Inducible Clindamycin NEGATIVE Sensitive     * RARE STAPHYLOCOCCUS AUREUS     Scheduled Meds:  atorvastatin  20 mg Oral QHS   bisoprolol  10 mg Oral BID   collagenase   Topical Daily   diltiazem  60 mg Oral Q8H   escitalopram  20 mg Oral Daily   famotidine  20 mg Oral QHS   ferrous gluconate  324 mg Oral Weekly   levalbuterol  1.25 mg Nebulization TID   loratadine  10 mg Oral QHS   mometasone-formoterol  2 puff Inhalation BID   And   umeclidinium bromide  1 puff Inhalation Daily   montelukast  10 mg Oral QHS   predniSONE  20 mg Oral Q breakfast   Continuous Infusions:  sodium chloride Stopped (05/28/21 1600)   ampicillin-sulbactam (UNASYN) IV 3 g (06/07/21 0416)     LOS: 10 days   Cherene Altes, MD [facility] Office  [phone] Pager - Text Page per Shea Evans  If 7PM-7AM, please contact night-coverage per Amion 06/07/2021, 12:42 PM

## 2021-06-07 NOTE — Progress Notes (Signed)
Patient asked for pain medication. The only options available on her MAR were Tylenol and Ultram. When told those options, patient did not responded. I again told her the options available, patient again would not respond. For the 3rd time I gave patient the options to chose and again she did not respond. Made sure call bell was within reach. I told patient if she needed anything to please call out to nurses station and let me know.

## 2021-06-07 NOTE — Progress Notes (Signed)
Patient refused all morning medications, encouraged taking, offered different ways to take, with applesauce, ice cream, crushed, one at a time. Patient still refused. Patient educated on medications. MAR documented patient refused.

## 2021-06-07 NOTE — Progress Notes (Signed)
Date of Admission:  05/28/2021     ID: Paula Torres is a 70 y.o. female  Principal Problem:   Wound infection_left lower leg Active Problems:   Hyperlipidemia   COPD exacerbation (HCC)   Pulmonary hypertension ([place])   Depression   Acute on chronic respiratory failure with hypoxia (HCC)   AF (paroxysmal atrial fibrillation) (HCC)   Severe sepsis (HCC)   Chronic diastolic CHF (congestive heart failure) (HCC)   Acute renal failure superimposed on stage 3a chronic kidney disease (HCC)   Hyponatremia   Hypokalemia   Iron deficiency anemia   Cellulitis of left lower leg    Subjective: More awake today. Sitting in chair.    Medications:   atorvastatin  20 mg Oral QHS   bisoprolol  10 mg Oral BID   [START ON 06/08/2021] cefadroxil  1,000 mg Oral BID   collagenase   Topical Daily   diltiazem  60 mg Oral Q8H   famotidine  20 mg Oral QHS   ferrous gluconate  324 mg Oral Weekly   [START ON 18/10/8831] folic acid  1 mg Oral Daily   levalbuterol  1.25 mg Nebulization TID   mometasone-formoterol  2 puff Inhalation BID   And   umeclidinium bromide  1 puff Inhalation Daily   montelukast  10 mg Oral QHS    Objective: Vital signs in last 24 hours: Temp:  [97.5 F (36.4 C)-98.3 F (36.8 C)] 97.5 F (36.4 C) (10/05 1527) Pulse Rate:  [45-108] 75 (10/05 1527) Resp:  [18-20] 20 (10/05 1527) BP: (99-151)/(62-113) 120/73 (10/05 1527) SpO2:  [71 %-100 %] 83 % (10/05 1527) Weight:  [122.7 kg] 122.7 kg (10/05 1100)  PHYSICAL EXAM:  General: Awake, more alert, obese Chest bilateral air entry Heart: Irregular.  Rate controlled.. Abdomen: Soft, non-tender,not distended. Bowel sounds normal. No masses Extremities: Left knee wound covered with eschar Left leg swelling improved Bruising/ecchymosis on the left leg is also improving.   Skin: No rashes or lesions. Or bruising Lymph: Cervical, supraclavicular normal. Neurologic: Involuntary jerking movement of the left hand  Lab  Results Recent Labs    06/06/21 0635 06/07/21 0508  WBC 15.9* 18.4*  HGB 7.7* 8.4*  HCT 25.9* 29.1*  NA 137 141  K 3.7 3.9  CL 94* 97*  CO2 37* 35*  BUN 32* 28*  CREATININE 1.27* 1.13*    Microbiology: Micro 9/30 Wound culture from left Knee MSSA BC- 05/28/21 acinetobacter 9/30 BC NG    Assessment/Plan: Encephalopathy/hyper delirium secondary to CO2 narcosis.  She had not been wearing CPAP.  This is recurrent since her admission.  She is also on multiple antidepressants.  Hospitalist concerned that this is contributing to her encephalopathy.  Patient presented with falls. Wound left knee at the place of a previous hematoma.  Eschar present.  Cultures revealed MSSA. She got 1 dose of linezolid.  This has been changed now to cefadroxil for 7 days. Follow WBC.  Acinetobacter bacteremia on presentation secondary to the wound..  Repeat blood culture negative.  Completed 10 days of IV Unasyn. A. fib on diltiazem and bisoprolol  OSA/COPD/obesity hypoventilation syndrome Leading to CO2 narcosis causing encephalopathy because of refusal to wear CPAP regularly.  B12 deficiency getting supplement  ID will sign off.  Call if needed.

## 2021-06-07 NOTE — Progress Notes (Signed)
Patient removed bipap, encouraged use, refuses to put bipap back on, agreed to O2 via [place]

## 2021-06-07 NOTE — Progress Notes (Signed)
NT called RN to room, stating patient was complaining of SOB. On assessment patient was SOB, breathing through mouth. Patient encouraged and educated to breath through nose where oxygen was located. Patient did not. Oxygen placed by mouth to ensure oxygen reaching the patient, patient pushed it out of the way. O2 saturations reading on finger probe was 90% on 4Lpm. Patietnshook probe off finger. I  Increased oxygen to 5Lpm, encouraged patient to continue to breath through nose. Will continue to monitor patient.

## 2021-06-07 NOTE — Progress Notes (Signed)
PT Cancellation Note  Patient Details Name: Paula Torres MRN: [id number] DOB: Jan 07, 1951   Cancelled Treatment:     PT attempt. Pt refused requesting [facility] Curia return next date. Pt is slightly anxious and unwilling to get OOB. We will continue to follow and progress pt per current POC.    Willette Pa 06/07/2021, 3:48 PM

## 2021-06-08 ENCOUNTER — Other Ambulatory Visit: Payer: Self-pay

## 2021-06-08 ENCOUNTER — Emergency Department
Admission: EM | Admit: 2021-06-08 | Discharge: 2021-06-09 | Disposition: A | Discharge status: Skilled Nursing Facility | Payer: Medicare Other | Source: Home / Self Care | Attending: Emergency Medicine | Admitting: Emergency Medicine

## 2021-06-08 ENCOUNTER — Encounter: Payer: Self-pay | Admitting: Emergency Medicine

## 2021-06-08 DIAGNOSIS — X58XXXA Exposure to other specified factors, initial encounter: Secondary | ICD-10-CM | POA: Insufficient documentation

## 2021-06-08 DIAGNOSIS — J449 Chronic obstructive pulmonary disease, unspecified: Secondary | ICD-10-CM | POA: Insufficient documentation

## 2021-06-08 DIAGNOSIS — J9611 Chronic respiratory failure with hypoxia: Secondary | ICD-10-CM | POA: Insufficient documentation

## 2021-06-08 DIAGNOSIS — S81809A Unspecified open wound, unspecified lower leg, initial encounter: Secondary | ICD-10-CM | POA: Insufficient documentation

## 2021-06-08 DIAGNOSIS — R0902 Hypoxemia: Secondary | ICD-10-CM | POA: Diagnosis not present

## 2021-06-08 DIAGNOSIS — I5033 Acute on chronic diastolic (congestive) heart failure: Secondary | ICD-10-CM | POA: Insufficient documentation

## 2021-06-08 DIAGNOSIS — I110 Hypertensive heart disease with heart failure: Secondary | ICD-10-CM | POA: Insufficient documentation

## 2021-06-08 DIAGNOSIS — R600 Localized edema: Secondary | ICD-10-CM | POA: Insufficient documentation

## 2021-06-08 DIAGNOSIS — E669 Obesity, unspecified: Secondary | ICD-10-CM | POA: Insufficient documentation

## 2021-06-08 DIAGNOSIS — I2510 Atherosclerotic heart disease of native coronary artery without angina pectoris: Secondary | ICD-10-CM | POA: Insufficient documentation

## 2021-06-08 DIAGNOSIS — Z87891 Personal history of nicotine dependence: Secondary | ICD-10-CM | POA: Insufficient documentation

## 2021-06-08 DIAGNOSIS — Z9981 Dependence on supplemental oxygen: Secondary | ICD-10-CM | POA: Insufficient documentation

## 2021-06-08 DIAGNOSIS — J441 Chronic obstructive pulmonary disease with (acute) exacerbation: Secondary | ICD-10-CM | POA: Diagnosis not present

## 2021-06-08 LAB — COMPREHENSIVE METABOLIC PANEL
ALT: 15 U/L (ref 0–44)
AST: 10 U/L — ABNORMAL LOW (ref 15–41)
Albumin: 3 g/dL — ABNORMAL LOW (ref 3.5–5.0)
Alkaline Phosphatase: 73 U/L (ref 38–126)
Anion gap: 5 (ref 5–15)
BUN: 26 mg/dL — ABNORMAL HIGH (ref 8–23)
CO2: 38 mmol/L — ABNORMAL HIGH (ref 22–32)
Calcium: 8.3 mg/dL — ABNORMAL LOW (ref 8.9–10.3)
Chloride: 97 mmol/L — ABNORMAL LOW (ref 98–111)
Creatinine, Ser: 1.05 mg/dL — ABNORMAL HIGH (ref 0.44–1.00)
GFR, Estimated: 58 mL/min — ABNORMAL LOW (ref >60)
Glucose, Bld: 95 mg/dL (ref 70–99)
Potassium: 3.7 mmol/L (ref 3.5–5.1)
Sodium: 140 mmol/L (ref 135–145)
Total Bilirubin: 1.1 mg/dL (ref 0.3–1.2)
Total Protein: 5.7 g/dL — ABNORMAL LOW (ref 6.5–8.1)

## 2021-06-08 LAB — RESP PANEL BY RT-PCR (FLU A&B, COVID) ARPGX2
Influenza A by PCR: NEGATIVE
Influenza B by PCR: NEGATIVE
SARS Coronavirus 2 by RT PCR: NEGATIVE

## 2021-06-08 LAB — AMMONIA: Ammonia: <10 umol/L (ref 9–35)

## 2021-06-08 MED ORDER — ACETAMINOPHEN 325 MG PO TABS: 650.0000 mg | ORAL_TABLET | Freq: Four times a day (QID) | ORAL | Status: AC | PRN

## 2021-06-08 MED ORDER — COLLAGENASE 250 UNIT/GM EX OINT: TOPICAL_OINTMENT | Freq: Every day | CUTANEOUS | 0 refills | Status: AC

## 2021-06-08 MED ORDER — DM-GUAIFENESIN ER 30-600 MG PO TB12: 1.0000 | ORAL_TABLET | Freq: Two times a day (BID) | ORAL | Status: AC | PRN

## 2021-06-08 MED ORDER — DILTIAZEM HCL 60 MG PO TABS: 60.0000 mg | ORAL_TABLET | Freq: Three times a day (TID) | ORAL | Status: AC

## 2021-06-08 MED ORDER — FOLIC ACID 1 MG PO TABS: 1.0000 mg | ORAL_TABLET | Freq: Every day | ORAL | Status: AC

## 2021-06-08 MED ORDER — CEFADROXIL 500 MG PO CAPS: 1000.0000 mg | ORAL_CAPSULE | Freq: Two times a day (BID) | ORAL | 0 refills | Status: AC

## 2021-06-08 MED ORDER — DICLOFENAC SODIUM 1 % EX GEL
2.0000 g | Freq: Four times a day (QID) | CUTANEOUS | Status: DC | PRN
  Administered 2021-06-08: 2 g via TOPICAL
  Filled 2021-06-08: qty 100

## 2021-06-08 MED ORDER — LEVALBUTEROL HCL 1.25 MG/0.5ML IN NEBU: 1.2500 mg | INHALATION_SOLUTION | Freq: Three times a day (TID) | RESPIRATORY_TRACT | 12 refills | Status: AC

## 2021-06-08 MED ORDER — LEVALBUTEROL HCL 0.63 MG/3ML IN NEBU
0.6300 mg | INHALATION_SOLUTION | RESPIRATORY_TRACT | Status: DC | PRN
  Filled 2021-06-08: qty 3

## 2021-06-08 MED ORDER — LEVALBUTEROL HCL 0.63 MG/3ML IN NEBU: 0.6300 mg | INHALATION_SOLUTION | RESPIRATORY_TRACT | 12 refills | Status: AC | PRN

## 2021-06-08 MED ORDER — BISOPROLOL FUMARATE 10 MG PO TABS: 10.0000 mg | ORAL_TABLET | Freq: Two times a day (BID) | ORAL | Status: AC

## 2021-06-08 NOTE — Progress Notes (Signed)
Called report to [facility]  LPM at [facility] [phone].  Going to Room 609

## 2021-06-08 NOTE — Progress Notes (Signed)
Occupational Therapy Treatment Patient Details Name: Paula Torres MRN: [id number] DOB: 1951/08/09 Today's Date: 06/08/2021   History of present illness 70 y.o. female with medical history significant of dCHF, HLD, COPD on 3 L oxygen, GERD, depression, PAF on Eliquis, pulmonary HTN, OSA on BiPAP, CAD, iron deficiency anemia, CKD-3A, who presents with left leg wound with bleeding and pain.   OT comments  Upon entering the room, pt supine in bed with no c/o pain. She is very anxious about mobility this session but very motivated to get into recliner chair. Pt needing increased cuing for pursed lip breathing. Pt stands with min of 2 and performs stand pivot transfer with use of RW to recliner chair. Pt's O2 decreased to 67% and pt's O2 increased to 6L in order for pt to recover to 88%. RN notified. Pt requesting a rescue inhaler from RN. Pt remained seated in recliner chair to eat lunch. All needs within reach and pt much calmer as therapist exits the room.    Recommendations for follow up therapy are one component of a multi-disciplinary discharge planning process, led by the attending physician.  Recommendations may be updated based on patient status, additional functional criteria and insurance authorization.    Follow Up Recommendations  SNF    Equipment Recommendations  3 in 1 bedside commode       Precautions / Restrictions Precautions Precautions: Fall Precaution Comments: monitor O2 sats, HR Restrictions Other Position/Activity Restrictions: BLE L>R wounds       Mobility Bed Mobility Overal bed mobility: Needs Assistance Bed Mobility: Supine to Sit     Supine to sit: Min assist     General bed mobility comments: min A to move LEs over EOB.    Transfers Overall transfer level: Needs assistance Equipment used: Rolling walker (2 wheeled) Transfers: Sit to/from [facility] Sit to Stand: Min assist;+2 physical assistance Stand pivot transfers: Min  assist;+2 physical assistance       General transfer comment: min A of 2 secondary to pt being very anxious for transfer this session    Balance Overall balance assessment: Needs assistance Sitting-balance support: Feet supported;No upper extremity supported Sitting balance-Leahy Scale: Good     Standing balance support: Bilateral upper extremity supported Standing balance-Leahy Scale: Poor Standing balance comment: B UE support required                           ADL either performed or assessed with clinical judgement     Vision Patient Visual Report: No change from baseline            Cognition Arousal/Alertness: Awake/alert Behavior During Therapy: WFL for tasks assessed/performed Overall Cognitive Status: Within Functional Limits for tasks assessed                                 General Comments: A & O x4                   Pertinent Vitals/ Pain       Pain Assessment: No/denies pain         Frequency  Min 2X/week        Progress Toward Goals  OT Goals(current goals can now be found in the care plan section)  Progress towards OT goals: Progressing toward goals  Acute Rehab OT Goals Patient Stated Goal: to go home OT Goal Formulation: With patient Time  For Goal Achievement: 06/13/2021 Potential to Achieve Goals: Good  Plan Discharge plan remains appropriate       AM-PAC OT "6 Clicks" Daily Activity     Outcome Measure   Help from another person eating meals?: None Help from another person taking care of personal grooming?: None Help from another person toileting, which includes using toliet, bedpan, or urinal?: A Lot Help from another person bathing (including washing, rinsing, drying)?: A Lot Help from another person to put on and taking off regular upper body clothing?: A Little Help from another person to put on and taking off regular lower body clothing?: A Lot 6 Click Score: 17    End of Session Equipment  Utilized During Treatment: Rolling walker;Oxygen  OT Visit Diagnosis: Other abnormalities of gait and mobility (R26.89);Repeated falls (R29.6);Muscle weakness (generalized) (M62.81)   Activity Tolerance Patient tolerated treatment well   Patient Left in bed;with call bell/phone within reach   Nurse Communication Other (comment) (O2 increased to 6Ls)        Time: 6701-1003 OT Time Calculation (min): 25 min  Charges: OT General Charges $OT Visit: 1 Visit OT Treatments $Therapeutic Activity: 23-37 mins  Darleen Crocker, MS, OTR/L , CBIS ascom [phone]  06/08/21, 1:07 PM

## 2021-06-08 NOTE — TOC Transition Note (Signed)
Transition of Care [facility]) - CM/SW Discharge Note   Patient Details  Name: Paula Torres MRN: [id number] Date of Birth: 02/26/1951  [facility] CM/SW Contact:  Shelbie Hutching, RN Phone Number: 06/08/2021, 4:06 PM   Clinical Narrative:    Patient is medically cleared for discharge to [facility] today.  Insurance authorization has been approved and CoVID test is negative.  Patient will be going to room 609, bedside RN will call report.  RNCM has arranged EMS and patient is 4th on the list for pick up.  Patient's daughter, [name] updated on discharge.  [name] agreed to get patient's Trilogy machine from home and take it to the facility.     Final next level of care: Skilled Nursing Facility Barriers to Discharge: Barriers Resolved   Patient Goals and CMS Choice Patient states their goals for this hospitalization and ongoing recovery are:: to go home CMS Medicare.gov Compare Post Acute Care list provided to:: Patient Choice offered to / list presented to : Patient  Discharge Placement              Patient chooses bed at: [facility] [place] Patient to be transferred to facility by: [facility] Name of family member notified: [name] Patient and family notified of of transfer: 06/08/21  Discharge Plan and Services   Discharge Planning Services: CM Consult Post Acute Care Choice: [facility]          DME Arranged: N/A DME Agency: NA       HH Arranged: NA          Social Determinants of Health (SDOH) Interventions     Readmission Risk Interventions No flowsheet data found.

## 2021-06-08 NOTE — Progress Notes (Signed)
Physical Therapy Treatment Patient Details Name: Paula Torres MRN: [id number] DOB: [birth year] Today's Date: 06/08/2021   History of Present Illness 70 y.o. female with medical history significant of dCHF, HLD, COPD on 3 L oxygen, GERD, depression, PAF on Eliquis, pulmonary HTN, OSA on BiPAP, CAD, iron deficiency anemia, CKD-3A, who presents with left leg wound with bleeding and pain.    PT Comments    Pt on 6L O2 via [place] upon arrival after finishing with OT.  96% O2 sats at rest, 88% upon exertion with good recovery within 1 minute rest break. Pt completed B LE seated AROM to facilitate strength and circulation. Pt educated on importance of increased mobility and benefits of participating in PT. Pt initially requested assist of 2 to stand (?fear of falling)  Pt able to stand several times from recliner with MinA x 1. Static standing at RW for up to 20sec intervals.  Pt declined hygiene for bowel incontinence and instead requested to eat lunch. Nursing aware.  Continue PT services at SNF in order to regain functional independence and return home.     Recommendations for follow up therapy are one component of a multi-disciplinary discharge planning process, led by the attending physician.  Recommendations may be updated based on patient status, additional functional criteria and insurance authorization.  Follow Up Recommendations  SNF;Supervision for mobility/OOB     Equipment Recommendations       Recommendations for Other Services       Precautions / Restrictions Precautions Precautions: Fall Precaution Comments: monitor O2 sats, HR Restrictions Other Position/Activity Restrictions: BLE L>R wounds     Mobility  Bed Mobility Overal bed mobility: Needs Assistance Bed Mobility: Supine to Sit     Supine to sit: Min assist     General bed mobility comments: min A to move LEs over EOB.    Transfers Overall transfer level: Needs assistance Equipment used: Rolling walker  (2 wheeled) Transfers: Sit to/from [facility] Sit to Stand: Min assist Stand pivot transfers: Min assist;+2 physical assistance       General transfer comment:  (Pt stated she needed 2 people to stand from recliner. Pt however only requires MinA to transfer to standing. Somewhat self limiting. Completed standing at RW 3 times for up to 20 second intervals.)  Ambulation/Gait             General Gait Details:  (Declined)   Stairs             Wheelchair Mobility    Modified Rankin (Stroke Patients Only)       Balance Overall balance assessment: Needs assistance Sitting-balance support: Feet supported;No upper extremity supported Sitting balance-Leahy Scale: Good     Standing balance support: Bilateral upper extremity supported Standing balance-Leahy Scale: Poor Standing balance comment: B UE support required                            Cognition Arousal/Alertness: Awake/alert Behavior During Therapy: Anxious Overall Cognitive Status: Within Functional Limits for tasks assessed                                 General Comments:  (Cues to encourage completion of task)      Exercises General Exercises - Lower Extremity Ankle Circles/Pumps: AROM;Both;20 reps Long Arc Quad: AROM;Both;10 reps Hip ABduction/ADduction: AROM;Both;10 reps Hip Flexion/Marching: AROM;Both;10 reps Other Exercises Other Exercises:  (Good tolerance  for following visual and verbal cues to complete LE exercises)    General Comments General comments (skin integrity, edema, etc.):  (Pt assisted with hygiene when noted to be incontinent of stool. Pt however would not allow this therapist to complete task, instead wanted to eat lunch first)      Pertinent Vitals/Pain Pain Assessment: No/denies pain    Home Living                      Prior Function            PT Goals (current goals can now be found in the care plan section) Acute  Rehab PT Goals Patient Stated Goal: to go home    Frequency    Min 2X/week      PT Plan Current plan remains appropriate    Co-evaluation              AM-PAC PT "6 Clicks" Mobility   Outcome Measure  Help needed turning from your back to your side while in a flat bed without using bedrails?: A Little Help needed moving from lying on your back to sitting on the side of a flat bed without using bedrails?: A Little Help needed moving to and from a bed to a chair (including a wheelchair)?: A Little Help needed standing up from a chair using your arms (e.g., wheelchair or bedside chair)?: A Little Help needed to walk in hospital room?: A Little Help needed climbing 3-5 steps with a railing? : Total 6 Click Score: 16    End of Session Equipment Utilized During Treatment: Gait belt;Oxygen Activity Tolerance: Patient tolerated treatment well Patient left: in chair;with call bell/phone within reach;with chair alarm set;with nursing/sitter in room Nurse Communication: Mobility status;Precautions PT Visit Diagnosis: Unsteadiness on feet (R26.81);Other abnormalities of gait and mobility (R26.89);Repeated falls (R29.6);Muscle weakness (generalized) (M62.81);Difficulty in walking, not elsewhere classified (R26.2);Pain     Time: 1250-1330 PT Time Calculation (min) (ACUTE ONLY): 40 min  Charges:  $Therapeutic Exercise: 8-22 mins $Therapeutic Activity: 23-37 mins                    Mikel Cella, PTA   Paula Torres 06/08/2021, 1:36 PM

## 2021-06-08 NOTE — ED Notes (Signed)
Pt remains 94% on 6L via [place], pt tolerating well. Pt sitting in recliner speaking to daughter without difficulty and eating ice at this time.

## 2021-06-08 NOTE — ED Triage Notes (Signed)
Pt to ED via [facility], pt states was discharged to [facility] today, pt states [facility] does not have a concentrator that is able to accomodate patient's O2 needs. Pt's daughter states at home baseline O2 3.5-4L via [place], states was discharged on 5L via [place] and her O2 sat would not go higher than low to mid 80's, pt currently on 8L via [place]. Pt's daughter reports normal O2 level 88-92%.

## 2021-06-08 NOTE — Discharge Summary (Addendum)
[place]  MR#: [id number]  DOB:1950-12-29  Date of Admission: 05/28/2021 Date of Discharge: 06/08/2021  Attending Physician:Anajah Sterbenz Hennie Duos, MD  Patient's VOJ:[id number], Christean Grief, MD  Consults: [facility] Orthopedics  Disposition: D/C to SNF for rehab stay   Follow-up Appts:  Contact information for after-discharge care     Destination     [facility] Preferred SNF .   Service: Skilled Nursing Contact information: [address] [place] [phone]                     Tests Needing Follow-up: -consider resuming Eliquis in 3-5 days if L leg wound stops bleeding/stabilizes  -recheck B12 level in 8-12 weeks  -recheck folic acid level in 6-78 weeks   Wound Care for L Knee wound: Apply Santyl Qday to left knee in a 1/8 inch layer after cleansing. Top with a normal saline moistened gauze dressing, then ABD pads and kerlex.  BiPAP/CPAP: BiPAP - 18 cm H2O inspiratory/ expiratory pressure of 10cm H2O  CPAP (if BiPAP not available) 10cm of water    Discharge Diagnoses: Severe sepsis POA Left lower extremity MSSA cellulitis / wound infection / hematoma Secondary Acinetobacter bacteremia 05/28/2021 Acute on chronic hypoxic hypercapnic respiratory failure Acute exacerbation advanced COPD exacerbation Uncontrolled OSA due to noncompliance Hypercapnic encephalopathy Encephalopathy/lethargy L38 deficiency Folic acid deficiency Hyponatremia Paroxysmal atrial fibrillation with acute RVR Chronic diastolic congestive heart failure AKI on CKD Stage IIIa Pulmonary hypertension Depression Hyperlipidemia Chronic iron deficiency anemia with acute blood loss anemia Morbid obesity - Body mass index is 45.01 kg/m.    Initial presentation: 70yo who lives independently with a history of diastolic CHF, HLD, COPD on 3 L chronic oxygen support, GERD, depression, PAF on chronic Eliquis, pulmonary hypertension,  untreated OSA noncompliant with BiPAP, chronic iron deficiency anemia, and CKD stage IIIa who presented to the ED 05/28/2021 with progressive L leg pain. Bleeding, and oozing after a mechanical fall 3 days prior. Evaluation in the ED revealed findings of severe sepsis with left lower extremity wound infection/cellulitis.  Hospital Course:  Severe sepsis POA - left lower extremity MSSA cellulitis/wound infection/hematoma Leukocytosis, tachypnea, lactic acidosis with source of infection at time of presentation -no evidence of DVT on left lower extremity venous duplex -local wound care to continue - completed 10 days of Unasyn empiric therapy after which changed to cefadroxil for 7 additional days as per ID suggestion - no evidence of persisting infection of wound at time of d/c    Secondary Acinetobacter bacteremia 05/28/2021 Antibiotic regimen as discussed above - follow-up blood cultures 9/30 no growth x2   Acute on chronic hypoxic hypercapnic respiratory failure -acute advanced COPD exacerbation -uncontrolled OSA due to noncompliance/refusal Refuses BiPAP and often even refuses oxygen therapy - continue inhaled medical therapies - counseled patient on absolute need to comply w/ BiPAP each night, and long term health consequences of continuing to refuse    Hypercapnic encephalopathy Latest ABG 10/4 noted elevated PCO2 but an entirely normal pH suggesting this is her baseline at approximately 65 - fully alert and oriented at time of d/c    Encephalopathy/lethargy Likely a component of hypercapnia and possibly transient hypoxia but I am also concerned about the possibility of medication induced sedation - stopped all sedating medications especially in setting of a noncompliant OSA patient - avoid narcotics and benzos entirely    B12 deficiency [place] - supplemented with subcu B12 10/5 - begin oral B12 and recheck B12 level  in 4-16 weeks    Folic acid deficiency [place] -first dose IV then daily  oral replacement - recheck level in 8-12 weeks    Hyponatremia Felt to be related to low-grade dehydration -resolved   Paroxysmal atrial fibrillation with acute RVR Acute RVR developed 9/28 -no doubt linked to her unmanaged OSA -continue beta-blocker and CCB -Eliquis on hold due to wound bleeding -heart rate well controlled - consider resuming Eliquis in 3-5 days if wound remains stable and no further bleeding is noted    Chronic diastolic congestive heart failure TTE 2021 noted EF 60-65% -compensated at time of d/c    AKI on CKD Stage IIIa Baseline creatinine ~1.1 - creatinine essentially at her baseline at time of d/c    Pulmonary hypertension Continue treprostinil   Depression Continue Lexapro   Hyperlipidemia Continue Lipitor   Chronic iron deficiency anemia with acute blood loss anemia Was bleeding from leg wound -required 1 unit PRBC 9/27 -Eliquis on hold -hemoglobin stable   Morbid obesity - Body mass index is 45.01 kg/m.    Allergies as of 06/08/2021       Reactions   Codeine    Back Headache   Sulfa Antibiotics    Reported to pt from mother who is no longer living -  Details unknown        Medication List     STOP taking these medications    albuterol 108 (90 Base) MCG/ACT inhaler Commonly known as: VENTOLIN HFA   azithromycin 250 MG tablet Commonly known as: ZITHROMAX   Eliquis 5 MG Tabs tablet Generic drug: apixaban   escitalopram 20 MG tablet Commonly known as: LEXAPRO   furosemide 40 MG tablet Commonly known as: LASIX   ipratropium-albuterol 0.5-2.5 (3) MG/3ML Soln Commonly known as: DUONEB   metolazone 2.5 MG tablet Commonly known as: ZAROXOLYN   oxyCODONE-acetaminophen 5-325 MG tablet Commonly known as: Percocet   predniSONE 20 MG tablet Commonly known as: DELTASONE   spironolactone 25 MG tablet Commonly known as: ALDACTONE       TAKE these medications    acetaminophen 325 MG tablet Commonly known as: TYLENOL Take 2  tablets (650 mg total) by mouth every 6 (six) hours as needed for mild pain, fever, moderate pain or headache.   atorvastatin 20 MG tablet Commonly known as: LIPITOR Take 1 tablet (20 mg total) by mouth daily at 6 PM. What changed: when to take this   bisoprolol 10 MG tablet Commonly known as: ZEBETA Take 1 tablet (10 mg total) by mouth 2 (two) times daily. What changed:  medication strength how much to take when to take this   cefadroxil 500 MG capsule Commonly known as: DURICEF Take 2 capsules (1,000 mg total) by mouth 2 (two) times daily for 7 days.   collagenase ointment Commonly known as: SANTYL Apply topically daily. Start taking on: June 09, 2021   dextromethorphan-guaiFENesin 30-600 MG 12hr tablet Commonly known as: MUCINEX DM Take 1 tablet by mouth 2 (two) times daily as needed for cough.   diltiazem 60 MG tablet Commonly known as: CARDIZEM Take 1 tablet (60 mg total) by mouth every 8 (eight) hours.   docusate sodium 100 MG capsule Commonly known as: Colace Take 1 tablet once or twice daily as needed for constipation while taking narcotic pain medicine   famotidine 20 MG tablet Commonly known as: PEPCID Take 20 mg by mouth at bedtime.   ferrous gluconate 324 MG tablet Commonly known as: FERGON Take 324 mg by mouth  once a week.   fluticasone 50 MCG/ACT nasal spray Commonly known as: FLONASE Place 2 sprays into both nostrils daily as needed for allergies.   Fluticasone-Umeclidin-Vilant 100-62.5-25 MCG/INH Aepb Inhale 1 puff into the lungs daily.   folic acid 1 MG tablet Commonly known as: FOLVITE Take 1 tablet (1 mg total) by mouth daily. Start taking on: June 09, 2021   levalbuterol 1.25 MG/0.5ML nebulizer solution Commonly known as: XOPENEX Take 1.25 mg by nebulization 3 (three) times daily.   levalbuterol 0.63 MG/3ML nebulizer solution Commonly known as: XOPENEX Take 3 mLs (0.63 mg total) by nebulization every 4 (four) hours as needed for  wheezing or shortness of breath.   loratadine 10 MG tablet Commonly known as: CLARITIN Take 1 tablet (10 mg total) by mouth daily. What changed: when to take this   montelukast 10 MG tablet Commonly known as: SINGULAIR Take 10 mg by mouth at bedtime.   Treprostinil Diolamine ER 0.25 MG Tbcr Take 0.25 mg by mouth in the morning and at bedtime.   Vitamin D (Ergocalciferol) 1.25 MG (50000 UNIT) Caps capsule Commonly known as: DRISDOL Take 50,000 Units by mouth every Saturday.        Day of Discharge BP (!) 140/99 (BP Location: Right Leg)   Pulse 93   Temp 98 F (36.7 C) (Oral)   Resp 20   Ht _0  (1.651 m)   Wt 122.7 kg   SpO2 95%   BMI 45.01 kg/m   Physical Exam: General: No acute respiratory distress Lungs: Clear to auscultation bilaterally without wheezes or crackles Cardiovascular: Regular rate without murmur gallop or rub normal S1 and S2 Abdomen: Nontender, obese, nondistended, soft, bowel sounds positive, no rebound, no ascites, no appreciable mass Extremities: No significant cyanosis, clubbing, or edema bilateral lower extremities - L knee wound dressed and dry   Basic Metabolic Panel: Recent Labs  Lab 06/03/21 0505 06/04/21 4401 06/05/21 0422 06/06/21 0635 06/07/21 0508 06/08/21 0436  NA 140 140 136 137 141 140  K 4.3 4.3 3.9 3.7 3.9 3.7  CL 92* 90* 93* 94* 97* 97*  CO2 39* 37* 36* 37* 35* 38*  GLUCOSE 143* 172* 139* 117* 86 95  BUN 40* 42* 37* 32* 28* 26*  CREATININE 1.14* 1.39* 1.23* 1.27* 1.13* 1.05*  CALCIUM 9.0 9.1 8.4* 8.1* 8.4* 8.3*  MG 2.4 2.3 2.2 2.2 2.4  --     Liver Function Tests: Recent Labs  Lab 06/08/21 0436  AST 10*  ALT 15  ALKPHOS 73  BILITOT 1.1  PROT 5.7*  ALBUMIN 3.0*    Recent Labs  Lab 06/08/21 0436  AMMONIA <10    CBC: Recent Labs  Lab 06/03/21 0505 06/04/21 0272 06/05/21 0422 06/06/21 0635 06/07/21 0508  WBC 26.2* 26.9* 22.8* 15.9* 18.4*  HGB 9.6* 9.8* 8.5* 7.7* 8.4*  HCT 31.1* 32.0* 27.7* 25.9*  29.1*  MCV 90.9 90.4 88.8 89.3 92.1  PLT 479* 467* 351 254 276     Recent Results (from the past 240 hour(s))  CULTURE, BLOOD (ROUTINE X 2) w Reflex to ID Panel     Status: None   Collection Time: 06/02/21 12:59 AM   Specimen: BLOOD  Result Value Ref Range Status   Specimen Description BLOOD RIGHT HAND  Final   Special Requests   Final    BOTTLES DRAWN AEROBIC AND ANAEROBIC Blood Culture adequate volume   Culture   Final    NO GROWTH 5 DAYS Performed at [facility], [address] [place], [place] [address]    Report Status 06/07/2021 FINAL  Final  CULTURE, BLOOD (ROUTINE X 2) w Reflex to ID Panel     Status: None   Collection Time: 06/02/21  1:10 AM   Specimen: BLOOD  Result Value Ref Range Status   Specimen Description BLOOD RIGHT ASSIST CONTROL  Final   Special Requests   Final    BOTTLES DRAWN AEROBIC AND ANAEROBIC Blood Culture adequate volume   Culture   Final    NO GROWTH 5 DAYS Performed at [facility], [address] [place], [place] [address]    Report Status 06/07/2021 FINAL  Final  Aerobic Culture w Gram Stain (superficial specimen)     Status: None   Collection Time: 06/02/21  1:27 PM   Specimen: KNEE; Wound  Result Value Ref Range Status   Specimen Description   Final    KNEE Performed at [facility], [address] [place], [place] [address]    Special Requests   Final    NONE Performed at [facility], [address] [place], [place] [address]    Gram Stain   Final    NO SQUAMOUS EPITHELIAL CELLS PRESENT FEW WBC PRESENT, PREDOMINANTLY MONONUCLEAR RARE GRAM POSITIVE COCCI Performed at [facility], [place] [address] [place], [place] [address]    Culture RARE STAPHYLOCOCCUS AUREUS  Final   Report Status 06/06/2021 FINAL  Final   Organism ID, Bacteria STAPHYLOCOCCUS AUREUS  Final      Susceptibility   Staphylococcus aureus - MIC*    CIPROFLOXACIN >=8 RESISTANT Resistant     ERYTHROMYCIN >=8  RESISTANT Resistant     GENTAMICIN <=0.5 SENSITIVE Sensitive     OXACILLIN 0.5 SENSITIVE Sensitive     TETRACYCLINE <=1 SENSITIVE Sensitive     VANCOMYCIN <=0.5 SENSITIVE Sensitive     TRIMETH/SULFA <=10 SENSITIVE Sensitive     CLINDAMYCIN <=0.25 SENSITIVE Sensitive     RIFAMPIN <=0.5 SENSITIVE Sensitive     Inducible Clindamycin NEGATIVE Sensitive     * RARE STAPHYLOCOCCUS AUREUS  Resp Panel by RT-PCR (Flu A&B, Covid) Nasopharyngeal Swab     Status: None   Collection Time: 06/08/21  1:15 PM   Specimen: Nasopharyngeal Swab; Nasopharyngeal(NP) swabs in vial transport medium  Result Value Ref Range Status   SARS Coronavirus 2 by RT PCR NEGATIVE NEGATIVE Final    Comment: (NOTE) SARS-CoV-2 target nucleic acids are NOT DETECTED.  The SARS-CoV-2 RNA is generally detectable in upper respiratory specimens during the acute phase of infection. The lowest concentration of SARS-CoV-2 viral copies this assay can detect is 138 copies/mL. A negative result does not preclude SARS-Cov-2 infection and should not be used as the sole basis for treatment or other patient management decisions. A negative result may occur with  improper specimen collection/handling, submission of specimen other than nasopharyngeal swab, presence of viral mutation(s) within the areas targeted by this assay, and inadequate number of viral copies(<138 copies/mL). A negative result must be combined with clinical observations, patient history, and epidemiological information. The expected result is Negative.  Fact Sheet for Patients:  EntrepreneurPulse.com.au  Fact Sheet for Healthcare Providers:  IncredibleEmployment.be  This test is no t yet approved or cleared by the [place] FDA and  has been authorized for detection and/or diagnosis of SARS-CoV-2 by FDA under an Emergency Use Authorization (EUA). This EUA will remain  in effect (meaning this test can be used) for the duration  of the COVID-19 declaration under Section  564(b)(1) of the Act, 21 U.S.C.section 360bbb-3(b)(1), unless the authorization is terminated  or revoked sooner.       Influenza A by PCR NEGATIVE NEGATIVE Final   Influenza B by PCR NEGATIVE NEGATIVE Final    Comment: (NOTE) The Xpert Xpress SARS-CoV-2/FLU/RSV plus assay is intended as an aid in the diagnosis of influenza from Nasopharyngeal swab specimens and should not be used as a sole basis for treatment. Nasal washings and aspirates are unacceptable for Xpert Xpress SARS-CoV-2/FLU/RSV testing.  Fact Sheet for Patients: EntrepreneurPulse.com.au  Fact Sheet for Healthcare Providers: IncredibleEmployment.be  This test is not yet approved or cleared by the [place] FDA and has been authorized for detection and/or diagnosis of SARS-CoV-2 by FDA under an Emergency Use Authorization (EUA). This EUA will remain in effect (meaning this test can be used) for the duration of the COVID-19 declaration under Section 564(b)(1) of the Act, 21 U.S.C. section 360bbb-3(b)(1), unless the authorization is terminated or revoked.  Performed at [facility], [address] [place], [place] [address]       Time spent in discharge (includes decision making & examination of pt): 35 minutes  06/08/2021, 3:26 PM   Cherene Altes, MD [facility] Office  [phone]

## 2021-06-08 NOTE — TOC Progression Note (Signed)
Transition of Care [facility]) - Progression Note    Patient Details  Name: Paula Torres MRN: [id number] Date of Birth: [birth year]  Transition of Care [facility]) CM/SW Contact  Shelbie Hutching, RN Phone Number: 06/08/2021, 12:48 PM  Clinical Narrative:     Patient looks much better today and is able to discuss discharge planning.  [facility].  [facility] chooses [facility].  Patient is medically cleared for discharge today.  RNCM will start insurance authorization through [place].  PT and OT are working with patient today and will put in updated evaluations.    Expected Discharge Plan: [place] Barriers to Discharge: Continued Medical Work up  Expected Discharge Plan and Services Expected Discharge Plan: [place]   Discharge Planning Services: CM Consult Post Acute Care Choice: [place] Living arrangements for the past 2 months: Single Family Home                 DME Arranged: N/A DME Agency: NA       HH Arranged: NA           Social Determinants of Health (SDOH) Interventions    Readmission Risk Interventions No flowsheet data found.

## 2021-06-08 NOTE — Plan of Care (Signed)
Patient non-compliant with taking medications at start of shift. Education provided. She would not respond to questions, only speaking when she had requests. Transferred from chair to bed requiring 3-4 person assist; however patient was able to sit up in bed independently once transferred. Very large BM this shift, urine output adequate. Wore Bipap for about an hour before removing, replaced with [place]. Dressing to left knee changed per wound care orders at 0500. Up this morning eating her snacks with a coke without complaint.   Problem: Clinical Measurements: Goal: Ability to maintain clinical measurements within normal limits will improve Outcome: Progressing Goal: Will remain free from infection Outcome: Progressing Goal: Diagnostic test results will improve Outcome: Progressing Goal: Respiratory complications will improve Outcome: Progressing Goal: Cardiovascular complication will be avoided Outcome: Progressing   Problem: Education: Goal: Knowledge of General Education information will improve Description: Including pain rating scale, medication(s)/side effects and non-pharmacologic comfort measures Outcome: Progressing   Problem: Coping: Goal: Level of anxiety will decrease Outcome: Progressing   Problem: Activity: Goal: Risk for activity intolerance will decrease Outcome: Progressing   Problem: Elimination: Goal: Will not experience complications related to bowel motility Outcome: Progressing Goal: Will not experience complications related to urinary retention Outcome: Progressing   Problem: Pain Managment: Goal: General experience of comfort will improve Outcome: Progressing   Problem: Safety: Goal: Ability to remain free from injury will improve Outcome: Progressing   Problem: Skin Integrity: Goal: Risk for impaired skin integrity will decrease Outcome: Progressing   Problem: Clinical Measurements: Goal: Ability to avoid or minimize complications of infection will  improve Outcome: Progressing

## 2021-06-09 NOTE — ED Notes (Addendum)
Pt is requesting a purewick to be placed. When rolling her to place it, she had stool all over her pad from the facility. Cleaned pt up and placed new pads. Purewick in place and draining. Repositioned pt in bed and provided a warm blanket. Daughter remains at the bedside. O2 sats are maintaining at 93-95% on 5L/O2

## 2021-06-09 NOTE — ED Notes (Signed)
Updated pt and daughter with verbal understanding, awaiting transport back to [facility]. VSS, No acute distress noted

## 2021-06-09 NOTE — ED Provider Notes (Signed)
[facility] Emergency Department Provider Note  ____________________________________________   Event Date/Time   First MD Initiated Contact with Patient 06/08/21 2357     (approximate)  I have reviewed the triage vital signs and the nursing notes.   HISTORY  Chief Complaint Oxygen Needs    HPI Paula Torres is a 70 y.o. female with extensive chronic medical history as listed below which notably includes chronic respiratory failure on about 5 L of oxygen at baseline with COPD and CHF as well as morbid obesity and OSA.  She presents tonight for hypoxemia.  She was discharged from [facility] earlier in the day to go to a skilled nursing rehab facility ([facility]).  Reportedly she was satting in the low 80s in spite of being on 5 L of oxygen.  [facility] reports that they did not have a concentrator to bring her oxygen level up higher, so EMS was called.  She has been on 5 to 6 L of oxygen in the emergency department and has been satting in the mid 90s.  She is always short of breath and said she feels no worse than usual.  She has not had any recent fever and is not having chest pain.  She said she is at her baseline and she and her daughter, who is at bedside, reported that they are here because they were told that the facility did not have the oxygen resources that she needs.       Past Medical History:  Diagnosis Date   (HFpEF) heart failure with preserved ejection fraction (HCC)    Chronic respiratory failure with hypoxia (HCC)    COPD (chronic obstructive pulmonary disease) (HCC)    Coronary artery disease, non-occlusive    Hypertension    OSA (obstructive sleep apnea)    PAF (paroxysmal atrial fibrillation) ([place])    Pulmonary hypertension ([place])     Patient Active Problem List   Diagnosis Date Noted   Wound infection_left lower leg 05/28/2021   COPD (chronic obstructive pulmonary disease) ([place]) 05/28/2021   Chronic diastolic CHF  (congestive heart failure) ([place]) 05/28/2021   Iron deficiency anemia 05/28/2021   Cellulitis of left lower leg 05/28/2021   Acute on chronic respiratory failure ([place]) 04/25/2021   Acute on chronic respiratory failure with hypoxia (HCC) 05/11/2020   AF (paroxysmal atrial fibrillation) ([place]) 05/11/2020   Acute on chronic diastolic congestive heart failure (HCC)    Acute respiratory distress    Pulmonary hypertension (HCC)    Restless leg syndrome    Depression    Atrial fibrillation with RVR ([place]) 01/21/2020   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[place]) 01/21/2020   Abnormal cardiovascular stress test    Demand ischemia (HCC)    Hyperlipidemia    COPD with acute exacerbation ([place]) 11/08/2019   Acute on chronic diastolic CHF (congestive heart failure) ([place]) 11/08/2019   Acute respiratory failure with hypoxia and hypercapnia (HCC) 11/08/2019   Obesity, Class III, BMI 40-49.9 (morbid obesity) ([place]) 11/08/2019   Elevated troponin 11/08/2019    Past Surgical History:  Procedure Laterality Date   NO PAST SURGERIES     RIGHT/LEFT HEART CATH AND CORONARY ANGIOGRAPHY Left 12/21/2019   Procedure: RIGHT/LEFT HEART CATH AND CORONARY ANGIOGRAPHY;  Surgeon: Wellington Hampshire, MD;  Location: [facility];  Service: Cardiovascular;  Laterality: Left;    Prior to Admission medications   Medication Sig Start Date End Date Taking? Authorizing Provider  acetaminophen (TYLENOL) 325 MG tablet Take 2 tablets (650  mg total) by mouth every 6 (six) hours as needed for mild pain, fever, moderate pain or headache. 06/08/21   Cherene Altes, MD  atorvastatin (LIPITOR) 20 MG tablet Take 1 tablet (20 mg total) by mouth daily at 6 PM. Patient taking differently: Take 20 mg by mouth at bedtime. 11/23/19   Loel Dubonnet, NP  bisoprolol (ZEBETA) 10 MG tablet Take 1 tablet (10 mg total) by mouth 2 (two) times daily. 06/08/21   Cherene Altes, MD  cefadroxil (DURICEF) 500 MG capsule Take 2  capsules (1,000 mg total) by mouth 2 (two) times daily for 7 days. 06/08/21 06/15/21  Cherene Altes, MD  collagenase (SANTYL) ointment Apply topically daily. 06/09/21   Cherene Altes, MD  dextromethorphan-guaiFENesin (MUCINEX DM) 30-600 MG 12hr tablet Take 1 tablet by mouth 2 (two) times daily as needed for cough. 06/08/21   Cherene Altes, MD  diltiazem (CARDIZEM) 60 MG tablet Take 1 tablet (60 mg total) by mouth every 8 (eight) hours. 06/08/21   Cherene Altes, MD  docusate sodium (COLACE) 100 MG capsule Take 1 tablet once or twice daily as needed for constipation while taking narcotic pain medicine 05/25/21   Hinda Kehr, MD  famotidine (PEPCID) 20 MG tablet Take 20 mg by mouth at bedtime.    [provider]  ferrous gluconate (FERGON) 324 MG tablet Take 324 mg by mouth once a week.    [provider]  fluticasone (FLONASE) 50 MCG/ACT nasal spray Place 2 sprays into both nostrils daily as needed for allergies.    [provider]  Fluticasone-Umeclidin-Vilant 100-62.5-25 MCG/INH AEPB Inhale 1 puff into the lungs daily.    [provider]  folic acid (FOLVITE) 1 MG tablet Take 1 tablet (1 mg total) by mouth daily. 06/09/21   Cherene Altes, MD  levalbuterol Penne Lash) 0.63 MG/3ML nebulizer solution Take 3 mLs (0.63 mg total) by nebulization every 4 (four) hours as needed for wheezing or shortness of breath. 06/08/21   Cherene Altes, MD  levalbuterol (XOPENEX) 1.25 MG/0.5ML nebulizer solution Take 1.25 mg by nebulization 3 (three) times daily. 06/08/21   Cherene Altes, MD  loratadine (CLARITIN) 10 MG tablet Take 1 tablet (10 mg total) by mouth daily. Patient taking differently: Take 10 mg by mouth at bedtime. 05/13/20   Nicole Kindred A, DO  montelukast (SINGULAIR) 10 MG tablet Take 10 mg by mouth at bedtime.    [provider]  Treprostinil Diolamine ER 0.25 MG TBCR Take 0.25 mg by mouth in the morning and at bedtime.    [provider]  Vitamin D, Ergocalciferol, (DRISDOL) 1.25 MG (50000 UNIT) CAPS capsule Take 50,000 Units by mouth every Saturday.    [provider]    Allergies Codeine and Sulfa antibiotics  Family History  Problem Relation Age of Onset   Hypertension Other     Social History Social History   Tobacco Use   Smoking status: Former    Types: Cigarettes    Quit date: 06/03/2017    Years since quitting: 4.0   Smokeless tobacco: Never  Vaping Use   Vaping Use: Never used  Substance Use Topics   Alcohol use: Not Currently   Drug use: Never    Review of Systems Constitutional: No fever/chills Eyes: No visual changes. ENT: No sore throat. Cardiovascular: Denies chest pain. Respiratory: Chronic shortness of breath.  Reportedly with hypoxemia on her baseline oxygen level. Gastrointestinal: No abdominal pain.  No nausea, no  vomiting.  No diarrhea.  No constipation. Genitourinary: Negative for dysuria. Musculoskeletal: Negative for neck pain.  Negative for back pain. Integumentary: Negative for rash. Neurological: Negative for headaches, focal weakness or numbness.   ____________________________________________   PHYSICAL EXAM:  VITAL SIGNS: ED Triage Vitals [06/08/21 2244]  Enc Vitals Group     BP 119/66     Pulse Rate 67     Resp (!) 22     Temp 97.8 F (36.6 C)     Temp Source Oral     SpO2 94 %     Weight 122.7 kg (270 lb 8.1 oz)     Height 1.651 m (_0 )     Head Circumference      [facility]      Pain Score 0     Pain Loc      Pain Edu?      Excl. in [place]?     Constitutional: Alert and oriented.  Chronically ill but not in acute distress. Eyes: Conjunctivae are normal.  Head: Atraumatic. Nose: No congestion/rhinnorhea. Mouth/Throat: Patient is wearing a mask. Neck: No stridor.  No meningeal signs.   Cardiovascular: Normal rate, regular rhythm. Good peripheral circulation. Respiratory: Slightly increased respiratory effort which is her baseline  (I have seen her in the past and can confirm this, as did she and her daughter).  She does pursed lip breathing to auto PEEP at baseline.  She was initially on 6 L of oxygen when I entered the room and satting about 95%.  I turned her down to 5 L.  See below for additional details. Gastrointestinal: Morbid obesity.  Soft and nontender. No distention.  Musculoskeletal: Chronic peripheral edema and bilateral lower extremities. Neurologic:  Normal speech and language. No gross focal neurologic deficits are appreciated.  Skin:  Skin is warm and dry.  She has chronic wounds on her lower extremities that do not appear acutely infected.   ____________________________________________    INITIAL IMPRESSION / MDM / ASSESSMENT AND PLAN / ED COURSE  As part of my medical decision making, I reviewed the following data within the [facility] History obtained from family, Nursing notes reviewed and incorporated, and Notes from prior ED visits  Differential diagnosis includes, but is not limited to, chronic respiratory failure, PE, acute respiratory failure hypercapnia or hypoxemia.  This may simply be an issue of her pulse oximeter not showing a good waveform and falsely reading a low value.  She has had reassuring oxygen levels in the ED given that her daughter reports that her baseline is between 88% and 92%.  During the time I was in the room, the patient was moving around so the nurses could help get her cleaned up and change her, and her oxygen drops with any amount of exertion but comes right back up again immediately if she breathes through her nose.  There does not seem to be any evidence of an emergent medical condition.  Our charge nurse called the facility and they report that they should be able to get an oxygen concentrator tomorrow but cannot get it tonight.  However I think she will likely be acceptable for discharge back to the facility on the maximum they can provide which is 5 L.   However we will continue to monitor her with a pulse oximeter on 5 L to see how she does here for a couple of hours.  The patient and the daughter understand and agree with the plan.  Clinical Course as of 06/09/21 7741  Ludwig Clarks Jun 09, 2021  0213 The patient has been in the low to mid 90s the entire time she has been in her ED exam room.  She is asking when she can leave.  I think it is very appropriate for her to be discharged on the 5 L of oxygen she has available to her at the facility and they can follow-up tomorrow to see about getting an oxygen concentrator to be able to increase the O2 concentration if needed.  There is no evidence of an emergent medical condition or any higher oxygen requirements tonight. [CF]    Clinical Course User Index [CF] Hinda Kehr, MD     ____________________________________________  FINAL CLINICAL IMPRESSION(S) / ED DIAGNOSES  Final diagnoses:  Chronic respiratory failure with hypoxia ([place])     MEDICATIONS GIVEN DURING THIS VISIT:  Medications - No data to display   ED Discharge Orders     None        Note:  This document was prepared using Dragon voice recognition software and may include unintentional dictation errors.   Hinda Kehr, MD 06/09/21 [phone]

## 2021-06-09 NOTE — ED Notes (Signed)
Report called to Timmothy Sours, Therapist, sports at [facility]

## 2021-06-09 NOTE — Discharge Instructions (Addendum)
Please continue on your oxygen and with all of your regular medications.  You may benefit from getting a higher level of oxygen when possible tomorrow when the equipment is available, but you should be fine on the 5 L that are able to be provided at this time.  Please follow-up with your regular doctor at the next available opportunity.

## 2021-06-09 NOTE — ED Notes (Signed)
Called EMS for transport back to [facility]

## 2021-06-12 ENCOUNTER — Emergency Department: Payer: Medicare Other

## 2021-06-12 ENCOUNTER — Encounter: Payer: Self-pay | Admitting: Emergency Medicine

## 2021-06-12 ENCOUNTER — Other Ambulatory Visit: Payer: Self-pay

## 2021-06-12 ENCOUNTER — Inpatient Hospital Stay
Admission: EM | Admit: 2021-06-12 | Discharge: 2021-07-04 | DRG: 208 | Disposition: E | Payer: Medicare Other | Source: Skilled Nursing Facility | Attending: Internal Medicine | Admitting: Internal Medicine

## 2021-06-12 DIAGNOSIS — E66813 Obesity, class 3: Secondary | ICD-10-CM | POA: Diagnosis present

## 2021-06-12 DIAGNOSIS — F419 Anxiety disorder, unspecified: Secondary | ICD-10-CM | POA: Diagnosis present

## 2021-06-12 DIAGNOSIS — G4733 Obstructive sleep apnea (adult) (pediatric): Secondary | ICD-10-CM | POA: Diagnosis not present

## 2021-06-12 DIAGNOSIS — T148XXA Other injury of unspecified body region, initial encounter: Secondary | ICD-10-CM

## 2021-06-12 DIAGNOSIS — J1282 Pneumonia due to coronavirus disease 2019: Secondary | ICD-10-CM | POA: Diagnosis not present

## 2021-06-12 DIAGNOSIS — J9621 Acute and chronic respiratory failure with hypoxia: Secondary | ICD-10-CM | POA: Diagnosis present

## 2021-06-12 DIAGNOSIS — J441 Chronic obstructive pulmonary disease with (acute) exacerbation: Principal | ICD-10-CM | POA: Diagnosis present

## 2021-06-12 DIAGNOSIS — E872 Acidosis, unspecified: Secondary | ICD-10-CM | POA: Diagnosis not present

## 2021-06-12 DIAGNOSIS — Z87891 Personal history of nicotine dependence: Secondary | ICD-10-CM | POA: Diagnosis not present

## 2021-06-12 DIAGNOSIS — E662 Morbid (severe) obesity with alveolar hypoventilation: Secondary | ICD-10-CM | POA: Diagnosis present

## 2021-06-12 DIAGNOSIS — J440 Chronic obstructive pulmonary disease with acute lower respiratory infection: Secondary | ICD-10-CM | POA: Diagnosis present

## 2021-06-12 DIAGNOSIS — I2510 Atherosclerotic heart disease of native coronary artery without angina pectoris: Secondary | ICD-10-CM | POA: Diagnosis present

## 2021-06-12 DIAGNOSIS — I480 Paroxysmal atrial fibrillation: Secondary | ICD-10-CM | POA: Diagnosis present

## 2021-06-12 DIAGNOSIS — Z515 Encounter for palliative care: Secondary | ICD-10-CM

## 2021-06-12 DIAGNOSIS — U071 COVID-19: Secondary | ICD-10-CM | POA: Diagnosis not present

## 2021-06-12 DIAGNOSIS — Z882 Allergy status to sulfonamides status: Secondary | ICD-10-CM

## 2021-06-12 DIAGNOSIS — A419 Sepsis, unspecified organism: Secondary | ICD-10-CM | POA: Diagnosis present

## 2021-06-12 DIAGNOSIS — Z79899 Other long term (current) drug therapy: Secondary | ICD-10-CM

## 2021-06-12 DIAGNOSIS — J189 Pneumonia, unspecified organism: Secondary | ICD-10-CM | POA: Diagnosis present

## 2021-06-12 DIAGNOSIS — R0902 Hypoxemia: Secondary | ICD-10-CM | POA: Diagnosis not present

## 2021-06-12 DIAGNOSIS — J80 Acute respiratory distress syndrome: Secondary | ICD-10-CM | POA: Diagnosis present

## 2021-06-12 DIAGNOSIS — I2781 Cor pulmonale (chronic): Secondary | ICD-10-CM | POA: Diagnosis present

## 2021-06-12 DIAGNOSIS — J9622 Acute and chronic respiratory failure with hypercapnia: Secondary | ICD-10-CM | POA: Diagnosis not present

## 2021-06-12 DIAGNOSIS — J9811 Atelectasis: Secondary | ICD-10-CM

## 2021-06-12 DIAGNOSIS — D509 Iron deficiency anemia, unspecified: Secondary | ICD-10-CM | POA: Diagnosis present

## 2021-06-12 DIAGNOSIS — Z4659 Encounter for fitting and adjustment of other gastrointestinal appliance and device: Secondary | ICD-10-CM

## 2021-06-12 DIAGNOSIS — I272 Pulmonary hypertension, unspecified: Secondary | ICD-10-CM | POA: Diagnosis present

## 2021-06-12 DIAGNOSIS — L8990 Pressure ulcer of unspecified site, unspecified stage: Secondary | ICD-10-CM | POA: Insufficient documentation

## 2021-06-12 DIAGNOSIS — Z885 Allergy status to narcotic agent status: Secondary | ICD-10-CM

## 2021-06-12 DIAGNOSIS — F32A Depression, unspecified: Secondary | ICD-10-CM | POA: Diagnosis present

## 2021-06-12 DIAGNOSIS — Z7901 Long term (current) use of anticoagulants: Secondary | ICD-10-CM

## 2021-06-12 DIAGNOSIS — I2729 Other secondary pulmonary hypertension: Secondary | ICD-10-CM | POA: Diagnosis present

## 2021-06-12 DIAGNOSIS — Z66 Do not resuscitate: Secondary | ICD-10-CM | POA: Diagnosis not present

## 2021-06-12 DIAGNOSIS — L089 Local infection of the skin and subcutaneous tissue, unspecified: Secondary | ICD-10-CM | POA: Diagnosis not present

## 2021-06-12 DIAGNOSIS — Y95 Nosocomial condition: Secondary | ICD-10-CM | POA: Diagnosis present

## 2021-06-12 DIAGNOSIS — J9601 Acute respiratory failure with hypoxia: Secondary | ICD-10-CM

## 2021-06-12 DIAGNOSIS — Z7189 Other specified counseling: Secondary | ICD-10-CM | POA: Diagnosis not present

## 2021-06-12 DIAGNOSIS — I5033 Acute on chronic diastolic (congestive) heart failure: Secondary | ICD-10-CM | POA: Diagnosis present

## 2021-06-12 DIAGNOSIS — N1831 Chronic kidney disease, stage 3a: Secondary | ICD-10-CM | POA: Diagnosis present

## 2021-06-12 DIAGNOSIS — I5032 Chronic diastolic (congestive) heart failure: Secondary | ICD-10-CM | POA: Diagnosis not present

## 2021-06-12 DIAGNOSIS — Z01818 Encounter for other preprocedural examination: Secondary | ICD-10-CM

## 2021-06-12 DIAGNOSIS — Z5320 Procedure and treatment not carried out because of patient's decision for unspecified reasons: Secondary | ICD-10-CM | POA: Diagnosis not present

## 2021-06-12 DIAGNOSIS — G928 Other toxic encephalopathy: Secondary | ICD-10-CM | POA: Diagnosis not present

## 2021-06-12 DIAGNOSIS — I130 Hypertensive heart and chronic kidney disease with heart failure and stage 1 through stage 4 chronic kidney disease, or unspecified chronic kidney disease: Secondary | ICD-10-CM | POA: Diagnosis present

## 2021-06-12 DIAGNOSIS — K219 Gastro-esophageal reflux disease without esophagitis: Secondary | ICD-10-CM | POA: Diagnosis present

## 2021-06-12 DIAGNOSIS — J9602 Acute respiratory failure with hypercapnia: Secondary | ICD-10-CM | POA: Diagnosis not present

## 2021-06-12 DIAGNOSIS — E785 Hyperlipidemia, unspecified: Secondary | ICD-10-CM | POA: Diagnosis present

## 2021-06-12 DIAGNOSIS — R54 Age-related physical debility: Secondary | ICD-10-CM | POA: Diagnosis present

## 2021-06-12 LAB — BLOOD GAS, VENOUS
Acid-Base Excess: 8.4 mmol/L — ABNORMAL HIGH (ref 0.0–2.0)
Acid-Base Excess: 14.5 mmol/L — ABNORMAL HIGH (ref 0.0–2.0)
Bicarbonate: 37.7 mmol/L — ABNORMAL HIGH (ref 20.0–28.0)
Bicarbonate: 42.6 mmol/L — ABNORMAL HIGH (ref 20.0–28.0)
O2 Saturation: 49.5 %
O2 Saturation: 64.9 %
Patient temperature: 37
Patient temperature: 37
pCO2, Ven: 88 mmHg (ref 44.0–60.0)
pCO2, Ven: 79 mmHg (ref 44.0–60.0)
pH, Ven: 7.24 — ABNORMAL LOW (ref 7.250–7.430)
pH, Ven: 7.34 (ref 7.250–7.430)
pO2, Ven: 32 mmHg (ref 32.0–45.0)
pO2, Ven: 36 mmHg (ref 32.0–45.0)

## 2021-06-12 LAB — COMPREHENSIVE METABOLIC PANEL
ALT: 13 U/L (ref 0–44)
AST: 12 U/L — ABNORMAL LOW (ref 15–41)
Albumin: 3.3 g/dL — ABNORMAL LOW (ref 3.5–5.0)
Alkaline Phosphatase: 88 U/L (ref 38–126)
Anion gap: 7 (ref 5–15)
BUN: 18 mg/dL (ref 8–23)
CO2: 38 mmol/L — ABNORMAL HIGH (ref 22–32)
Calcium: 8.8 mg/dL — ABNORMAL LOW (ref 8.9–10.3)
Chloride: 94 mmol/L — ABNORMAL LOW (ref 98–111)
Creatinine, Ser: 0.99 mg/dL (ref 0.44–1.00)
GFR, Estimated: >60 mL/min (ref >60)
Glucose, Bld: 108 mg/dL — ABNORMAL HIGH (ref 70–99)
Potassium: 4.3 mmol/L (ref 3.5–5.1)
Sodium: 139 mmol/L (ref 135–145)
Total Bilirubin: 1.1 mg/dL (ref 0.3–1.2)
Total Protein: 6.1 g/dL — ABNORMAL LOW (ref 6.5–8.1)

## 2021-06-12 LAB — RESP PANEL BY RT-PCR (FLU A&B, COVID) ARPGX2
Influenza A by PCR: NEGATIVE
Influenza B by PCR: NEGATIVE
SARS Coronavirus 2 by RT PCR: NEGATIVE

## 2021-06-12 LAB — CBC WITH DIFFERENTIAL/PLATELET
Abs Immature Granulocytes: 0.1 10*3/uL — ABNORMAL HIGH (ref 0.00–0.07)
Basophils Absolute: 0.1 10*3/uL (ref 0.0–0.1)
Basophils Relative: 0 %
Eosinophils Absolute: 0.1 10*3/uL (ref 0.0–0.5)
Eosinophils Relative: 1 %
HCT: 32.4 % — ABNORMAL LOW (ref 36.0–46.0)
Hemoglobin: 9.3 g/dL — ABNORMAL LOW (ref 12.0–15.0)
Immature Granulocytes: 1 %
Lymphocytes Relative: 3 %
Lymphs Abs: 0.6 10*3/uL — ABNORMAL LOW (ref 0.7–4.0)
MCH: 26.7 pg (ref 26.0–34.0)
MCHC: 28.7 g/dL — ABNORMAL LOW (ref 30.0–36.0)
MCV: 93.1 fL (ref 80.0–100.0)
Monocytes Absolute: 0.8 10*3/uL (ref 0.1–1.0)
Monocytes Relative: 4 %
Neutro Abs: 16.5 10*3/uL — ABNORMAL HIGH (ref 1.7–7.7)
Neutrophils Relative %: 91 %
Platelets: 322 10*3/uL (ref 150–400)
RBC: 3.48 MIL/uL — ABNORMAL LOW (ref 3.87–5.11)
RDW: 18.7 % — ABNORMAL HIGH (ref 11.5–15.5)
WBC: 18.2 10*3/uL — ABNORMAL HIGH (ref 4.0–10.5)
nRBC: 0 % (ref 0.0–0.2)

## 2021-06-12 LAB — APTT: aPTT: 32 seconds (ref 24–36)

## 2021-06-12 LAB — LACTIC ACID, PLASMA: Lactic Acid, Venous: 1 mmol/L (ref 0.5–1.9)

## 2021-06-12 LAB — PROCALCITONIN: Procalcitonin: <0.1 ng/mL

## 2021-06-12 LAB — PROTIME-INR
INR: 1 (ref 0.8–1.2)
Prothrombin Time: 13.4 seconds (ref 11.4–15.2)

## 2021-06-12 LAB — BRAIN NATRIURETIC PEPTIDE: B Natriuretic Peptide: 515.4 pg/mL — ABNORMAL HIGH (ref 0.0–100.0)

## 2021-06-12 MED ORDER — SODIUM CHLORIDE 0.9 % IV SOLN
500.0000 mg | Freq: Once | INTRAVENOUS | Status: AC
  Administered 2021-06-12: 500 mg via INTRAVENOUS
  Filled 2021-06-12: qty 500

## 2021-06-12 MED ORDER — HYDRALAZINE HCL 20 MG/ML IJ SOLN: 5.0000 mg | INTRAMUSCULAR | Status: DC | PRN

## 2021-06-12 MED ORDER — LEVALBUTEROL HCL 0.63 MG/3ML IN NEBU
0.6300 mg | INHALATION_SOLUTION | Freq: Four times a day (QID) | RESPIRATORY_TRACT | Status: DC | PRN
  Administered 2021-06-17 – 2021-06-19 (×3): 0.63 mg via RESPIRATORY_TRACT
  Filled 2021-06-12 (×5): qty 3

## 2021-06-12 MED ORDER — METHYLPREDNISOLONE SODIUM SUCC 125 MG IJ SOLR
125.0000 mg | Freq: Once | INTRAMUSCULAR | Status: AC
  Administered 2021-06-12: 125 mg via INTRAVENOUS
  Filled 2021-06-12: qty 2

## 2021-06-12 MED ORDER — VANCOMYCIN HCL 1750 MG/350ML IV SOLN
1750.0000 mg | INTRAVENOUS | Status: DC
  Administered 2021-06-13: 1750 mg via INTRAVENOUS
  Filled 2021-06-12: qty 350

## 2021-06-12 MED ORDER — ONDANSETRON HCL 4 MG/2ML IJ SOLN: 4.0000 mg | Freq: Three times a day (TID) | INTRAMUSCULAR | Status: DC | PRN

## 2021-06-12 MED ORDER — DIPHENHYDRAMINE HCL 50 MG/ML IJ SOLN: 25.0000 mg | Freq: Once | INTRAMUSCULAR | Status: DC

## 2021-06-12 MED ORDER — IPRATROPIUM-ALBUTEROL 0.5-2.5 (3) MG/3ML IN SOLN
3.0000 mL | Freq: Once | RESPIRATORY_TRACT | Status: AC
  Administered 2021-06-12: 3 mL via RESPIRATORY_TRACT
  Filled 2021-06-12: qty 3

## 2021-06-12 MED ORDER — SODIUM CHLORIDE 0.9 % IV SOLN
2.0000 g | Freq: Three times a day (TID) | INTRAVENOUS | Status: DC
  Administered 2021-06-12 – 2021-06-14 (×6): 2 g via INTRAVENOUS
  Filled 2021-06-12 (×9): qty 2

## 2021-06-12 MED ORDER — VANCOMYCIN HCL 1500 MG/300ML IV SOLN
1500.0000 mg | Freq: Once | INTRAVENOUS | Status: AC
  Administered 2021-06-12: 1500 mg via INTRAVENOUS
  Filled 2021-06-12: qty 300

## 2021-06-12 MED ORDER — SODIUM CHLORIDE 0.9 % IV SOLN
2.0000 g | Freq: Once | INTRAVENOUS | Status: AC
  Administered 2021-06-12: 2 g via INTRAVENOUS
  Filled 2021-06-12: qty 2

## 2021-06-12 MED ORDER — ACETAMINOPHEN 325 MG PO TABS
650.0000 mg | ORAL_TABLET | Freq: Four times a day (QID) | ORAL | Status: DC | PRN
  Administered 2021-06-19: 650 mg via ORAL
  Filled 2021-06-12: qty 2

## 2021-06-12 MED ORDER — IPRATROPIUM BROMIDE 0.02 % IN SOLN
0.5000 mg | RESPIRATORY_TRACT | Status: DC
  Administered 2021-06-12 – 2021-06-13 (×4): 0.5 mg via RESPIRATORY_TRACT
  Filled 2021-06-12 (×4): qty 2.5

## 2021-06-12 MED ORDER — VANCOMYCIN HCL IN DEXTROSE 1-5 GM/200ML-% IV SOLN
1000.0000 mg | Freq: Once | INTRAVENOUS | Status: AC
  Administered 2021-06-12: 1000 mg via INTRAVENOUS
  Filled 2021-06-12: qty 200

## 2021-06-12 MED ORDER — APIXABAN 5 MG PO TABS
5.0000 mg | ORAL_TABLET | Freq: Two times a day (BID) | ORAL | Status: DC
  Administered 2021-06-12 – 2021-06-20 (×15): 5 mg via ORAL
  Filled 2021-06-12 (×16): qty 1

## 2021-06-12 MED ORDER — METHYLPREDNISOLONE SODIUM SUCC 125 MG IJ SOLR
60.0000 mg | Freq: Two times a day (BID) | INTRAMUSCULAR | Status: DC
  Administered 2021-06-13 – 2021-06-14 (×4): 60 mg via INTRAVENOUS
  Filled 2021-06-12 (×4): qty 2

## 2021-06-12 MED ORDER — LACTATED RINGERS IV SOLN: INTRAVENOUS | Status: DC

## 2021-06-12 MED ORDER — DM-GUAIFENESIN ER 30-600 MG PO TB12: 1.0000 | ORAL_TABLET | Freq: Two times a day (BID) | ORAL | Status: DC | PRN

## 2021-06-12 NOTE — ED Provider Notes (Signed)
[facility] Emergency Department Provider Note    Event Date/Time   First MD Initiated Contact with Patient 06/22/2021 1134     (approximate)  I have reviewed the triage vital signs and the nursing notes.   HISTORY  Chief Complaint Unresponsive  Level v caveat:  AMS  HPI Paula Torres is a 70 y.o. female extensive past medical history as below with recent hospitalization presents to the ER for altered mental status unknown last seen normal.  Patient will follow commands but would not provide any additional history.  Does appear encephalopathic.  End-tidal CO2 60.  Not hypoxic reportedly uncertain as to whether she wore her CPAP last night.  Does have large wound on the left anterior shin with recent admission for reported cellulitis and sepsis.  Reportedly is on antibiotics at home.  No report of any trauma.  Past Medical History:  Diagnosis Date   (HFpEF) heart failure with preserved ejection fraction (HCC)    Chronic respiratory failure with hypoxia (HCC)    COPD (chronic obstructive pulmonary disease) (HCC)    Coronary artery disease, non-occlusive    Hypertension    OSA (obstructive sleep apnea)    PAF (paroxysmal atrial fibrillation) ([place])    Pulmonary hypertension (HCC)    Family History  Problem Relation Age of Onset   Hypertension Other    Past Surgical History:  Procedure Laterality Date   NO PAST SURGERIES     RIGHT/LEFT HEART CATH AND CORONARY ANGIOGRAPHY Left 12/21/2019   Procedure: RIGHT/LEFT HEART CATH AND CORONARY ANGIOGRAPHY;  Surgeon: Wellington Hampshire, MD;  Location: [place] [facility];  Service: Cardiovascular;  Laterality: Left;   Patient Active Problem List   Diagnosis Date Noted   Wound infection_left lower leg 05/28/2021   COPD (chronic obstructive pulmonary disease) ([place]) 05/28/2021   Chronic diastolic CHF (congestive heart failure) ([place]) 05/28/2021   Iron deficiency anemia 05/28/2021   Cellulitis of left lower leg  05/28/2021   Acute on chronic respiratory failure ([place]) 04/25/2021   Acute on chronic respiratory failure with hypoxia (HCC) 05/11/2020   AF (paroxysmal atrial fibrillation) ([place]) 05/11/2020   Acute on chronic diastolic congestive heart failure (HCC)    Acute respiratory distress    Pulmonary hypertension (HCC)    Restless leg syndrome    Depression    Atrial fibrillation with RVR ([place]) 01/21/2020   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[place]) 01/21/2020   Abnormal cardiovascular stress test    Demand ischemia (HCC)    Hyperlipidemia    COPD with acute exacerbation ([place]) 11/08/2019   Acute on chronic diastolic CHF (congestive heart failure) ([place]) 11/08/2019   Acute respiratory failure with hypoxia and hypercapnia ([place]) 11/08/2019   Obesity, Class III, BMI 40-49.9 (morbid obesity) ([place]) 11/08/2019   Elevated troponin 11/08/2019      Prior to Admission medications   Medication Sig Start Date End Date Taking? Authorizing Provider  acetaminophen (TYLENOL) 325 MG tablet Take 2 tablets (650 mg total) by mouth every 6 (six) hours as needed for mild pain, fever, moderate pain or headache. 06/08/21   Cherene Altes, MD  atorvastatin (LIPITOR) 20 MG tablet Take 1 tablet (20 mg total) by mouth daily at 6 PM. Patient taking differently: Take 20 mg by mouth at bedtime. 11/23/19   Loel Dubonnet, NP  bisoprolol (ZEBETA) 10 MG tablet Take 1 tablet (10 mg total) by mouth 2 (two) times daily. 06/08/21   Cherene Altes, MD  cefadroxil (DURICEF) 500 MG capsule  Take 2 capsules (1,000 mg total) by mouth 2 (two) times daily for 7 days. 06/08/21 06/15/21  Cherene Altes, MD  collagenase (SANTYL) ointment Apply topically daily. 06/09/21   Cherene Altes, MD  dextromethorphan-guaiFENesin (MUCINEX DM) 30-600 MG 12hr tablet Take 1 tablet by mouth 2 (two) times daily as needed for cough. 06/08/21   Cherene Altes, MD  diltiazem (CARDIZEM) 60 MG tablet Take 1 tablet (60 mg total) by  mouth every 8 (eight) hours. 06/08/21   Cherene Altes, MD  docusate sodium (COLACE) 100 MG capsule Take 1 tablet once or twice daily as needed for constipation while taking narcotic pain medicine 05/25/21   Hinda Kehr, MD  famotidine (PEPCID) 20 MG tablet Take 20 mg by mouth at bedtime.    [provider]  ferrous gluconate (FERGON) 324 MG tablet Take 324 mg by mouth once a week.    [provider]  fluticasone (FLONASE) 50 MCG/ACT nasal spray Place 2 sprays into both nostrils daily as needed for allergies.    [provider]  Fluticasone-Umeclidin-Vilant 100-62.5-25 MCG/INH AEPB Inhale 1 puff into the lungs daily.    [provider]  folic acid (FOLVITE) 1 MG tablet Take 1 tablet (1 mg total) by mouth daily. 06/09/21   Cherene Altes, MD  levalbuterol Penne Lash) 0.63 MG/3ML nebulizer solution Take 3 mLs (0.63 mg total) by nebulization every 4 (four) hours as needed for wheezing or shortness of breath. 06/08/21   Cherene Altes, MD  levalbuterol (XOPENEX) 1.25 MG/0.5ML nebulizer solution Take 1.25 mg by nebulization 3 (three) times daily. 06/08/21   Cherene Altes, MD  loratadine (CLARITIN) 10 MG tablet Take 1 tablet (10 mg total) by mouth daily. Patient taking differently: Take 10 mg by mouth at bedtime. 05/13/20   Nicole Kindred A, DO  montelukast (SINGULAIR) 10 MG tablet Take 10 mg by mouth at bedtime.    [provider]  Treprostinil Diolamine ER 0.25 MG TBCR Take 0.25 mg by mouth in the morning and at bedtime.    [provider]  Vitamin D, Ergocalciferol, (DRISDOL) 1.25 MG (50000 UNIT) CAPS capsule Take 50,000 Units by mouth every Saturday.    [provider]    Allergies Codeine and Sulfa antibiotics    Social History Social History   Tobacco Use   Smoking status: Former    Types: Cigarettes    Quit date: 06/03/2017    Years since quitting: 4.0   Smokeless tobacco: Never  Vaping Use   Vaping Use: Never  used  Substance Use Topics   Alcohol use: Not Currently   Drug use: Never    Review of Systems Patient denies headaches, rhinorrhea, blurry vision, numbness, shortness of breath, chest pain, edema, cough, abdominal pain, nausea, vomiting, diarrhea, dysuria, fevers, rashes or hallucinations unless otherwise stated above in HPI. ____________________________________________   PHYSICAL EXAM:  VITAL SIGNS: Vitals:   06/18/2021 1431 06/19/2021 1503  BP: 100/64 99/69  Pulse: 89 75  Resp: (!) 22 15  Temp:    SpO2: 100% 100%    Constitutional: Drowsy tachypnic diffuse wheezing prolonged expiratory phase.  She not hypoxic she is following simple commands. Eyes: Conjunctivae are normal.  Head: Atraumatic. Nose: No congestion/rhinnorhea. Mouth/Throat: Mucous membranes are dry.   Neck: No stridor. Painless ROM.  Cardiovascular: tachycardic irregular rhythm. Grossly normal heart sounds.  Good peripheral circulation. Respiratory: tachypnea with expiratory wheeze throughout Gastrointestinal: Soft and nontender. No distention. No abdominal bruits. No CVA tenderness. Genitourinary:  Musculoskeletal:  No lower extremity tenderness nor edema.  No joint effusions. Neurologic:  following commands Skin:  Skin is warm, dry and intact. No rash noted. Psychiatric: unable to asess  ____________________________________________   LABS (all labs ordered are listed, but only abnormal results are displayed)  Results for orders placed or performed during the hospital encounter of 06/19/2021 (from the past 24 hour(s))  Lactic acid, plasma     Status: None   Collection Time: 06/10/2021 11:40 AM  Result Value Ref Range   Lactic Acid, Venous 1.0 0.5 - 1.9 mmol/L  Comprehensive metabolic panel     Status: Abnormal   Collection Time: 06/28/2021 11:40 AM  Result Value Ref Range   Sodium 139 135 - 145 mmol/L   Potassium 4.3 3.5 - 5.1 mmol/L   Chloride 94 (L) 98 - 111 mmol/L   CO2 38 (H) 22 - 32 mmol/L   Glucose,  Bld 108 (H) 70 - 99 mg/dL   BUN 18 8 - 23 mg/dL   Creatinine, Ser 0.99 0.44 - 1.00 mg/dL   Calcium 8.8 (L) 8.9 - 10.3 mg/dL   Total Protein 6.1 (L) 6.5 - 8.1 g/dL   Albumin 3.3 (L) 3.5 - 5.0 g/dL   AST 12 (L) 15 - 41 U/L   ALT 13 0 - 44 U/L   Alkaline Phosphatase 88 38 - 126 U/L   Total Bilirubin 1.1 0.3 - 1.2 mg/dL   GFR, Estimated >60 >60 mL/min   Anion gap 7 5 - 15  CBC WITH DIFFERENTIAL     Status: Abnormal   Collection Time: 07/02/2021 11:40 AM  Result Value Ref Range   WBC 18.2 (H) 4.0 - 10.5 K/uL   RBC 3.48 (L) 3.87 - 5.11 MIL/uL   Hemoglobin 9.3 (L) 12.0 - 15.0 g/dL   HCT 32.4 (L) 36.0 - 46.0 %   MCV 93.1 80.0 - 100.0 fL   MCH 26.7 26.0 - 34.0 pg   MCHC 28.7 (L) 30.0 - 36.0 g/dL   RDW 18.7 (H) 11.5 - 15.5 %   Platelets 322 150 - 400 K/uL   nRBC 0.0 0.0 - 0.2 %   Neutrophils Relative % 91 %   Neutro Abs 16.5 (H) 1.7 - 7.7 K/uL   Lymphocytes Relative 3 %   Lymphs Abs 0.6 (L) 0.7 - 4.0 K/uL   Monocytes Relative 4 %   Monocytes Absolute 0.8 0.1 - 1.0 K/uL   Eosinophils Relative 1 %   Eosinophils Absolute 0.1 0.0 - 0.5 K/uL   Basophils Relative 0 %   Basophils Absolute 0.1 0.0 - 0.1 K/uL   Immature Granulocytes 1 %   Abs Immature Granulocytes 0.10 (H) 0.00 - 0.07 K/uL  Protime-INR     Status: None   Collection Time: 06/08/2021 11:40 AM  Result Value Ref Range   Prothrombin Time 13.4 11.4 - 15.2 seconds   INR 1.0 0.8 - 1.2  APTT     Status: None   Collection Time: 06/03/2021 11:40 AM  Result Value Ref Range   aPTT 32 24 - 36 seconds  Brain natriuretic peptide     Status: Abnormal   Collection Time: 06/13/2021 11:40 AM  Result Value Ref Range   B Natriuretic Peptide 515.4 (H) 0.0 - 100.0 pg/mL  Blood gas, venous     Status: Abnormal   Collection Time: 06/30/2021 11:52 AM  Result Value Ref Range   pH, Ven 7.24 (L) 7.250 - 7.430   pCO2, Ven 88 (HH) 44.0 - 60.0 mmHg  pO2, Ven 32.0 32.0 - 45.0 mmHg   Bicarbonate 37.7 (H) 20.0 - 28.0 mmol/L   Acid-Base Excess 8.4 (H) 0.0 -  2.0 mmol/L   O2 Saturation 49.5 %   Patient temperature 37.0    Collection site VEIN    Sample type VEIN   Blood gas, venous     Status: Abnormal   Collection Time: 06/14/2021 12:59 PM  Result Value Ref Range   pH, Ven 7.34 7.250 - 7.430   pCO2, Ven 79 (HH) 44.0 - 60.0 mmHg   pO2, Ven 36.0 32.0 - 45.0 mmHg   Bicarbonate 42.6 (H) 20.0 - 28.0 mmol/L   Acid-Base Excess 14.5 (H) 0.0 - 2.0 mmol/L   O2 Saturation 64.9 %   Patient temperature 37.0    Collection site VEIN    Sample type VEIN    ____________________________________________  EKG My review and personal interpretation at Time: 11:43   Indication: ams  Rate: 110  Rhythm: afib Axis: normal Other: normal intervals, no stemi, ____________________________________________  RADIOLOGY  I personally reviewed all radiographic images ordered to evaluate for the above acute complaints and reviewed radiology reports and findings.  These findings were personally discussed with the patient.  Please see medical record for radiology report.  ____________________________________________   PROCEDURES  Procedure(s) performed:  .Critical Care Performed by: Merlyn Lot, MD Authorized by: Merlyn Lot, MD   Critical care provider statement:    Critical care time (minutes):  35   Critical care was necessary to treat or prevent imminent or life-threatening deterioration of the following conditions:  Respiratory failure   Critical care was time spent personally by me on the following activities:  Ordering and performing treatments and interventions, ordering and review of laboratory studies, ordering and review of radiographic studies, pulse oximetry, re-evaluation of patient's condition, review of old charts, ventilator management, examination of patient, evaluation of patient's response to treatment and obtaining history from patient or surrogate    Critical Care performed:  yes ____________________________________________   INITIAL IMPRESSION / ASSESSMENT AND PLAN / ED COURSE  Pertinent labs & imaging results that were available during my care of the patient were reviewed by me and considered in my medical decision making (see chart for details).   DDX: Dehydration, sepsis, pna, uti, hypoglycemia, cva, drug effect, withdrawal, encephalitis  Paula Torres is a 70 y.o. who presents to the ED with presentation as described above.  Patient is chronically ill-appearing with large wound and cellulitis to the left leg also tachypneic with prolonged expiratory phase wheezing on exam concern for hypercapnic respiratory failure.  No report of any trauma but history is somewhat limited.  Abdominal exam otherwise soft and benign.  Will place on BIPap, nebs, blood work and give abx given concern for sepsis.  Clinical Course as of 06/27/2021 1537  Mon Jun 12, 2021  1216 Patient is hypercapnic I think is likely source of her presentation she is on BiPAP she is following commands.  She is pulling tidal volumes in the high 400s low 500s therefore I think that of observation on BiPAP with repeat VBG to reassess what is appropriate at this time [PR]  7322 VBG is improving.  She is following commands.  She is still somewhat altered however will order CT head to make sure there is no neurotrauma she is not able to provide much additional history. [PR]  1536 Patient now much more alert.  Asking for mass to be removed.  She is following commands took her off the mask  she is put on 5 L nasal cannula will observe to see if she needs increasing O2 requirement but I suspect that her hypercapnia has now resolved.  Will consult hospitalist. [PR]    Clinical Course User Index [PR] Merlyn Lot, MD    The patient was evaluated in Emergency Department today for the symptoms described in the history of present illness. He/she was evaluated in the context of the global COVID-19 pandemic, which  necessitated consideration that the patient might be at risk for infection with the SARS-CoV-2 virus that causes COVID-19. Institutional protocols and algorithms that pertain to the evaluation of patients at risk for COVID-19 are in a state of rapid change based on information released by regulatory bodies including the CDC and federal and state organizations. These policies and algorithms were followed during the patient's care in the ED.  As part of my medical decision making, I reviewed the following data within the [facility] notes reviewed and incorporated, Labs reviewed, notes from prior ED visits and [place] Controlled Substance Database   ____________________________________________   FINAL CLINICAL IMPRESSION(S) / ED DIAGNOSES  Final diagnoses:  Acute respiratory failure with hypoxia and hypercapnia (HCC)      NEW MEDICATIONS STARTED DURING THIS VISIT:  New Prescriptions   No medications on file     Note:  This document was prepared using Dragon voice recognition software and may include unintentional dictation errors.    Merlyn Lot, MD 06/19/2021 1537

## 2021-06-12 NOTE — ED Notes (Signed)
Pt took bipap off again.  MD notified and sedation requested.

## 2021-06-12 NOTE — ED Notes (Addendum)
This RN redressed pt wound on left leg.  Wound was seeping a small amount of blood.

## 2021-06-12 NOTE — Consult Note (Addendum)
Pharmacy Antibiotic Note  Paula Torres is a 70 y.o. female with PMH of HFpEF, COPD, CAD, HTN, OSA, pulmonary HTN, paroxysmal Afib who was admitted on 07/03/2021 with  HCAP .  Pharmacy has been consulted for vancomycin and cefepime dosing.  Afeb since admission, WBC 18.2  Bcx and MRSA PCR sent  Plan: Cefepime -Will start cefepime 2g IV q8h  Vancomycin -Pt received 2539m LD in ED -Will start vancomycin 17568mIV q24h    -Est AUC 524.2    -Css min: 12.8    -Scr used: 0.99    -Wt used IBW    -Vd 0.5  Will continue to monitor renal function and adjust dose as clinically indicated   Height: _0  (170.2 cm) Weight: (!) 140 kg (308 lb 10.3 oz) IBW/kg (Calculated) : 61.6  Temp (24hrs), Avg:97.4 F (36.3 C), Min:97.4 F (36.3 C), Max:97.4 F (36.3 C)  Recent Labs  Lab 06/06/21 0635 06/07/21 0508 06/08/21 0436 06/03/2021 1140  WBC 15.9* 18.4*  --  18.2*  CREATININE 1.27* 1.13* 1.05* 0.99  LATICACIDVEN  --   --   --  1.0    Estimated Creatinine Clearance: 78.7 mL/min (by C-G formula based on SCr of 0.99 mg/dL).    Allergies  Allergen Reactions   Codeine     Back Headache   Sulfa Antibiotics     Reported to pt from mother who is no longer living -  Details unknown    Antimicrobials this admission: Vancomycin 10/10 >>  Cefepime 10/10 >>   Microbiology results: 10/10 BCx: sent  10/10 MRSA PCR: sent  Thank you for allowing pharmacy to be a part of this patient's care.  BrNarda RutherfordPharmD Pharmacy Resident  06/29/2021 5:14 PM

## 2021-06-12 NOTE — ED Triage Notes (Signed)
Pt arrived via [facility] from [facility]. Per EMS, pt responsive only to painful stimuli.  No last known well per EMS.  Hx of Afib, pt has not had morning medications.  Chronic COPD, facility was not able to tell if pt was on Bipap last night.

## 2021-06-12 NOTE — ED Notes (Signed)
Pt alert, eating from dinner tray at this time.

## 2021-06-12 NOTE — Consult Note (Signed)
PHARMACY -  BRIEF ANTIBIOTIC NOTE   Pharmacy has received consult(s) for vancomycin and cefepime from an ED provider.  The patient's profile has been reviewed for ht/wt/allergies/indication/available labs.    One time order(s) placed for cefepime 2g and vancomycin 2.5g (1g + 1.5g)  Further antibiotics/pharmacy consults should be ordered by admitting physician if indicated.                       Thank you, Narda Rutherford, PharmD Pharmacy Resident  06/03/2021 1:34 PM

## 2021-06-12 NOTE — Consult Note (Addendum)
ANTICOAGULATION CONSULT NOTE - Follow Up Consult  Pharmacy Consult for apixaban  Indication: atrial fibrillation  Allergies  Allergen Reactions   Codeine     Back Headache   Sulfa Antibiotics     Reported to pt from mother who is no longer living -  Details unknown    Patient Measurements: Height: _0  (170.2 cm) Weight: (!) 140 kg (308 lb 10.3 oz) IBW/kg (Calculated) : 61.6  Vital Signs: Temp: 97.4 F (36.3 C) (10/10 1148) Temp Source: Axillary (10/10 1148) BP: 106/88 (10/10 1559) Pulse Rate: 82 (10/10 1559)  Labs: Recent Labs    07/03/2021 1140  HGB 9.3*  HCT 32.4*  PLT 322  APTT 32  LABPROT 13.4  INR 1.0  CREATININE 0.99    Estimated Creatinine Clearance: 78.7 mL/min (by C-G formula based on SCr of 0.99 mg/dL).   Medications:  Scheduled:   apixaban  5 mg Oral BID   ipratropium  0.5 mg Nebulization Q4H   [START ON 06/13/2021] methylPREDNISolone (SOLU-MEDROL) injection  60 mg Intravenous Q12H    Assessment: 70 y.o. female with PMH of HFpEF, COPD, CAD, HTN, OSA, pulmonary HTN, paroxysmal Afib who was admitted on 06/20/2021 for altered mental status. Apixaban was initially held for left leg wound bleeding. Pharmacy was consulted for restarting home apixaban for afib.   CBC within normal limits   Plan:  -Will start apixaban 53m BID -Monitor CBC every 3 days while admitted on apixaban   BNarda Rutherford PharmD Pharmacy Resident  06/10/2021 5:20 PM

## 2021-06-12 NOTE — H&P (Signed)
History and Physical    Paula Torres VHQ:[id number] DOB: June 25, 1951 DOA: 06/24/2021  Referring MD/NP/PA:   PCP: Rusty Aus, MD   Patient coming from:  The patient is coming from SNF       Chief Complaint: AMS, SOB  HPI: Paula Torres is a 70 y.o. female with medical history significant of dCHF, hyperlipidemia, COPD on 3-4 L oxygen, GERD, depression, PAF (was on Eliquis which is on hold due to recent leg wound bleeding), pulmonary hypertension, OSA on BiPAP, CAD, iron deficiency anemia, CKD-3A, who presents with AMS and SOB.  Per her daughter at the bedside, patient normally needs to wear BiPAP for OSA, but patient is fully compliant with BiPAP.  When she does not use BiPAP, she often becomes confused due to elevated CO2 level.  Today patient was found to be confused again.  She has some shortness of breath, no worsening cough, no fever or chills.  Patient has some left-sided chest pain.  No active nausea ,vomiting, diarrhea noted. When I saw pt in ED, she is confused, knows her own name, not oriented to place and time. Of note, patient was recently hospitalized from 9/25 to 10/6 due to left lower leg wound infection with surrounding cellulitis.  Patient was discharged on cefadroxil.   Patient initially had oxygen desaturated to 88% on home level 3 L oxygen, BiPAP was started in ED.  First ABG with pH 7.24, CO2 88, O2 32.  The repeated VBG after started BiPAP showed pH 7.34, CO2 79, O2 36.   ED Course: pt was found to have WBC 18.2, BNP 515.4, negative COVID PCR, lactic acid 1.0, INR 1.0, electrolytes renal function okay, temperature 97.4, softer blood pressure 99/69, heart rate 93, RR 23.    The repeated chest x-ray showed right lower lobe infiltration.  CT of head is negative for acute intracranial abnormalities.  Patient is admitted to progressive bed as inpatient  Review of Systems: Could not be reviewed due to altered mental status.  Allergy:  Allergies  Allergen Reactions    Codeine     Back Headache   Sulfa Antibiotics     Reported to pt from mother who is no longer living -  Details unknown    Past Medical History:  Diagnosis Date   (HFpEF) heart failure with preserved ejection fraction (HCC)    Chronic respiratory failure with hypoxia (HCC)    COPD (chronic obstructive pulmonary disease) (HCC)    Coronary artery disease, non-occlusive    Hypertension    OSA (obstructive sleep apnea)    PAF (paroxysmal atrial fibrillation) ([place])    Pulmonary hypertension (HCC)     Past Surgical History:  Procedure Laterality Date   NO PAST SURGERIES     RIGHT/LEFT HEART CATH AND CORONARY ANGIOGRAPHY Left 12/21/2019   Procedure: RIGHT/LEFT HEART CATH AND CORONARY ANGIOGRAPHY;  Surgeon: Wellington Hampshire, MD;  Location: [facility];  Service: Cardiovascular;  Laterality: Left;    Social History:  reports that she quit smoking about 4 years ago. Her smoking use included cigarettes. She has never used smokeless tobacco. She reports that she does not currently use alcohol. She reports that she does not use drugs.  Family History:  Family History  Problem Relation Age of Onset   Hypertension Other      Prior to Admission medications   Medication Sig Start Date End Date Taking? Authorizing Provider  acetaminophen (TYLENOL) 325 MG tablet Take 2 tablets (650 mg total) by mouth every 6 (  six) hours as needed for mild pain, fever, moderate pain or headache. 06/08/21   Cherene Altes, MD  atorvastatin (LIPITOR) 20 MG tablet Take 1 tablet (20 mg total) by mouth daily at 6 PM. Patient taking differently: Take 20 mg by mouth at bedtime. 11/23/19   Loel Dubonnet, NP  bisoprolol (ZEBETA) 10 MG tablet Take 1 tablet (10 mg total) by mouth 2 (two) times daily. 06/08/21   Cherene Altes, MD  cefadroxil (DURICEF) 500 MG capsule Take 2 capsules (1,000 mg total) by mouth 2 (two) times daily for 7 days. 06/08/21 06/15/21  Cherene Altes, MD  collagenase (SANTYL)  ointment Apply topically daily. 06/09/21   Cherene Altes, MD  dextromethorphan-guaiFENesin (MUCINEX DM) 30-600 MG 12hr tablet Take 1 tablet by mouth 2 (two) times daily as needed for cough. 06/08/21   Cherene Altes, MD  diltiazem (CARDIZEM) 60 MG tablet Take 1 tablet (60 mg total) by mouth every 8 (eight) hours. 06/08/21   Cherene Altes, MD  docusate sodium (COLACE) 100 MG capsule Take 1 tablet once or twice daily as needed for constipation while taking narcotic pain medicine 05/25/21   Hinda Kehr, MD  famotidine (PEPCID) 20 MG tablet Take 20 mg by mouth at bedtime.    [provider]  ferrous gluconate (FERGON) 324 MG tablet Take 324 mg by mouth once a week.    [provider]  fluticasone (FLONASE) 50 MCG/ACT nasal spray Place 2 sprays into both nostrils daily as needed for allergies.    [provider]  Fluticasone-Umeclidin-Vilant 100-62.5-25 MCG/INH AEPB Inhale 1 puff into the lungs daily.    [provider]  folic acid (FOLVITE) 1 MG tablet Take 1 tablet (1 mg total) by mouth daily. 06/09/21   Cherene Altes, MD  levalbuterol Penne Lash) 0.63 MG/3ML nebulizer solution Take 3 mLs (0.63 mg total) by nebulization every 4 (four) hours as needed for wheezing or shortness of breath. 06/08/21   Cherene Altes, MD  levalbuterol (XOPENEX) 1.25 MG/0.5ML nebulizer solution Take 1.25 mg by nebulization 3 (three) times daily. 06/08/21   Cherene Altes, MD  loratadine (CLARITIN) 10 MG tablet Take 1 tablet (10 mg total) by mouth daily. Patient taking differently: Take 10 mg by mouth at bedtime. 05/13/20   Nicole Kindred A, DO  montelukast (SINGULAIR) 10 MG tablet Take 10 mg by mouth at bedtime.    [provider]  Treprostinil Diolamine ER 0.25 MG TBCR Take 0.25 mg by mouth in the morning and at bedtime.    [provider]  Vitamin D, Ergocalciferol, (DRISDOL) 1.25 MG (50000 UNIT) CAPS capsule Take 50,000 Units by mouth every Saturday.     [provider]    Physical Exam: Vitals:   07/01/2021 1503 06/14/2021 1559 06/10/2021 1600 06/13/2021 1753  BP: 99/69 106/88  (!) 137/117  Pulse: 75 82  (!) 103  Resp: 15 (!) 24  20  Temp:      TempSrc:      SpO2: 100% 100% 97% 100%  Weight:      Height:       General: Not in acute distress HEENT:       Eyes: PERRL, EOMI, no scleral icterus.       ENT: No discharge from the ears and nose, no pharynx injection, no tonsillar enlargement.        Neck: No JVD, no bruit, no mass felt. Heme: No neck lymph node enlargement. Cardiac: S1/S2, RRR, No murmurs, No  gallops or rubs. Respiratory: Has wheezing bilaterally GI: Soft, nondistended, nontender, no organomegaly, BS present. GU: No hematuria Ext: Has 1+ pitting leg edema bilaterally. 1+DP/PT pulse bilaterally. Has scabbed wound in left anterior leg.    Musculoskeletal: No joint deformities, No joint redness or warmth, no limitation of ROM in spin. Skin: No rashes.  Neuro: Confused, knows her own name, not orientated to the place and time.  Cranial nerves II-XII grossly intact, moves all extremities normally. Psych: Patient is not psychotic  Labs on Admission: I have personally reviewed following labs and imaging studies  CBC: Recent Labs  Lab 06/06/21 0635 06/07/21 0508 06/27/2021 1140  WBC 15.9* 18.4* 18.2*  NEUTROABS  --   --  16.5*  HGB 7.7* 8.4* 9.3*  HCT 25.9* 29.1* 32.4*  MCV 89.3 92.1 93.1  PLT 254 276 536   Basic Metabolic Panel: Recent Labs  Lab 06/06/21 0635 06/07/21 0508 06/08/21 0436 06/17/2021 1140  NA 137 141 140 139  K 3.7 3.9 3.7 4.3  CL 94* 97* 97* 94*  CO2 37* 35* 38* 38*  GLUCOSE 117* 86 95 108*  BUN 32* 28* 26* 18  CREATININE 1.27* 1.13* 1.05* 0.99  CALCIUM 8.1* 8.4* 8.3* 8.8*  MG 2.2 2.4  --   --    GFR: Estimated Creatinine Clearance: 78.7 mL/min (by C-G formula based on SCr of 0.99 mg/dL). Liver Function Tests: Recent Labs  Lab 06/08/21 0436 06/29/2021 1140  AST 10* 12*  ALT 15  13  ALKPHOS 73 88  BILITOT 1.1 1.1  PROT 5.7* 6.1*  ALBUMIN 3.0* 3.3*   No results for input(s): LIPASE, AMYLASE in the last 168 hours. Recent Labs  Lab 06/08/21 0436  AMMONIA <10   Coagulation Profile: Recent Labs  Lab 07/01/2021 1140  INR 1.0   Cardiac Enzymes: No results for input(s): CKTOTAL, CKMB, CKMBINDEX, TROPONINI in the last 168 hours. BNP (last 3 results) No results for input(s): PROBNP in the last 8760 hours. HbA1C: No results for input(s): HGBA1C in the last 72 hours. CBG: No results for input(s): GLUCAP in the last 168 hours. Lipid Profile: No results for input(s): CHOL, HDL, LDLCALC, TRIG, CHOLHDL, LDLDIRECT in the last 72 hours. Thyroid Function Tests: No results for input(s): TSH, T4TOTAL, FREET4, T3FREE, THYROIDAB in the last 72 hours. Anemia Panel: No results for input(s): VITAMINB12, FOLATE, FERRITIN, TIBC, IRON, RETICCTPCT in the last 72 hours. Urine analysis:    Component Value Date/Time   COLORURINE YELLOW (A) 04/25/2021 0523   APPEARANCEUR HAZY (A) 04/25/2021 0523   LABSPEC 1.017 04/25/2021 0523   PHURINE 5.0 04/25/2021 0523   GLUCOSEU NEGATIVE 04/25/2021 0523   HGBUR MODERATE (A) 04/25/2021 0523   BILIRUBINUR NEGATIVE 04/25/2021 0523   KETONESUR NEGATIVE 04/25/2021 0523   PROTEINUR NEGATIVE 04/25/2021 0523   NITRITE POSITIVE (A) 04/25/2021 0523   LEUKOCYTESUR TRACE (A) 04/25/2021 0523   Sepsis Labs: _0 (procalcitonin:4,lacticidven:4) ) Recent Results (from the past 240 hour(s))  Resp Panel by RT-PCR (Flu A&B, Covid) Nasopharyngeal Swab     Status: None   Collection Time: 06/08/21  1:15 PM   Specimen: Nasopharyngeal Swab; Nasopharyngeal(NP) swabs in vial transport medium  Result Value Ref Range Status   SARS Coronavirus 2 by RT PCR NEGATIVE NEGATIVE Final    Comment: (NOTE) SARS-CoV-2 target nucleic acids are NOT DETECTED.  The SARS-CoV-2 RNA is generally detectable in upper respiratory specimens during the acute phase of  infection. The lowest concentration of SARS-CoV-2 viral copies this assay can detect is 138 copies/mL. A  negative result does not preclude SARS-Cov-2 infection and should not be used as the sole basis for treatment or other patient management decisions. A negative result may occur with  improper specimen collection/handling, submission of specimen other than nasopharyngeal swab, presence of viral mutation(s) within the areas targeted by this assay, and inadequate number of viral copies(<138 copies/mL). A negative result must be combined with clinical observations, patient history, and epidemiological information. The expected result is Negative.  Fact Sheet for Patients:  EntrepreneurPulse.com.au  Fact Sheet for Healthcare Providers:  IncredibleEmployment.be  This test is no t yet approved or cleared by the [place] FDA and  has been authorized for detection and/or diagnosis of SARS-CoV-2 by FDA under an Emergency Use Authorization (EUA). This EUA will remain  in effect (meaning this test can be used) for the duration of the COVID-19 declaration under Section 564(b)(1) of the Act, 21 U.S.C.section 360bbb-3(b)(1), unless the authorization is terminated  or revoked sooner.       Influenza A by PCR NEGATIVE NEGATIVE Final   Influenza B by PCR NEGATIVE NEGATIVE Final    Comment: (NOTE) The Xpert Xpress SARS-CoV-2/FLU/RSV plus assay is intended as an aid in the diagnosis of influenza from Nasopharyngeal swab specimens and should not be used as a sole basis for treatment. Nasal washings and aspirates are unacceptable for Xpert Xpress SARS-CoV-2/FLU/RSV testing.  Fact Sheet for Patients: EntrepreneurPulse.com.au  Fact Sheet for Healthcare Providers: IncredibleEmployment.be  This test is not yet approved or cleared by the [place] FDA and has been authorized for detection and/or diagnosis of SARS-CoV-2  by FDA under an Emergency Use Authorization (EUA). This EUA will remain in effect (meaning this test can be used) for the duration of the COVID-19 declaration under Section 564(b)(1) of the Act, 21 U.S.C. section 360bbb-3(b)(1), unless the authorization is terminated or revoked.  Performed at [facility], [address] [place], [place] [address]   Resp Panel by RT-PCR (Flu A&B, Covid) Nasopharyngeal Swab     Status: None   Collection Time: 06/15/2021  3:08 PM   Specimen: Nasopharyngeal Swab; Nasopharyngeal(NP) swabs in vial transport medium  Result Value Ref Range Status   SARS Coronavirus 2 by RT PCR NEGATIVE NEGATIVE Final    Comment: (NOTE) SARS-CoV-2 target nucleic acids are NOT DETECTED.  The SARS-CoV-2 RNA is generally detectable in upper respiratory specimens during the acute phase of infection. The lowest concentration of SARS-CoV-2 viral copies this assay can detect is 138 copies/mL. A negative result does not preclude SARS-Cov-2 infection and should not be used as the sole basis for treatment or other patient management decisions. A negative result may occur with  improper specimen collection/handling, submission of specimen other than nasopharyngeal swab, presence of viral mutation(s) within the areas targeted by this assay, and inadequate number of viral copies(<138 copies/mL). A negative result must be combined with clinical observations, patient history, and epidemiological information. The expected result is Negative.  Fact Sheet for Patients:  EntrepreneurPulse.com.au  Fact Sheet for Healthcare Providers:  IncredibleEmployment.be  This test is no t yet approved or cleared by the [place] FDA and  has been authorized for detection and/or diagnosis of SARS-CoV-2 by FDA under an Emergency Use Authorization (EUA). This EUA will remain  in effect (meaning this test can be used) for the duration of the COVID-19  declaration under Section 564(b)(1) of the Act, 21 U.S.C.section 360bbb-3(b)(1), unless the authorization is terminated  or revoked sooner.       Influenza A by PCR  NEGATIVE NEGATIVE Final   Influenza B by PCR NEGATIVE NEGATIVE Final    Comment: (NOTE) The Xpert Xpress SARS-CoV-2/FLU/RSV plus assay is intended as an aid in the diagnosis of influenza from Nasopharyngeal swab specimens and should not be used as a sole basis for treatment. Nasal washings and aspirates are unacceptable for Xpert Xpress SARS-CoV-2/FLU/RSV testing.  Fact Sheet for Patients: EntrepreneurPulse.com.au  Fact Sheet for Healthcare Providers: IncredibleEmployment.be  This test is not yet approved or cleared by the [place] FDA and has been authorized for detection and/or diagnosis of SARS-CoV-2 by FDA under an Emergency Use Authorization (EUA). This EUA will remain in effect (meaning this test can be used) for the duration of the COVID-19 declaration under Section 564(b)(1) of the Act, 21 U.S.C. section 360bbb-3(b)(1), unless the authorization is terminated or revoked.  Performed at [facility], [address] [place], [place] [address]      Radiological Exams on Admission: CT HEAD WO CONTRAST (5MM)  Result Date: 07/01/2021 CLINICAL DATA:  Mental status change, unknown cause. EXAM: CT HEAD WITHOUT CONTRAST TECHNIQUE: Contiguous axial images were obtained from the base of the skull through the vertex without intravenous contrast. COMPARISON:  Prior head CT examinations 05/28/2021 and earlier. FINDINGS: Brain: Mild generalized cerebral atrophy. Mild to moderate patchy and ill-defined hypoattenuation within the cerebral white matter, nonspecific but compatible with chronic small vessel ischemic disease. There is no acute intracranial hemorrhage. No demarcated cortical infarct. No extra-axial fluid collection. No evidence of an intracranial mass. No midline  shift. Partially empty sella turcica. Vascular: No hyperdense vessel. Atherosclerotic calcifications. Skull: Normal. Negative for fracture or focal lesion. Sinuses/Orbits: Visualized orbits show no acute finding. Trace bilateral ethmoid sinus mucosal thickening. Small mucous retention cyst within a right ethmoid air cell. IMPRESSION: No evidence of acute intracranial abnormality. Mild-to-moderate chronic small vessel ischemic changes within the cerebral white matter. Mild generalized cerebral atrophy. Mild ethmoid sinus disease, as described. Electronically Signed   By: Kellie Simmering D.O.   On: 06/20/2021 16:01   DG Chest Port 1 View  Result Date: 06/30/2021 CLINICAL DATA:  Questionable sepsis - evaluate for abnormality new consolidation in the right lower EXAM: PORTABLE CHEST 1 VIEW COMPARISON:  05/28/2021. FINDINGS: New consolidation in the right lower lobe. Chronic interstitial prominence, slightly increased in conspicuity on the right. No visible pleural effusions or pneumothorax. Limited evaluation left lung apex to overlapping head/neck. Similar enlargement the cardiac silhouette. Calcific atherosclerosis aorta. Chronic right rib fractures. Chronic left proximal humerus fracture. IMPRESSION: 1. New consolidation in the right lower lobe, concerning for pneumonia. 2. Chronic interstitial prominence, slightly increased in conspicuity on the right. This could represent chronic change, but infection or mild asymmetric edema are considerations. Electronically Signed   By: Margaretha Sheffield M.D.   On: 06/03/2021 12:40     EKG: I have personally reviewed.  Atrial fibrillation, QTC 411, RAD, early R wave progression  Assessment/Plan Principal Problem:   HCAP (healthcare-associated pneumonia) Active Problems:   COPD with acute exacerbation (HCC)   Obesity, Class III, BMI 40-49.9 (morbid obesity) ([place])   Pulmonary hypertension (HCC)   Depression   Acute on chronic respiratory failure with hypoxia and  hypercapnia (HCC)   Wound infection_left lower leg   Chronic diastolic CHF (congestive heart failure) (HCC)   Iron deficiency anemia   PAF (paroxysmal atrial fibrillation) (HCC)   OSA (obstructive sleep apnea)   Sepsis (HCC)   CAD (coronary artery disease)   Sepsis and acute on chronic respiratory failure with hypoxia  and hypercapnia due to HCAP healthcare-associated pneumonia) and COPD with acute exacerbation [facility]): Patient meets criteria for sepsis with WBC 18.2, tachycardia with heart rate 93, tachypnea with RR 23.  Lactic acid is normal 1.0. Blood pressure soft, hemodynamically stable.  -Admitted to progressive unit as inpatient - IV Vancomycin and cefepime  - BiPAP - solumedrol 60 mg bid - Mucinex for cough  - Bronchodilators - Urine legionella and S. pneumococcal antigen - Follow up blood culture x2, sputum culture - will get Procalcitonin  - IVF: will not give IVf due normal lactic acid and hx of CHF  Obesity, Class III, BMI 40-49.9 (morbid obesity) ([place]) -Diet and exercise.   -Encouraged to lose weight.   Pulmonary hypertension (HCC) -Trprostinil  Wound infection_left lower leg -consult wound care  Chronic diastolic CHF (congestive heart failure) ([place]): 2D echo on 01/31/2020 showed EF of 30 to 35%.  BNP 515, no pulm edema on chest x-ray, patient does not seem to have CHF exacerbation, but obviously at high risk for developing CHF exacerbation -Will not start diuretics due to sepsis -watch volume status closely  Iron deficiency anemia -Continue iron supplement  PAF (paroxysmal atrial fibrillation) ([place]): Patient was on Eliquis before, which is on hold due to recent left leg wound bleeding -Restart Eliquis -Hold Zebeta and Cardizem due to softer blood pressure  OSA (obstructive sleep apnea) -BiPAP  CAD (coronary artery disease) -Lipitor    DVT ppx: on Eliquis Code Status: Full code Family Communication:   Yes, patient's daguter   at bed side Disposition  Plan:  Anticipate discharge back to previous environment Consults called:  none Admission status and Level of care: Progressive Cardiac:  as inpt     Status is: Inpatient  Remains inpatient appropriate because:Inpatient level of care appropriate due to severity of illness  Dispo: The patient is from: SNF              Anticipated d/c is to: SNF              Patient currently is not medically stable to d/c.   Difficult to place patient No         Date of Service 06/20/2021    [facility]   If 7PM-7AM, please contact night-coverage [email or web address] 06/29/2021, 5:57 PM

## 2021-06-12 NOTE — ED Notes (Signed)
MD took pt off Bipap, trialing at 4L [place]. Sat 98.

## 2021-06-12 NOTE — ED Notes (Signed)
Pt stating 'Help Me' over and over.  Pt reassured that we are helping her.

## 2021-06-12 NOTE — ED Notes (Signed)
Pt sats decreased. This RN wallked in and pt had taken off Bipap mask and finger sat. I put them both back on and sats improved to 100. Respiratory checked for placement as well.

## 2021-06-13 ENCOUNTER — Other Ambulatory Visit: Payer: Self-pay

## 2021-06-13 ENCOUNTER — Encounter: Payer: Self-pay | Admitting: Internal Medicine

## 2021-06-13 DIAGNOSIS — J9601 Acute respiratory failure with hypoxia: Secondary | ICD-10-CM | POA: Diagnosis not present

## 2021-06-13 DIAGNOSIS — Z7189 Other specified counseling: Secondary | ICD-10-CM | POA: Diagnosis not present

## 2021-06-13 DIAGNOSIS — J189 Pneumonia, unspecified organism: Secondary | ICD-10-CM | POA: Diagnosis not present

## 2021-06-13 DIAGNOSIS — J9602 Acute respiratory failure with hypercapnia: Secondary | ICD-10-CM

## 2021-06-13 DIAGNOSIS — Z515 Encounter for palliative care: Secondary | ICD-10-CM

## 2021-06-13 LAB — CBC
HCT: 30.5 % — ABNORMAL LOW (ref 36.0–46.0)
Hemoglobin: 9.3 g/dL — ABNORMAL LOW (ref 12.0–15.0)
MCH: 27.5 pg (ref 26.0–34.0)
MCHC: 30.5 g/dL (ref 30.0–36.0)
MCV: 90.2 fL (ref 80.0–100.0)
Platelets: 297 10*3/uL (ref 150–400)
RBC: 3.38 MIL/uL — ABNORMAL LOW (ref 3.87–5.11)
RDW: 18.6 % — ABNORMAL HIGH (ref 11.5–15.5)
WBC: 12.9 10*3/uL — ABNORMAL HIGH (ref 4.0–10.5)
nRBC: 0 % (ref 0.0–0.2)

## 2021-06-13 LAB — BLOOD GAS, ARTERIAL
Acid-Base Excess: 15.9 mmol/L — ABNORMAL HIGH (ref 0.0–2.0)
Bicarbonate: 43.1 mmol/L — ABNORMAL HIGH (ref 20.0–28.0)
FIO2: 28
O2 Saturation: 97.8 %
Patient temperature: 37
pCO2 arterial: 68 mmHg (ref 32.0–48.0)
pH, Arterial: 7.41 (ref 7.350–7.450)
pO2, Arterial: 100 mmHg (ref 83.0–108.0)

## 2021-06-13 LAB — BASIC METABOLIC PANEL
Anion gap: 8 (ref 5–15)
BUN: 23 mg/dL (ref 8–23)
CO2: 33 mmol/L — ABNORMAL HIGH (ref 22–32)
Calcium: 8.9 mg/dL (ref 8.9–10.3)
Chloride: 95 mmol/L — ABNORMAL LOW (ref 98–111)
Creatinine, Ser: 1.02 mg/dL — ABNORMAL HIGH (ref 0.44–1.00)
GFR, Estimated: 60 mL/min — ABNORMAL LOW (ref >60)
Glucose, Bld: 161 mg/dL — ABNORMAL HIGH (ref 70–99)
Potassium: 4.7 mmol/L (ref 3.5–5.1)
Sodium: 136 mmol/L (ref 135–145)

## 2021-06-13 LAB — BLOOD GAS, VENOUS
Acid-Base Excess: 18.7 mmol/L — ABNORMAL HIGH (ref 0.0–2.0)
Bicarbonate: 45.6 mmol/L — ABNORMAL HIGH (ref 20.0–28.0)
O2 Saturation: 53.5 %
Patient temperature: 37
pCO2, Ven: 72 mmHg (ref 44.0–60.0)
pH, Ven: 7.41 (ref 7.250–7.430)

## 2021-06-13 LAB — LACTIC ACID, PLASMA: Lactic Acid, Venous: 1.3 mmol/L (ref 0.5–1.9)

## 2021-06-13 LAB — GLUCOSE, CAPILLARY: Glucose-Capillary: 182 mg/dL — ABNORMAL HIGH (ref 70–99)

## 2021-06-13 MED ORDER — TREPROSTINIL DIOLAMINE ER 0.25 MG PO TBCR: 0.2500 mg | EXTENDED_RELEASE_TABLET | Freq: Two times a day (BID) | ORAL | Status: DC

## 2021-06-13 MED ORDER — DOCUSATE SODIUM 100 MG PO CAPS
100.0000 mg | ORAL_CAPSULE | Freq: Every day | ORAL | Status: DC | PRN
  Administered 2021-06-14: 100 mg via ORAL
  Filled 2021-06-13: qty 1

## 2021-06-13 MED ORDER — FOLIC ACID 1 MG PO TABS
1.0000 mg | ORAL_TABLET | Freq: Every day | ORAL | Status: DC
  Administered 2021-06-14 – 2021-06-20 (×7): 1 mg via ORAL
  Filled 2021-06-13 (×7): qty 1

## 2021-06-13 MED ORDER — IPRATROPIUM BROMIDE 0.02 % IN SOLN
0.5000 mg | Freq: Three times a day (TID) | RESPIRATORY_TRACT | Status: DC
  Administered 2021-06-13 – 2021-06-18 (×16): 0.5 mg via RESPIRATORY_TRACT
  Filled 2021-06-13 (×18): qty 2.5

## 2021-06-13 MED ORDER — FERROUS GLUCONATE 324 (38 FE) MG PO TABS
324.0000 mg | ORAL_TABLET | ORAL | Status: DC
  Administered 2021-06-20: 324 mg via ORAL
  Filled 2021-06-13 (×4): qty 1

## 2021-06-13 MED ORDER — ALPRAZOLAM 0.25 MG PO TABS
0.2500 mg | ORAL_TABLET | Freq: Three times a day (TID) | ORAL | Status: DC | PRN
  Administered 2021-06-13 – 2021-06-15 (×5): 0.25 mg via ORAL
  Filled 2021-06-13 (×5): qty 1

## 2021-06-13 MED ORDER — LORATADINE 10 MG PO TABS
10.0000 mg | ORAL_TABLET | Freq: Every day | ORAL | Status: DC
  Administered 2021-06-14 – 2021-06-20 (×7): 10 mg via ORAL
  Filled 2021-06-13 (×7): qty 1

## 2021-06-13 MED ORDER — ATORVASTATIN CALCIUM 20 MG PO TABS
20.0000 mg | ORAL_TABLET | Freq: Every day | ORAL | Status: DC
  Administered 2021-06-13 – 2021-06-19 (×7): 20 mg via ORAL
  Filled 2021-06-13 (×8): qty 1

## 2021-06-13 MED ORDER — FAMOTIDINE 20 MG PO TABS
20.0000 mg | ORAL_TABLET | Freq: Every day | ORAL | Status: DC
  Administered 2021-06-13 – 2021-06-19 (×7): 20 mg via ORAL
  Filled 2021-06-13 (×7): qty 1

## 2021-06-13 MED ORDER — COLLAGENASE 250 UNIT/GM EX OINT
TOPICAL_OINTMENT | Freq: Every day | CUTANEOUS | Status: DC
  Filled 2021-06-13 (×3): qty 30

## 2021-06-13 MED ORDER — MONTELUKAST SODIUM 10 MG PO TABS
10.0000 mg | ORAL_TABLET | Freq: Every day | ORAL | Status: DC
  Administered 2021-06-13 – 2021-06-19 (×7): 10 mg via ORAL
  Filled 2021-06-13 (×8): qty 1

## 2021-06-13 NOTE — Consult Note (Signed)
Pharmacy Antibiotic Note  Paula Torres is a 70 y.o. female with PMH of HFpEF, COPD, CAD, HTN, OSA, pulmonary HTN, paroxysmal Afib who was admitted on 06/22/2021 with  HCAP .  Pharmacy has been consulted for vancomycin and cefepime dosing.  Afeb since admission, WBC 18.2>12.9  Bcx NG <24h and MRSA PCR and sputum culture sent  Plan: Cefepime -Will continue cefepime 2g IV q8h  Vancomycin -Will continue vancomycin 1756m IV q24h    -Est AUC 538.6    -Css min: 13.4    -Scr used: 1.02    -Wt used IBW    -Vd 0.5  Will continue to monitor renal function and adjust dose as clinically indicated   Height: _0  (170.2 cm) Weight: (!) 140 kg (308 lb 10.3 oz) IBW/kg (Calculated) : 61.6  Temp (24hrs), Avg:97.4 F (36.3 C), Min:97.4 F (36.3 C), Max:97.4 F (36.3 C)  Recent Labs  Lab 06/07/21 0508 06/08/21 0436 06/06/2021 1140 06/13/21 0650  WBC 18.4*  --  18.2* 12.9*  CREATININE 1.13* 1.05* 0.99 1.02*  LATICACIDVEN  --   --  1.0 1.3     Estimated Creatinine Clearance: 76.4 mL/min (A) (by C-G formula based on SCr of 1.02 mg/dL (H)).    Allergies  Allergen Reactions   Codeine     Back Headache   Sulfa Antibiotics     Reported to pt from mother who is no longer living -  Details unknown    Antimicrobials this admission: Vancomycin 10/10 >>  Cefepime 10/10 >>   Microbiology results: 10/10 BCx: NG <24h 10/10 MRSA PCR: sent 10/11 Sputum: sent  Thank you for allowing pharmacy to be a part of this patient's care.  BNarda Rutherford PharmD Pharmacy Resident  06/13/2021 10:52 AM

## 2021-06-13 NOTE — Progress Notes (Signed)
Orders for Bipap QHS. Upon arrival to patient's room, patient was awake and asking for things. She seems to be somewhat confused. I told her that I was there to place her on the Bipap machine for the night. The patient strongly refused. I explained that we highly recommend that she wear the Bipap unit to keep her CO2 levels from rising. Patient voiced that she understood, but she just could not do it tonight. She stated that she may attempt tomorrow. Will continue to monitor.

## 2021-06-13 NOTE — Consult Note (Signed)
Consultation Note Date: 06/13/2021   Patient Name: Paula Torres  DOB: [birth year]  MRN: [id number]  Age / Sex: 70 y.o., female  PCP: Rusty Aus, MD Referring Physician: Sidney Ace, MD  Reason for Consultation: Establishing goals of care  HPI/Patient Profile: 70 y.o. female  with past medical history of dHF, HLD, COPD on 3 to 4 L, GERD, PAF Eliquis on hold due to recent left leg wound/bleeding, pulmonary HTN, OSA on BiPAP, CAD, iron deficiency anemia, CKD 3, obesity, 2 hospital stays and 3 ED visits in 6 months admitted on 06/14/2021 with HCAP, sepsis and acute on chronic respiratory failure with hypoxia/hypercapnia due to HCAP with COPD acute exacerbation.  Seen by inpatient palliative medicine team 05/31/2021.   Clinical Assessment and Goals of Care: I have reviewed medical records including EPIC notes, labs and imaging, received report from RN, assessed the patient.  Paula Torres is lying quietly on the stretcher in her ED room.  She has BiPAP in place, and is resting comfortable.  I gently touch her arm, calling her name, but she does not wake.  Due to the acuity of her illness, I let her sleep.  Per nursing staff, at times Paula Torres is alert, able to make her needs known.  There is no family at bedside at this time although daughter, Paula Torres, has called for updates.  Call to daughter, Paula Torres to discuss diagnosis prognosis, [place], EOL wishes, disposition and options.  I introduced Palliative Medicine as specialized medical care for people living with serious illness. It focuses on providing relief from the symptoms and stress of a serious illness. The goal is to improve quality of life for both the patient and the family.  We discussed a brief life review of the patient.  Paula Torres tells me that Paula Torres has been married to her husband since the 22s.  She has 2 children, daughter  Paula Torres who lives in the home and a son.  She tells me that Paula Torres is now at [facility] under short-term rehab since July 29 of this year for a broken foot.  I ask what the doctors have told her, and Paula Torres is able to accurately tell me her mother's acute health concerns and the treatment plan.  Paula Torres shares that Paula [facility] went [facility] discharge for short-term rehab.  She shares that Paula Torres is unable to wear her CPAP at home, due to anxiety.  We talked about time for outcomes.  Advanced directives, concepts specific to code status, were considered and discussed.  Paula Torres shares that Paula Torres broke her shoulder somewhat before last and was told that she could not have it repaired due to her COPD, risk for not being able to extubate.  Paula Torres shares that Paula Torres has said that she would want attempted resuscitation and life support, but not long-term.  Palliative Care services outpatient were explained and offered, with daughter Paula Torres.  To be discussed with patient when appropriate  Discussed the importance of continued conversation  with family and the medical providers regarding overall plan of care and treatment options, ensuring decisions are within the context of the patient's values and GOCs. Questions and concerns were addressed.  The family was encouraged to call with questions or concerns.  PMT will continue to support holistically.  Conference with attending, nursing staff, transition of care team related to patient condition, needs, goals of care.      HCPOA  NEXT OF KIN -spouse who is in [place] under short-term rehab.  Paula Torres lives in the home as a caregiver    SUMMARY OF RECOMMENDATIONS   At this point full scope/full code Time for outcomes. Anticipate need for more short-term rehab May need long-term care   Code Status/Advance Care Planning: Full code  Symptom Management:  Per hospitalist, no  additional needs at this time.  Palliative Prophylaxis:  Frequent Pain Assessment and Oral Care  Additional Recommendations (Limitations, Scope, Preferences): Full Scope Treatment  Psycho-social/Spiritual:  Desire for further Chaplaincy support:no Additional Recommendations: Caregiving  Support/Resources  Prognosis:  Unable to determine, guarded at this point.  Discharge Planning:  To be determined, based on outcomes.  Anticipate need for further short-term rehab.  May need long-term care.       Primary Diagnoses: Present on Admission:  HCAP (healthcare-associated pneumonia)  Wound infection_left lower leg  Pulmonary hypertension (HCC)  PAF (paroxysmal atrial fibrillation) (HCC)  OSA (obstructive sleep apnea)  Obesity, Class III, BMI 40-49.9 (morbid obesity) (HCC)  Iron deficiency anemia  COPD with acute exacerbation (HCC)  Chronic diastolic CHF (congestive heart failure) (HCC)  Acute on chronic respiratory failure with hypoxia and hypercapnia (HCC)  Sepsis (HCC)  CAD (coronary artery disease)   I have reviewed the medical record, interviewed the patient and family, and examined the patient. The following aspects are pertinent.  Past Medical History:  Diagnosis Date   (HFpEF) heart failure with preserved ejection fraction (HCC)    Chronic respiratory failure with hypoxia (HCC)    COPD (chronic obstructive pulmonary disease) (HCC)    Coronary artery disease, non-occlusive    Hypertension    OSA (obstructive sleep apnea)    PAF (paroxysmal atrial fibrillation) (HCC)    Pulmonary hypertension ([place])    Social History   Socioeconomic History   Marital status: Married    Spouse name: Not on file   Number of children: Not on file   Years of education: Not on file   Highest education level: Not on file  Occupational History   Not on file  Tobacco Use   Smoking status: Former    Types: Cigarettes    Quit date: 06/03/2017    Years since quitting: 4.0   Smokeless  tobacco: Never  Vaping Use   Vaping Use: Never used  Substance and Sexual Activity   Alcohol use: Not Currently   Drug use: Never   Sexual activity: Not on file  Other Topics Concern   Not on file  Social History Narrative   Not on file   Social Determinants of Health   Financial Resource Strain: Not on file  Food Insecurity: Not on file  Transportation Needs: Not on file  Physical Activity: Not on file  Stress: Not on file  Social Connections: Not on file   Family History  Problem Relation Age of Onset   Hypertension Other    Scheduled Meds:  apixaban  5 mg Oral BID   atorvastatin  20 mg Oral QHS   collagenase   Topical Daily   famotidine  20 mg Oral QHS   ferrous gluconate  324 mg Oral Weekly   folic acid  1 mg Oral Daily   ipratropium  0.5 mg Nebulization Q4H   loratadine  10 mg Oral Daily   methylPREDNISolone (SOLU-MEDROL) injection  60 mg Intravenous Q12H   montelukast  10 mg Oral QHS   Treprostinil Diolamine ER  0.25 mg Oral BID   Continuous Infusions:  ceFEPime (MAXIPIME) IV 2 g (06/13/21 1335)   vancomycin     PRN Meds:.acetaminophen, ALPRAZolam, dextromethorphan-guaiFENesin, docusate sodium, hydrALAZINE, levalbuterol, ondansetron (ZOFRAN) IV Medications Prior to Admission:  Prior to Admission medications   Medication Sig Start Date End Date Taking? Authorizing Provider  atorvastatin (LIPITOR) 20 MG tablet Take 1 tablet (20 mg total) by mouth daily at 6 PM. Patient taking differently: Take 20 mg by mouth at bedtime. 11/23/19  Yes Loel Dubonnet, NP  bisoprolol (ZEBETA) 10 MG tablet Take 1 tablet (10 mg total) by mouth 2 (two) times daily. 06/08/21  Yes Cherene Altes, MD  cefadroxil (DURICEF) 500 MG capsule Take 2 capsules (1,000 mg total) by mouth 2 (two) times daily for 7 days. 06/08/21 06/15/21 Yes Cherene Altes, MD  collagenase (SANTYL) ointment Apply topically daily. 06/09/21  Yes Cherene Altes, MD  diltiazem (CARDIZEM) 60 MG tablet Take 1  tablet (60 mg total) by mouth every 8 (eight) hours. 06/08/21  Yes Cherene Altes, MD  famotidine (PEPCID) 20 MG tablet Take 20 mg by mouth at bedtime.   Yes [provider]  ferrous gluconate (FERGON) 324 MG tablet Take 324 mg by mouth once a week.   Yes [provider]  Fluticasone-Umeclidin-Vilant 100-62.5-25 MCG/INH AEPB Inhale 1 puff into the lungs daily.   Yes [provider]  folic acid (FOLVITE) 1 MG tablet Take 1 tablet (1 mg total) by mouth daily. 06/09/21  Yes Cherene Altes, MD  levalbuterol (XOPENEX) 1.25 MG/0.5ML nebulizer solution Take 1.25 mg by nebulization 3 (three) times daily. 06/08/21  Yes Cherene Altes, MD  loratadine (CLARITIN) 10 MG tablet Take 1 tablet (10 mg total) by mouth daily. 05/13/20  Yes Nicole Kindred A, DO  montelukast (SINGULAIR) 10 MG tablet Take 10 mg by mouth at bedtime.   Yes [provider]  Treprostinil Diolamine ER 0.25 MG TBCR Take 0.25 mg by mouth in the morning and at bedtime.   Yes [provider]  acetaminophen (TYLENOL) 325 MG tablet Take 2 tablets (650 mg total) by mouth every 6 (six) hours as needed for mild pain, fever, moderate pain or headache. 06/08/21   Cherene Altes, MD  dextromethorphan-guaiFENesin (MUCINEX DM) 30-600 MG 12hr tablet Take 1 tablet by mouth 2 (two) times daily as needed for cough. 06/08/21   Cherene Altes, MD  docusate sodium (COLACE) 100 MG capsule Take 1 tablet once or twice daily as needed for constipation while taking narcotic pain medicine 05/25/21   Hinda Kehr, MD  fluticasone [facility]) 50 MCG/ACT nasal spray Place 2 sprays into both nostrils daily as needed for allergies.    [provider]  levalbuterol Penne Lash) 0.63 MG/3ML nebulizer solution Take 3 mLs (0.63 mg total) by nebulization every 4 (four) hours as needed for wheezing or shortness of breath. 06/08/21   Cherene Altes, MD  Vitamin D, Ergocalciferol, (DRISDOL) 1.25 MG (50000 UNIT) CAPS  capsule Take 50,000 Units by mouth every Saturday.    [provider]   Allergies  Allergen Reactions   Codeine  Back Headache   Sulfa Antibiotics     Reported to pt from mother who is no longer living -  Details unknown   Review of Systems  Unable to perform ROS: Other   Physical Exam Vitals and nursing note reviewed.    Vital Signs: BP 111/80   Pulse (!) 59   Temp (!) 97.4 F (36.3 C) (Axillary)   Resp 17   Ht _0  (1.702 m)   Wt (!) 140 kg   SpO2 100%   BMI 48.34 kg/m  Pain Scale: 0-10   Pain Score: 0-No pain   SpO2: SpO2: 100 % O2 Device:SpO2: 100 % O2 Flow Rate: .O2 Flow Rate (L/min): 6 L/min  IO: Intake/output summary: No intake or output data in the 24 hours ending 06/13/21 1356  LBM:   Baseline Weight: Weight: (!) 140 kg Most recent weight: Weight: (!) 140 kg     Palliative Assessment/Data:   Flowsheet Rows    Flowsheet Row Most Recent Value  Intake Tab   Referral Department Hospitalist  Unit at Time of Referral Cardiac/Telemetry Unit  Palliative Care Primary Diagnosis Cardiac  Date Notified 06/13/21  Palliative Care Type Return patient Palliative Care  Reason for referral Clarify Goals of Care  Date of Admission 06/09/2021  Date first seen by Palliative Care 06/13/21  # of days Palliative referral response time 0 Day(s)  # of days IP prior to Palliative referral 1  Clinical Assessment   Palliative Performance Scale Score 40%  Pain Max last 24 hours Not able to report  Pain Min Last 24 hours Not able to report  Dyspnea Max Last 24 Hours Not able to report  Dyspnea Min Last 24 hours Not able to report  Psychosocial & Spiritual Assessment   Palliative Care Outcomes        Time In: 1330 Time Out: 1420 Time Total: 70 minutes  Greater than 50%  of this time was spent counseling and coordinating care related to the above assessment and plan.  Signed by: Drue Novel, NP   Please contact Palliative Medicine Team phone at  [phone] for questions and concerns.  For individual provider: See Shea Evans

## 2021-06-13 NOTE — ED Notes (Signed)
[facility] RN aware of assigned bed

## 2021-06-13 NOTE — Progress Notes (Signed)
PROGRESS NOTE    Paula Torres  DPO:[id number] DOB: December 20, 1950 DOA: 07/03/2021 PCP: Rusty Aus, MD    Brief Narrative:  70 y.o. female with medical history significant of dCHF, hyperlipidemia, COPD on 3-4 L oxygen, GERD, depression, PAF (was on Eliquis which is on hold due to recent leg wound bleeding), pulmonary hypertension, OSA on BiPAP, CAD, iron deficiency anemia, CKD-3A, who presents with AMS and SOB.   Per her daughter at the bedside, patient normally needs to wear BiPAP for OSA, but patient is fully compliant with BiPAP.  When she does not use BiPAP, she often becomes confused due to elevated CO2 level.  Today patient was found to be confused again.  She has some shortness of breath, no worsening cough, no fever or chills.  Patient has some left-sided chest pain.  No active nausea ,vomiting, diarrhea noted. When I saw pt in ED, she is confused, knows her own name, not oriented to place and time. Of note, patient was recently hospitalized from 9/25 to 10/6 due to left lower leg wound infection with surrounding cellulitis.  Patient was discharged on cefadroxil.    Patient initially had oxygen desaturated to 88% on home level 3 L oxygen, BiPAP was started in ED.  First ABG with pH 7.24, CO2 88, O2 32.  The repeated VBG after started BiPAP showed pH 7.34, CO2 79, O2 36.  Patient remains on BiPAP the following morning.  Lethargic but does awaken and answers questions appropriately.   Assessment & Plan:   Principal Problem:   HCAP (healthcare-associated pneumonia) Active Problems:   COPD with acute exacerbation (HCC)   Obesity, Class III, BMI 40-49.9 (morbid obesity) ([place])   Pulmonary hypertension (HCC)   Acute on chronic respiratory failure with hypoxia and hypercapnia (HCC)   Wound infection_left lower leg   Chronic diastolic CHF (congestive heart failure) (HCC)   Iron deficiency anemia   PAF (paroxysmal atrial fibrillation) (HCC)   OSA (obstructive sleep apnea)   Sepsis  (HCC)   CAD (coronary artery disease)  Sepsis and acute on chronic respiratory failure with hypoxia and hypercapnia due to HCAP healthcare-associated pneumonia) and COPD with acute exacerbation ([place])  Patient meets criteria for sepsis with WBC 18.2, tachycardia with heart rate 93, tachypnea with RR 23.  Lactic acid is normal 1.0. Blood pressure soft, hemodynamically stable. Plan: Continue BiPAP  For today continue IV vancomycin and cefepime.  Can likely de-escalate as soon as tomorrow.  Procalcitonin negative but considering comorbidities and recent hospitalization will choose to treat empirically for the next 24 hours.  Consider discontinuation on 10/12  Intravenous steroids, Solu-Medrol 40 IV twice daily Mucolytic's Bronchodilators Urine Legionella and streptococcal antigen Follow blood and sputum cultures, if able to produce Monitor vitals and fever curve   Obesity, Class III, BMI 40-49.9 (morbid obesity) (HCC) -Diet and exercise.   -Encouraged to lose weight.    Pulmonary hypertension (HCC) -Continue home treprostinil   Wound infection_left lower leg -consult wound care, recommendations appreciated   Chronic diastolic CHF (congestive heart failure) ([place])  2D echo on 01/31/2020 showed EF of 30 to 35%.   BNP 515, no pulm edema on chest x-ray, patient does not seem to have CHF exacerbation, but obviously at high risk for developing CHF exacerbation Plan: -Hold diuretics for now due to sepsis -Monitor volume status carefully    Iron deficiency anemia -Continue iron supplement   PAF (paroxysmal atrial fibrillation) ([place])  Patient was on Eliquis before, which is on hold due to recent left  leg wound bleeding -Continue Eliquis -Hold Zebeta and Cardizem due to softer blood pressure   OSA (obstructive sleep apnea) -BiPAP   CAD (coronary artery disease) -Lipitor   DVT prophylaxis: Eliquis Code Status: Full Family Communication: Daughter Alyse Low [phone] on  10/11 Disposition Plan: Status is: Inpatient  Remains inpatient appropriate because:Inpatient level of care appropriate due to severity of illness  Dispo: The patient is from: SNF              Anticipated d/c is to: SNF              Patient currently is not medically stable to d/c.   Difficult to place patient No       Level of care: Progressive Cardiac  Consultants:  None  Procedures:  None  Antimicrobials: Vancomycin Cefepime   Subjective: Seen and examined.  Lethargic.  Unable to participate in interview.  Objective: Vitals:   06/13/21 0645 06/13/21 0800 06/13/21 1100 06/13/21 1200  BP: 126/89 109/69 114/88 99/82  Pulse: 67 66 (!) 58 60  Resp: _0 Temp:      TempSrc:      SpO2: 100% 100% 95% 100%  Weight:      Height:       No intake or output data in the 24 hours ending 06/13/21 1338 Filed Weights   06/15/2021 1154  Weight: (!) 140 kg    Examination:  General exam: Lethargic Respiratory system: Bibasilar crackles.  Normal work of breathing.  BiPAP Cardiovascular system: S1-S2, regular rate, irregular rhythm, no murmurs, trace pedal edema Gastrointestinal system: Obese, NT/ND, normal bowel sounds Central nervous system: Lethargic.  Unable to assess orientation. Extremities: Diffusely decreased power bilaterally Skin: Ulcer on left shin Psychiatry: Judgement and insight appear impaired. Mood & affect flattened.     Data Reviewed: I have personally reviewed following labs and imaging studies  CBC: Recent Labs  Lab 06/07/21 0508 07/02/2021 1140 06/13/21 0650  WBC 18.4* 18.2* 12.9*  NEUTROABS  --  16.5*  --   HGB 8.4* 9.3* 9.3*  HCT 29.1* 32.4* 30.5*  MCV 92.1 93.1 90.2  PLT 276 322 588   Basic Metabolic Panel: Recent Labs  Lab 06/07/21 0508 06/08/21 0436 07/02/2021 1140 06/13/21 0650  NA 141 140 139 136  K 3.9 3.7 4.3 4.7  CL 97* 97* 94* 95*  CO2 35* 38* 38* 33*  GLUCOSE 86 95 108* 161*  BUN 28* 26* 18 23  CREATININE 1.13*  1.05* 0.99 1.02*  CALCIUM 8.4* 8.3* 8.8* 8.9  MG 2.4  --   --   --    GFR: Estimated Creatinine Clearance: 76.4 mL/min (A) (by C-G formula based on SCr of 1.02 mg/dL (H)). Liver Function Tests: Recent Labs  Lab 06/08/21 0436 06/04/2021 1140  AST 10* 12*  ALT 15 13  ALKPHOS 73 88  BILITOT 1.1 1.1  PROT 5.7* 6.1*  ALBUMIN 3.0* 3.3*   No results for input(s): LIPASE, AMYLASE in the last 168 hours. Recent Labs  Lab 06/08/21 0436  AMMONIA <10   Coagulation Profile: Recent Labs  Lab 07/02/2021 1140  INR 1.0   Cardiac Enzymes: No results for input(s): CKTOTAL, CKMB, CKMBINDEX, TROPONINI in the last 168 hours. BNP (last 3 results) No results for input(s): PROBNP in the last 8760 hours. HbA1C: No results for input(s): HGBA1C in the last 72 hours. CBG: No results for input(s): GLUCAP in the last 168 hours. Lipid Profile: No results for input(s): CHOL, HDL, LDLCALC, TRIG, CHOLHDL, LDLDIRECT  in the last 72 hours. Thyroid Function Tests: No results for input(s): TSH, T4TOTAL, FREET4, T3FREE, THYROIDAB in the last 72 hours. Anemia Panel: No results for input(s): VITAMINB12, FOLATE, FERRITIN, TIBC, IRON, RETICCTPCT in the last 72 hours. Sepsis Labs: Recent Labs  Lab 06/05/2021 1140 06/13/21 0650  PROCALCITON <0.10  --   LATICACIDVEN 1.0 1.3    Recent Results (from the past 240 hour(s))  Resp Panel by RT-PCR (Flu A&B, Covid) Nasopharyngeal Swab     Status: None   Collection Time: 06/08/21  1:15 PM   Specimen: Nasopharyngeal Swab; Nasopharyngeal(NP) swabs in vial transport medium  Result Value Ref Range Status   SARS Coronavirus 2 by RT PCR NEGATIVE NEGATIVE Final    Comment: (NOTE) SARS-CoV-2 target nucleic acids are NOT DETECTED.  The SARS-CoV-2 RNA is generally detectable in upper respiratory specimens during the acute phase of infection. The lowest concentration of SARS-CoV-2 viral copies this assay can detect is 138 copies/mL. A negative result does not preclude  SARS-Cov-2 infection and should not be used as the sole basis for treatment or other patient management decisions. A negative result may occur with  improper specimen collection/handling, submission of specimen other than nasopharyngeal swab, presence of viral mutation(s) within the areas targeted by this assay, and inadequate number of viral copies(<138 copies/mL). A negative result must be combined with clinical observations, patient history, and epidemiological information. The expected result is Negative.  Fact Sheet for Patients:  EntrepreneurPulse.com.au  Fact Sheet for Healthcare Providers:  IncredibleEmployment.be  This test is no t yet approved or cleared by the [place] FDA and  has been authorized for detection and/or diagnosis of SARS-CoV-2 by FDA under an Emergency Use Authorization (EUA). This EUA will remain  in effect (meaning this test can be used) for the duration of the COVID-19 declaration under Section 564(b)(1) of the Act, 21 U.S.C.section 360bbb-3(b)(1), unless the authorization is terminated  or revoked sooner.       Influenza A by PCR NEGATIVE NEGATIVE Final   Influenza B by PCR NEGATIVE NEGATIVE Final    Comment: (NOTE) The Xpert Xpress SARS-CoV-2/FLU/RSV plus assay is intended as an aid in the diagnosis of influenza from Nasopharyngeal swab specimens and should not be used as a sole basis for treatment. Nasal washings and aspirates are unacceptable for Xpert Xpress SARS-CoV-2/FLU/RSV testing.  Fact Sheet for Patients: EntrepreneurPulse.com.au  Fact Sheet for Healthcare Providers: IncredibleEmployment.be  This test is not yet approved or cleared by the [place] FDA and has been authorized for detection and/or diagnosis of SARS-CoV-2 by FDA under an Emergency Use Authorization (EUA). This EUA will remain in effect (meaning this test can be used) for the duration of  the COVID-19 declaration under Section 564(b)(1) of the Act, 21 U.S.C. section 360bbb-3(b)(1), unless the authorization is terminated or revoked.  Performed at [facility], [address] [place], [place] [address]   Blood Culture (routine x 2)     Status: None (Preliminary result)   Collection Time: 06/29/2021 11:40 AM   Specimen: BLOOD  Result Value Ref Range Status   Specimen Description BLOOD RIGHT ANTECUBITAL  Final   Special Requests   Final    BOTTLES DRAWN AEROBIC AND ANAEROBIC Blood Culture adequate volume   Culture   Final    NO GROWTH < 24 HOURS Performed at [facility], [address] [place],  [address]    Report Status PENDING  Incomplete  Resp Panel by RT-PCR (Flu A&B, Covid) Nasopharyngeal Swab  Status: None   Collection Time: 06/19/2021  3:08 PM   Specimen: Nasopharyngeal Swab; Nasopharyngeal(NP) swabs in vial transport medium  Result Value Ref Range Status   SARS Coronavirus 2 by RT PCR NEGATIVE NEGATIVE Final    Comment: (NOTE) SARS-CoV-2 target nucleic acids are NOT DETECTED.  The SARS-CoV-2 RNA is generally detectable in upper respiratory specimens during the acute phase of infection. The lowest concentration of SARS-CoV-2 viral copies this assay can detect is 138 copies/mL. A negative result does not preclude SARS-Cov-2 infection and should not be used as the sole basis for treatment or other patient management decisions. A negative result may occur with  improper specimen collection/handling, submission of specimen other than nasopharyngeal swab, presence of viral mutation(s) within the areas targeted by this assay, and inadequate number of viral copies(<138 copies/mL). A negative result must be combined with clinical observations, patient history, and epidemiological information. The expected result is Negative.  Fact Sheet for Patients:  EntrepreneurPulse.com.au  Fact Sheet for Healthcare Providers:   IncredibleEmployment.be  This test is no t yet approved or cleared by the [place] FDA and  has been authorized for detection and/or diagnosis of SARS-CoV-2 by FDA under an Emergency Use Authorization (EUA). This EUA will remain  in effect (meaning this test can be used) for the duration of the COVID-19 declaration under Section 564(b)(1) of the Act, 21 U.S.C.section 360bbb-3(b)(1), unless the authorization is terminated  or revoked sooner.       Influenza A by PCR NEGATIVE NEGATIVE Final   Influenza B by PCR NEGATIVE NEGATIVE Final    Comment: (NOTE) The Xpert Xpress SARS-CoV-2/FLU/RSV plus assay is intended as an aid in the diagnosis of influenza from Nasopharyngeal swab specimens and should not be used as a sole basis for treatment. Nasal washings and aspirates are unacceptable for Xpert Xpress SARS-CoV-2/FLU/RSV testing.  Fact Sheet for Patients: EntrepreneurPulse.com.au  Fact Sheet for Healthcare Providers: IncredibleEmployment.be  This test is not yet approved or cleared by the [place] FDA and has been authorized for detection and/or diagnosis of SARS-CoV-2 by FDA under an Emergency Use Authorization (EUA). This EUA will remain in effect (meaning this test can be used) for the duration of the COVID-19 declaration under Section 564(b)(1) of the Act, 21 U.S.C. section 360bbb-3(b)(1), unless the authorization is terminated or revoked.  Performed at [facility], [address], [place], [place] [address]          Radiology Studies: CT HEAD WO CONTRAST (5MM)  Result Date: 06/17/2021 CLINICAL DATA:  Mental status change, unknown cause. EXAM: CT HEAD WITHOUT CONTRAST TECHNIQUE: Contiguous axial images were obtained from the base of the skull through the vertex without intravenous contrast. COMPARISON:  Prior head CT examinations 05/28/2021 and earlier. FINDINGS: Brain: Mild generalized cerebral  atrophy. Mild to moderate patchy and ill-defined hypoattenuation within the cerebral white matter, nonspecific but compatible with chronic small vessel ischemic disease. There is no acute intracranial hemorrhage. No demarcated cortical infarct. No extra-axial fluid collection. No evidence of an intracranial mass. No midline shift. Partially empty sella turcica. Vascular: No hyperdense vessel. Atherosclerotic calcifications. Skull: Normal. Negative for fracture or focal lesion. Sinuses/Orbits: Visualized orbits show no acute finding. Trace bilateral ethmoid sinus mucosal thickening. Small mucous retention cyst within a right ethmoid air cell. IMPRESSION: No evidence of acute intracranial abnormality. Mild-to-moderate chronic small vessel ischemic changes within the cerebral white matter. Mild generalized cerebral atrophy. Mild ethmoid sinus disease, as described. Electronically Signed   By: Kellie Simmering D.O.  On: 06/14/2021 16:01   DG Chest Port 1 View  Result Date: 06/22/2021 CLINICAL DATA:  Questionable sepsis - evaluate for abnormality new consolidation in the right lower EXAM: PORTABLE CHEST 1 VIEW COMPARISON:  05/28/2021. FINDINGS: New consolidation in the right lower lobe. Chronic interstitial prominence, slightly increased in conspicuity on the right. No visible pleural effusions or pneumothorax. Limited evaluation left lung apex to overlapping head/neck. Similar enlargement the cardiac silhouette. Calcific atherosclerosis aorta. Chronic right rib fractures. Chronic left proximal humerus fracture. IMPRESSION: 1. New consolidation in the right lower lobe, concerning for pneumonia. 2. Chronic interstitial prominence, slightly increased in conspicuity on the right. This could represent chronic change, but infection or mild asymmetric edema are considerations. Electronically Signed   By: Margaretha Sheffield M.D.   On: 06/25/2021 12:40        Scheduled Meds:  apixaban  5 mg Oral BID   atorvastatin  20  mg Oral QHS   collagenase   Topical Daily   famotidine  20 mg Oral QHS   ferrous gluconate  324 mg Oral Weekly   folic acid  1 mg Oral Daily   ipratropium  0.5 mg Nebulization Q4H   loratadine  10 mg Oral Daily   methylPREDNISolone (SOLU-MEDROL) injection  60 mg Intravenous Q12H   montelukast  10 mg Oral QHS   Treprostinil Diolamine ER  0.25 mg Oral BID   Continuous Infusions:  ceFEPime (MAXIPIME) IV 2 g (06/13/21 1335)   vancomycin       LOS: 1 day    Time spent: 35 minutes    Sidney Ace, MD [facility]   If 7PM-7AM, please contact night-coverage  06/13/2021, 1:38 PM

## 2021-06-13 NOTE — ED Notes (Signed)
Pt started to get anxious and her oxygen went into the upper 70s and low 80s; pt was explained therapeutic breathing to and continued to freak out. Pt was put on a non rebreather and began to breathe slower and calmed down.

## 2021-06-13 NOTE — Consult Note (Signed)
[place] Nurse Consult Note: Patient receiving care in [facility] ED30. Reason for Consult: left leg wound Wound type: From reviewing all photos of the left leg in the chart, it appears the left knee wound started as a bruise, see photo placed on 05/30/21 and labeled as 05/25/21. There are three other photos from 05/30/21 that shows progression of the knee to eschar, and extensive bruising of the LLE. Pressure Injury POA: Yes/No/NA Measurement: Wound bed: see photos from yesterday 06/03/2021. Drainage (amount, consistency, odor)  Periwound: bruised Dressing procedure/placement/frequency: There was already an order for Santyl on the [facility]. I have modified it to the following:  Apply Santyl to left knee wounds in a nickel thick layer. Cover with a saline moistened gauze, then dry gauze or ABD pad.  Change daily.  Monitor the wound area(s) for worsening of condition such as: Signs/symptoms of infection,  Increase in size,  Development of or worsening of odor, Development of pain, or increased pain at the affected locations.  Notify the medical team if any of these develop.  Thank you for the consult. [place] nurse will not follow at this time.  Please re-consult the [place] team if needed.  Val Riles, RN, MSN, CWOCN, CNS-BC, pager [phone]

## 2021-06-13 NOTE — Progress Notes (Signed)
Patient placed on BIPAP. O2 sats 97%

## 2021-06-14 DIAGNOSIS — Z515 Encounter for palliative care: Secondary | ICD-10-CM | POA: Diagnosis not present

## 2021-06-14 DIAGNOSIS — Z7189 Other specified counseling: Secondary | ICD-10-CM | POA: Diagnosis not present

## 2021-06-14 DIAGNOSIS — J189 Pneumonia, unspecified organism: Secondary | ICD-10-CM | POA: Diagnosis not present

## 2021-06-14 DIAGNOSIS — J9621 Acute and chronic respiratory failure with hypoxia: Secondary | ICD-10-CM | POA: Diagnosis not present

## 2021-06-14 DIAGNOSIS — L8990 Pressure ulcer of unspecified site, unspecified stage: Secondary | ICD-10-CM | POA: Insufficient documentation

## 2021-06-14 LAB — URINALYSIS, COMPLETE (UACMP) WITH MICROSCOPIC
Bilirubin Urine: NEGATIVE
Glucose, UA: NEGATIVE mg/dL
Hgb urine dipstick: NEGATIVE
Ketones, ur: 5 mg/dL — AB
Nitrite: NEGATIVE
Protein, ur: 30 mg/dL — AB
Specific Gravity, Urine: 1.027 (ref 1.005–1.030)
pH: 5 (ref 5.0–8.0)

## 2021-06-14 LAB — BASIC METABOLIC PANEL
Anion gap: 5 (ref 5–15)
BUN: 32 mg/dL — ABNORMAL HIGH (ref 8–23)
CO2: 33 mmol/L — ABNORMAL HIGH (ref 22–32)
Calcium: 8.9 mg/dL (ref 8.9–10.3)
Chloride: 98 mmol/L (ref 98–111)
Creatinine, Ser: 1.06 mg/dL — ABNORMAL HIGH (ref 0.44–1.00)
GFR, Estimated: 57 mL/min — ABNORMAL LOW (ref >60)
Glucose, Bld: 175 mg/dL — ABNORMAL HIGH (ref 70–99)
Potassium: 4.4 mmol/L (ref 3.5–5.1)
Sodium: 136 mmol/L (ref 135–145)

## 2021-06-14 LAB — CBC WITH DIFFERENTIAL/PLATELET
Abs Immature Granulocytes: 0.11 10*3/uL — ABNORMAL HIGH (ref 0.00–0.07)
Basophils Absolute: 0 10*3/uL (ref 0.0–0.1)
Basophils Relative: 0 %
Eosinophils Absolute: 0 10*3/uL (ref 0.0–0.5)
Eosinophils Relative: 0 %
HCT: 27.2 % — ABNORMAL LOW (ref 36.0–46.0)
Hemoglobin: 8.1 g/dL — ABNORMAL LOW (ref 12.0–15.0)
Immature Granulocytes: 1 %
Lymphocytes Relative: 2 %
Lymphs Abs: 0.2 10*3/uL — ABNORMAL LOW (ref 0.7–4.0)
MCH: 26.9 pg (ref 26.0–34.0)
MCHC: 29.8 g/dL — ABNORMAL LOW (ref 30.0–36.0)
MCV: 90.4 fL (ref 80.0–100.0)
Monocytes Absolute: 0.2 10*3/uL (ref 0.1–1.0)
Monocytes Relative: 1 %
Neutro Abs: 11.3 10*3/uL — ABNORMAL HIGH (ref 1.7–7.7)
Neutrophils Relative %: 96 %
Platelets: 271 10*3/uL (ref 150–400)
RBC: 3.01 MIL/uL — ABNORMAL LOW (ref 3.87–5.11)
RDW: 18.4 % — ABNORMAL HIGH (ref 11.5–15.5)
WBC: 11.8 10*3/uL — ABNORMAL HIGH (ref 4.0–10.5)
nRBC: 0 % (ref 0.0–0.2)

## 2021-06-14 LAB — STREP PNEUMONIAE URINARY ANTIGEN: Strep Pneumo Urinary Antigen: NEGATIVE

## 2021-06-14 LAB — MRSA NEXT GEN BY PCR, NASAL: MRSA by PCR Next Gen: NOT DETECTED

## 2021-06-14 MED ORDER — METHYLPREDNISOLONE SODIUM SUCC 125 MG IJ SOLR
60.0000 mg | Freq: Every day | INTRAMUSCULAR | Status: DC
  Administered 2021-06-15 – 2021-06-16 (×2): 60 mg via INTRAVENOUS
  Filled 2021-06-14 (×2): qty 2

## 2021-06-14 MED ORDER — VANCOMYCIN HCL 1500 MG/300ML IV SOLN
1500.0000 mg | INTRAVENOUS | Status: DC
  Administered 2021-06-14: 1500 mg via INTRAVENOUS
  Filled 2021-06-14: qty 300

## 2021-06-14 NOTE — Progress Notes (Signed)
PROGRESS NOTE    Paula Torres  HMC:[id number] DOB: April 08, 1951 DOA: 06/09/2021 PCP: Rusty Aus, MD    Brief Narrative:  70 y.o. female with medical history significant of dCHF, hyperlipidemia, COPD on 3-4 L oxygen, GERD, depression, PAF (was on Eliquis which is on hold due to recent leg wound bleeding), pulmonary hypertension, OSA on BiPAP, CAD, iron deficiency anemia, CKD-3A, who presents with AMS and SOB.   Per her daughter at the bedside, patient normally needs to wear BiPAP for OSA, but patient is fully compliant with BiPAP.  When she does not use BiPAP, she often becomes confused due to elevated CO2 level.  Today patient was found to be confused again.  She has some shortness of breath, no worsening cough, no fever or chills.  Patient has some left-sided chest pain.  No active nausea ,vomiting, diarrhea noted. When I saw pt in ED, she is confused, knows her own name, not oriented to place and time. Of note, patient was recently hospitalized from 9/25 to 10/6 due to left lower leg wound infection with surrounding cellulitis.  Patient was discharged on cefadroxil.    Patient initially had oxygen desaturated to 88% on home level 3 L oxygen, BiPAP was started in ED.  First ABG with pH 7.24, CO2 88, O2 32.  The repeated VBG after started BiPAP showed pH 7.34, CO2 79, O2 36.  Patient remains on BiPAP the following morning.  Lethargic but does awaken and answers questions appropriately.  10/12 refused bipap last night  Assessment & Plan:   Principal Problem:   HCAP (healthcare-associated pneumonia) Active Problems:   COPD with acute exacerbation (HCC)   Obesity, Class III, BMI 40-49.9 (morbid obesity) ([place])   Pulmonary hypertension (HCC)   Acute on chronic respiratory failure with hypoxia and hypercapnia (HCC)   Wound infection_left lower leg   Chronic diastolic CHF (congestive heart failure) (HCC)   Iron deficiency anemia   PAF (paroxysmal atrial fibrillation) (HCC)   OSA  (obstructive sleep apnea)   Sepsis (HCC)   CAD (coronary artery disease)  Sepsis and acute on chronic respiratory failure with hypoxia and hypercapnia due to HCAP healthcare-associated pneumonia) and COPD with acute exacerbation ([place])  Patient meets criteria for sepsis with WBC 18.2, tachycardia with heart rate 93, tachypnea with RR 23.  Lactic acid is normal 1.0. Blood pressure soft, hemodynamically stable. 10/12 continue BiPAP Sepsis ruled out Procalcitonin negative Will DC IV antibiotics Discussed with patient importance of using her BiPAP especially at nighttime when she is asleep Continue Xanax Decrease IV steroids to daily from twice daily dosing Dilators Urine Legionella and strep antigen   Obesity, Class III, BMI 40-49.9 (morbid obesity) ([place]) Complicates overall prognosis Courage to lose weight  Pulmonary hypertension ([place]) -Continue home treprostinil    Wound infection_left lower leg -wound care consulted, plz see note    Chronic diastolic CHF (congestive heart failure) ([place])  2D echo on 01/31/2020 showed EF of 30 to 35%.   BNP 515, no pulm edema on chest x-ray, patient does not seem to have CHF exacerbation, but obviously at high risk for developing CHF exacerbation Plan: -Hold diuretics for now due to sepsis -Monitor volume status carefully    Iron deficiency anemia -Continue iron supplement   PAF (paroxysmal atrial fibrillation) ([place])  Patient was on Eliquis before, which is on hold due to recent left leg wound bleeding -Continue Eliquis -Hold Zebeta and Cardizem due to softer blood pressure   OSA (obstructive sleep apnea) -BiPAP   CAD (  coronary artery disease) -Lipitor   DVT prophylaxis: Eliquis Code Status: Full Family Communication: none at bedside  Disposition Plan: Status is: Inpatient  Remains inpatient appropriate because:Inpatient level of care appropriate due to severity of illness  Dispo: The patient is from: SNF               Anticipated d/c is to: SNF              Patient currently is not medically stable to d/c.   Difficult to place patient No       Level of care: Progressive Cardiac  Consultants:  None  Procedures:  None  Antimicrobials: Vancomycin Cefepime   Subjective: Denies cp, abd pain. Sitting awake. Cant tell me if sob.  Objective: Vitals:   06/14/21 0001 06/14/21 0231 06/14/21 0246 06/14/21 0741  BP:  126/65  136/71  Pulse:  61  62  Resp: _0 Temp:  97.8 F (36.6 C)  98 F (36.7 C)  TempSrc:  Oral  Oral  SpO2:  99%  100%  Weight:   (!) 140.2 kg   Height:        Intake/Output Summary (Last 24 hours) at 06/14/2021 0916 Last data filed at 06/14/2021 0600 Gross per 24 hour  Intake 1101.14 ml  Output 300 ml  Net 801.14 ml   Filed Weights   06/17/2021 1154 06/14/21 0246  Weight: (!) 140 kg (!) 140.2 kg    Examination: Awake, nad Cta no w/r Rrr s1/s2 no gallop Soft benign +bs No edema Aaxox3  Mood and affect appropriate in current setting    Data Reviewed: I have personally reviewed following labs and imaging studies  CBC: Recent Labs  Lab 06/28/2021 1140 06/13/21 0650 06/14/21 0508  WBC 18.2* 12.9* 11.8*  NEUTROABS 16.5*  --  11.3*  HGB 9.3* 9.3* 8.1*  HCT 32.4* 30.5* 27.2*  MCV 93.1 90.2 90.4  PLT 322 297 638   Basic Metabolic Panel: Recent Labs  Lab 06/08/21 0436 06/28/2021 1140 06/13/21 0650 06/14/21 0508  NA 140 139 136 136  K 3.7 4.3 4.7 4.4  CL 97* 94* 95* 98  CO2 38* 38* 33* 33*  GLUCOSE 95 108* 161* 175*  BUN 26* 18 23 32*  CREATININE 1.05* 0.99 1.02* 1.06*  CALCIUM 8.3* 8.8* 8.9 8.9   GFR: Estimated Creatinine Clearance: 73.5 mL/min (A) (by C-G formula based on SCr of 1.06 mg/dL (H)). Liver Function Tests: Recent Labs  Lab 06/08/21 0436 06/16/2021 1140  AST 10* 12*  ALT 15 13  ALKPHOS 73 88  BILITOT 1.1 1.1  PROT 5.7* 6.1*  ALBUMIN 3.0* 3.3*   No results for input(s): LIPASE, AMYLASE in the last 168 hours. Recent Labs   Lab 06/08/21 0436  AMMONIA <10   Coagulation Profile: Recent Labs  Lab 06/15/2021 1140  INR 1.0   Cardiac Enzymes: No results for input(s): CKTOTAL, CKMB, CKMBINDEX, TROPONINI in the last 168 hours. BNP (last 3 results) No results for input(s): PROBNP in the last 8760 hours. HbA1C: No results for input(s): HGBA1C in the last 72 hours. CBG: Recent Labs  Lab 06/13/21 2207  GLUCAP 182*   Lipid Profile: No results for input(s): CHOL, HDL, LDLCALC, TRIG, CHOLHDL, LDLDIRECT in the last 72 hours. Thyroid Function Tests: No results for input(s): TSH, T4TOTAL, FREET4, T3FREE, THYROIDAB in the last 72 hours. Anemia Panel: No results for input(s): VITAMINB12, FOLATE, FERRITIN, TIBC, IRON, RETICCTPCT in the last 72 hours. Sepsis Labs: Recent Labs  Lab 06/06/2021  1140 06/13/21 0650  PROCALCITON <0.10  --   LATICACIDVEN 1.0 1.3    Recent Results (from the past 240 hour(s))  Resp Panel by RT-PCR (Flu A&B, Covid) Nasopharyngeal Swab     Status: None   Collection Time: 06/08/21  1:15 PM   Specimen: Nasopharyngeal Swab; Nasopharyngeal(NP) swabs in vial transport medium  Result Value Ref Range Status   SARS Coronavirus 2 by RT PCR NEGATIVE NEGATIVE Final    Comment: (NOTE) SARS-CoV-2 target nucleic acids are NOT DETECTED.  The SARS-CoV-2 RNA is generally detectable in upper respiratory specimens during the acute phase of infection. The lowest concentration of SARS-CoV-2 viral copies this assay can detect is 138 copies/mL. A negative result does not preclude SARS-Cov-2 infection and should not be used as the sole basis for treatment or other patient management decisions. A negative result may occur with  improper specimen collection/handling, submission of specimen other than nasopharyngeal swab, presence of viral mutation(s) within the areas targeted by this assay, and inadequate number of viral copies(<138 copies/mL). A negative result must be combined with clinical observations,  patient history, and epidemiological information. The expected result is Negative.  Fact Sheet for Patients:  EntrepreneurPulse.com.au  Fact Sheet for Healthcare Providers:  IncredibleEmployment.be  This test is no t yet approved or cleared by the [place] FDA and  has been authorized for detection and/or diagnosis of SARS-CoV-2 by FDA under an Emergency Use Authorization (EUA). This EUA will remain  in effect (meaning this test can be used) for the duration of the COVID-19 declaration under Section 564(b)(1) of the Act, 21 U.S.C.section 360bbb-3(b)(1), unless the authorization is terminated  or revoked sooner.       Influenza A by PCR NEGATIVE NEGATIVE Final   Influenza B by PCR NEGATIVE NEGATIVE Final    Comment: (NOTE) The Xpert Xpress SARS-CoV-2/FLU/RSV plus assay is intended as an aid in the diagnosis of influenza from Nasopharyngeal swab specimens and should not be used as a sole basis for treatment. Nasal washings and aspirates are unacceptable for Xpert Xpress SARS-CoV-2/FLU/RSV testing.  Fact Sheet for Patients: EntrepreneurPulse.com.au  Fact Sheet for Healthcare Providers: IncredibleEmployment.be  This test is not yet approved or cleared by the [place] FDA and has been authorized for detection and/or diagnosis of SARS-CoV-2 by FDA under an Emergency Use Authorization (EUA). This EUA will remain in effect (meaning this test can be used) for the duration of the COVID-19 declaration under Section 564(b)(1) of the Act, 21 U.S.C. section 360bbb-3(b)(1), unless the authorization is terminated or revoked.  Performed at [facility], [address] [place], [place] [address]   Blood Culture (routine x 2)     Status: None (Preliminary result)   Collection Time: 06/14/2021 11:40 AM   Specimen: BLOOD  Result Value Ref Range Status   Specimen Description BLOOD RIGHT ANTECUBITAL   Final   Special Requests   Final    BOTTLES DRAWN AEROBIC AND ANAEROBIC Blood Culture adequate volume   Culture   Final    NO GROWTH 2 DAYS Performed at [facility], [address] [place], [place] [address]    Report Status PENDING  Incomplete  Resp Panel by RT-PCR (Flu A&B, Covid) Nasopharyngeal Swab     Status: None   Collection Time: 06/22/2021  3:08 PM   Specimen: Nasopharyngeal Swab; Nasopharyngeal(NP) swabs in vial transport medium  Result Value Ref Range Status   SARS Coronavirus 2 by RT PCR NEGATIVE NEGATIVE Final    Comment: (NOTE) SARS-CoV-2 target  nucleic acids are NOT DETECTED.  The SARS-CoV-2 RNA is generally detectable in upper respiratory specimens during the acute phase of infection. The lowest concentration of SARS-CoV-2 viral copies this assay can detect is 138 copies/mL. A negative result does not preclude SARS-Cov-2 infection and should not be used as the sole basis for treatment or other patient management decisions. A negative result may occur with  improper specimen collection/handling, submission of specimen other than nasopharyngeal swab, presence of viral mutation(s) within the areas targeted by this assay, and inadequate number of viral copies(<138 copies/mL). A negative result must be combined with clinical observations, patient history, and epidemiological information. The expected result is Negative.  Fact Sheet for Patients:  EntrepreneurPulse.com.au  Fact Sheet for Healthcare Providers:  IncredibleEmployment.be  This test is no t yet approved or cleared by the [place] FDA and  has been authorized for detection and/or diagnosis of SARS-CoV-2 by FDA under an Emergency Use Authorization (EUA). This EUA will remain  in effect (meaning this test can be used) for the duration of the COVID-19 declaration under Section 564(b)(1) of the Act, 21 U.S.C.section 360bbb-3(b)(1), unless the authorization is  terminated  or revoked sooner.       Influenza A by PCR NEGATIVE NEGATIVE Final   Influenza B by PCR NEGATIVE NEGATIVE Final    Comment: (NOTE) The Xpert Xpress SARS-CoV-2/FLU/RSV plus assay is intended as an aid in the diagnosis of influenza from Nasopharyngeal swab specimens and should not be used as a sole basis for treatment. Nasal washings and aspirates are unacceptable for Xpert Xpress SARS-CoV-2/FLU/RSV testing.  Fact Sheet for Patients: EntrepreneurPulse.com.au  Fact Sheet for Healthcare Providers: IncredibleEmployment.be  This test is not yet approved or cleared by the [place] FDA and has been authorized for detection and/or diagnosis of SARS-CoV-2 by FDA under an Emergency Use Authorization (EUA). This EUA will remain in effect (meaning this test can be used) for the duration of the COVID-19 declaration under Section 564(b)(1) of the Act, 21 U.S.C. section 360bbb-3(b)(1), unless the authorization is terminated or revoked.  Performed at [facility], [address] [place], [place] [address]   Culture, blood (Routine X 2) w Reflex to ID Panel     Status: None (Preliminary result)   Collection Time: 06/13/21  6:50 AM   Specimen: BLOOD  Result Value Ref Range Status   Specimen Description BLOOD RIGHT HAND  Final   Special Requests   Final    BOTTLES DRAWN AEROBIC ONLY Blood Culture results may not be optimal due to an inadequate volume of blood received in culture bottles   Culture   Final    NO GROWTH < 24 HOURS Performed at [facility], [address] [place], [place] [address]    Report Status PENDING  Incomplete         Radiology Studies: CT HEAD WO CONTRAST (5MM)  Result Date: 06/05/2021 CLINICAL DATA:  Mental status change, unknown cause. EXAM: CT HEAD WITHOUT CONTRAST TECHNIQUE: Contiguous axial images were obtained from the base of the skull through the vertex without intravenous  contrast. COMPARISON:  Prior head CT examinations 05/28/2021 and earlier. FINDINGS: Brain: Mild generalized cerebral atrophy. Mild to moderate patchy and ill-defined hypoattenuation within the cerebral white matter, nonspecific but compatible with chronic small vessel ischemic disease. There is no acute intracranial hemorrhage. No demarcated cortical infarct. No extra-axial fluid collection. No evidence of an intracranial mass. No midline shift. Partially empty sella turcica. Vascular: No hyperdense vessel. Atherosclerotic calcifications. Skull: Normal.  Negative for fracture or focal lesion. Sinuses/Orbits: Visualized orbits show no acute finding. Trace bilateral ethmoid sinus mucosal thickening. Small mucous retention cyst within a right ethmoid air cell. IMPRESSION: No evidence of acute intracranial abnormality. Mild-to-moderate chronic small vessel ischemic changes within the cerebral white matter. Mild generalized cerebral atrophy. Mild ethmoid sinus disease, as described. Electronically Signed   By: Kellie Simmering D.O.   On: 06/04/2021 16:01   DG Chest Port 1 View  Result Date: 06/19/2021 CLINICAL DATA:  Questionable sepsis - evaluate for abnormality new consolidation in the right lower EXAM: PORTABLE CHEST 1 VIEW COMPARISON:  05/28/2021. FINDINGS: New consolidation in the right lower lobe. Chronic interstitial prominence, slightly increased in conspicuity on the right. No visible pleural effusions or pneumothorax. Limited evaluation left lung apex to overlapping head/neck. Similar enlargement the cardiac silhouette. Calcific atherosclerosis aorta. Chronic right rib fractures. Chronic left proximal humerus fracture. IMPRESSION: 1. New consolidation in the right lower lobe, concerning for pneumonia. 2. Chronic interstitial prominence, slightly increased in conspicuity on the right. This could represent chronic change, but infection or mild asymmetric edema are considerations. Electronically Signed   By:  Margaretha Sheffield M.D.   On: 06/13/2021 12:40        Scheduled Meds:  apixaban  5 mg Oral BID   atorvastatin  20 mg Oral QHS   collagenase   Topical Daily   famotidine  20 mg Oral QHS   ferrous gluconate  324 mg Oral Weekly   folic acid  1 mg Oral Daily   ipratropium  0.5 mg Nebulization TID   loratadine  10 mg Oral Daily   methylPREDNISolone (SOLU-MEDROL) injection  60 mg Intravenous Q12H   montelukast  10 mg Oral QHS   Treprostinil Diolamine ER  0.25 mg Oral BID   Continuous Infusions:  ceFEPime (MAXIPIME) IV 2 g (06/14/21 0509)   vancomycin       LOS: 2 days    Time spent: 35 minutes with more than 50% on [place], MD [facility]   If 7PM-7AM, please contact night-coverage  06/14/2021, 9:16 AM

## 2021-06-14 NOTE — Consult Note (Addendum)
Pharmacy Antibiotic Note  Paula Torres is a 70 y.o. female admitted on 06/11/2021 with pneumonia. PMH consists of HFpEF, Pulmonary HTN, COPD,OSA w/BiPAP, and Afib. Chest CT 10/10 was positive for new consolidation in the right lower lobe, concerning for pneumonia.  Pt is afebrile, with negative MRSA PCR and Strep UA. Legionella U Ag pending.  BC x NGTD x 24h. Leukocytosis is trending down. (WBC 18.2>12>9>11.8).  Pharmacy has been consulted for Vancomycin dosing. Vancomycin has been adjusted via the AUC calculator utilizing IBW and Vd 0.5 for pts with a BMI>30 (48.41)   Plan: Vancomycin 1500 mg q24h. Goal AUC: 400 - 550  Est AUC 477.3 Css min: 11.8 Css max: 32.5  Scr: 1.06  Continue cefepime 2 g IV q8h  Height: _0  (170.2 cm) Weight: (!) 140.2 kg (309 lb 1.4 oz) IBW/kg (Calculated) : 61.6  Temp (24hrs), Avg:98.1 F (36.7 C), Min:97.8 F (36.6 C), Max:98.5 F (36.9 C)  Recent Labs  Lab 06/08/21 0436 06/15/2021 1140 06/13/21 0650 06/14/21 0508  WBC  --  18.2* 12.9* 11.8*  CREATININE 1.05* 0.99 1.02* 1.06*  LATICACIDVEN  --  1.0 1.3  --     Estimated Creatinine Clearance: 73.5 mL/min (A) (by C-G formula based on SCr of 1.06 mg/dL (H)).    Allergies  Allergen Reactions   Codeine     Back Headache   Sulfa Antibiotics     Reported to pt from mother who is no longer living -  Details unknown    Antimicrobials this admission: Vancomycin 10/10 >>  Cefepime 10/10 >>   Dose adjustments this admission: Vancomycin 1000 mg x1 > Vancomycin 1500 mg x 1 > Vancomycin 1750 mg x 1 > Vancomycin 1500 q24h   Microbiology results: 10/10 BCx: NG x 24h 10/11 Sputum: pending  10/12 MRSA PCR: negative  Thank you for allowing pharmacy to be a part of this patient's care.  [place] Tanyla Stege, [place] PharmD Candidate 23'  06/14/2021 2:05 PM

## 2021-06-14 NOTE — Progress Notes (Signed)
Palliative: Paula Torres is sitting up quietly in bed watching television.  She greets me, making and somewhat keeping eye contact.  She appears acutely/chronically ill, somewhat frail, morbidly obese.  Although she is alert and oriented, she does seem to be mis-remembering.  I do believe that she is able to make her basic needs known.  There is no family at bedside at this time.  I introduced palliative medicine, sharing that she met with my coworker on September 28.  Paula Torres tells me, "that was my husband".  I share that she was seen by palliative medicine on her last hospital stay.  She tells me that she has not been hospitalized in quite some time.  I share that she was at [facility] for short-term rehab, but she tells me that she was only there for "2 days".  We talked about her husband.  She tells me that they were married in 1948, when asked her again she tells me that they have been married 103 years, since 74.  We talked about where she sees herself going when she leaves the hospital, which she return [facility] for more short-term rehab if qualified.  She tells me that she is now considering going to [place] because she would like to be with her husband.  We talked about the use of BiPAP.  I shared that she declined to use BiPAP last night and asked her to tell me more about why.  She tells me, "I needed a night all".  I ask if she feels like she could use the BiPAP if she had medication for anxiety.  She tells me that her doctor started medicines 2 days ago that may help, stating that she believes this was Xanax.  We briefly talked about CODE STATUS.  We talked about the importance of the use of BiPAP so that she does not build up CO2, and has chance for improvement.  She states that she would like to try the medicines for anxiety.  Conference with attending, bedside nursing staff, transition of care team related to patient condition, needs, goals of care, disposition.  Plan:    At this point continue full scope/full code.  Time for outcomes.  Agreeable to return to short-term rehab if qualified.  79 minutes  Quinn Axe, NP Palliative medicine team Team phone [phone] Greater than 50% of this time was spent counseling and coordinating care related to the above assessment and plan.

## 2021-06-15 DIAGNOSIS — J9601 Acute respiratory failure with hypoxia: Secondary | ICD-10-CM | POA: Diagnosis not present

## 2021-06-15 DIAGNOSIS — J189 Pneumonia, unspecified organism: Secondary | ICD-10-CM | POA: Diagnosis not present

## 2021-06-15 DIAGNOSIS — Z515 Encounter for palliative care: Secondary | ICD-10-CM | POA: Diagnosis not present

## 2021-06-15 DIAGNOSIS — Z7189 Other specified counseling: Secondary | ICD-10-CM | POA: Diagnosis not present

## 2021-06-15 LAB — CBC WITH DIFFERENTIAL/PLATELET
Abs Immature Granulocytes: 0.05 10*3/uL (ref 0.00–0.07)
Basophils Absolute: 0 10*3/uL (ref 0.0–0.1)
Basophils Relative: 0 %
Eosinophils Absolute: 0 10*3/uL (ref 0.0–0.5)
Eosinophils Relative: 0 %
HCT: 29.1 % — ABNORMAL LOW (ref 36.0–46.0)
Hemoglobin: 8.7 g/dL — ABNORMAL LOW (ref 12.0–15.0)
Immature Granulocytes: 1 %
Lymphocytes Relative: 3 %
Lymphs Abs: 0.3 10*3/uL — ABNORMAL LOW (ref 0.7–4.0)
MCH: 27.7 pg (ref 26.0–34.0)
MCHC: 29.9 g/dL — ABNORMAL LOW (ref 30.0–36.0)
MCV: 92.7 fL (ref 80.0–100.0)
Monocytes Absolute: 0.4 10*3/uL (ref 0.1–1.0)
Monocytes Relative: 4 %
Neutro Abs: 9.5 10*3/uL — ABNORMAL HIGH (ref 1.7–7.7)
Neutrophils Relative %: 92 %
Platelets: 273 10*3/uL (ref 150–400)
RBC: 3.14 MIL/uL — ABNORMAL LOW (ref 3.87–5.11)
RDW: 18.2 % — ABNORMAL HIGH (ref 11.5–15.5)
WBC: 10.2 10*3/uL (ref 4.0–10.5)
nRBC: 0 % (ref 0.0–0.2)

## 2021-06-15 LAB — BASIC METABOLIC PANEL
Anion gap: 4 — ABNORMAL LOW (ref 5–15)
BUN: 33 mg/dL — ABNORMAL HIGH (ref 8–23)
CO2: 35 mmol/L — ABNORMAL HIGH (ref 22–32)
Calcium: 9.1 mg/dL (ref 8.9–10.3)
Chloride: 98 mmol/L (ref 98–111)
Creatinine, Ser: 0.88 mg/dL (ref 0.44–1.00)
GFR, Estimated: >60 mL/min (ref >60)
Glucose, Bld: 151 mg/dL — ABNORMAL HIGH (ref 70–99)
Potassium: 4.3 mmol/L (ref 3.5–5.1)
Sodium: 137 mmol/L (ref 135–145)

## 2021-06-15 LAB — BLOOD GAS, ARTERIAL
Acid-Base Excess: 10 mmol/L — ABNORMAL HIGH (ref 0.0–2.0)
Bicarbonate: 38.4 mmol/L — ABNORMAL HIGH (ref 20.0–28.0)
FIO2: 0.36
O2 Saturation: 95.8 %
Patient temperature: 37
pCO2 arterial: 78 mmHg (ref 32.0–48.0)
pH, Arterial: 7.3 — ABNORMAL LOW (ref 7.350–7.450)
pO2, Arterial: 88 mmHg (ref 83.0–108.0)

## 2021-06-15 LAB — LEGIONELLA PNEUMOPHILA SEROGP 1 UR AG: L. pneumophila Serogp 1 Ur Ag: NEGATIVE

## 2021-06-15 MED ORDER — METOPROLOL TARTRATE 5 MG/5ML IV SOLN
5.0000 mg | INTRAVENOUS | Status: DC | PRN
  Administered 2021-06-15 – 2021-06-16 (×3): 5 mg via INTRAVENOUS
  Filled 2021-06-15 (×3): qty 5

## 2021-06-15 MED ORDER — LEVALBUTEROL HCL 1.25 MG/0.5ML IN NEBU
1.2500 mg | INHALATION_SOLUTION | Freq: Three times a day (TID) | RESPIRATORY_TRACT | Status: DC
  Administered 2021-06-15 – 2021-06-18 (×8): 1.25 mg via RESPIRATORY_TRACT
  Filled 2021-06-15 (×15): qty 0.5

## 2021-06-15 NOTE — NC FL2 (Signed)
[place] LEVEL OF CARE SCREENING TOOL     IDENTIFICATION  Patient Name: Paula Torres Birthdate: 05-Mar-1951 Sex: female Admission Date (Current Location): 06/22/2021  [place] and [place] Number:  Engineering geologist and Address:  [facility], [address], [place], [place] [address]      Provider Number: [id number]  Attending Physician Name and Address:  Nolberto Hanlon, MD  Relative Name and Phone Number:  [name] (daughter) [phone]    Current Level of Care: Hospital Recommended Level of Care: [place] Prior Approval Number:    Date Approved/Denied:   PASRR Number:    Discharge Plan: SNF    Current Diagnoses: Patient Active Problem List   Diagnosis Date Noted   Pressure injury of skin 06/14/2021   HCAP (healthcare-associated pneumonia) 06/06/2021   PAF (paroxysmal atrial fibrillation) (HCC)    OSA (obstructive sleep apnea)    Sepsis ([place])    CAD (coronary artery disease)    Wound infection_left lower leg 05/28/2021   COPD (chronic obstructive pulmonary disease) ([place]) 05/28/2021   Chronic diastolic CHF (congestive heart failure) ([place]) 05/28/2021   Iron deficiency anemia 05/28/2021   Cellulitis of left lower leg 05/28/2021   Acute on chronic respiratory failure ([place]) 04/25/2021   Acute on chronic respiratory failure with hypoxia and hypercapnia ([place]) 05/11/2020   AF (paroxysmal atrial fibrillation) ([place]) 05/11/2020   Acute on chronic diastolic congestive heart failure (HCC)    Acute respiratory distress    Pulmonary hypertension ([place])    Restless leg syndrome    Depression    Atrial fibrillation with RVR ([place]) 01/21/2020   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[place]) 01/21/2020   Abnormal cardiovascular stress test    Demand ischemia (HCC)    Hyperlipidemia    COPD with acute exacerbation ([place]) 11/08/2019   Acute on chronic diastolic CHF (congestive heart failure) ([place]) 11/08/2019   Acute  respiratory failure with hypoxia and hypercapnia ([place]) 11/08/2019   Obesity, Class III, BMI 40-49.9 (morbid obesity) ([place]) 11/08/2019   Elevated troponin 11/08/2019    Orientation RESPIRATION BLADDER Height & Weight     Self, Place, Time  O2 (3L nasal cannula) Incontinent, External catheter Weight: (!) 311 lb 1.1 oz (141.1 kg) Height:  _0  (170.2 cm)  BEHAVIORAL SYMPTOMS/MOOD NEUROLOGICAL BOWEL NUTRITION STATUS      Incontinent Diet  AMBULATORY STATUS COMMUNICATION OF NEEDS Skin   Extensive Assist Verbally Other (Comment) (cellulitis left leg, excoriated breast, pressure injury left coccyx stage 2, open wound left knee)                       Personal Care Assistance Level of Assistance  Bathing, Feeding, Dressing, Total care Bathing Assistance: Limited assistance Feeding assistance: Independent Dressing Assistance: Limited assistance Total Care Assistance: Maximum assistance   Functional Limitations Info  Sight, Hearing, Speech Sight Info: Impaired Hearing Info: Adequate Speech Info: Adequate    SPECIAL CARE FACTORS FREQUENCY  PT (By licensed PT), OT (By licensed OT)     PT Frequency: min 3x weekly OT Frequency: min 3x weekly            Contractures Contractures Info: Not present    Additional Factors Info  Code Status, Allergies Code Status Info: full Allergies Info: codeine, sulfa antibiotics           Current Medications (06/15/2021):  This is the current hospital active medication list Current Facility-Administered Medications  Medication Dose Route Frequency Provider Last Rate Last  Admin   acetaminophen (TYLENOL) tablet 650 mg  650 mg Oral Q6H PRN Ivor Costa, MD       ALPRAZolam Duanne Moron) tablet 0.25 mg  0.25 mg Oral TID PRN Ralene Muskrat B, MD   0.25 mg at 06/15/21 0914   apixaban (ELIQUIS) tablet 5 mg  5 mg Oral BID Deal, Justice Britain, RPH   5 mg at 06/15/21 4114   atorvastatin (LIPITOR) tablet 20 mg  20 mg Oral QHS Ivor Costa, MD   20 mg at  06/14/21 2023   collagenase (SANTYL) ointment   Topical Daily Ivor Costa, MD   Given at 06/15/21 1326   dextromethorphan-guaiFENesin ([place] DM) 30-600 MG per 12 hr tablet 1 tablet  1 tablet Oral BID PRN Ivor Costa, MD       docusate sodium (COLACE) capsule 100 mg  100 mg Oral Daily PRN Ivor Costa, MD   100 mg at 06/14/21 0827   famotidine (PEPCID) tablet 20 mg  20 mg Oral QHS Ivor Costa, MD   20 mg at 06/14/21 2023   ferrous gluconate (FERGON) tablet 324 mg  324 mg Oral Weekly Ivor Costa, MD       folic acid (FOLVITE) tablet 1 mg  1 mg Oral Daily Ivor Costa, MD   1 mg at 06/15/21 6431   hydrALAZINE (APRESOLINE) injection 5 mg  5 mg Intravenous Q2H PRN Ivor Costa, MD       ipratropium (ATROVENT) nebulizer solution 0.5 mg  0.5 mg Nebulization TID Ivor Costa, MD   0.5 mg at 06/15/21 1339   levalbuterol (XOPENEX) nebulizer solution 0.63 mg  0.63 mg Nebulization Q6H PRN Ivor Costa, MD       levalbuterol Penne Lash) nebulizer solution 1.25 mg  1.25 mg Nebulization TID Nolberto Hanlon, MD       loratadine (CLARITIN) tablet 10 mg  10 mg Oral Daily Ivor Costa, MD   10 mg at 06/15/21 0915   methylPREDNISolone sodium succinate (SOLU-MEDROL) 125 mg/2 mL injection 60 mg  60 mg Intravenous Daily Nolberto Hanlon, MD   60 mg at 06/15/21 0914   montelukast (SINGULAIR) tablet 10 mg  10 mg Oral QHS Ivor Costa, MD   10 mg at 06/14/21 2022   ondansetron (ZOFRAN) injection 4 mg  4 mg Intravenous Q8H PRN Ivor Costa, MD       Treprostinil Diolamine ER TBCR 0.25 mg  0.25 mg Oral BID Ivor Costa, MD         Discharge Medications: Please see discharge summary for a list of discharge medications.  Relevant Imaging Results:  Relevant Lab Results:   Additional Information SSN: [id number]  Alberteen Sam, LCSW

## 2021-06-15 NOTE — Progress Notes (Signed)
LVM with daughter [name] to discuss dispo, pending call back at this time.   [place], Bertie

## 2021-06-15 NOTE — Progress Notes (Signed)
Palliative: Paula Torres is sitting up in the [place] chair in her room.  She does not make or keep eye contact.  Although she is able to tell me her name, and that we are in the hospital, she is unable to stay awake long enough to have a meaningful conversation.  It does not seem that she can make her basic needs known.  There is no family at bedside at this time.  Conference with bedside nursing staff who states that ABG showed increased CO2 and Paula Torres will be placed back on BiPAP.  Call to daughter, Paula Torres to update.  We talked about lethargy, ABG with CO2, Paula Torres being placed back on BiPAP.  We talked about time for outcomes.  Paula Torres continues to share that Paula Torres would not want long-term life support.  Conference with attending, bedside nursing staff, respiratory therapy, transition of care team related to patient condition, needs, goals of care.  Plan: At this point continue to treat the treatable.  Full scope/full code.  Time for outcomes  25 minutes Paula Axe, NP Palliative medicine team Team phone [phone] Greater than 50% of this time was spent counseling and coordinating care related to the above assessment and plan.

## 2021-06-15 NOTE — Progress Notes (Signed)
PROGRESS NOTE    Paula Torres  AJO:[id number] DOB: 07-02-1951 DOA: 06/18/2021 PCP: Rusty Aus, MD    Brief Narrative:  70 y.o. female with medical history significant of dCHF, hyperlipidemia, COPD on 3-4 L oxygen, GERD, depression, PAF (was on Eliquis which is on hold due to recent leg wound bleeding), pulmonary hypertension, OSA on BiPAP, CAD, iron deficiency anemia, CKD-3A, who presents with AMS and SOB.   Per her daughter at the bedside, patient normally needs to wear BiPAP for OSA, but patient is fully compliant with BiPAP.  When she does not use BiPAP, she often becomes confused due to elevated CO2 level.  Today patient was found to be confused again.  She has some shortness of breath, no worsening cough, no fever or chills.  Patient has some left-sided chest pain.  No active nausea ,vomiting, diarrhea noted. When I saw pt in ED, she is confused, knows her own name, not oriented to place and time. Of note, patient was recently hospitalized from 9/25 to 10/6 due to left lower leg wound infection with surrounding cellulitis.  Patient was discharged on cefadroxil.    Patient initially had oxygen desaturated to 88% on home level 3 L oxygen, BiPAP was started in ED.  First ABG with pH 7.24, CO2 88, O2 32.  The repeated VBG after started BiPAP showed pH 7.34, CO2 79, O2 36.  Patient remains on BiPAP the following morning.  Lethargic but does awaken and answers questions appropriately.  10/12 refused bipap last night 10/13- only used bipap for very short period last night. This am lethargic, doesn't open her eyes. Was given xanax this am.  ABG ordered, co2 elevated, asked RT to place pt on bipap  Assessment & Plan:   Principal Problem:   HCAP (healthcare-associated pneumonia) Active Problems:   COPD with acute exacerbation (HCC)   Obesity, Class III, BMI 40-49.9 (morbid obesity) ([place])   Pulmonary hypertension (HCC)   Acute on chronic respiratory failure with hypoxia and hypercapnia  (HCC)   Wound infection_left lower leg   Chronic diastolic CHF (congestive heart failure) (HCC)   Iron deficiency anemia   PAF (paroxysmal atrial fibrillation) (HCC)   OSA (obstructive sleep apnea)   Sepsis (HCC)   CAD (coronary artery disease)   Pressure injury of skin  Sepsis and acute on chronic respiratory failure with hypoxia and hypercapnia due to HCAP healthcare-associated pneumonia) and COPD with acute exacerbation ([place])  Patient meets criteria for sepsis with WBC 18.2, tachycardia with heart rate 93, tachypnea with RR 23.  Lactic acid is normal 1.0. Blood pressure soft, hemodynamically stable. 10/12 continue BiPAP Sepsis ruled out Procalcitonin negative Will DC IV antibiotics Discussed with patient importance of using her BiPAP especially at nighttime when she is asleep Continue Xanax prn Urine Legionella and strep antigen pending 10/13 abg with elevated co2 , likely why she is lethargic.  Have asked RT to place her BiPAP Continue bronchodilators Continue IV steroids will change to p.o. once more awake  Pulmonary hypertension (HCC) -Continue home  treprostinil    Obesity, Class III, BMI 40-49.9 (morbid obesity) ([place]) Complicates overall prognosis  Encouraged to lose weight      Chronic diastolic CHF (congestive heart failure) ([place])  2D echo on 01/31/2020 showed EF of 30 to 35%.   BNP 515, no pulm edema on chest x-ray, patient does not seem to have CHF exacerbation, but obviously at high risk for developing CHF exacerbation 10/13 continue to hold diuretics for now due to sepsis above  Monitor volume status      Wound infection_left lower leg -wound care consulted, plz see note     Iron deficiency anemia -Continue iron supplement   PAF (paroxysmal atrial fibrillation) ([place])  Patient was on Eliquis before, which is on hold due to recent left leg wound bleeding -Continue Eliquis -Hold Zebeta and Cardizem due to softer blood pressure   OSA (obstructive sleep  apnea) -BiPAP   CAD (coronary artery disease) -Lipitor   DVT prophylaxis: Eliquis Code Status: Full Family Communication: none at bedside  Disposition Plan: Status is: Inpatient  Remains inpatient appropriate because:Inpatient level of care appropriate due to severity of illness  Dispo: The patient is from: SNF              Anticipated d/c is to: SNF              Patient currently is not medically stable to d/c.   Difficult to place patient No       Level of care: Progressive Cardiac  Consultants:  None  Procedures:  None  Antimicrobials: Vancomycin Cefepime   Subjective: Lethargic, unable to open her eyes or answer my questions  Objective: Vitals:   06/15/21 0109 06/15/21 0400 06/15/21 0700 06/15/21 0829  BP: 127/65 124/70  136/66  Pulse: 68 67 66 78  Resp: _0 Temp: (!) 97.5 F (36.4 C) 97.9 F (36.6 C)  97.6 F (36.4 C)  TempSrc: Oral Oral    SpO2: 98% 94% 98% 91%  Weight:  (!) 141.1 kg    Height:        Intake/Output Summary (Last 24 hours) at 06/15/2021 0911 Last data filed at 06/15/2021 0500 Gross per 24 hour  Intake 1479.9 ml  Output 2000 ml  Net -520.1 ml   Filed Weights   06/06/2021 1154 06/14/21 0246 06/15/21 0400  Weight: (!) 140 kg (!) 140.2 kg (!) 141.1 kg    Examination: Lethargic Anteriorly clear with poor respiratory effort Regular S1-S2 no gallops Soft benign positive bowel sounds +edema unable to assess neuro exam   Data Reviewed: I have personally reviewed following labs and imaging studies  CBC: Recent Labs  Lab 06/16/2021 1140 06/13/21 0650 06/14/21 0508 06/15/21 0454  WBC 18.2* 12.9* 11.8* 10.2  NEUTROABS 16.5*  --  11.3* 9.5*  HGB 9.3* 9.3* 8.1* 8.7*  HCT 32.4* 30.5* 27.2* 29.1*  MCV 93.1 90.2 90.4 92.7  PLT 322 297 271 891   Basic Metabolic Panel: Recent Labs  Lab 06/05/2021 1140 06/13/21 0650 06/14/21 0508 06/15/21 0454  NA 139 136 136 137  K 4.3 4.7 4.4 4.3  CL 94* 95* 98 98  CO2 38*  33* 33* 35*  GLUCOSE 108* 161* 175* 151*  BUN 18 23 32* 33*  CREATININE 0.99 1.02* 1.06* 0.88  CALCIUM 8.8* 8.9 8.9 9.1   GFR: Estimated Creatinine Clearance: 89 mL/min (by C-G formula based on SCr of 0.88 mg/dL). Liver Function Tests: Recent Labs  Lab 06/07/2021 1140  AST 12*  ALT 13  ALKPHOS 88  BILITOT 1.1  PROT 6.1*  ALBUMIN 3.3*   No results for input(s): LIPASE, AMYLASE in the last 168 hours. No results for input(s): AMMONIA in the last 168 hours.  Coagulation Profile: Recent Labs  Lab 07/02/2021 1140  INR 1.0   Cardiac Enzymes: No results for input(s): CKTOTAL, CKMB, CKMBINDEX, TROPONINI in the last 168 hours. BNP (last 3 results) No results for input(s): PROBNP in the last 8760 hours. HbA1C: No results for  input(s): HGBA1C in the last 72 hours. CBG: Recent Labs  Lab 06/13/21 2207  GLUCAP 182*   Lipid Profile: No results for input(s): CHOL, HDL, LDLCALC, TRIG, CHOLHDL, LDLDIRECT in the last 72 hours. Thyroid Function Tests: No results for input(s): TSH, T4TOTAL, FREET4, T3FREE, THYROIDAB in the last 72 hours. Anemia Panel: No results for input(s): VITAMINB12, FOLATE, FERRITIN, TIBC, IRON, RETICCTPCT in the last 72 hours. Sepsis Labs: Recent Labs  Lab 06/09/2021 1140 06/13/21 0650  PROCALCITON <0.10  --   LATICACIDVEN 1.0 1.3    Recent Results (from the past 240 hour(s))  Resp Panel by RT-PCR (Flu A&B, Covid) Nasopharyngeal Swab     Status: None   Collection Time: 06/08/21  1:15 PM   Specimen: Nasopharyngeal Swab; Nasopharyngeal(NP) swabs in vial transport medium  Result Value Ref Range Status   SARS Coronavirus 2 by RT PCR NEGATIVE NEGATIVE Final    Comment: (NOTE) SARS-CoV-2 target nucleic acids are NOT DETECTED.  The SARS-CoV-2 RNA is generally detectable in upper respiratory specimens during the acute phase of infection. The lowest concentration of SARS-CoV-2 viral copies this assay can detect is 138 copies/mL. A negative result does not preclude  SARS-Cov-2 infection and should not be used as the sole basis for treatment or other patient management decisions. A negative result may occur with  improper specimen collection/handling, submission of specimen other than nasopharyngeal swab, presence of viral mutation(s) within the areas targeted by this assay, and inadequate number of viral copies(<138 copies/mL). A negative result must be combined with clinical observations, patient history, and epidemiological information. The expected result is Negative.  Fact Sheet for Patients:  EntrepreneurPulse.com.au  Fact Sheet for Healthcare Providers:  IncredibleEmployment.be  This test is no t yet approved or cleared by the [place] FDA and  has been authorized for detection and/or diagnosis of SARS-CoV-2 by FDA under an Emergency Use Authorization (EUA). This EUA will remain  in effect (meaning this test can be used) for the duration of the COVID-19 declaration under Section 564(b)(1) of the Act, 21 U.S.C.section 360bbb-3(b)(1), unless the authorization is terminated  or revoked sooner.       Influenza A by PCR NEGATIVE NEGATIVE Final   Influenza B by PCR NEGATIVE NEGATIVE Final    Comment: (NOTE) The Xpert Xpress SARS-CoV-2/FLU/RSV plus assay is intended as an aid in the diagnosis of influenza from Nasopharyngeal swab specimens and should not be used as a sole basis for treatment. Nasal washings and aspirates are unacceptable for Xpert Xpress SARS-CoV-2/FLU/RSV testing.  Fact Sheet for Patients: EntrepreneurPulse.com.au  Fact Sheet for Healthcare Providers: IncredibleEmployment.be  This test is not yet approved or cleared by the [place] FDA and has been authorized for detection and/or diagnosis of SARS-CoV-2 by FDA under an Emergency Use Authorization (EUA). This EUA will remain in effect (meaning this test can be used) for the duration of  the COVID-19 declaration under Section 564(b)(1) of the Act, 21 U.S.C. section 360bbb-3(b)(1), unless the authorization is terminated or revoked.  Performed at [facility], [address] [place], [place] [address]   Blood Culture (routine x 2)     Status: None (Preliminary result)   Collection Time: 06/03/2021 11:40 AM   Specimen: BLOOD  Result Value Ref Range Status   Specimen Description BLOOD RIGHT ANTECUBITAL  Final   Special Requests   Final    BOTTLES DRAWN AEROBIC AND ANAEROBIC Blood Culture adequate volume   Culture   Final    NO GROWTH 3 DAYS Performed at  [facility], [address] [place], [place] [address]    Report Status PENDING  Incomplete  Resp Panel by RT-PCR (Flu A&B, Covid) Nasopharyngeal Swab     Status: None   Collection Time: 06/04/2021  3:08 PM   Specimen: Nasopharyngeal Swab; Nasopharyngeal(NP) swabs in vial transport medium  Result Value Ref Range Status   SARS Coronavirus 2 by RT PCR NEGATIVE NEGATIVE Final    Comment: (NOTE) SARS-CoV-2 target nucleic acids are NOT DETECTED.  The SARS-CoV-2 RNA is generally detectable in upper respiratory specimens during the acute phase of infection. The lowest concentration of SARS-CoV-2 viral copies this assay can detect is 138 copies/mL. A negative result does not preclude SARS-Cov-2 infection and should not be used as the sole basis for treatment or other patient management decisions. A negative result may occur with  improper specimen collection/handling, submission of specimen other than nasopharyngeal swab, presence of viral mutation(s) within the areas targeted by this assay, and inadequate number of viral copies(<138 copies/mL). A negative result must be combined with clinical observations, patient history, and epidemiological information. The expected result is Negative.  Fact Sheet for Patients:  EntrepreneurPulse.com.au  Fact Sheet for Healthcare Providers:   IncredibleEmployment.be  This test is no t yet approved or cleared by the [place] FDA and  has been authorized for detection and/or diagnosis of SARS-CoV-2 by FDA under an Emergency Use Authorization (EUA). This EUA will remain  in effect (meaning this test can be used) for the duration of the COVID-19 declaration under Section 564(b)(1) of the Act, 21 U.S.C.section 360bbb-3(b)(1), unless the authorization is terminated  or revoked sooner.       Influenza A by PCR NEGATIVE NEGATIVE Final   Influenza B by PCR NEGATIVE NEGATIVE Final    Comment: (NOTE) The Xpert Xpress SARS-CoV-2/FLU/RSV plus assay is intended as an aid in the diagnosis of influenza from Nasopharyngeal swab specimens and should not be used as a sole basis for treatment. Nasal washings and aspirates are unacceptable for Xpert Xpress SARS-CoV-2/FLU/RSV testing.  Fact Sheet for Patients: EntrepreneurPulse.com.au  Fact Sheet for Healthcare Providers: IncredibleEmployment.be  This test is not yet approved or cleared by the [place] FDA and has been authorized for detection and/or diagnosis of SARS-CoV-2 by FDA under an Emergency Use Authorization (EUA). This EUA will remain in effect (meaning this test can be used) for the duration of the COVID-19 declaration under Section 564(b)(1) of the Act, 21 U.S.C. section 360bbb-3(b)(1), unless the authorization is terminated or revoked.  Performed at [facility], [address] [place], [place] [address]   Culture, blood (Routine X 2) w Reflex to ID Panel     Status: None (Preliminary result)   Collection Time: 06/13/21  6:50 AM   Specimen: BLOOD  Result Value Ref Range Status   Specimen Description BLOOD RIGHT HAND  Final   Special Requests   Final    BOTTLES DRAWN AEROBIC ONLY Blood Culture results may not be optimal due to an inadequate volume of blood received in culture bottles   Culture    Final    NO GROWTH 2 DAYS Performed at [facility], [address] [place], [place] [address]    Report Status PENDING  Incomplete  MRSA Next Gen by PCR, Nasal     Status: None   Collection Time: 06/14/21  9:00 AM   Specimen: Nasal Mucosa; Nasal Swab  Result Value Ref Range Status   MRSA by PCR Next Gen NOT DETECTED NOT DETECTED  Final    Comment: (NOTE) The GeneXpert MRSA Assay (FDA approved for NASAL specimens only), is one component of a comprehensive MRSA colonization surveillance program. It is not intended to diagnose MRSA infection nor to guide or monitor treatment for MRSA infections. Test performance is not FDA approved in patients less than 31 years old. Performed at [facility], [address] [place], [place] [address]          Radiology Studies: No results found.      Scheduled Meds:  apixaban  5 mg Oral BID   atorvastatin  20 mg Oral QHS   collagenase   Topical Daily   famotidine  20 mg Oral QHS   ferrous gluconate  324 mg Oral Weekly   folic acid  1 mg Oral Daily   ipratropium  0.5 mg Nebulization TID   loratadine  10 mg Oral Daily   methylPREDNISolone (SOLU-MEDROL) injection  60 mg Intravenous Daily   montelukast  10 mg Oral QHS   Treprostinil Diolamine ER  0.25 mg Oral BID   Continuous Infusions:     LOS: 3 days    Time spent: 35 minutes with more than 50% on [place], MD [facility]   If 7PM-7AM, please contact night-coverage  06/15/2021, 9:11 AM   Patient ID: Paula Torres, female   DOB: 1951-07-15, 70 y.o.   MRN: [id number]

## 2021-06-15 NOTE — Evaluation (Signed)
Occupational Therapy Evaluation Patient Details Name: Paula Torres MRN: [id number] DOB: 21-Mar-1951 Today's Date: 06/15/2021   History of Present Illness Paula Torres is a 70 y.o. female with medical history significant of dCHF, hyperlipidemia, COPD on 3-4 L oxygen, GERD, depression, PAF (was on Eliquis which is on hold due to recent leg wound bleeding), pulmonary hypertension, OSA on BiPAP, CAD, iron deficiency anemia, CKD-3A, who presents with AMS and SOB. Recently discharged from here and sent to skilled nursing.   Clinical Impression   Patient presenting with decreased Ind in self care, balance, functional mobility/transfers, and endurance. Patient reports being mod I for mobility with light assist from LB self care and IADL tasks about 1-2 months prior to this admission. Pt recently admitted to hospital with d/c to SNF for rehab prior to re-admission. Pt very slow to process information and only answering therapist 50% of the time when asked a question. Mod multimodal cuing for hand placement and techniqur for transfer. Pt requesting to get into recliner chair this session. Mod A of 2 for sit <>stand and min A of 2 for small shuffling steps to recliner chair. Pt on 4 L's during session with lowest desaturation to ~ 87%. Patient will benefit from acute OT to increase overall independence in the areas of ADLs, functional mobility, and safety awareness in order to safely discharge back to SNF for short term rehab program.     Recommendations for follow up therapy are one component of a multi-disciplinary discharge planning process, led by the attending physician.  Recommendations may be updated based on patient status, additional functional criteria and insurance authorization.   Follow Up Recommendations  SNF    Equipment Recommendations  Other (comment) (defer to next venue of care)       Precautions / Restrictions Precautions Precautions: Fall Precaution Comments: monitor O2 sats,  HR Restrictions Weight Bearing Restrictions: No Other Position/Activity Restrictions: BLE L>R wounds      Mobility Bed Mobility Overal bed mobility: Needs Assistance Bed Mobility: Supine to Sit     Supine to sit: Min assist     General bed mobility comments: min A to move LEs over EOB. Min assist to raise trunk    Transfers Overall transfer level: Needs assistance Equipment used: 2 person hand held assist Transfers: Sit to/from [facility] Sit to Stand: Min assist;+2 physical assistance;Mod assist Stand pivot transfers: Min assist;+2 physical assistance       General transfer comment: patient able to take a couple of shuffle steps to recliner.    Balance Overall balance assessment: Needs assistance Sitting-balance support: Feet supported Sitting balance-Leahy Scale: Good Sitting balance - Comments: Able to sit edge of bed with supervision   Standing balance support: Bilateral upper extremity supported;During functional activity Standing balance-Leahy Scale: Fair Standing balance comment: B UE support required                           ADL either performed or assessed with clinical judgement   ADL Overall ADL's : Needs assistance/impaired     Grooming: Wash/dry hands;Wash/dry face;Sitting;Set up               Lower Body Dressing: Maximal assistance;Sit to/from stand                       Vision Patient Visual Report: No change from baseline              Pertinent Vitals/Pain  Pain Assessment: No/denies pain     Hand Dominance Right   Extremity/Trunk Assessment Upper Extremity Assessment Upper Extremity Assessment: Generalized weakness   Lower Extremity Assessment Lower Extremity Assessment: Generalized weakness   Cervical / Trunk Assessment Cervical / Trunk Assessment: Kyphotic   Communication Communication Communication: No difficulties;HOH   Cognition Arousal/Alertness: Awake/alert Behavior During  Therapy: Flat affect Overall Cognitive Status: Difficult to assess                                 General Comments: Patient confused. Does not always answer questions. Disoriented to place, situation, time. Slow to process information              Home Living Family/patient expects to be discharged to:: Skilled nursing facility Living Arrangements: Spouse/significant other;Children Available Help at Discharge: Family;Available 24 hours/day Type of Home: House Home Access: Stairs to enter [employer] of Steps: 2 Entrance Stairs-Rails: Can reach both Home Layout: One level     Bathroom Shower/Tub: Occupational psychologist: Handicapped height     Home Equipment: Environmental consultant - 4 wheels;Bedside commode;Shower seat   Additional Comments: per prior admission ( 2 weeks ago), husband currently still at [facility], DTR home all the time, Rollator for ambulation at baseline      Prior Functioning/Environment Level of Independence: Independent with assistive device(s)        Comments: 1-2 months ago pt was mod I with self care using RW for mobility. Her daughter assisted with LB self care and IADL tasks. Multiple falls recently.        OT Problem List: Decreased strength;Pain;Decreased cognition;Decreased activity tolerance;Decreased knowledge of use of DME or AE;Obesity;Impaired balance (sitting and/or standing)      OT Treatment/Interventions: Self-care/ADL training;Therapeutic exercise;Therapeutic activities;Energy conservation;DME and/or AE instruction;Patient/family education;Balance training    OT Goals(Current goals can be found in the care plan section) Acute Rehab OT Goals Patient Stated Goal: none stated OT Goal Formulation: Patient unable to participate in goal setting Time For Goal Achievement: 06/29/21 Potential to Achieve Goals: Good ADL Goals Pt Will Perform Grooming: with modified independence;sitting Pt Will Perform Lower Body Dressing:  with min assist Pt Will Transfer to Toilet: with min guard assist Pt Will Perform Toileting - Clothing Manipulation and hygiene: with min guard assist  OT Frequency: Min 2X/week   Barriers to D/C:    none known at this time       Co-evaluation PT/OT/SLP Co-Evaluation/Treatment: Yes Reason for Co-Treatment: Complexity of the patient's impairments (multi-system involvement);For patient/therapist safety;To address functional/ADL transfers PT goals addressed during session: Mobility/safety with mobility OT goals addressed during session: ADL's and self-care      AM-PAC OT "6 Clicks" Daily Activity     Outcome Measure Help from another person eating meals?: None Help from another person taking care of personal grooming?: None Help from another person toileting, which includes using toliet, bedpan, or urinal?: A Lot Help from another person bathing (including washing, rinsing, drying)?: A Lot Help from another person to put on and taking off regular upper body clothing?: A Little Help from another person to put on and taking off regular lower body clothing?: A Lot 6 Click Score: 17   End of Session Equipment Utilized During Treatment: Rolling walker;Oxygen  Activity Tolerance: Patient tolerated treatment well Patient left: with call bell/phone within reach;in chair;with chair alarm set  OT Visit Diagnosis: Other abnormalities of gait and mobility (R26.89);Repeated falls (R29.6);Muscle  weakness (generalized) (M62.81)                Time: 2370-2301 OT Time Calculation (min): 19 min Charges:  OT General Charges $OT Visit: 1 Visit OT Evaluation $OT Eval Moderate Complexity: 1 [address], [place], OTR/L , CBIS ascom [phone]  06/15/21, 11:33 AM

## 2021-06-15 NOTE — TOC Initial Note (Addendum)
Transition of Care [facility]) - Initial/Assessment Note    Patient Details  Name: Paula Torres MRN: [id number] Date of Birth: Nov 26, 1950  Transition of Care [facility]) CM/SW Contact:    Alberteen Sam, LCSW Phone Number: 06/15/2021, 3:50 PM  Clinical Narrative:                  Update: [facility] is not accepting new patients at this time,  plan for patient to return to [facility] at time of discharge. Insurance Josem Kaufmann has been started. Daughter [name] updated, she does confirm patient already has a bipap at facility.    CSW spoke with patient's daughter [name], reports she is paying to hold patient's room at [facility] until tomorrow however would prefer patient to go to [facility] which is her wish, as patient's husband is there. Agreeable to outpatient palliative to follow at facility.   CSW has sent referral to [facility] pending bed offer.   Expected Discharge Plan: Skilled Nursing Facility Barriers to Discharge: Continued Medical Work up   Patient Goals and CMS Choice Patient states their goals for this hospitalization and ongoing recovery are:: to go home CMS Medicare.gov Compare Post Acute Care list provided to:: Patient Represenative (must comment) (daughter [name]) Choice offered to / list presented to : Adult Children  Expected Discharge Plan and Services Expected Discharge Plan: [place] Acute Care Choice: [place] arrangements for the past 2 months: [place]                                      Prior Living Arrangements/Services Living arrangements for the past 2 months: [place] Lives with:: Self, Facility Resident Patient language and need for interpreter reviewed:: Yes Do you feel safe going back to the place where you live?: No   needs short term rehab  Need for Family Participation in Patient Care: Yes (Comment) Care giver support system in  place?: Yes (comment)   Criminal Activity/Legal Involvement Pertinent to Current Situation/Hospitalization: No - Comment as needed  Activities of Daily Living Home Assistive Devices/Equipment: Walker (specify type) ADL Screening (condition at time of admission) Patient's cognitive ability adequate to safely complete daily activities?: No Is the patient deaf or have difficulty hearing?: No Does the patient have difficulty seeing, even when wearing glasses/contacts?: No Does the patient have difficulty concentrating, remembering, or making decisions?: Yes Patient able to express need for assistance with ADLs?: Yes Does the patient have difficulty dressing or bathing?: Yes Independently performs ADLs?: No Communication: Independent Dressing (OT): Needs assistance Is this a change from baseline?: Pre-admission baseline Grooming: Needs assistance Is this a change from baseline?: Pre-admission baseline Feeding: Independent Bathing: Needs assistance Is this a change from baseline?: Pre-admission baseline Toileting: Needs assistance Is this a change from baseline?: Pre-admission baseline In/Out Bed: Dependent Is this a change from baseline?: Pre-admission baseline Walks in Home: Dependent Is this a change from baseline?: Pre-admission baseline Does the patient have difficulty walking or climbing stairs?: Yes Weakness of Legs: Both Weakness of Arms/Hands: Both  Permission Sought/Granted      Share Information with NAME: [name]  Permission granted to share info w AGENCY: SNFs  Permission granted to share info w Relationship: dauughter     Emotional Assessment         Alcohol / Substance Use: Not Applicable Psych Involvement:  No (comment)  Admission diagnosis:  HCAP (healthcare-associated pneumonia) [J18.9] Acute respiratory failure with hypoxia and hypercapnia ([place]) [J96.01, J96.02] Patient Active Problem List   Diagnosis Date Noted   Pressure injury of skin 06/14/2021    HCAP (healthcare-associated pneumonia) 06/20/2021   PAF (paroxysmal atrial fibrillation) (HCC)    OSA (obstructive sleep apnea)    Sepsis ([place])    CAD (coronary artery disease)    Wound infection_left lower leg 05/28/2021   COPD (chronic obstructive pulmonary disease) ([place]) 05/28/2021   Chronic diastolic CHF (congestive heart failure) ([place]) 05/28/2021   Iron deficiency anemia 05/28/2021   Cellulitis of left lower leg 05/28/2021   Acute on chronic respiratory failure ([place]) 04/25/2021   Acute on chronic respiratory failure with hypoxia and hypercapnia (HCC) 05/11/2020   AF (paroxysmal atrial fibrillation) ([place]) 05/11/2020   Acute on chronic diastolic congestive heart failure (HCC)    Acute respiratory distress    Pulmonary hypertension ([place])    Restless leg syndrome    Depression    Atrial fibrillation with RVR ([place]) 01/21/2020   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[place]) 01/21/2020   Abnormal cardiovascular stress test    Demand ischemia (HCC)    Hyperlipidemia    COPD with acute exacerbation ([place]) 11/08/2019   Acute on chronic diastolic CHF (congestive heart failure) ([place]) 11/08/2019   Acute respiratory failure with hypoxia and hypercapnia ([place]) 11/08/2019   Obesity, Class III, BMI 40-49.9 (morbid obesity) ([place]) 11/08/2019   Elevated troponin 11/08/2019   PCP:  Rusty Aus, MD Pharmacy:   [facility] [place], [place] AT [address] [place] [address] Phone: [phone] Fax: [phone]     Social Determinants of Health (SDOH) Interventions    Readmission Risk Interventions No flowsheet data found.

## 2021-06-15 NOTE — Evaluation (Signed)
Physical Therapy Evaluation Patient Details Name: Paula Torres MRN: [id number] DOB: 04-05-1951 Today's Date: 06/15/2021  History of Present Illness  Paula Torres is a 70 y.o. female with medical history significant of dCHF, hyperlipidemia, COPD on 3-4 L oxygen, GERD, depression, PAF (was on Eliquis which is on hold due to recent leg wound bleeding), pulmonary hypertension, OSA on BiPAP, CAD, iron deficiency anemia, CKD-3A, who presents with AMS and SOB. Recently discharged from here and sent to skilled nursing.   Clinical Impression  Patient received in bed, she is alert, slow to respond. Does not answer questions consistently. Needs repeating of directions/questions to get a response. She requires min assist for supine to sit. Min A +2 for sit to stand and for pivoting to recliner. Patient's O2 saturations remained at 88% or greater throughout session. She will continue to benefit from skilled PT while here to improve strength and functional independence.         Recommendations for follow up therapy are one component of a multi-disciplinary discharge planning process, led by the attending physician.  Recommendations may be updated based on patient status, additional functional criteria and insurance authorization.  Follow Up Recommendations SNF;Supervision for mobility/OOB    Equipment Recommendations  None recommended by PT;Other (comment) (TBD)    Recommendations for Other Services       Precautions / Restrictions Precautions Precautions: Fall Precaution Comments: monitor O2 sats, HR Restrictions Weight Bearing Restrictions: No Other Position/Activity Restrictions: BLE L>R wounds      Mobility  Bed Mobility Overal bed mobility: Needs Assistance Bed Mobility: Supine to Sit     Supine to sit: Min assist     General bed mobility comments: min A to move LEs over EOB. Min assist to raise trunk    Transfers Overall transfer level: Needs assistance Equipment used: 2  person hand held assist Transfers: Sit to/from [facility] Sit to Stand: Min assist;+2 physical assistance Stand pivot transfers: Min assist;+2 physical assistance       General transfer comment: patient able to take a couple of shuffle steps to recliner.  Ambulation/Gait Ambulation/Gait assistance: Min assist;+2 physical assistance Gait Distance (Feet): 2 Feet Assistive device: 2 person hand held assist Gait Pattern/deviations: Step-to pattern;Shuffle;Decreased step length - right;Decreased step length - left Gait velocity: decreased      Stairs            Wheelchair Mobility    Modified Rankin (Stroke Patients Only)       Balance Overall balance assessment: Needs assistance Sitting-balance support: Feet supported Sitting balance-Leahy Scale: Good Sitting balance - Comments: Able to sit edge of bed with supervision   Standing balance support: Bilateral upper extremity supported;During functional activity Standing balance-Leahy Scale: Fair Standing balance comment: B UE support required                             Pertinent Vitals/Pain Pain Assessment: No/denies pain    Home Living Family/patient expects to be discharged to:: Skilled nursing facility Living Arrangements: Spouse/significant other;Children Available Help at Discharge: Family;Available 24 hours/day Type of Home: House Home Access: Stairs to enter Entrance Stairs-Rails: Can reach both Entrance Stairs-Number of Steps: 2 Home Layout: One level Home Equipment: Walker - 4 wheels;Bedside commode;Shower seat Additional Comments: per prior admission ( 2 weeks ago), husband currently still at [employer], DTR home all the time, Rollator for ambulation at baseline    Prior Function Level of Independence: Independent with assistive device(s)  Hand Dominance   Dominant Hand: Right    Extremity/Trunk Assessment   Upper Extremity Assessment Upper Extremity  Assessment: Defer to OT evaluation    Lower Extremity Assessment Lower Extremity Assessment: Generalized weakness    Cervical / Trunk Assessment Cervical / Trunk Assessment: Kyphotic  Communication   Communication: No difficulties;HOH  Cognition Arousal/Alertness: Awake/alert Behavior During Therapy: Flat affect Overall Cognitive Status: Difficult to assess                                 General Comments: Patient confused. Does not always answer questions. Disoriented to place, situation, time. Slow to respond      General Comments      Exercises     Assessment/Plan    PT Assessment Patient needs continued PT services  PT Problem List Decreased strength;Decreased mobility;Decreased safety awareness;Decreased range of motion;Decreased activity tolerance;Decreased balance;Cardiopulmonary status limiting activity;Decreased cognition;Decreased knowledge of precautions       PT Treatment Interventions DME instruction;Therapeutic exercise;Gait training;Balance training;Stair training;Neuromuscular re-education;Functional mobility training;Therapeutic activities;Patient/family education    PT Goals (Current goals can be found in the Care Plan section)  Acute Rehab PT Goals Patient Stated Goal: none stated PT Goal Formulation: Patient unable to participate in goal setting Time For Goal Achievement: 06/29/21    Frequency Min 2X/week   Barriers to discharge        Co-evaluation               AM-PAC PT "6 Clicks" Mobility  Outcome Measure Help needed turning from your back to your side while in a flat bed without using bedrails?: A Little Help needed moving from lying on your back to sitting on the side of a flat bed without using bedrails?: A Little Help needed moving to and from a bed to a chair (including a wheelchair)?: A Little Help needed standing up from a chair using your arms (e.g., wheelchair or bedside chair)?: A Little Help needed to walk in  hospital room?: A Lot Help needed climbing 3-5 steps with a railing? : Total 6 Click Score: 15    End of Session Equipment Utilized During Treatment: Oxygen Activity Tolerance: Patient limited by lethargy Patient left: in chair;with call bell/phone within reach;with chair alarm set Nurse Communication: Mobility status PT Visit Diagnosis: Unsteadiness on feet (R26.81);Other abnormalities of gait and mobility (R26.89);Repeated falls (R29.6);Muscle weakness (generalized) (M62.81);Difficulty in walking, not elsewhere classified (R26.2)    Time: 1586-8257 PT Time Calculation (min) (ACUTE ONLY): 21 min   Charges:   PT Evaluation $PT Eval Moderate Complexity: 1 Mod          Domnic Vantol, PT, GCS 06/15/21,10:43 AM

## 2021-06-15 NOTE — Progress Notes (Signed)
This Rt arrived to room , pt removed breathing treatment and O2 cannula. O2 sats in 50's NT in room and replaced breathing treatment on pt. NRB applied for stas >92. Pt placed back on 4Lnc

## 2021-06-16 DIAGNOSIS — J189 Pneumonia, unspecified organism: Secondary | ICD-10-CM | POA: Diagnosis not present

## 2021-06-16 DIAGNOSIS — J9601 Acute respiratory failure with hypoxia: Secondary | ICD-10-CM | POA: Diagnosis not present

## 2021-06-16 DIAGNOSIS — Z7189 Other specified counseling: Secondary | ICD-10-CM | POA: Diagnosis not present

## 2021-06-16 DIAGNOSIS — J9621 Acute and chronic respiratory failure with hypoxia: Secondary | ICD-10-CM | POA: Diagnosis not present

## 2021-06-16 DIAGNOSIS — Z515 Encounter for palliative care: Secondary | ICD-10-CM | POA: Diagnosis not present

## 2021-06-16 LAB — CBC WITH DIFFERENTIAL/PLATELET
Abs Immature Granulocytes: 0.08 10*3/uL — ABNORMAL HIGH (ref 0.00–0.07)
Basophils Absolute: 0 10*3/uL (ref 0.0–0.1)
Basophils Relative: 0 %
Eosinophils Absolute: 0 10*3/uL (ref 0.0–0.5)
Eosinophils Relative: 0 %
HCT: 29 % — ABNORMAL LOW (ref 36.0–46.0)
Hemoglobin: 8.6 g/dL — ABNORMAL LOW (ref 12.0–15.0)
Immature Granulocytes: 1 %
Lymphocytes Relative: 6 %
Lymphs Abs: 0.6 10*3/uL — ABNORMAL LOW (ref 0.7–4.0)
MCH: 26.4 pg (ref 26.0–34.0)
MCHC: 29.7 g/dL — ABNORMAL LOW (ref 30.0–36.0)
MCV: 89 fL (ref 80.0–100.0)
Monocytes Absolute: 0.9 10*3/uL (ref 0.1–1.0)
Monocytes Relative: 8 %
Neutro Abs: 9.4 10*3/uL — ABNORMAL HIGH (ref 1.7–7.7)
Neutrophils Relative %: 85 %
Platelets: 235 10*3/uL (ref 150–400)
RBC: 3.26 MIL/uL — ABNORMAL LOW (ref 3.87–5.11)
RDW: 18.1 % — ABNORMAL HIGH (ref 11.5–15.5)
WBC: 11 10*3/uL — ABNORMAL HIGH (ref 4.0–10.5)
nRBC: 0 % (ref 0.0–0.2)

## 2021-06-16 LAB — BASIC METABOLIC PANEL
Anion gap: 8 (ref 5–15)
BUN: 34 mg/dL — ABNORMAL HIGH (ref 8–23)
CO2: 34 mmol/L — ABNORMAL HIGH (ref 22–32)
Calcium: 9.1 mg/dL (ref 8.9–10.3)
Chloride: 98 mmol/L (ref 98–111)
Creatinine, Ser: 0.92 mg/dL (ref 0.44–1.00)
GFR, Estimated: >60 mL/min (ref >60)
Glucose, Bld: 164 mg/dL — ABNORMAL HIGH (ref 70–99)
Potassium: 4.7 mmol/L (ref 3.5–5.1)
Sodium: 140 mmol/L (ref 135–145)

## 2021-06-16 MED ORDER — CLONAZEPAM 0.5 MG PO TABS: 0.5000 mg | ORAL_TABLET | Freq: Three times a day (TID) | ORAL | Status: DC | PRN

## 2021-06-16 MED ORDER — MIDODRINE HCL 5 MG PO TABS
5.0000 mg | ORAL_TABLET | Freq: Three times a day (TID) | ORAL | Status: DC
  Administered 2021-06-16 – 2021-06-17 (×2): 5 mg via ORAL
  Filled 2021-06-16 (×2): qty 1

## 2021-06-16 MED ORDER — DILTIAZEM HCL 30 MG PO TABS
30.0000 mg | ORAL_TABLET | Freq: Four times a day (QID) | ORAL | Status: DC
  Administered 2021-06-16: 30 mg via ORAL
  Filled 2021-06-16: qty 1

## 2021-06-16 MED ORDER — DILTIAZEM HCL 30 MG PO TABS
60.0000 mg | ORAL_TABLET | Freq: Four times a day (QID) | ORAL | Status: DC
  Administered 2021-06-16 – 2021-06-18 (×8): 60 mg via ORAL
  Filled 2021-06-16 (×8): qty 2

## 2021-06-16 MED ORDER — METHYLPREDNISOLONE SODIUM SUCC 40 MG IJ SOLR
40.0000 mg | Freq: Every day | INTRAMUSCULAR | Status: DC
  Administered 2021-06-17: 40 mg via INTRAVENOUS
  Filled 2021-06-16: qty 1

## 2021-06-16 MED ORDER — BISOPROLOL FUMARATE 5 MG PO TABS
5.0000 mg | ORAL_TABLET | Freq: Every day | ORAL | Status: DC
  Administered 2021-06-16 – 2021-06-17 (×2): 5 mg via ORAL
  Filled 2021-06-16 (×2): qty 1

## 2021-06-16 NOTE — Plan of Care (Signed)

## 2021-06-16 NOTE — Progress Notes (Signed)
Palliative: Paula Torres is lying quietly in bed.  She does not greet me or interact with me in any meaningful way.  She will occasionally open her eyes, but not make eye contact.  She is able to accurately answer orientation questions, but clearly is disengaged.  I am not sure that she can make her needs known unless I asked.  Her son, Paula Torres, is at bedside.  Paula Torres and I have a detailed discussion related to Paula Torres's chronic health concerns including, but not limited to, respiratory failure, COPD, heart failure, CKD, obesity and poor mobility.  We also talked in detail about Paula Torres's acute health concerns including, but not limited to use of BiPAP, fluid overload, the treatment plan, disposition options.  I share with Paula Torres that her daughter Paula Torres is unable to fully meet her needs at home.  We talked about home health services versus outpatient palliative services versus at home "treat the treatable" hospice services.  I share that all services come, do their task, and leave.    I share a diagram of the chronic illness pathway, what is normal and expected.  We talked about the differences between palliative care and hospice care.  During our discussion, Paula Torres continuously states that she wants to go home.  I share that if she is willing to go with hospice care, she can certainly go home.  We talked about trying to use the BiPAP to reduce her CO2 levels, trying short-term rehab for strength and balance.  At this point, Paula Torres is not able to have meaningful discussions related to her goals of care.  Her son, Paula Torres, seems knowledgeable about her health concerns and the treatment plan, also her ability to recover.  Paula Torres shares that he will work with Paula Torres to encourage her for use of BiPAP, encouraging at least 12 hours/day total for the next few days.  He request follow-up palliative meeting next week Tuesday, October 18.  Conference with attending, bedside  nursing staff, respiratory therapy, transition of care team related to patient condition, needs, goals of care, disposition.  Plan:    At this point continue to treat the treatable.  Paula Torres has stated that she would like a trial of CPR/intubation, but would not want long-term life support.   Outpatient palliative services to follow.  65 minutes, extended time Quinn Axe, NP Palliative medicine team Team phone [phone] Greater than 50% of this time was spent counseling and coordinating care related to the above assessment and plan.

## 2021-06-16 NOTE — Progress Notes (Signed)
06/15/21 2308  Assess: MEWS Score  Temp 98.1 F (36.7 C)  BP 125/75  Pulse Rate (!) 129  Resp 18  SpO2 91 %  O2 Device Nasal Cannula  O2 Flow Rate (L/min) 4 L/min  Assess: MEWS Score  MEWS Temp 0  MEWS Systolic 0  MEWS Pulse 2  MEWS RR 0  MEWS LOC 0  MEWS Score 2  MEWS Score Color Yellow  Assess: if the MEWS score is Yellow or Red  Were vital signs taken at a resting state? Yes  Focused Assessment No change from prior assessment  Does the patient meet 2 or more of the SIRS criteria? No  MEWS guidelines implemented *See Row Information* No, vital signs rechecked  Treat  MEWS Interventions Administered prn meds/treatments  Pain Scale 0-10  Pain Score 0  Multiple Pain Sites No  Breathing 0  Negative Vocalization 0  Facial Expression 0  Consolability 0  Complains of Anxiety  Interventions Medication (see MAR)  Neuro symptoms relieved by Anti-anxiety medication  Patients response to intervention Effective  Take Vital Signs  Increase Vital Sign Frequency  Yellow: Q 2hr X 2 then Q 4hr X 2, if remains yellow, continue Q 4hrs  Escalate  MEWS: Escalate Yellow: discuss with charge nurse/RN and consider discussing with provider and RRT  Notify: Charge Nurse/RN  Name of Charge Nurse/RN Notified [place], RN  Date Charge Nurse/RN Notified 06/15/21  Time Charge Nurse/RN Notified 2315  Notify: Provider  Provider Name/Title Sharion Settler  Date Provider Notified 06/15/21  Time Provider Notified 2330  Notification Type  (secure chat)  Notification Reason Other (Comment) (high heart rate 138 sustained)  Provider response See new orders (Metoprolol q 4 hours PRN)  Date of Provider Response 06/15/21  Time of Provider Response 2335  Document  Patient Outcome Stabilized after interventions  Progress note created (see row info) Yes  Assess: SIRS CRITERIA  SIRS Temperature  0  SIRS Pulse 1  SIRS Respirations  0  SIRS WBC 0  SIRS Score Sum  1

## 2021-06-16 NOTE — Consult Note (Signed)
Pulmonary Medicine          Date: 06/16/2021,   MRN# [id number] Paula Torres 04-03-1951     AdmissionWeight: (!) 140 kg                 CurrentWeight: (!) 142.3 kg   Referring physician: Dr. Francine Graven   CHIEF COMPLAINT:   Acute on chronic hypercapnic and hypoxemic respiratory failure   HISTORY OF PRESENT ILLNESS   Is a 70 year old female with a history of diastolic CHF as well as COPD and morbid obesity with pickwickian syndrome and obesity hypoventilation with chronic hypercapnia.  She also has a history of pulmonary hypertension, obstructive sleep apnea atrial fibrillation and essential hypertension.  She came into the hospital with complaints of acute onset dyspnea with hypoxemia.  Daughter was able to respond and was able to increase oxygen therapy as well as call EMS due to respiratory failure.  She was placed on BiPAP during my evaluation.   At baseline patient uses 4 L/min nasal cannula.  Pulmonary consultation for further evaluation and management of complex comorbid pulmonary patient.  I spoke with son Ilsa Bonello and daughter at bedside.  We discussed importance of BIPAP and or high risk of cardiac arrest due to respiratory failure.     PAST MEDICAL HISTORY   Past Medical History:  Diagnosis Date   (HFpEF) heart failure with preserved ejection fraction (HCC)    Chronic respiratory failure with hypoxia (HCC)    COPD (chronic obstructive pulmonary disease) (HCC)    Coronary artery disease, non-occlusive    Hypertension    OSA (obstructive sleep apnea)    PAF (paroxysmal atrial fibrillation) ([facility])    Pulmonary hypertension (HCC)      SURGICAL HISTORY   Past Surgical History:  Procedure Laterality Date   NO PAST SURGERIES     RIGHT/LEFT HEART CATH AND CORONARY ANGIOGRAPHY Left 12/21/2019   Procedure: RIGHT/LEFT HEART CATH AND CORONARY ANGIOGRAPHY;  Surgeon: Wellington Hampshire, MD;  Location: [facility];  Service: Cardiovascular;   Laterality: Left;     FAMILY HISTORY   Family History  Problem Relation Age of Onset   Hypertension Other      SOCIAL HISTORY   Social History   Tobacco Use   Smoking status: Former    Types: Cigarettes    Quit date: 06/03/2017    Years since quitting: 4.0   Smokeless tobacco: Never  Vaping Use   Vaping Use: Never used  Substance Use Topics   Alcohol use: Not Currently   Drug use: Never     MEDICATIONS    Home Medication:  REM   Current Medication:  Current Facility-Administered Medications:    acetaminophen (TYLENOL) tablet 650 mg, 650 mg, Oral, Q6H PRN, Ivor Costa, MD   ALPRAZolam Duanne Moron) tablet 0.25 mg, 0.25 mg, Oral, TID PRN, Priscella Mann, Sudheer B, MD, 0.25 mg at 06/15/21 2052   apixaban (ELIQUIS) tablet 5 mg, 5 mg, Oral, BID, Deal, Justice Britain, RPH, 5 mg at 06/16/21 0918   atorvastatin (LIPITOR) tablet 20 mg, 20 mg, Oral, QHS, Niu, Soledad Gerlach, MD, 20 mg at 06/15/21 2052   bisoprolol (ZEBETA) tablet 5 mg, 5 mg, Oral, Daily, Nolberto Hanlon, MD   collagenase (SANTYL) ointment, , Topical, Daily, Ivor Costa, MD, Given at 06/16/21 0917   dextromethorphan-guaiFENesin (Noma DM) 30-600 MG per 12 hr tablet 1 tablet, 1 tablet, Oral, BID PRN, Ivor Costa, MD   diltiazem (CARDIZEM) tablet 30 mg, 30 mg, Oral, Q6H, Amery, Sahar,  MD, 30 mg at 06/16/21 1107   docusate sodium (COLACE) capsule 100 mg, 100 mg, Oral, Daily PRN, Ivor Costa, MD, 100 mg at 06/14/21 0827   famotidine (PEPCID) tablet 20 mg, 20 mg, Oral, QHS, Ivor Costa, MD, 20 mg at 06/15/21 2053   ferrous gluconate (FERGON) tablet 324 mg, 324 mg, Oral, Weekly, Ivor Costa, MD   folic acid (FOLVITE) tablet 1 mg, 1 mg, Oral, Daily, Ivor Costa, MD, 1 mg at 06/16/21 5573   hydrALAZINE (APRESOLINE) injection 5 mg, 5 mg, Intravenous, Q2H PRN, Ivor Costa, MD   ipratropium (ATROVENT) nebulizer solution 0.5 mg, 0.5 mg, Nebulization, TID, Ivor Costa, MD, 0.5 mg at 06/16/21 1325   levalbuterol (XOPENEX) nebulizer solution 0.63 mg, 0.63  mg, Nebulization, Q6H PRN, Ivor Costa, MD   levalbuterol Penne Lash) nebulizer solution 1.25 mg, 1.25 mg, Nebulization, TID, Nolberto Hanlon, MD, 1.25 mg at 06/16/21 1325   loratadine (CLARITIN) tablet 10 mg, 10 mg, Oral, Daily, Ivor Costa, MD, 10 mg at 06/16/21 2202   methylPREDNISolone sodium succinate (SOLU-MEDROL) 125 mg/2 mL injection 60 mg, 60 mg, Intravenous, Daily, Nolberto Hanlon, MD, 60 mg at 06/16/21 0918   metoprolol tartrate (LOPRESSOR) injection 5 mg, 5 mg, Intravenous, Q4H PRN, Sharion Settler, NP, 5 mg at 06/16/21 0951   montelukast (SINGULAIR) tablet 10 mg, 10 mg, Oral, QHS, Ivor Costa, MD, 10 mg at 06/15/21 2054   ondansetron (ZOFRAN) injection 4 mg, 4 mg, Intravenous, Q8H PRN, Ivor Costa, MD   Treprostinil Diolamine ER TBCR 0.25 mg, 0.25 mg, Oral, BID, Ivor Costa, MD    ALLERGIES   Codeine and Sulfa antibiotics     REVIEW OF SYSTEMS    Review of Systems:  Gen:  Denies  fever, sweats, chills weigh loss  HEENT: Denies blurred vision, double vision, ear pain, eye pain, hearing loss, nose bleeds, sore throat Cardiac:  No dizziness, chest pain or heaviness, chest tightness,edema Resp:   Denies cough or sputum porduction, shortness of breath,wheezing, hemoptysis,  Gi: Denies swallowing difficulty, stomach pain, nausea or vomiting, diarrhea, constipation, bowel incontinence Gu:  Denies bladder incontinence, burning urine Ext:   Denies Joint pain, stiffness or swelling Skin: Denies  skin rash, easy bruising or bleeding or hives Endoc:  Denies polyuria, polydipsia , polyphagia or weight change Psych:   Denies depression, insomnia or hallucinations   Other:  All other systems negative   VS: BP (!) 125/55   Pulse (!) 120   Temp 98.3 F (36.8 C) (Oral)   Resp (!) 25   Ht _0  (1.702 m)   Wt (!) 142.3 kg   SpO2 98%   BMI 49.13 kg/m      PHYSICAL EXAM    GENERAL:NAD, no fevers, chills, no weakness no fatigue HEAD: Normocephalic, atraumatic.  EYES: Pupils equal,  round, reactive to light. Extraocular muscles intact. No scleral icterus.  MOUTH: Moist mucosal membrane. Dentition intact. No abscess noted.  EAR, NOSE, THROAT: Clear without exudates. No external lesions.  NECK: Supple. No thyromegaly. No nodules. No JVD.  PULMONARY:  CARDIOVASCULAR: S1 and S2. Regular rate and rhythm. No murmurs, rubs, or gallops. No edema. Pedal pulses 2+ bilaterally.  GASTROINTESTINAL: Soft, nontender, nondistended. No masses. Positive bowel sounds. No hepatosplenomegaly.  MUSCULOSKELETAL: No swelling, clubbing, or edema. Range of motion full in all extremities.  NEUROLOGIC: Cranial nerves II through XII are intact. No gross focal neurological deficits. Sensation intact. Reflexes intact.  SKIN: No ulceration, lesions, rashes, or cyanosis. Skin warm and dry. Turgor intact.  PSYCHIATRIC: Mood, affect  within normal limits. The patient is awake, alert and oriented x 3. Insight, judgment intact.       IMAGING    CT HEAD WO CONTRAST (5MM)  Result Date: 06/15/2021 CLINICAL DATA:  Mental status change, unknown cause. EXAM: CT HEAD WITHOUT CONTRAST TECHNIQUE: Contiguous axial images were obtained from the base of the skull through the vertex without intravenous contrast. COMPARISON:  Prior head CT examinations 05/28/2021 and earlier. FINDINGS: Brain: Mild generalized cerebral atrophy. Mild to moderate patchy and ill-defined hypoattenuation within the cerebral white matter, nonspecific but compatible with chronic small vessel ischemic disease. There is no acute intracranial hemorrhage. No demarcated cortical infarct. No extra-axial fluid collection. No evidence of an intracranial mass. No midline shift. Partially empty sella turcica. Vascular: No hyperdense vessel. Atherosclerotic calcifications. Skull: Normal. Negative for fracture or focal lesion. Sinuses/Orbits: Visualized orbits show no acute finding. Trace bilateral ethmoid sinus mucosal thickening. Small mucous retention cyst  within a right ethmoid air cell. IMPRESSION: No evidence of acute intracranial abnormality. Mild-to-moderate chronic small vessel ischemic changes within the cerebral white matter. Mild generalized cerebral atrophy. Mild ethmoid sinus disease, as described. Electronically Signed   By: Kellie Simmering D.O.   On: 06/13/2021 16:01   CT HEAD WO CONTRAST (5MM)  Result Date: 05/28/2021 CLINICAL DATA:  Delirium. EXAM: CT HEAD WITHOUT CONTRAST TECHNIQUE: Contiguous axial images were obtained from the base of the skull through the vertex without intravenous contrast. COMPARISON:  CT head 04/25/2021 BRAIN: BRAIN Patchy and confluent areas of decreased attenuation are noted throughout the deep and periventricular white matter of the cerebral hemispheres bilaterally, compatible with chronic microvascular ischemic disease. No evidence of large-territorial acute infarction. No parenchymal hemorrhage. No mass lesion. No extra-axial collection. No mass effect or midline shift. No hydrocephalus. Basilar cisterns are patent. Vascular: No hyperdense vessel. Atherosclerotic calcifications are present within the cavernous internal carotid arteries. Skull: No acute fracture or focal lesion. Sinuses/Orbits: Paranasal sinuses and mastoid air cells are clear. The orbits are unremarkable. Other: None. IMPRESSION: No acute intracranial abnormality. Electronically Signed   By: Iven Finn M.D.   On: 05/28/2021 18:00   US Venous Img Lower Unilateral Left (DVT)  Result Date: 05/28/2021 CLINICAL DATA:  Recent fall, cellulitis, pain and swelling EXAM: LEFT LOWER EXTREMITY VENOUS DOPPLER ULTRASOUND TECHNIQUE: Gray-scale sonography with compression, as well as color and duplex ultrasound, were performed to evaluate the deep venous system(s) from the level of the common femoral vein through the popliteal and proximal calf veins. COMPARISON:  None. FINDINGS: VENOUS Normal compressibility of the common femoral, superficial femoral, and  popliteal veins, as well as the visualized calf veins. Visualized portions of profunda femoral vein and great saphenous vein unremarkable. No filling defects to suggest DVT on grayscale or color Doppler imaging. Doppler waveforms show normal direction of venous flow, normal respiratory plasticity and response to augmentation. Limited views of the contralateral common femoral vein are unremarkable. OTHER None. Limitations: none IMPRESSION: Negative examination for deep venous thrombosis in the left lower extremity. Electronically Signed   By: Eddie Candle M.D.   On: 05/28/2021 14:37   DG Chest Port 1 View  Result Date: 06/06/2021 CLINICAL DATA:  Questionable sepsis - evaluate for abnormality new consolidation in the right lower EXAM: PORTABLE CHEST 1 VIEW COMPARISON:  05/28/2021. FINDINGS: New consolidation in the right lower lobe. Chronic interstitial prominence, slightly increased in conspicuity on the right. No visible pleural effusions or pneumothorax. Limited evaluation left lung apex to overlapping head/neck. Similar enlargement the cardiac silhouette. Calcific  atherosclerosis aorta. Chronic right rib fractures. Chronic left proximal humerus fracture. IMPRESSION: 1. New consolidation in the right lower lobe, concerning for pneumonia. 2. Chronic interstitial prominence, slightly increased in conspicuity on the right. This could represent chronic change, but infection or mild asymmetric edema are considerations. Electronically Signed   By: Margaretha Sheffield M.D.   On: 06/11/2021 12:40   DG Chest Port 1 View  Result Date: 05/28/2021 CLINICAL DATA:  Multiple falls, generalized weakness for about a week. Bilateral lower extremity edema. Is on oxygen at home. Hx of CHF, COPD. EXAM: PORTABLE CHEST 1 VIEW COMPARISON:  04/26/2021 FINDINGS: Mild enlargement of the cardiac silhouette, stable. No mediastinal or hilar masses. Prominent vascular markings similar to the prior study. Linear opacity at the left lung  base consistent with atelectasis or scarring. No lung consolidation. No convincing edema. No visualized pleural effusion and no evidence of a pneumothorax. 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 IMPRESSION: 1. No acute cardiopulmonary disease. Electronically Signed   By: Lajean Manes M.D.   On: 05/28/2021 12:22   DG Knee Complete 4 Views Left  Result Date: 05/25/2021 CLINICAL DATA:  Fall.  Pain EXAM: LEFT KNEE - COMPLETE 4+ VIEW COMPARISON:  None. FINDINGS: There is diffuse soft tissue edema involving the anterior and lateral soft tissues. No underlying fracture or dislocation. Tiny joint effusion within the suprapatellar joint space. Patellofemoral and medial compartment narrowing. IMPRESSION: 1. No acute bone abnormality. 2. Soft tissue edema within the anterior and lateral soft tissues which may reflect contusion. 3. Tiny joint effusion. Electronically Signed   By: Kerby Moors M.D.   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  Total face to face encounter time for this patient visit was 45mn. >50% of the time was  spent in counseling and coordination of care.   Patient/Family are satisfied with care  plan and all questions have been answered.  This document was prepared using Dragon voice recognition software and may include unintentional dictation errors.     FOttie Glazier M.D.  Division of [facility]

## 2021-06-16 NOTE — Progress Notes (Signed)
PROGRESS NOTE    Paula Torres  HUT:[id number] DOB: 1950-10-30 DOA: 06/06/2021 PCP: Rusty Aus, MD    Brief Narrative:  70 y.o. female with medical history significant of dCHF, hyperlipidemia, COPD on 3-4 L oxygen, GERD, depression, PAF (was on Eliquis which is on hold due to recent leg wound bleeding), pulmonary hypertension, OSA on BiPAP, CAD, iron deficiency anemia, CKD-3A, who presents with AMS and SOB.   Per her daughter at the bedside, patient normally needs to wear BiPAP for OSA, but patient is fully compliant with BiPAP.  When she does not use BiPAP, she often becomes confused due to elevated CO2 level.  Today patient was found to be confused again.  She has some shortness of breath, no worsening cough, no fever or chills.  Patient has some left-sided chest pain.  No active nausea ,vomiting, diarrhea noted. When I saw pt in ED, she is confused, knows her own name, not oriented to place and time. Of note, patient was recently hospitalized from 9/25 to 10/6 due to left lower leg wound infection with surrounding cellulitis.  Patient was discharged on cefadroxil.    Patient initially had oxygen desaturated to 88% on home level 3 L oxygen, BiPAP was started in ED.  First ABG with pH 7.24, CO2 88, O2 32.  The repeated VBG after started BiPAP showed pH 7.34, CO2 79, O2 36.  Patient remains on BiPAP the following morning.  Lethargic but does awaken and answers questions appropriately.  10/12 refused bipap last night 10/13- only used bipap for very short period last night. This am lethargic, doesn't open her eyes. Was given xanax this am.  ABG ordered, co2 elevated, asked RT to place pt on bipap 10/14 awake today as pt used bipap last night per nursing. Had long time with pt's son who is present about her long term goals, especially if she is not going to be complaint with bipap. Discussed asking pccm who also knows her well to have discussion about this Tele afib rvr HR  130's.  Assessment & Plan:   Principal Problem:   HCAP (healthcare-associated pneumonia) Active Problems:   COPD with acute exacerbation (HCC)   Obesity, Class III, BMI 40-49.9 (morbid obesity) ([place])   Pulmonary hypertension (HCC)   Acute on chronic respiratory failure with hypoxia and hypercapnia (HCC)   Wound infection_left lower leg   Chronic diastolic CHF (congestive heart failure) (HCC)   Iron deficiency anemia   PAF (paroxysmal atrial fibrillation) (HCC)   OSA (obstructive sleep apnea)   Sepsis (HCC)   CAD (coronary artery disease)   Pressure injury of skin  Sepsis and acute on chronic respiratory failure with hypoxia and hypercapnia due to HCAP healthcare-associated pneumonia) and COPD with acute exacerbation ([place])  Patient meets criteria for sepsis with WBC 18.2, tachycardia with heart rate 93, tachypnea with RR 23.  Lactic acid is normal 1.0. Blood pressure soft, hemodynamically stable. 10/12 continue BiPAP Sepsis ruled out Procalcitonin negative Will DC IV antibiotics Discussed with patient importance of using her BiPAP especially at nighttime when she is asleep Continue Xanax prn Urine Legionella and strep antigen pending 10/13 abg with elevated co2 , likely why she is lethargic.  Have asked RT to place her BiPAP 10/14 continue bronchodilators Continue p.o. steroids PCCM consulted  Pulmonary hypertension ([place]) -Continue home  treprostinil     Obesity, Class III, BMI 40-49.9 (morbid obesity) ([place]) Complicates overall prognosis  Encouraged to lose weight       Chronic diastolic CHF (congestive  heart failure) ([place])  2D echo on 01/31/2020 showed EF of 30 to 35%.   BNP 515, no pulm edema on chest x-ray, patient does not seem to have CHF exacerbation, but obviously at high risk for developing CHF exacerbation 10/14 continue to hold diuretics for now due to sepsis Monitor volume status       Wound infection_left lower leg Wound care consult please see  note     Iron deficiency anemia Continue on iron supplement   PAF (paroxysmal atrial fibrillation) (HCC) Now afib rvr Resume cardizem at 54m q6hrs, bisoprolol 568mContinue eliquis     OSA (obstructive sleep apnea) Continue BiPAP have encouraged her to be more compliant   CAD (coronary artery disease) Continue Lipitor   PT rec. SNF  DVT prophylaxis: Eliquis Code Status: Full Family Communication: Son at bedside  Disposition Plan: Status is: Inpatient  Remains inpatient appropriate because:Inpatient level of care appropriate due to severity of illness  Dispo: The patient is from: SNF              Anticipated d/c is to: SNF              Patient currently is not medically stable to d/c.   Difficult to place patient No       Level of care: Progressive Cardiac  Consultants:  None  Procedures:  None  Antimicrobials: Vancomycin Cefepime   Subjective: Awake, responds today but little slow in responding. Not aware of date. Denies sob.   Objective: Vitals:   06/16/21 0435 06/16/21 0500 06/16/21 0749 06/16/21 0808  BP: 126/70  118/71   Pulse: 62  (!) 109 (!) 110  Resp: _0 Temp: (!) 97.5 F (36.4 C)  98.3 F (36.8 C)   TempSrc:   Oral   SpO2: 100%  99% 98%  Weight:  (!) 142.3 kg    Height:        Intake/Output Summary (Last 24 hours) at 06/16/2021 0851 Last data filed at 06/15/2021 1830 Gross per 24 hour  Intake 360 ml  Output 650 ml  Net -290 ml   Filed Weights   06/14/21 0246 06/15/21 0400 06/16/21 0500  Weight: (!) 140.2 kg (!) 141.1 kg (!) 142.3 kg    Examination: Nad, calm Eating breakfast Cta no w/r Irreg tachy, s1/s2  Soft benign +bs No edema Aaxoxo2, not to date   Data Reviewed: I have personally reviewed following labs and imaging studies  CBC: Recent Labs  Lab 06/20/2021 1140 06/13/21 0650 06/14/21 0508 06/15/21 0454 06/16/21 0813  WBC 18.2* 12.9* 11.8* 10.2 11.0*  NEUTROABS 16.5*  --  11.3* 9.5* 9.4*  HGB  9.3* 9.3* 8.1* 8.7* 8.6*  HCT 32.4* 30.5* 27.2* 29.1* 29.0*  MCV 93.1 90.2 90.4 92.7 89.0  PLT 322 297 271 273 [id number] Basic Metabolic Panel: Recent Labs  Lab 06/17/2021 1140 06/13/21 0650 06/14/21 0508 06/15/21 0454 06/16/21 0813  NA 139 136 136 137 140  K 4.3 4.7 4.4 4.3 4.7  CL 94* 95* 98 98 98  CO2 38* 33* 33* 35* 34*  GLUCOSE 108* 161* 175* 151* 164*  BUN 18 23 32* 33* 34*  CREATININE 0.99 1.02* 1.06* 0.88 0.92  CALCIUM 8.8* 8.9 8.9 9.1 9.1   GFR: Estimated Creatinine Clearance: 85.6 mL/min (by C-G formula based on SCr of 0.92 mg/dL). Liver Function Tests: Recent Labs  Lab 06/20/2021 1140  AST 12*  ALT 13  ALKPHOS 88  BILITOT 1.1  PROT 6.1*  ALBUMIN 3.3*   No results for input(s): LIPASE, AMYLASE in the last 168 hours. No results for input(s): AMMONIA in the last 168 hours.  Coagulation Profile: Recent Labs  Lab 06/13/2021 1140  INR 1.0   Cardiac Enzymes: No results for input(s): CKTOTAL, CKMB, CKMBINDEX, TROPONINI in the last 168 hours. BNP (last 3 results) No results for input(s): PROBNP in the last 8760 hours. HbA1C: No results for input(s): HGBA1C in the last 72 hours. CBG: Recent Labs  Lab 06/13/21 2207  GLUCAP 182*   Lipid Profile: No results for input(s): CHOL, HDL, LDLCALC, TRIG, CHOLHDL, LDLDIRECT in the last 72 hours. Thyroid Function Tests: No results for input(s): TSH, T4TOTAL, FREET4, T3FREE, THYROIDAB in the last 72 hours. Anemia Panel: No results for input(s): VITAMINB12, FOLATE, FERRITIN, TIBC, IRON, RETICCTPCT in the last 72 hours. Sepsis Labs: Recent Labs  Lab 06/21/2021 1140 06/13/21 0650  PROCALCITON <0.10  --   LATICACIDVEN 1.0 1.3    Recent Results (from the past 240 hour(s))  Resp Panel by RT-PCR (Flu A&B, Covid) Nasopharyngeal Swab     Status: None   Collection Time: 06/08/21  1:15 PM   Specimen: Nasopharyngeal Swab; Nasopharyngeal(NP) swabs in vial transport medium  Result Value Ref Range Status   SARS Coronavirus 2 by RT  PCR NEGATIVE NEGATIVE Final    Comment: (NOTE) SARS-CoV-2 target nucleic acids are NOT DETECTED.  The SARS-CoV-2 RNA is generally detectable in upper respiratory specimens during the acute phase of infection. The lowest concentration of SARS-CoV-2 viral copies this assay can detect is 138 copies/mL. A negative result does not preclude SARS-Cov-2 infection and should not be used as the sole basis for treatment or other patient management decisions. A negative result may occur with  improper specimen collection/handling, submission of specimen other than nasopharyngeal swab, presence of viral mutation(s) within the areas targeted by this assay, and inadequate number of viral copies(<138 copies/mL). A negative result must be combined with clinical observations, patient history, and epidemiological information. The expected result is Negative.  Fact Sheet for Patients:  EntrepreneurPulse.com.au  Fact Sheet for Healthcare Providers:  IncredibleEmployment.be  This test is no t yet approved or cleared by the [place] FDA and  has been authorized for detection and/or diagnosis of SARS-CoV-2 by FDA under an Emergency Use Authorization (EUA). This EUA will remain  in effect (meaning this test can be used) for the duration of the COVID-19 declaration under Section 564(b)(1) of the Act, 21 U.S.C.section 360bbb-3(b)(1), unless the authorization is terminated  or revoked sooner.       Influenza A by PCR NEGATIVE NEGATIVE Final   Influenza B by PCR NEGATIVE NEGATIVE Final    Comment: (NOTE) The Xpert Xpress SARS-CoV-2/FLU/RSV plus assay is intended as an aid in the diagnosis of influenza from Nasopharyngeal swab specimens and should not be used as a sole basis for treatment. Nasal washings and aspirates are unacceptable for Xpert Xpress SARS-CoV-2/FLU/RSV testing.  Fact Sheet for Patients: EntrepreneurPulse.com.au  Fact Sheet for  Healthcare Providers: IncredibleEmployment.be  This test is not yet approved or cleared by the [place] FDA and has been authorized for detection and/or diagnosis of SARS-CoV-2 by FDA under an Emergency Use Authorization (EUA). This EUA will remain in effect (meaning this test can be used) for the duration of the COVID-19 declaration under Section 564(b)(1) of the Act, 21 U.S.C. section 360bbb-3(b)(1), unless the authorization is terminated or revoked.  Performed at [facility], [address], [place], [place] [address]   Blood  Culture (routine x 2)     Status: None (Preliminary result)   Collection Time: 06/15/2021 11:40 AM   Specimen: BLOOD  Result Value Ref Range Status   Specimen Description BLOOD RIGHT ANTECUBITAL  Final   Special Requests   Final    BOTTLES DRAWN AEROBIC AND ANAEROBIC Blood Culture adequate volume   Culture   Final    NO GROWTH 4 DAYS Performed at [facility], [address], [place], [place] [address]    Report Status PENDING  Incomplete  Resp Panel by RT-PCR (Flu A&B, Covid) Nasopharyngeal Swab     Status: None   Collection Time: 06/20/2021  3:08 PM   Specimen: Nasopharyngeal Swab; Nasopharyngeal(NP) swabs in vial transport medium  Result Value Ref Range Status   SARS Coronavirus 2 by RT PCR NEGATIVE NEGATIVE Final    Comment: (NOTE) SARS-CoV-2 target nucleic acids are NOT DETECTED.  The SARS-CoV-2 RNA is generally detectable in upper respiratory specimens during the acute phase of infection. The lowest concentration of SARS-CoV-2 viral copies this assay can detect is 138 copies/mL. A negative result does not preclude SARS-Cov-2 infection and should not be used as the sole basis for treatment or other patient management decisions. A negative result may occur with  improper specimen collection/handling, submission of specimen other than nasopharyngeal swab, presence of viral mutation(s) within the areas  targeted by this assay, and inadequate number of viral copies(<138 copies/mL). A negative result must be combined with clinical observations, patient history, and epidemiological information. The expected result is Negative.  Fact Sheet for Patients:  EntrepreneurPulse.com.au  Fact Sheet for Healthcare Providers:  IncredibleEmployment.be  This test is no t yet approved or cleared by the [place] FDA and  has been authorized for detection and/or diagnosis of SARS-CoV-2 by FDA under an Emergency Use Authorization (EUA). This EUA will remain  in effect (meaning this test can be used) for the duration of the COVID-19 declaration under Section 564(b)(1) of the Act, 21 U.S.C.section 360bbb-3(b)(1), unless the authorization is terminated  or revoked sooner.       Influenza A by PCR NEGATIVE NEGATIVE Final   Influenza B by PCR NEGATIVE NEGATIVE Final    Comment: (NOTE) The Xpert Xpress SARS-CoV-2/FLU/RSV plus assay is intended as an aid in the diagnosis of influenza from Nasopharyngeal swab specimens and should not be used as a sole basis for treatment. Nasal washings and aspirates are unacceptable for Xpert Xpress SARS-CoV-2/FLU/RSV testing.  Fact Sheet for Patients: EntrepreneurPulse.com.au  Fact Sheet for Healthcare Providers: IncredibleEmployment.be  This test is not yet approved or cleared by the [place] FDA and has been authorized for detection and/or diagnosis of SARS-CoV-2 by FDA under an Emergency Use Authorization (EUA). This EUA will remain in effect (meaning this test can be used) for the duration of the COVID-19 declaration under Section 564(b)(1) of the Act, 21 U.S.C. section 360bbb-3(b)(1), unless the authorization is terminated or revoked.  Performed at [facility], [address] [place], [place] [address]   Culture, blood (Routine X 2) w Reflex to ID Panel     Status:  None (Preliminary result)   Collection Time: 06/13/21  6:50 AM   Specimen: BLOOD  Result Value Ref Range Status   Specimen Description BLOOD RIGHT HAND  Final   Special Requests   Final    BOTTLES DRAWN AEROBIC ONLY Blood Culture results may not be optimal due to an inadequate volume of blood received in culture bottles   Culture   Final  NO GROWTH 3 DAYS Performed at [facility], [address] [place], [place] [address]    Report Status PENDING  Incomplete  MRSA Next Gen by PCR, Nasal     Status: None   Collection Time: 06/14/21  9:00 AM   Specimen: Nasal Mucosa; Nasal Swab  Result Value Ref Range Status   MRSA by PCR Next Gen NOT DETECTED NOT DETECTED Final    Comment: (NOTE) The GeneXpert MRSA Assay (FDA approved for NASAL specimens only), is one component of a comprehensive MRSA colonization surveillance program. It is not intended to diagnose MRSA infection nor to guide or monitor treatment for MRSA infections. Test performance is not FDA approved in patients less than 38 years old. Performed at [facility], [address] [place], [place] [address]          Radiology Studies: No results found.      Scheduled Meds:  apixaban  5 mg Oral BID   atorvastatin  20 mg Oral QHS   collagenase   Topical Daily   famotidine  20 mg Oral QHS   ferrous gluconate  324 mg Oral Weekly   folic acid  1 mg Oral Daily   ipratropium  0.5 mg Nebulization TID   levalbuterol  1.25 mg Nebulization TID   loratadine  10 mg Oral Daily   methylPREDNISolone (SOLU-MEDROL) injection  60 mg Intravenous Daily   montelukast  10 mg Oral QHS   Treprostinil Diolamine ER  0.25 mg Oral BID   Continuous Infusions:     LOS: 4 days    Time spent: 45 minutes with more than 50% on COC    Nolberto Hanlon, MD [facility]   If 7PM-7AM, please contact night-coverage  06/16/2021, 8:51 AM

## 2021-06-17 ENCOUNTER — Inpatient Hospital Stay: Payer: Medicare Other

## 2021-06-17 DIAGNOSIS — J189 Pneumonia, unspecified organism: Secondary | ICD-10-CM | POA: Diagnosis not present

## 2021-06-17 LAB — CBC WITH DIFFERENTIAL/PLATELET
Abs Immature Granulocytes: 0.09 10*3/uL — ABNORMAL HIGH (ref 0.00–0.07)
Basophils Absolute: 0 10*3/uL (ref 0.0–0.1)
Basophils Relative: 0 %
Eosinophils Absolute: 0 10*3/uL (ref 0.0–0.5)
Eosinophils Relative: 0 %
HCT: 29.9 % — ABNORMAL LOW (ref 36.0–46.0)
Hemoglobin: 9.1 g/dL — ABNORMAL LOW (ref 12.0–15.0)
Immature Granulocytes: 1 %
Lymphocytes Relative: 8 %
Lymphs Abs: 1.1 10*3/uL (ref 0.7–4.0)
MCH: 27.8 pg (ref 26.0–34.0)
MCHC: 30.4 g/dL (ref 30.0–36.0)
MCV: 91.4 fL (ref 80.0–100.0)
Monocytes Absolute: 1.1 10*3/uL — ABNORMAL HIGH (ref 0.1–1.0)
Monocytes Relative: 8 %
Neutro Abs: 11.8 10*3/uL — ABNORMAL HIGH (ref 1.7–7.7)
Neutrophils Relative %: 83 %
Platelets: 237 10*3/uL (ref 150–400)
RBC: 3.27 MIL/uL — ABNORMAL LOW (ref 3.87–5.11)
RDW: 18.1 % — ABNORMAL HIGH (ref 11.5–15.5)
WBC: 14.1 10*3/uL — ABNORMAL HIGH (ref 4.0–10.5)
nRBC: 0 % (ref 0.0–0.2)

## 2021-06-17 LAB — BLOOD GAS, VENOUS
Acid-Base Excess: 9.2 mmol/L — ABNORMAL HIGH (ref 0.0–2.0)
Bicarbonate: 36.3 mmol/L — ABNORMAL HIGH (ref 20.0–28.0)
O2 Saturation: 94.1 %
Patient temperature: 37
pCO2, Ven: 60 mmHg (ref 44.0–60.0)
pH, Ven: 7.39 (ref 7.250–7.430)
pO2, Ven: 72 mmHg — ABNORMAL HIGH (ref 32.0–45.0)

## 2021-06-17 LAB — BASIC METABOLIC PANEL
Anion gap: 6 (ref 5–15)
BUN: 34 mg/dL — ABNORMAL HIGH (ref 8–23)
CO2: 36 mmol/L — ABNORMAL HIGH (ref 22–32)
Calcium: 8.9 mg/dL (ref 8.9–10.3)
Chloride: 98 mmol/L (ref 98–111)
Creatinine, Ser: 0.84 mg/dL (ref 0.44–1.00)
GFR, Estimated: >60 mL/min (ref >60)
Glucose, Bld: 133 mg/dL — ABNORMAL HIGH (ref 70–99)
Potassium: 4.7 mmol/L (ref 3.5–5.1)
Sodium: 140 mmol/L (ref 135–145)

## 2021-06-17 LAB — CULTURE, BLOOD (ROUTINE X 2)
Culture: NO GROWTH
Special Requests: ADEQUATE

## 2021-06-17 LAB — BRAIN NATRIURETIC PEPTIDE: B Natriuretic Peptide: 394.4 pg/mL — ABNORMAL HIGH (ref 0.0–100.0)

## 2021-06-17 MED ORDER — MIDODRINE HCL 5 MG PO TABS
5.0000 mg | ORAL_TABLET | Freq: Once | ORAL | Status: AC
  Administered 2021-06-17: 5 mg via ORAL
  Filled 2021-06-17: qty 1

## 2021-06-17 MED ORDER — BISOPROLOL FUMARATE 5 MG PO TABS
5.0000 mg | ORAL_TABLET | Freq: Once | ORAL | Status: AC
  Administered 2021-06-17: 5 mg via ORAL
  Filled 2021-06-17: qty 1

## 2021-06-17 MED ORDER — PREDNISONE 20 MG PO TABS
50.0000 mg | ORAL_TABLET | Freq: Every day | ORAL | Status: DC
  Administered 2021-06-18 – 2021-06-20 (×3): 50 mg via ORAL
  Filled 2021-06-17 (×3): qty 1

## 2021-06-17 MED ORDER — BISOPROLOL FUMARATE 5 MG PO TABS
10.0000 mg | ORAL_TABLET | Freq: Every day | ORAL | Status: DC
  Administered 2021-06-18 – 2021-06-20 (×3): 10 mg via ORAL
  Filled 2021-06-17 (×3): qty 2

## 2021-06-17 MED ORDER — MIDODRINE HCL 5 MG PO TABS
10.0000 mg | ORAL_TABLET | Freq: Three times a day (TID) | ORAL | Status: DC
  Administered 2021-06-17 – 2021-06-20 (×9): 10 mg via ORAL
  Filled 2021-06-17 (×9): qty 2

## 2021-06-17 NOTE — Progress Notes (Signed)
   06/17/21 0300  Assess: MEWS Score  Temp 98.5 F (36.9 C)  BP (!) 128/92  Pulse Rate (!) 135  Resp 18  SpO2 94 %  O2 Device Nasal Cannula  O2 Flow Rate (L/min) 4 L/min  Assess: MEWS Score  MEWS Temp 0  MEWS Systolic 0  MEWS Pulse 3  MEWS RR 0  MEWS LOC 0  MEWS Score 3  MEWS Score Color Yellow  Assess: if the MEWS score is Yellow or Red  Were vital signs taken at a resting state? No  Focused Assessment No change from prior assessment  Does the patient meet 2 or more of the SIRS criteria? No  MEWS guidelines implemented *See Row Information* No, vital signs rechecked  Treat  MEWS Interventions  (check VS within an hour)  Pain Scale 0-10  Pain Score 0  Take Vital Signs  Increase Vital Sign Frequency   (no)  Escalate  MEWS: Escalate  (no)  Notify: Charge Nurse/RN  Name of Charge Nurse/RN Notified yes  Date Charge Nurse/RN Notified 06/17/21  Time Charge Nurse/RN Notified 0441  Assess: SIRS CRITERIA  SIRS Temperature  0  SIRS Pulse 1  SIRS Respirations  0  SIRS WBC 0  SIRS Score Sum  1

## 2021-06-17 NOTE — Progress Notes (Signed)
Pulmonary Medicine          Date: 06/17/2021,   MRN# [id number] Paula Torres 29-May-1951     AdmissionWeight: (!) 140 kg                 CurrentWeight: (!) 142.7 kg   Referring physician: Dr. Francine Graven   CHIEF COMPLAINT:   Acute on chronic hypercapnic and hypoxemic respiratory failure   HISTORY OF PRESENT ILLNESS   Is a 70 year old female with a history of diastolic CHF as well as COPD and morbid obesity with pickwickian syndrome and obesity hypoventilation with chronic hypercapnia.  She also has a history of pulmonary hypertension, obstructive sleep apnea atrial fibrillation and essential hypertension.  She came into the hospital with complaints of acute onset dyspnea with hypoxemia.  Daughter was able to respond and was able to increase oxygen therapy as well as call EMS due to respiratory failure.  She was placed on BiPAP during my evaluation.   At baseline patient uses 4 L/min nasal cannula.  Pulmonary consultation for further evaluation and management of complex comorbid pulmonary patient.  I spoke with son Tiearra Colwell and daughter at bedside.  We discussed importance of BIPAP and or high risk of cardiac arrest due to respiratory failure.   06/17/21- patient with improved mentation post BIPAP 6h trial overnight.  We discussed importance of compliance.  She is on 4L/min [place].   There is VBG planned for today.  Repeat CXR today.  PT/OT today.   PAST MEDICAL HISTORY   Past Medical History:  Diagnosis Date   (HFpEF) heart failure with preserved ejection fraction (HCC)    Chronic respiratory failure with hypoxia (HCC)    COPD (chronic obstructive pulmonary disease) (HCC)    Coronary artery disease, non-occlusive    Hypertension    OSA (obstructive sleep apnea)    PAF (paroxysmal atrial fibrillation) ([place])    Pulmonary hypertension (HCC)      SURGICAL HISTORY   Past Surgical History:  Procedure Laterality Date   NO PAST SURGERIES     RIGHT/LEFT HEART CATH  AND CORONARY ANGIOGRAPHY Left 12/21/2019   Procedure: RIGHT/LEFT HEART CATH AND CORONARY ANGIOGRAPHY;  Surgeon: Wellington Hampshire, MD;  Location: [facility];  Service: Cardiovascular;  Laterality: Left;     FAMILY HISTORY   Family History  Problem Relation Age of Onset   Hypertension Other      SOCIAL HISTORY   Social History   Tobacco Use   Smoking status: Former    Types: Cigarettes    Quit date: 06/03/2017    Years since quitting: 4.0   Smokeless tobacco: Never  Vaping Use   Vaping Use: Never used  Substance Use Topics   Alcohol use: Not Currently   Drug use: Never     MEDICATIONS    Home Medication:  REM   Current Medication:  Current Facility-Administered Medications:    acetaminophen (TYLENOL) tablet 650 mg, 650 mg, Oral, Q6H PRN, Ivor Costa, MD   apixaban (ELIQUIS) tablet 5 mg, 5 mg, Oral, BID, Deal, Justice Britain, RPH, 5 mg at 06/17/21 6237   atorvastatin (LIPITOR) tablet 20 mg, 20 mg, Oral, QHS, Niu, Soledad Gerlach, MD, 20 mg at 06/16/21 2253   bisoprolol (ZEBETA) tablet 5 mg, 5 mg, Oral, Daily, Kurtis Bushman, Sahar, MD, 5 mg at 06/17/21 6283   clonazePAM (KLONOPIN) tablet 0.5 mg, 0.5 mg, Oral, TID PRN, Ottie Glazier, MD   collagenase (SANTYL) ointment, , Topical, Daily, Ivor Costa, MD, Given at  06/17/21 0823   dextromethorphan-guaiFENesin (MUCINEX DM) 30-600 MG per 12 hr tablet 1 tablet, 1 tablet, Oral, BID PRN, Ivor Costa, MD   diltiazem (CARDIZEM) tablet 60 mg, 60 mg, Oral, Q6H, Nolberto Hanlon, MD, 60 mg at 06/17/21 0533   docusate sodium (COLACE) capsule 100 mg, 100 mg, Oral, Daily PRN, Ivor Costa, MD, 100 mg at 06/14/21 0827   famotidine (PEPCID) tablet 20 mg, 20 mg, Oral, QHS, Ivor Costa, MD, 20 mg at 06/16/21 2253   ferrous gluconate (FERGON) tablet 324 mg, 324 mg, Oral, Weekly, Ivor Costa, MD   folic acid (FOLVITE) tablet 1 mg, 1 mg, Oral, Daily, Ivor Costa, MD, 1 mg at 06/17/21 5465   hydrALAZINE (APRESOLINE) injection 5 mg, 5 mg, Intravenous, Q2H PRN, Ivor Costa, MD   ipratropium (ATROVENT) nebulizer solution 0.5 mg, 0.5 mg, Nebulization, TID, Ivor Costa, MD, 0.5 mg at 06/17/21 0815   levalbuterol (XOPENEX) nebulizer solution 0.63 mg, 0.63 mg, Nebulization, Q6H PRN, Ivor Costa, MD, 0.63 mg at 06/17/21 0815   levalbuterol (XOPENEX) nebulizer solution 1.25 mg, 1.25 mg, Nebulization, TID, Nolberto Hanlon, MD, 1.25 mg at 06/16/21 2046   loratadine (CLARITIN) tablet 10 mg, 10 mg, Oral, Daily, Ivor Costa, MD, 10 mg at 06/17/21 6812   methylPREDNISolone sodium succinate (SOLU-MEDROL) 40 mg/mL injection 40 mg, 40 mg, Intravenous, Daily, Ottie Glazier, MD, 40 mg at 06/17/21 7517   metoprolol tartrate (LOPRESSOR) injection 5 mg, 5 mg, Intravenous, Q4H PRN, Sharion Settler, NP, 5 mg at 06/16/21 1529   midodrine (PROAMATINE) tablet 10 mg, 10 mg, Oral, TID WC, Nolberto Hanlon, MD   montelukast (SINGULAIR) tablet 10 mg, 10 mg, Oral, QHS, Ivor Costa, MD, 10 mg at 06/16/21 2255   ondansetron (ZOFRAN) injection 4 mg, 4 mg, Intravenous, Q8H PRN, Ivor Costa, MD   Treprostinil Diolamine ER TBCR 0.25 mg, 0.25 mg, Oral, BID, Ivor Costa, MD    ALLERGIES   Codeine and Sulfa antibiotics     REVIEW OF SYSTEMS    Review of Systems:  Gen:  Denies  fever, sweats, chills weigh loss  HEENT: Denies blurred vision, double vision, ear pain, eye pain, hearing loss, nose bleeds, sore throat Cardiac:  No dizziness, chest pain or heaviness, chest tightness,edema Resp:   Denies cough or sputum porduction, shortness of breath,wheezing, hemoptysis,  Gi: Denies swallowing difficulty, stomach pain, nausea or vomiting, diarrhea, constipation, bowel incontinence Gu:  Denies bladder incontinence, burning urine Ext:   Denies Joint pain, stiffness or swelling Skin: Denies  skin rash, easy bruising or bleeding or hives Endoc:  Denies polyuria, polydipsia , polyphagia or weight change Psych:   Denies depression, insomnia or hallucinations   Other:  All other systems negative   VS:  BP 109/71 (BP Location: Left Arm)   Pulse (!) 108   Temp 97.8 F (36.6 C) (Oral)   Resp 20   Ht _0  (1.702 m)   Wt (!) 142.7 kg   SpO2 96%   BMI 49.27 kg/m      PHYSICAL EXAM    GENERAL:NAD, no fevers, chills, no weakness no fatigue HEAD: Normocephalic, atraumatic.  EYES: Pupils equal, round, reactive to light. Extraocular muscles intact. No scleral icterus.  MOUTH: Moist mucosal membrane. Dentition intact. No abscess noted.  EAR, NOSE, THROAT: Clear without exudates. No external lesions.  NECK: Supple. No thyromegaly. No nodules. No JVD.  PULMONARY:  CARDIOVASCULAR: S1 and S2. Regular rate and rhythm. No murmurs, rubs, or gallops. No edema. Pedal pulses 2+ bilaterally.  GASTROINTESTINAL: Soft, nontender, nondistended.  No masses. Positive bowel sounds. No hepatosplenomegaly.  MUSCULOSKELETAL: No swelling, clubbing, or edema. Range of motion full in all extremities.  NEUROLOGIC: Cranial nerves II through XII are intact. No gross focal neurological deficits. Sensation intact. Reflexes intact.  SKIN: No ulceration, lesions, rashes, or cyanosis. Skin warm and dry. Turgor intact.  PSYCHIATRIC: Mood, affect within normal limits. The patient is awake, alert and oriented x 3. Insight, judgment intact.       IMAGING    CT HEAD WO CONTRAST (5MM)  Result Date: 06/15/2021 CLINICAL DATA:  Mental status change, unknown cause. EXAM: CT HEAD WITHOUT CONTRAST TECHNIQUE: Contiguous axial images were obtained from the base of the skull through the vertex without intravenous contrast. COMPARISON:  Prior head CT examinations 05/28/2021 and earlier. FINDINGS: Brain: Mild generalized cerebral atrophy. Mild to moderate patchy and ill-defined hypoattenuation within the cerebral white matter, nonspecific but compatible with chronic small vessel ischemic disease. There is no acute intracranial hemorrhage. No demarcated cortical infarct. No extra-axial fluid collection. No evidence of an intracranial  mass. No midline shift. Partially empty sella turcica. Vascular: No hyperdense vessel. Atherosclerotic calcifications. Skull: Normal. Negative for fracture or focal lesion. Sinuses/Orbits: Visualized orbits show no acute finding. Trace bilateral ethmoid sinus mucosal thickening. Small mucous retention cyst within a right ethmoid air cell. IMPRESSION: No evidence of acute intracranial abnormality. Mild-to-moderate chronic small vessel ischemic changes within the cerebral white matter. Mild generalized cerebral atrophy. Mild ethmoid sinus disease, as described. Electronically Signed   By: Kellie Simmering D.O.   On: 06/28/2021 16:01   CT HEAD WO CONTRAST (5MM)  Result Date: 05/28/2021 CLINICAL DATA:  Delirium. EXAM: CT HEAD WITHOUT CONTRAST TECHNIQUE: Contiguous axial images were obtained from the base of the skull through the vertex without intravenous contrast. COMPARISON:  CT head 04/25/2021 BRAIN: BRAIN Patchy and confluent areas of decreased attenuation are noted throughout the deep and periventricular white matter of the cerebral hemispheres bilaterally, compatible with chronic microvascular ischemic disease. No evidence of large-territorial acute infarction. No parenchymal hemorrhage. No mass lesion. No extra-axial collection. No mass effect or midline shift. No hydrocephalus. Basilar cisterns are patent. Vascular: No hyperdense vessel. Atherosclerotic calcifications are present within the cavernous internal carotid arteries. Skull: No acute fracture or focal lesion. Sinuses/Orbits: Paranasal sinuses and mastoid air cells are clear. The orbits are unremarkable. Other: None. IMPRESSION: No acute intracranial abnormality. Electronically Signed   By: Iven Finn M.D.   On: 05/28/2021 18:00   US Venous Img Lower Unilateral Left (DVT)  Result Date: 05/28/2021 CLINICAL DATA:  Recent fall, cellulitis, pain and swelling EXAM: LEFT LOWER EXTREMITY VENOUS DOPPLER ULTRASOUND TECHNIQUE: Gray-scale sonography with  compression, as well as color and duplex ultrasound, were performed to evaluate the deep venous system(s) from the level of the common femoral vein through the popliteal and proximal calf veins. COMPARISON:  None. FINDINGS: VENOUS Normal compressibility of the common femoral, superficial femoral, and popliteal veins, as well as the visualized calf veins. Visualized portions of profunda femoral vein and great saphenous vein unremarkable. No filling defects to suggest DVT on grayscale or color Doppler imaging. Doppler waveforms show normal direction of venous flow, normal respiratory plasticity and response to augmentation. Limited views of the contralateral common femoral vein are unremarkable. OTHER None. Limitations: none IMPRESSION: Negative examination for deep venous thrombosis in the left lower extremity. Electronically Signed   By: Eddie Candle M.D.   On: 05/28/2021 14:37   DG Chest Port 1 View  Result Date: 07/01/2021 CLINICAL DATA:  Questionable  sepsis - evaluate for abnormality new consolidation in the right lower EXAM: PORTABLE CHEST 1 VIEW COMPARISON:  05/28/2021. FINDINGS: New consolidation in the right lower lobe. Chronic interstitial prominence, slightly increased in conspicuity on the right. No visible pleural effusions or pneumothorax. Limited evaluation left lung apex to overlapping head/neck. Similar enlargement the cardiac silhouette. Calcific atherosclerosis aorta. Chronic right rib fractures. Chronic left proximal humerus fracture. IMPRESSION: 1. New consolidation in the right lower lobe, concerning for pneumonia. 2. Chronic interstitial prominence, slightly increased in conspicuity on the right. This could represent chronic change, but infection or mild asymmetric edema are considerations. Electronically Signed   By: Margaretha Sheffield M.D.   On: 06/08/2021 12:40   DG Chest Port 1 View  Result Date: 05/28/2021 CLINICAL DATA:  Multiple falls, generalized weakness for about a week.  Bilateral lower extremity edema. Is on oxygen at home. Hx of CHF, COPD. EXAM: PORTABLE CHEST 1 VIEW COMPARISON:  04/26/2021 FINDINGS: Mild enlargement of the cardiac silhouette, stable. No mediastinal or hilar masses. Prominent vascular markings similar to the prior study. Linear opacity at the left lung base consistent with atelectasis or scarring. No lung consolidation. No convincing edema. No visualized pleural effusion and no evidence of a pneumothorax. 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 IMPRESSION: 1. No acute cardiopulmonary disease. Electronically Signed   By: Lajean Manes M.D.   On: 05/28/2021 12:22   DG Knee Complete 4 Views Left  Result Date: 05/25/2021 CLINICAL DATA:  Fall.  Pain EXAM: LEFT KNEE - COMPLETE 4+ VIEW COMPARISON:  None. FINDINGS: There is diffuse soft tissue edema involving the anterior and lateral soft tissues. No underlying fracture or dislocation. Tiny joint effusion within the suprapatellar joint space. Patellofemoral and medial compartment narrowing. IMPRESSION: 1. No acute bone abnormality. 2. Soft tissue edema within the anterior and lateral soft tissues which may reflect contusion. 3. Tiny joint effusion. Electronically Signed   By: Kerby Moors M.D.   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  Total face to face encounter time for this patient visit was 58mn. >50% of the time was  spent in counseling and coordination of care.   Patient/Family are satisfied with care plan and all questions have been answered.  This document was prepared using Dragon voice recognition software and may include unintentional dictation errors.     FOttie Glazier M.D.  [facility]

## 2021-06-17 NOTE — Progress Notes (Signed)
PROGRESS NOTE    Paula Torres  IRC:[id number] DOB: Oct 02, 1950 DOA: 06/20/2021 PCP: Rusty Aus, MD    Brief Narrative:  70 y.o. female with medical history significant of dCHF, hyperlipidemia, COPD on 3-4 L oxygen, GERD, depression, PAF (was on Eliquis which is on hold due to recent leg wound bleeding), pulmonary hypertension, OSA on BiPAP, CAD, iron deficiency anemia, CKD-3A, who presents with AMS and SOB.   Per her daughter at the bedside, patient normally needs to wear BiPAP for OSA, but patient is fully compliant with BiPAP.  When she does not use BiPAP, she often becomes confused due to elevated CO2 level.  Today patient was found to be confused again.  She has some shortness of breath, no worsening cough, no fever or chills.  Patient has some left-sided chest pain.  No active nausea ,vomiting, diarrhea noted. When I saw pt in ED, she is confused, knows her own name, not oriented to place and time. Of note, patient was recently hospitalized from 9/25 to 10/6 due to left lower leg wound infection with surrounding cellulitis.  Patient was discharged on cefadroxil.    Patient initially had oxygen desaturated to 88% on home level 3 L oxygen, BiPAP was started in ED.  First ABG with pH 7.24, CO2 88, O2 32.  The repeated VBG after started BiPAP showed pH 7.34, CO2 79, O2 36.  Patient remains on BiPAP the following morning.  Lethargic but does awaken and answers questions appropriately.  10/12 refused bipap last night 10/13- only used bipap for very short period last night. This am lethargic, doesn't open her eyes. Was given xanax this am.  ABG ordered, co2 elevated, asked RT to place pt on bipap 10/14 awake today as pt used bipap last night per nursing. Had long time with pt's son who is present about her long term goals, especially if she is not going to be complaint with bipap. Discussed asking pccm who also knows her well to have discussion about this Tele afib rvr HR 130's. 10/15  patient was more compliant with BiPAP last night.  She is more awake alert and compensating this AM.    Assessment & Plan:   Principal Problem:   HCAP (healthcare-associated pneumonia) Active Problems:   COPD with acute exacerbation (HCC)   Obesity, Class III, BMI 40-49.9 (morbid obesity) ([place])   Pulmonary hypertension (HCC)   Acute on chronic respiratory failure with hypoxia and hypercapnia (HCC)   Wound infection_left lower leg   Chronic diastolic CHF (congestive heart failure) (HCC)   Iron deficiency anemia   PAF (paroxysmal atrial fibrillation) (HCC)   OSA (obstructive sleep apnea)   Sepsis (HCC)   CAD (coronary artery disease)   Pressure injury of skin  Sepsis and acute on chronic respiratory failure with hypoxia and hypercapnia due to HCAP healthcare-associated pneumonia) and COPD with acute exacerbation ([place])  Patient meets criteria for sepsis with WBC 18.2, tachycardia with heart rate 93, tachypnea with RR 23.  Lactic acid is normal 1.0. Blood pressure soft, hemodynamically stable. 10/12 continue BiPAP Sepsis ruled out Procalcitonin negative Will DC IV antibiotics Discussed with patient importance of using her BiPAP especially at nighttime when she is asleep Continue Xanax prn Urine Legionella and strep antigen pending 10/13 abg with elevated co2 , likely why she is lethargic.  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   BNP 515, no pulm edema on chest x-ray, patient does not seem to have CHF exacerbation, but obviously at high risk for developing CHF exacerbation 10/15 continue to hold diuretics for now due to sepsis and low BP  BNP elevated but less than her previous   PAF (paroxysmal atrial fibrillation) (HCC) Rate better controlled Continue Cardizem 60 every 6 hours Increase bisoprolol to 10 mg daily Continue Eliquis    Wound infection_left lower leg Wound care consult please see note     Iron deficiency anemia Continue on iron supplement      OSA (obstructive sleep apnea) Continue BiPAP have encouraged her to be more compliant   CAD (coronary artery disease) Continue Lipitor   PT rec. SNF  DVT prophylaxis: Eliquis Code Status: Full Family Communication: Son at bedside  Disposition Plan: Status is: Inpatient  Remains inpatient appropriate because:Inpatient level of care appropriate due to severity of illness  Dispo: The patient is from: SNF              Anticipated d/c is to: SNF              Patient currently is not medically stable to d/c.   Difficult to place patient No       Level of care: Progressive Cardiac  Consultants:  None  Procedures:  None  Antimicrobials: Vancomycin Cefepime   Subjective: Denies shortness of breath, chest pain, or dizziness eating breakfast.  Objective: Vitals:   06/17/21 0034 06/17/21 0300 06/17/21 0502 06/17/21 0838  BP: 111/67 (!) 128/92 103/80 109/71  Pulse: (!) 110 (!) 135 (!) 112 (!) 108  Resp: _0 Temp: 99.6 F (37.6 C) 98.5 F (36.9 C) 98.5 F (36.9 C) 97.8 F (36.6 C)  TempSrc:  Oral Oral Oral  SpO2: 97% 94% 95% 96%  Weight:  (!) 142.7 kg    Height:        Intake/Output Summary (Last 24 hours) at 06/17/2021 0927 Last data filed at 06/17/2021 0300 Gross per 24 hour  Intake --  Output 400 ml  Net -400 ml   Filed Weights   06/15/21 0400 06/16/21 0500 06/17/21 0300   Weight: (!) 141.1 kg (!) 142.3 kg (!) 142.7 kg    Examination: More awake and interactive this AM CTA no wheezing Irregular, mildly tacky, S1-S2 Soft benign positive bowel sounds No edema aaxoxo3  Data Reviewed: I have personally reviewed following labs and imaging studies  CBC: Recent Labs  Lab 06/08/2021 1140 06/13/21 0650 06/14/21 0508 06/15/21 0454 06/16/21 0813 06/17/21 0652  WBC 18.2* 12.9* 11.8* 10.2 11.0* 14.1*  NEUTROABS 16.5*  --  11.3* 9.5* 9.4* 11.8*  HGB 9.3* 9.3* 8.1* 8.7* 8.6* 9.1*  HCT 32.4* 30.5* 27.2* 29.1* 29.0* 29.9*  MCV 93.1 90.2 90.4 92.7 89.0 91.4  PLT 322 297 271 273 235 601   Basic Metabolic Panel: Recent Labs  Lab 06/13/21 0650 06/14/21 0508 06/15/21 0454 06/16/21 0813 06/17/21 0652  NA 136 136 137 140 140  K 4.7 4.4 4.3 4.7 4.7  CL 95* 98 98 98 98  CO2 33* 33* 35* 34* 36*  GLUCOSE 161* 175* 151* 164* 133*  BUN 23 32* 33* 34* 34*  CREATININE 1.02* 1.06* 0.88 0.92 0.84  CALCIUM 8.9 8.9 9.1 9.1 8.9   GFR: Estimated Creatinine Clearance: 93.8 mL/min (by C-G formula based on SCr of 0.84 mg/dL). Liver Function Tests: Recent Labs  Lab 06/25/2021 1140  AST 12*  ALT 13  ALKPHOS 88  BILITOT 1.1  PROT 6.1*  ALBUMIN 3.3*   No results for input(s): LIPASE, AMYLASE in the last 168 hours. No results for input(s): AMMONIA in the last 168 hours.  Coagulation Profile: Recent Labs  Lab 06/20/2021 1140  INR 1.0   Cardiac Enzymes: No results for input(s): CKTOTAL, CKMB, CKMBINDEX, TROPONINI in the last 168 hours. BNP (last 3 results) No results for input(s): PROBNP in the last 8760 hours. HbA1C: No results for input(s): HGBA1C in the last 72 hours. CBG: Recent Labs  Lab 06/13/21 2207  GLUCAP 182*   Lipid Profile: No results for input(s): CHOL, HDL, LDLCALC, TRIG, CHOLHDL, LDLDIRECT in the last 72 hours. Thyroid Function Tests: No results for input(s): TSH, T4TOTAL, FREET4, T3FREE, THYROIDAB in the last 72 hours. Anemia Panel: No  results for input(s): VITAMINB12, FOLATE, FERRITIN, TIBC, IRON, RETICCTPCT in the last 72 hours. Sepsis Labs: Recent Labs  Lab 06/14/2021 1140 06/13/21 0650  PROCALCITON <0.10  --   LATICACIDVEN 1.0 1.3    Recent Results (from the past 240 hour(s))  Resp Panel by RT-PCR (Flu A&B, Covid) Nasopharyngeal Swab     Status: None   Collection Time: 06/08/21  1:15 PM   Specimen: Nasopharyngeal Swab; Nasopharyngeal(NP) swabs in vial transport medium  Result Value Ref Range Status   SARS Coronavirus 2 by RT PCR NEGATIVE NEGATIVE Final    Comment: (NOTE) SARS-CoV-2 target nucleic acids are NOT DETECTED.  The SARS-CoV-2 RNA is generally detectable in upper respiratory specimens during the acute phase of infection. The lowest concentration of SARS-CoV-2 viral copies this assay can detect is 138 copies/mL. A negative result does not preclude SARS-Cov-2 infection and should not be used as the sole basis for treatment or other patient management decisions. A negative result may occur with  improper specimen collection/handling, submission of specimen other than nasopharyngeal swab, presence of viral mutation(s) within the areas targeted by this assay, and inadequate number of viral copies(<138 copies/mL). A negative result must be combined with clinical observations, patient history, and epidemiological information. The expected result is Negative.  Fact Sheet for Patients:  EntrepreneurPulse.com.au  Fact Sheet for Healthcare Providers:  IncredibleEmployment.be  This test is no t yet approved or cleared by the [place] FDA and  has been authorized for detection and/or diagnosis of SARS-CoV-2 by FDA under an Emergency Use Authorization (EUA). This EUA will remain  in effect (meaning this test can be used) for the duration of the COVID-19 declaration under Section 564(b)(1) of the Act, 21 U.S.C.section 360bbb-3(b)(1), unless the authorization is  terminated  or revoked sooner.       Influenza A by PCR NEGATIVE NEGATIVE Final   Influenza B by PCR NEGATIVE NEGATIVE Final    Comment: (NOTE) The Xpert Xpress SARS-CoV-2/FLU/RSV plus assay is intended as an aid in the diagnosis of influenza from Nasopharyngeal swab specimens and should not be used as a sole basis for treatment. Nasal washings and aspirates are unacceptable for Xpert Xpress SARS-CoV-2/FLU/RSV testing.  Fact Sheet for Patients: EntrepreneurPulse.com.au  Fact Sheet for Healthcare Providers: IncredibleEmployment.be  This test is not yet approved or  cleared by the [place] and has been authorized for detection and/or diagnosis of SARS-CoV-2 by FDA under an Emergency Use Authorization (EUA). This EUA will remain in effect (meaning this test can be used) for the duration of the COVID-19 declaration under Section 564(b)(1) of the Act, 21 U.S.C. section 360bbb-3(b)(1), unless the authorization is terminated or revoked.  Performed at [facility], [address] [place], [place] [address]   Blood Culture (routine x 2)     Status: None   Collection Time: 06/29/2021 11:40 AM   Specimen: BLOOD  Result Value Ref Range Status   Specimen Description BLOOD RIGHT ANTECUBITAL  Final   Special Requests   Final    BOTTLES DRAWN AEROBIC AND ANAEROBIC Blood Culture adequate volume   Culture   Final    NO GROWTH 5 DAYS Performed at [facility], [address] [place], [place] [address]    Report Status 06/17/2021 FINAL  Final  Resp Panel by RT-PCR (Flu A&B, Covid) Nasopharyngeal Swab     Status: None   Collection Time: 06/22/2021  3:08 PM   Specimen: Nasopharyngeal Swab; Nasopharyngeal(NP) swabs in vial transport medium  Result Value Ref Range Status   SARS Coronavirus 2 by RT PCR NEGATIVE NEGATIVE Final    Comment: (NOTE) SARS-CoV-2 target nucleic acids are NOT DETECTED.  The SARS-CoV-2 RNA is generally  detectable in upper respiratory specimens during the acute phase of infection. The lowest concentration of SARS-CoV-2 viral copies this assay can detect is 138 copies/mL. A negative result does not preclude SARS-Cov-2 infection and should not be used as the sole basis for treatment or other patient management decisions. A negative result may occur with  improper specimen collection/handling, submission of specimen other than nasopharyngeal swab, presence of viral mutation(s) within the areas targeted by this assay, and inadequate number of viral copies(<138 copies/mL). A negative result must be combined with clinical observations, patient history, and epidemiological information. The expected result is Negative.  Fact Sheet for Patients:  EntrepreneurPulse.com.au  Fact Sheet for Healthcare Providers:  IncredibleEmployment.be  This test is no t yet approved or cleared by the [place] FDA and  has been authorized for detection and/or diagnosis of SARS-CoV-2 by FDA under an Emergency Use Authorization (EUA). This EUA will remain  in effect (meaning this test can be used) for the duration of the COVID-19 declaration under Section 564(b)(1) of the Act, 21 U.S.C.section 360bbb-3(b)(1), unless the authorization is terminated  or revoked sooner.       Influenza A by PCR NEGATIVE NEGATIVE Final   Influenza B by PCR NEGATIVE NEGATIVE Final    Comment: (NOTE) The Xpert Xpress SARS-CoV-2/FLU/RSV plus assay is intended as an aid in the diagnosis of influenza from Nasopharyngeal swab specimens and should not be used as a sole basis for treatment. Nasal washings and aspirates are unacceptable for Xpert Xpress SARS-CoV-2/FLU/RSV testing.  Fact Sheet for Patients: EntrepreneurPulse.com.au  Fact Sheet for Healthcare Providers: IncredibleEmployment.be  This test is not yet approved or cleared by the [place] FDA  and has been authorized for detection and/or diagnosis of SARS-CoV-2 by FDA under an Emergency Use Authorization (EUA). This EUA will remain in effect (meaning this test can be used) for the duration of the COVID-19 declaration under Section 564(b)(1) of the Act, 21 U.S.C. section 360bbb-3(b)(1), unless the authorization is terminated or revoked.  Performed at [facility], [address] [place], [place] [address]   Culture, blood (Routine X 2) w Reflex to  ID Panel     Status: None (Preliminary result)   Collection Time: 06/13/21  6:50 AM   Specimen: BLOOD  Result Value Ref Range Status   Specimen Description BLOOD RIGHT HAND  Final   Special Requests   Final    BOTTLES DRAWN AEROBIC ONLY Blood Culture results may not be optimal due to an inadequate volume of blood received in culture bottles   Culture   Final    NO GROWTH 4 DAYS Performed at [facility], [address] [place], [place] [address]    Report Status PENDING  Incomplete  MRSA Next Gen by PCR, Nasal     Status: None   Collection Time: 06/14/21  9:00 AM   Specimen: Nasal Mucosa; Nasal Swab  Result Value Ref Range Status   MRSA by PCR Next Gen NOT DETECTED NOT DETECTED Final    Comment: (NOTE) The GeneXpert MRSA Assay (FDA approved for NASAL specimens only), is one component of a comprehensive MRSA colonization surveillance program. It is not intended to diagnose MRSA infection nor to guide or monitor treatment for MRSA infections. Test performance is not FDA approved in patients less than 16 years old. Performed at [facility], [address] [place], [place] [address]          Radiology Studies: No results found.      Scheduled Meds:  apixaban  5 mg Oral BID   atorvastatin  20 mg Oral QHS   bisoprolol  5 mg Oral Daily   collagenase   Topical Daily   diltiazem  60 mg Oral Q6H   famotidine  20 mg Oral QHS   ferrous gluconate  324 mg Oral Weekly   folic acid  1 mg  Oral Daily   ipratropium  0.5 mg Nebulization TID   levalbuterol  1.25 mg Nebulization TID   loratadine  10 mg Oral Daily   methylPREDNISolone (SOLU-MEDROL) injection  40 mg Intravenous Daily   midodrine  10 mg Oral TID WC   midodrine  5 mg Oral Once   montelukast  10 mg Oral QHS   Treprostinil Diolamine ER  0.25 mg Oral BID   Continuous Infusions:     LOS: 5 days    Time spent: 35 minutes with more than 50% on [place], MD [facility]   If 7PM-7AM, please contact night-coverage  06/17/2021, 9:27 AM

## 2021-06-18 DIAGNOSIS — J189 Pneumonia, unspecified organism: Secondary | ICD-10-CM | POA: Diagnosis not present

## 2021-06-18 LAB — CBC WITH DIFFERENTIAL/PLATELET
Abs Immature Granulocytes: 0.04 10*3/uL (ref 0.00–0.07)
Basophils Absolute: 0 10*3/uL (ref 0.0–0.1)
Basophils Relative: 0 %
Eosinophils Absolute: 0 10*3/uL (ref 0.0–0.5)
Eosinophils Relative: 0 %
HCT: 30.8 % — ABNORMAL LOW (ref 36.0–46.0)
Hemoglobin: 8.9 g/dL — ABNORMAL LOW (ref 12.0–15.0)
Immature Granulocytes: 0 %
Lymphocytes Relative: 6 %
Lymphs Abs: 0.6 10*3/uL — ABNORMAL LOW (ref 0.7–4.0)
MCH: 25.9 pg — ABNORMAL LOW (ref 26.0–34.0)
MCHC: 28.9 g/dL — ABNORMAL LOW (ref 30.0–36.0)
MCV: 89.5 fL (ref 80.0–100.0)
Monocytes Absolute: 0.7 10*3/uL (ref 0.1–1.0)
Monocytes Relative: 7 %
Neutro Abs: 8.6 10*3/uL — ABNORMAL HIGH (ref 1.7–7.7)
Neutrophils Relative %: 87 %
Platelets: 243 10*3/uL (ref 150–400)
RBC: 3.44 MIL/uL — ABNORMAL LOW (ref 3.87–5.11)
RDW: 18.1 % — ABNORMAL HIGH (ref 11.5–15.5)
WBC: 10 10*3/uL (ref 4.0–10.5)
nRBC: 0 % (ref 0.0–0.2)

## 2021-06-18 LAB — BASIC METABOLIC PANEL
Anion gap: 5 (ref 5–15)
BUN: 33 mg/dL — ABNORMAL HIGH (ref 8–23)
CO2: 35 mmol/L — ABNORMAL HIGH (ref 22–32)
Calcium: 8.7 mg/dL — ABNORMAL LOW (ref 8.9–10.3)
Chloride: 97 mmol/L — ABNORMAL LOW (ref 98–111)
Creatinine, Ser: 0.76 mg/dL (ref 0.44–1.00)
GFR, Estimated: >60 mL/min (ref >60)
Glucose, Bld: 157 mg/dL — ABNORMAL HIGH (ref 70–99)
Potassium: 4.9 mmol/L (ref 3.5–5.1)
Sodium: 137 mmol/L (ref 135–145)

## 2021-06-18 LAB — CULTURE, BLOOD (ROUTINE X 2): Culture: NO GROWTH

## 2021-06-18 MED ORDER — DILTIAZEM HCL 30 MG PO TABS
90.0000 mg | ORAL_TABLET | Freq: Four times a day (QID) | ORAL | Status: DC
  Administered 2021-06-18 – 2021-06-20 (×6): 90 mg via ORAL
  Filled 2021-06-18 (×7): qty 3

## 2021-06-18 NOTE — Progress Notes (Signed)
Pulmonary Medicine          Date: 06/18/2021,   MRN# [id number] Paula Torres [birth year]     AdmissionWeight: (!) 140 kg                 CurrentWeight: (!) 142.6 kg   Referring physician: Dr. Francine Graven   CHIEF COMPLAINT:   Acute on chronic hypercapnic and hypoxemic respiratory failure   HISTORY OF PRESENT ILLNESS   Is a 70 year old female with a history of diastolic CHF as well as COPD and morbid obesity with pickwickian syndrome and obesity hypoventilation with chronic hypercapnia.  She also has a history of pulmonary hypertension, obstructive sleep apnea atrial fibrillation and essential hypertension.  She came into the hospital with complaints of acute onset dyspnea with hypoxemia.  Daughter was able to respond and was able to increase oxygen therapy as well as call EMS due to respiratory failure.  She was placed on BiPAP during my evaluation.   At baseline patient uses 4 L/min nasal cannula.  Pulmonary consultation for further evaluation and management of complex comorbid pulmonary patient.  I spoke with son Zyonna Vardaman and daughter at bedside.  We discussed importance of BIPAP and or high risk of cardiac arrest due to respiratory failure.   06/17/21- patient with improved mentation post BIPAP 6h trial overnight.  We discussed importance of compliance.  She is on 4L/min [place].   There is VBG planned for today.  Repeat CXR today.  PT/OT today.   06/18/21-  patient with intermittent confused.  Family with covid+  PAST MEDICAL HISTORY   Past Medical History:  Diagnosis Date   (HFpEF) heart failure with preserved ejection fraction (HCC)    Chronic respiratory failure with hypoxia (HCC)    COPD (chronic obstructive pulmonary disease) (HCC)    Coronary artery disease, non-occlusive    Hypertension    OSA (obstructive sleep apnea)    PAF (paroxysmal atrial fibrillation) ([place])    Pulmonary hypertension (HCC)      SURGICAL HISTORY   Past Surgical History:   Procedure Laterality Date   NO PAST SURGERIES     RIGHT/LEFT HEART CATH AND CORONARY ANGIOGRAPHY Left 12/21/2019   Procedure: RIGHT/LEFT HEART CATH AND CORONARY ANGIOGRAPHY;  Surgeon: Wellington Hampshire, MD;  Location: [facility];  Service: Cardiovascular;  Laterality: Left;     FAMILY HISTORY   Family History  Problem Relation Age of Onset   Hypertension Other      SOCIAL HISTORY   Social History   Tobacco Use   Smoking status: Former    Types: Cigarettes    Quit date: 06/03/2017    Years since quitting: 4.0   Smokeless tobacco: Never  Vaping Use   Vaping Use: Never used  Substance Use Topics   Alcohol use: Not Currently   Drug use: Never     MEDICATIONS    Home Medication:  REM   Current Medication:  Current Facility-Administered Medications:    acetaminophen (TYLENOL) tablet 650 mg, 650 mg, Oral, Q6H PRN, Ivor Costa, MD   apixaban (ELIQUIS) tablet 5 mg, 5 mg, Oral, BID, Deal, Justice Britain, RPH, 5 mg at 06/18/21 0826   atorvastatin (LIPITOR) tablet 20 mg, 20 mg, Oral, QHS, Niu, Soledad Gerlach, MD, 20 mg at 06/17/21 2145   bisoprolol (ZEBETA) tablet 10 mg, 10 mg, Oral, Daily, Kurtis Bushman, Sahar, MD, 10 mg at 06/18/21 0826   clonazePAM (KLONOPIN) tablet 0.5 mg, 0.5 mg, Oral, TID PRN, Ottie Glazier, MD  collagenase (SANTYL) ointment, , Topical, Daily, Ivor Costa, MD, Given at 06/18/21 0827   dextromethorphan-guaiFENesin ([place] DM) 30-600 MG per 12 hr tablet 1 tablet, 1 tablet, Oral, BID PRN, Ivor Costa, MD   diltiazem (CARDIZEM) tablet 60 mg, 60 mg, Oral, Q6H, Nolberto Hanlon, MD, 60 mg at 06/18/21 1226   docusate sodium (COLACE) capsule 100 mg, 100 mg, Oral, Daily PRN, Ivor Costa, MD, 100 mg at 06/14/21 0827   famotidine (PEPCID) tablet 20 mg, 20 mg, Oral, QHS, Ivor Costa, MD, 20 mg at 06/17/21 2145   ferrous gluconate (FERGON) tablet 324 mg, 324 mg, Oral, Weekly, Ivor Costa, MD   folic acid (FOLVITE) tablet 1 mg, 1 mg, Oral, Daily, Ivor Costa, MD, 1 mg at 06/18/21  0355   ipratropium (ATROVENT) nebulizer solution 0.5 mg, 0.5 mg, Nebulization, TID, Ivor Costa, MD, 0.5 mg at 06/18/21 0802   levalbuterol (XOPENEX) nebulizer solution 0.63 mg, 0.63 mg, Nebulization, Q6H PRN, Ivor Costa, MD, 0.63 mg at 06/18/21 0802   levalbuterol (XOPENEX) nebulizer solution 1.25 mg, 1.25 mg, Nebulization, TID, Nolberto Hanlon, MD, 1.25 mg at 06/17/21 2200   loratadine (CLARITIN) tablet 10 mg, 10 mg, Oral, Daily, Ivor Costa, MD, 10 mg at 06/18/21 9741   midodrine (PROAMATINE) tablet 10 mg, 10 mg, Oral, TID WC, Nolberto Hanlon, MD, 10 mg at 06/18/21 1226   montelukast (SINGULAIR) tablet 10 mg, 10 mg, Oral, QHS, Ivor Costa, MD, 10 mg at 06/17/21 2144   ondansetron (ZOFRAN) injection 4 mg, 4 mg, Intravenous, Q8H PRN, Ivor Costa, MD   predniSONE (DELTASONE) tablet 50 mg, 50 mg, Oral, Q breakfast, Ottie Glazier, MD, 50 mg at 06/18/21 6384   Treprostinil Diolamine ER TBCR 0.25 mg, 0.25 mg, Oral, BID, Ivor Costa, MD    ALLERGIES   Codeine and Sulfa antibiotics     REVIEW OF SYSTEMS    Review of Systems:  Gen:  Denies  fever, sweats, chills weigh loss  HEENT: Denies blurred vision, double vision, ear pain, eye pain, hearing loss, nose bleeds, sore throat Cardiac:  No dizziness, chest pain or heaviness, chest tightness,edema Resp:   Denies cough or sputum porduction, shortness of breath,wheezing, hemoptysis,  Gi: Denies swallowing difficulty, stomach pain, nausea or vomiting, diarrhea, constipation, bowel incontinence Gu:  Denies bladder incontinence, burning urine Ext:   Denies Joint pain, stiffness or swelling Skin: Denies  skin rash, easy bruising or bleeding or hives Endoc:  Denies polyuria, polydipsia , polyphagia or weight change Psych:   Denies depression, insomnia or hallucinations   Other:  All other systems negative   VS: BP 130/66 (BP Location: Left Wrist)   Pulse 82   Temp 98.3 F (36.8 C)   Resp 18   Ht _0  (1.702 m)   Wt (!) 142.6 kg   SpO2 95%    BMI 49.24 kg/m      PHYSICAL EXAM    GENERAL:NAD, no fevers, chills, no weakness no fatigue HEAD: Normocephalic, atraumatic.  EYES: Pupils equal, round, reactive to light. Extraocular muscles intact. No scleral icterus.  MOUTH: Moist mucosal membrane. Dentition intact. No abscess noted.  EAR, NOSE, THROAT: Clear without exudates. No external lesions.  NECK: Supple. No thyromegaly. No nodules. No JVD.  PULMONARY:  CARDIOVASCULAR: S1 and S2. Regular rate and rhythm. No murmurs, rubs, or gallops. No edema. Pedal pulses 2+ bilaterally.  GASTROINTESTINAL: Soft, nontender, nondistended. No masses. Positive bowel sounds. No hepatosplenomegaly.  MUSCULOSKELETAL: No swelling, clubbing, or edema. Range of motion full in all extremities.  NEUROLOGIC: Cranial nerves  II through XII are intact. No gross focal neurological deficits. Sensation intact. Reflexes intact.  SKIN: No ulceration, lesions, rashes, or cyanosis. Skin warm and dry. Turgor intact.  PSYCHIATRIC: Mood, affect within normal limits. The patient is awake, alert and oriented x 3. Insight, judgment intact.       IMAGING    CT HEAD WO CONTRAST (5MM)  Result Date: 06/17/2021 CLINICAL DATA:  Mental status change, unknown cause. EXAM: CT HEAD WITHOUT CONTRAST TECHNIQUE: Contiguous axial images were obtained from the base of the skull through the vertex without intravenous contrast. COMPARISON:  Prior head CT examinations 05/28/2021 and earlier. FINDINGS: Brain: Mild generalized cerebral atrophy. Mild to moderate patchy and ill-defined hypoattenuation within the cerebral white matter, nonspecific but compatible with chronic small vessel ischemic disease. There is no acute intracranial hemorrhage. No demarcated cortical infarct. No extra-axial fluid collection. No evidence of an intracranial mass. No midline shift. Partially empty sella turcica. Vascular: No hyperdense vessel. Atherosclerotic calcifications. Skull: Normal. Negative for  fracture or focal lesion. Sinuses/Orbits: Visualized orbits show no acute finding. Trace bilateral ethmoid sinus mucosal thickening. Small mucous retention cyst within a right ethmoid air cell. IMPRESSION: No evidence of acute intracranial abnormality. Mild-to-moderate chronic small vessel ischemic changes within the cerebral white matter. Mild generalized cerebral atrophy. Mild ethmoid sinus disease, as described. Electronically Signed   By: Kellie Simmering D.O.   On: 06/26/2021 16:01   CT HEAD WO CONTRAST (5MM)  Result Date: 05/28/2021 CLINICAL DATA:  Delirium. EXAM: CT HEAD WITHOUT CONTRAST TECHNIQUE: Contiguous axial images were obtained from the base of the skull through the vertex without intravenous contrast. COMPARISON:  CT head 04/25/2021 BRAIN: BRAIN Patchy and confluent areas of decreased attenuation are noted throughout the deep and periventricular white matter of the cerebral hemispheres bilaterally, compatible with chronic microvascular ischemic disease. No evidence of large-territorial acute infarction. No parenchymal hemorrhage. No mass lesion. No extra-axial collection. No mass effect or midline shift. No hydrocephalus. Basilar cisterns are patent. Vascular: No hyperdense vessel. Atherosclerotic calcifications are present within the cavernous internal carotid arteries. Skull: No acute fracture or focal lesion. Sinuses/Orbits: Paranasal sinuses and mastoid air cells are clear. The orbits are unremarkable. Other: None. IMPRESSION: No acute intracranial abnormality. Electronically Signed   By: Iven Finn M.D.   On: 05/28/2021 18:00   US Venous Img Lower Unilateral Left (DVT)  Result Date: 05/28/2021 CLINICAL DATA:  Recent fall, cellulitis, pain and swelling EXAM: LEFT LOWER EXTREMITY VENOUS DOPPLER ULTRASOUND TECHNIQUE: Gray-scale sonography with compression, as well as color and duplex ultrasound, were performed to evaluate the deep venous system(s) from the level of the common femoral vein  through the popliteal and proximal calf veins. COMPARISON:  None. FINDINGS: VENOUS Normal compressibility of the common femoral, superficial femoral, and popliteal veins, as well as the visualized calf veins. Visualized portions of profunda femoral vein and great saphenous vein unremarkable. No filling defects to suggest DVT on grayscale or color Doppler imaging. Doppler waveforms show normal direction of venous flow, normal respiratory plasticity and response to augmentation. Limited views of the contralateral common femoral vein are unremarkable. OTHER None. Limitations: none IMPRESSION: Negative examination for deep venous thrombosis in the left lower extremity. Electronically Signed   By: Eddie Candle M.D.   On: 05/28/2021 14:37   DG Chest Port 1 View  Result Date: 06/17/2021 CLINICAL DATA:  Atelectasis EXAM: PORTABLE CHEST 1 VIEW COMPARISON:  06/30/2021 FINDINGS: Lung apices are partially obscured. Chronic interstitial changes. Improved aeration at the right lung base. No  significant pleural effusion. Stable cardiomegaly. IMPRESSION: Improved aeration at the right lung base.  Otherwise unchanged. Electronically Signed   By: Macy Mis M.D.   On: 06/17/2021 13:18   DG Chest Port 1 View  Result Date: 06/08/2021 CLINICAL DATA:  Questionable sepsis - evaluate for abnormality new consolidation in the right lower EXAM: PORTABLE CHEST 1 VIEW COMPARISON:  05/28/2021. FINDINGS: New consolidation in the right lower lobe. Chronic interstitial prominence, slightly increased in conspicuity on the right. No visible pleural effusions or pneumothorax. Limited evaluation left lung apex to overlapping head/neck. Similar enlargement the cardiac silhouette. Calcific atherosclerosis aorta. Chronic right rib fractures. Chronic left proximal humerus fracture. IMPRESSION: 1. New consolidation in the right lower lobe, concerning for pneumonia. 2. Chronic interstitial prominence, slightly increased in conspicuity on the  right. This could represent chronic change, but infection or mild asymmetric edema are considerations. Electronically Signed   By: Margaretha Sheffield M.D.   On: 06/03/2021 12:40   DG Chest Port 1 View  Result Date: 05/28/2021 CLINICAL DATA:  Multiple falls, generalized weakness for about a week. Bilateral lower extremity edema. Is on oxygen at home. Hx of CHF, COPD. EXAM: PORTABLE CHEST 1 VIEW COMPARISON:  04/26/2021 FINDINGS: Mild enlargement of the cardiac silhouette, stable. No mediastinal or hilar masses. Prominent vascular markings similar to the prior study. Linear opacity at the left lung base consistent with atelectasis or scarring. No lung consolidation. No convincing edema. No visualized pleural effusion and no evidence of a pneumothorax. 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 IMPRESSION: 1. No acute cardiopulmonary disease. Electronically Signed   By: Lajean Manes M.D.   On: 05/28/2021 12:22   DG Knee Complete 4 Views Left  Result Date: 05/25/2021 CLINICAL DATA:  Fall.  Pain EXAM: LEFT KNEE - COMPLETE 4+ VIEW COMPARISON:  None. FINDINGS: There is diffuse soft tissue edema involving the anterior and lateral soft tissues. No underlying fracture or dislocation. Tiny joint effusion within the suprapatellar joint space. Patellofemoral and medial compartment narrowing. IMPRESSION: 1. No acute bone abnormality. 2. Soft tissue edema within the anterior and lateral soft tissues which may reflect contusion. 3. Tiny joint effusion. Electronically Signed   By: Kerby Moors M.D.   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  Total face to face encounter time for this patient visit was 25mn. >50% of the time was  spent in counseling and coordination of care.   Patient/Family are satisfied with care plan and all questions have been answered.  This document was prepared using Dragon voice recognition software and may include unintentional dictation errors.     FOttie Glazier M.D.  [facility]

## 2021-06-18 NOTE — Progress Notes (Signed)
PROGRESS NOTE    Paula Torres  TKP:[id number] DOB: 1950-09-07 DOA: 07/03/2021 PCP: Rusty Aus, MD    Brief Narrative:  70 y.o. female with medical history significant of dCHF, hyperlipidemia, COPD on 3-4 L oxygen, GERD, depression, PAF (was on Eliquis which is on hold due to recent leg wound bleeding), pulmonary hypertension, OSA on BiPAP, CAD, iron deficiency anemia, CKD-3A, who presents with AMS and SOB.   Per her daughter at the bedside, patient normally needs to wear BiPAP for OSA, but patient is fully compliant with BiPAP.  When she does not use BiPAP, she often becomes confused due to elevated CO2 level.  Today patient was found to be confused again.  She has some shortness of breath, no worsening cough, no fever or chills.  Patient has some left-sided chest pain.  No active nausea ,vomiting, diarrhea noted. When I saw pt in ED, she is confused, knows her own name, not oriented to place and time. Of note, patient was recently hospitalized from 9/25 to 10/6 due to left lower leg wound infection with surrounding cellulitis.  Patient was discharged on cefadroxil.    Patient initially had oxygen desaturated to 88% on home level 3 L oxygen, BiPAP was started in ED.  First ABG with pH 7.24, CO2 88, O2 32.  The repeated VBG after started BiPAP showed pH 7.34, CO2 79, O2 36.  Patient remains on BiPAP the following morning.  Lethargic but does awaken and answers questions appropriately.  10/12 refused bipap last night 10/13- only used bipap for very short period last night. This am lethargic, doesn't open her eyes. Was given xanax this am.  ABG ordered, co2 elevated, asked RT to place pt on bipap 10/14 awake today as pt used bipap last night per nursing. Had long time with pt's son who is present about her long term goals, especially if she is not going to be complaint with bipap. Discussed asking pccm who also knows her well to have discussion about this Tele afib rvr HR 130's. 10/15  patient was more compliant with BiPAP last night.  She is more awake alert and compensating this AM.   10/16 watching the football game. Denies sob, cp, dizziness.   Assessment & Plan:   Principal Problem:   HCAP (healthcare-associated pneumonia) Active Problems:   COPD with acute exacerbation (HCC)   Obesity, Class III, BMI 40-49.9 (morbid obesity) ([place])   Pulmonary hypertension (HCC)   Acute on chronic respiratory failure with hypoxia and hypercapnia (HCC)   Wound infection_left lower leg   Chronic diastolic CHF (congestive heart failure) (HCC)   Iron deficiency anemia   PAF (paroxysmal atrial fibrillation) (HCC)   OSA (obstructive sleep apnea)   Sepsis (HCC)   CAD (coronary artery disease)   Pressure injury of skin  Sepsis and acute on chronic respiratory failure with hypoxia and hypercapnia due to HCAP healthcare-associated pneumonia) and COPD with acute exacerbation ([place])  Patient meets criteria for sepsis with WBC 18.2, tachycardia with heart rate 93, tachypnea with RR 23.  Lactic acid is normal 1.0. Blood pressure soft, hemodynamically stable. 10/12 continue BiPAP Sepsis ruled out Procalcitonin negative Will DC IV antibiotics Discussed with patient importance of using her BiPAP especially at nighttime when she is asleep Continue Xanax prn Urine Legionella and strep antigen pending 10/13 abg with elevated co2 , likely why she is lethargic.  Have asked RT to place her BiPAP 10/16 more complaint with bipap now. 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   BNP 515, no pulm edema on chest x-ray, patient does not seem to have CHF exacerbation, but obviously at high risk for developing CHF exacerbation 10/16 continue to hold diuretics for now due to sepsis and low BP  continue to hold diuretics for now due to sepsis and low BP    PAF (paroxysmal atrial fibrillation) (HCC) Rate better controlled Continue Cardizem 60 every 6 hours Increase bisoprolol to 10 mg daily Continue Eliquis    Wound infection_left lower leg Wound care consult please see note     Iron deficiency anemia Continue on iron supplement      OSA (obstructive sleep apnea) Continue BiPAP have encouraged her to be more compliant   CAD (coronary artery disease) Continue Lipitor   PT rec. SNF  DVT prophylaxis: Eliquis Code Status: Full Family Communication: None at bedside  Disposition Plan: Status is: Inpatient  Remains inpatient appropriate because:Inpatient level of care appropriate due to severity of illness  Dispo: The patient is from: SNF              Anticipated d/c is to: SNF              Patient currently is not medically stable to d/c.   Difficult to place patient No       Level of care: Progressive Cardiac  Consultants:  None  Procedures:  None  Antimicrobials: Vancomycin Cefepime   Subjective: No shortness of breath, chest pain with abdominal pain  Objective: Vitals:   06/18/21 0424 06/18/21 0511 06/18/21 0642 06/18/21 0753  BP: 118/87  111/73 108/81  Pulse: 94   97  Resp: _0 Temp: 98.6 F (37 C)   98.2 F (36.8 C)  TempSrc:      SpO2: 98%   97%  Weight:  (!) 142.6 kg    Height:        Intake/Output Summary (Last 24 hours) at 06/18/2021 0849 Last data filed at 06/17/2021 2145 Gross per 24 hour  Intake 240 ml  Output 600 ml  Net -360 ml   Filed Weights   06/16/21 0500 06/17/21 0300 06/18/21 0511  Weight: (!) 142.3 kg (!)  142.7 kg (!) 142.6 kg    Examination: Watching football game Cta no w/r Reg-irreg s1/s2 no gallop Soft benign +bs No edema Aaxoxo2 not to date today   Data Reviewed: I have personally reviewed following labs and imaging studies  CBC: Recent Labs  Lab 06/14/21 0508 06/15/21 0454 06/16/21 0813 06/17/21 0652 06/18/21 0532  WBC 11.8* 10.2 11.0* 14.1* 10.0  NEUTROABS 11.3* 9.5* 9.4* 11.8* 8.6*  HGB 8.1* 8.7* 8.6* 9.1* 8.9*  HCT 27.2* 29.1* 29.0* 29.9* 30.8*  MCV 90.4 92.7 89.0 91.4 89.5  PLT 271 273 235 237 762   Basic Metabolic Panel: Recent Labs  Lab 06/14/21 0508 06/15/21 0454 06/16/21 0813 06/17/21 0652 06/18/21 0532  NA 136 137 140 140 137  K 4.4 4.3 4.7 4.7 4.9  CL 98 98 98 98 97*  CO2 33* 35* 34*  36* 35*  GLUCOSE 175* 151* 164* 133* 157*  BUN 32* 33* 34* 34* 33*  CREATININE 1.06* 0.88 0.92 0.84 0.76  CALCIUM 8.9 9.1 9.1 8.9 8.7*   GFR: Estimated Creatinine Clearance: 98.5 mL/min (by C-G formula based on SCr of 0.76 mg/dL). Liver Function Tests: Recent Labs  Lab 06/13/2021 1140  AST 12*  ALT 13  ALKPHOS 88  BILITOT 1.1  PROT 6.1*  ALBUMIN 3.3*   No results for input(s): LIPASE, AMYLASE in the last 168 hours. No results for input(s): AMMONIA in the last 168 hours.  Coagulation Profile: Recent Labs  Lab 06/19/2021 1140  INR 1.0   Cardiac Enzymes: No results for input(s): CKTOTAL, CKMB, CKMBINDEX, TROPONINI in the last 168 hours. BNP (last 3 results) No results for input(s): PROBNP in the last 8760 hours. HbA1C: No results for input(s): HGBA1C in the last 72 hours. CBG: Recent Labs  Lab 06/13/21 2207  GLUCAP 182*   Lipid Profile: No results for input(s): CHOL, HDL, LDLCALC, TRIG, CHOLHDL, LDLDIRECT in the last 72 hours. Thyroid Function Tests: No results for input(s): TSH, T4TOTAL, FREET4, T3FREE, THYROIDAB in the last 72 hours. Anemia Panel: No results for input(s): VITAMINB12, FOLATE, FERRITIN, TIBC, IRON, RETICCTPCT in the last 72  hours. Sepsis Labs: Recent Labs  Lab 06/15/2021 1140 06/13/21 0650  PROCALCITON <0.10  --   LATICACIDVEN 1.0 1.3    Recent Results (from the past 240 hour(s))  Resp Panel by RT-PCR (Flu A&B, Covid) Nasopharyngeal Swab     Status: None   Collection Time: 06/08/21  1:15 PM   Specimen: Nasopharyngeal Swab; Nasopharyngeal(NP) swabs in vial transport medium  Result Value Ref Range Status   SARS Coronavirus 2 by RT PCR NEGATIVE NEGATIVE Final    Comment: (NOTE) SARS-CoV-2 target nucleic acids are NOT DETECTED.  The SARS-CoV-2 RNA is generally detectable in upper respiratory specimens during the acute phase of infection. The lowest concentration of SARS-CoV-2 viral copies this assay can detect is 138 copies/mL. A negative result does not preclude SARS-Cov-2 infection and should not be used as the sole basis for treatment or other patient management decisions. A negative result may occur with  improper specimen collection/handling, submission of specimen other than nasopharyngeal swab, presence of viral mutation(s) within the areas targeted by this assay, and inadequate number of viral copies(<138 copies/mL). A negative result must be combined with clinical observations, patient history, and epidemiological information. The expected result is Negative.  Fact Sheet for Patients:  EntrepreneurPulse.com.au  Fact Sheet for Healthcare Providers:  IncredibleEmployment.be  This test is no t yet approved or cleared by the [place] FDA and  has been authorized for detection and/or diagnosis of SARS-CoV-2 by FDA under an Emergency Use Authorization (EUA). This EUA will remain  in effect (meaning this test can be used) for the duration of the COVID-19 declaration under Section 564(b)(1) of the Act, 21 U.S.C.section 360bbb-3(b)(1), unless the authorization is terminated  or revoked sooner.       Influenza A by PCR NEGATIVE NEGATIVE Final   Influenza  B by PCR NEGATIVE NEGATIVE Final    Comment: (NOTE) The Xpert Xpress SARS-CoV-2/FLU/RSV plus assay is intended as an aid in the diagnosis of influenza from Nasopharyngeal swab specimens and should not be used as a sole basis for treatment. Nasal washings and aspirates are unacceptable for Xpert Xpress SARS-CoV-2/FLU/RSV testing.  Fact Sheet for Patients: EntrepreneurPulse.com.au  Fact Sheet for Healthcare Providers: IncredibleEmployment.be  This test is not yet approved or cleared by the [place]  States FDA and has been authorized for detection and/or diagnosis of SARS-CoV-2 by FDA under an Emergency Use Authorization (EUA). This EUA will remain in effect (meaning this test can be used) for the duration of the COVID-19 declaration under Section 564(b)(1) of the Act, 21 U.S.C. section 360bbb-3(b)(1), unless the authorization is terminated or revoked.  Performed at [facility], [address] [place], [place] [address]   Blood Culture (routine x 2)     Status: None   Collection Time: 06/26/2021 11:40 AM   Specimen: BLOOD  Result Value Ref Range Status   Specimen Description BLOOD RIGHT ANTECUBITAL  Final   Special Requests   Final    BOTTLES DRAWN AEROBIC AND ANAEROBIC Blood Culture adequate volume   Culture   Final    NO GROWTH 5 DAYS Performed at [facility], [address] [place], [place] [address]    Report Status 06/17/2021 FINAL  Final  Resp Panel by RT-PCR (Flu A&B, Covid) Nasopharyngeal Swab     Status: None   Collection Time: 06/13/2021  3:08 PM   Specimen: Nasopharyngeal Swab; Nasopharyngeal(NP) swabs in vial transport medium  Result Value Ref Range Status   SARS Coronavirus 2 by RT PCR NEGATIVE NEGATIVE Final    Comment: (NOTE) SARS-CoV-2 target nucleic acids are NOT DETECTED.  The SARS-CoV-2 RNA is generally detectable in upper respiratory specimens during the acute phase of infection. The  lowest concentration of SARS-CoV-2 viral copies this assay can detect is 138 copies/mL. A negative result does not preclude SARS-Cov-2 infection and should not be used as the sole basis for treatment or other patient management decisions. A negative result may occur with  improper specimen collection/handling, submission of specimen other than nasopharyngeal swab, presence of viral mutation(s) within the areas targeted by this assay, and inadequate number of viral copies(<138 copies/mL). A negative result must be combined with clinical observations, patient history, and epidemiological information. The expected result is Negative.  Fact Sheet for Patients:  EntrepreneurPulse.com.au  Fact Sheet for Healthcare Providers:  IncredibleEmployment.be  This test is no t yet approved or cleared by the [place] FDA and  has been authorized for detection and/or diagnosis of SARS-CoV-2 by FDA under an Emergency Use Authorization (EUA). This EUA will remain  in effect (meaning this test can be used) for the duration of the COVID-19 declaration under Section 564(b)(1) of the Act, 21 U.S.C.section 360bbb-3(b)(1), unless the authorization is terminated  or revoked sooner.       Influenza A by PCR NEGATIVE NEGATIVE Final   Influenza B by PCR NEGATIVE NEGATIVE Final    Comment: (NOTE) The Xpert Xpress SARS-CoV-2/FLU/RSV plus assay is intended as an aid in the diagnosis of influenza from Nasopharyngeal swab specimens and should not be used as a sole basis for treatment. Nasal washings and aspirates are unacceptable for Xpert Xpress SARS-CoV-2/FLU/RSV testing.  Fact Sheet for Patients: EntrepreneurPulse.com.au  Fact Sheet for Healthcare Providers: IncredibleEmployment.be  This test is not yet approved or cleared by the [place] FDA and has been authorized for detection and/or diagnosis of SARS-CoV-2 by FDA under  an Emergency Use Authorization (EUA). This EUA will remain in effect (meaning this test can be used) for the duration of the COVID-19 declaration under Section 564(b)(1) of the Act, 21 U.S.C. section 360bbb-3(b)(1), unless the authorization is terminated or revoked.  Performed at [facility], [address] [place], [place] [address]   Culture, blood (Routine X 2) w Reflex to ID Panel  Status: None   Collection Time: 06/13/21  6:50 AM   Specimen: BLOOD  Result Value Ref Range Status   Specimen Description BLOOD RIGHT HAND  Final   Special Requests   Final    BOTTLES DRAWN AEROBIC ONLY Blood Culture results may not be optimal due to an inadequate volume of blood received in culture bottles   Culture   Final    NO GROWTH 5 DAYS Performed at [facility], [address], [place], [place] [address]    Report Status 06/18/2021 FINAL  Final  MRSA Next Gen by PCR, Nasal     Status: None   Collection Time: 06/14/21  9:00 AM   Specimen: Nasal Mucosa; Nasal Swab  Result Value Ref Range Status   MRSA by PCR Next Gen NOT DETECTED NOT DETECTED Final    Comment: (NOTE) The GeneXpert MRSA Assay (FDA approved for NASAL specimens only), is one component of a comprehensive MRSA colonization surveillance program. It is not intended to diagnose MRSA infection nor to guide or monitor treatment for MRSA infections. Test performance is not FDA approved in patients less than 88 years old. Performed at [facility], [address] [place], [place] [address]          Radiology Studies: DG Chest [place] 1 View  Result Date: 06/17/2021 CLINICAL DATA:  Atelectasis EXAM: PORTABLE CHEST 1 VIEW COMPARISON:  06/30/2021 FINDINGS: Lung apices are partially obscured. Chronic interstitial changes. Improved aeration at the right lung base. No significant pleural effusion. Stable cardiomegaly. IMPRESSION: Improved aeration at the right lung base.  Otherwise unchanged.  Electronically Signed   By: Macy Mis M.D.   On: 06/17/2021 13:18        Scheduled Meds:  apixaban  5 mg Oral BID   atorvastatin  20 mg Oral QHS   bisoprolol  10 mg Oral Daily   collagenase   Topical Daily   diltiazem  60 mg Oral Q6H   famotidine  20 mg Oral QHS   ferrous gluconate  324 mg Oral Weekly   folic acid  1 mg Oral Daily   ipratropium  0.5 mg Nebulization TID   levalbuterol  1.25 mg Nebulization TID   loratadine  10 mg Oral Daily   midodrine  10 mg Oral TID WC   montelukast  10 mg Oral QHS   predniSONE  50 mg Oral Q breakfast   Treprostinil Diolamine ER  0.25 mg Oral BID   Continuous Infusions:     LOS: 6 days    Time spent: 35 minutes with more than 50% on [facility], MD [facility]   If 7PM-7AM, please contact night-coverage  06/18/2021, 8:49 AM

## 2021-06-19 LAB — CBC WITH DIFFERENTIAL/PLATELET
Abs Immature Granulocytes: 0.05 10*3/uL (ref 0.00–0.07)
Basophils Absolute: 0 10*3/uL (ref 0.0–0.1)
Basophils Relative: 0 %
Eosinophils Absolute: 0 10*3/uL (ref 0.0–0.5)
Eosinophils Relative: 0 %
HCT: 30.3 % — ABNORMAL LOW (ref 36.0–46.0)
Hemoglobin: 9.2 g/dL — ABNORMAL LOW (ref 12.0–15.0)
Immature Granulocytes: 0 %
Lymphocytes Relative: 9 %
Lymphs Abs: 1.1 10*3/uL (ref 0.7–4.0)
MCH: 26.9 pg (ref 26.0–34.0)
MCHC: 30.4 g/dL (ref 30.0–36.0)
MCV: 88.6 fL (ref 80.0–100.0)
Monocytes Absolute: 0.9 10*3/uL (ref 0.1–1.0)
Monocytes Relative: 7 %
Neutro Abs: 10 10*3/uL — ABNORMAL HIGH (ref 1.7–7.7)
Neutrophils Relative %: 84 %
Platelets: 252 10*3/uL (ref 150–400)
RBC: 3.42 MIL/uL — ABNORMAL LOW (ref 3.87–5.11)
RDW: 18 % — ABNORMAL HIGH (ref 11.5–15.5)
WBC: 12.1 10*3/uL — ABNORMAL HIGH (ref 4.0–10.5)
nRBC: 0 % (ref 0.0–0.2)

## 2021-06-19 LAB — BLOOD GAS, VENOUS
Acid-Base Excess: 11.6 mmol/L — ABNORMAL HIGH (ref 0.0–2.0)
Bicarbonate: 38 mmol/L — ABNORMAL HIGH (ref 20.0–28.0)
O2 Saturation: 97.7 %
Patient temperature: 37
pCO2, Ven: 60 mmHg (ref 44.0–60.0)
pH, Ven: 7.41 (ref 7.250–7.430)
pO2, Ven: 98 mmHg — ABNORMAL HIGH (ref 32.0–45.0)

## 2021-06-19 LAB — BLOOD GAS, ARTERIAL
Acid-Base Excess: 12.3 mmol/L — ABNORMAL HIGH (ref 0.0–2.0)
Bicarbonate: 39 mmol/L — ABNORMAL HIGH (ref 20.0–28.0)
FIO2: 0.36
O2 Saturation: 87.6 %
Patient temperature: 37
pCO2 arterial: 63 mmHg — ABNORMAL HIGH (ref 32.0–48.0)
pH, Arterial: 7.4 (ref 7.350–7.450)
pO2, Arterial: 54 mmHg — ABNORMAL LOW (ref 83.0–108.0)

## 2021-06-19 LAB — POTASSIUM: Potassium: 4.6 mmol/L (ref 3.5–5.1)

## 2021-06-19 LAB — BASIC METABOLIC PANEL
Anion gap: 8 (ref 5–15)
BUN: 33 mg/dL — ABNORMAL HIGH (ref 8–23)
CO2: 34 mmol/L — ABNORMAL HIGH (ref 22–32)
Calcium: 8.9 mg/dL (ref 8.9–10.3)
Chloride: 95 mmol/L — ABNORMAL LOW (ref 98–111)
Creatinine, Ser: 0.78 mg/dL (ref 0.44–1.00)
GFR, Estimated: >60 mL/min (ref >60)
Glucose, Bld: 117 mg/dL — ABNORMAL HIGH (ref 70–99)
Potassium: 4.7 mmol/L (ref 3.5–5.1)
Sodium: 137 mmol/L (ref 135–145)

## 2021-06-19 LAB — RESP PANEL BY RT-PCR (FLU A&B, COVID) ARPGX2
Influenza A by PCR: NEGATIVE
Influenza B by PCR: NEGATIVE
SARS Coronavirus 2 by RT PCR: POSITIVE — AB

## 2021-06-19 MED ORDER — SODIUM CHLORIDE 0.9 % IV SOLN
100.0000 mg | Freq: Every day | INTRAVENOUS | Status: DC
  Administered 2021-06-20 – 2021-06-22 (×3): 100 mg via INTRAVENOUS
  Filled 2021-06-19 (×2): qty 100
  Filled 2021-06-19: qty 20
  Filled 2021-06-19: qty 100

## 2021-06-19 MED ORDER — IPRATROPIUM BROMIDE HFA 17 MCG/ACT IN AERS
2.0000 | INHALATION_SPRAY | Freq: Four times a day (QID) | RESPIRATORY_TRACT | Status: DC
  Administered 2021-06-19 (×2): 2 via RESPIRATORY_TRACT
  Filled 2021-06-19: qty 12.9

## 2021-06-19 MED ORDER — LEVALBUTEROL TARTRATE 45 MCG/ACT IN AERO
2.0000 | INHALATION_SPRAY | Freq: Three times a day (TID) | RESPIRATORY_TRACT | Status: DC
  Administered 2021-06-20: 2 via RESPIRATORY_TRACT
  Filled 2021-06-19 (×2): qty 15

## 2021-06-19 MED ORDER — LEVALBUTEROL TARTRATE 45 MCG/ACT IN AERO
1.0000 | INHALATION_SPRAY | Freq: Four times a day (QID) | RESPIRATORY_TRACT | Status: DC | PRN
  Filled 2021-06-19: qty 15

## 2021-06-19 MED ORDER — SODIUM CHLORIDE 0.9 % IV SOLN
200.0000 mg | Freq: Once | INTRAVENOUS | Status: AC
  Administered 2021-06-19: 200 mg via INTRAVENOUS
  Filled 2021-06-19: qty 40

## 2021-06-19 NOTE — Progress Notes (Addendum)
Physical Therapy Treatment Patient Details Name: Paula Torres MRN: [id number] DOB: 13-Oct-1950 Today's Date: 06/19/2021   History of Present Illness Paula Torres is a 70 y.o. female with medical history significant of dCHF, hyperlipidemia, COPD on 3-4 L oxygen, GERD, depression, PAF (was on Eliquis which is on hold due to recent leg wound bleeding), pulmonary hypertension, OSA on BiPAP, CAD, iron deficiency anemia, CKD-3A, who presents with AMS and SOB. Recently discharged from here and sent to skilled nursing.    PT Comments    Pt stated she was feeling poorly today.  She participated in very minimal supine ex mostly toe taps and ab/add.  She did not appear to be feeling well and not putting in much effort for ex.  Further session deferred at this time. RN aware.   Recommendations for follow up therapy are one component of a multi-disciplinary discharge planning process, led by the attending physician.  Recommendations may be updated based on patient status, additional functional criteria and insurance authorization.  Follow Up Recommendations  SNF;Supervision for mobility/OOB     Equipment Recommendations  None recommended by PT;Other (comment)    Recommendations for Other Services       Precautions / Restrictions Precautions Precautions: Fall Precaution Comments: monitor O2 sats, HR Restrictions Weight Bearing Restrictions: No Other Position/Activity Restrictions: BLE L>R wounds     Mobility  Bed Mobility               General bed mobility comments: deferred    Transfers                    Ambulation/Gait                 Stairs             Wheelchair Mobility    Modified Rankin (Stroke Patients Only)       Balance                                            Cognition Arousal/Alertness: Awake/alert Behavior During Therapy: Flat affect Overall Cognitive Status: Within Functional Limits for tasks assessed                                         Exercises Other Exercises Other Exercises: very limited supine ex    General Comments        Pertinent Vitals/Pain Pain Assessment: No/denies pain    Home Living                      Prior Function            PT Goals (current goals can now be found in the care plan section) Progress towards PT goals: Not progressing toward goals - comment    Frequency    Min 2X/week      PT Plan Current plan remains appropriate    Co-evaluation              AM-PAC PT "6 Clicks" Mobility   Outcome Measure  Help needed turning from your back to your side while in a flat bed without using bedrails?: A Lot Help needed moving from lying on your back to sitting on the side of a flat bed without using bedrails?: A  Lot Help needed moving to and from a bed to a chair (including a wheelchair)?: Total Help needed standing up from a chair using your arms (e.g., wheelchair or bedside chair)?: A Lot Help needed to walk in hospital room?: Total Help needed climbing 3-5 steps with a railing? : Total 6 Click Score: 9    End of Session Equipment Utilized During Treatment: Oxygen Activity Tolerance: Patient limited by fatigue Patient left: in bed;with call bell/phone within reach;with bed alarm set;with family/visitor present Nurse Communication: Other (comment) PT Visit Diagnosis: Unsteadiness on feet (R26.81);Other abnormalities of gait and mobility (R26.89);Repeated falls (R29.6);Muscle weakness (generalized) (M62.81);Difficulty in walking, not elsewhere classified (R26.2) Pain - Right/Left: Left Pain - part of body: Knee;Leg     Time: 0254-2706 PT Time Calculation (min) (ACUTE ONLY): 13 min  Charges:  $Therapeutic Exercise: 8-22 mins                    Chesley Noon, PTA 06/19/21, 1:04 PM

## 2021-06-19 NOTE — Progress Notes (Signed)
PROGRESS NOTE    Malayjah Otoole  EOF:[id number] DOB: 1951-06-11 DOA: 06/07/2021 PCP: Rusty Aus, MD    Brief Narrative:  70 y.o. female with medical history significant of dCHF, hyperlipidemia, COPD on 3-4 L oxygen, GERD, depression, PAF (was on Eliquis which is on hold due to recent leg wound bleeding), pulmonary hypertension, OSA on BiPAP, CAD, iron deficiency anemia, CKD-3A, who presents with AMS and SOB.   Per her daughter at the bedside, patient normally needs to wear BiPAP for OSA, but patient is fully compliant with BiPAP.  When she does not use BiPAP, she often becomes confused due to elevated CO2 level.  Today patient was found to be confused again.  She has some shortness of breath, no worsening cough, no fever or chills.  Patient has some left-sided chest pain.  No active nausea ,vomiting, diarrhea noted. When I saw pt in ED, she is confused, knows her own name, not oriented to place and time. Of note, patient was recently hospitalized from 9/25 to 10/6 due to left lower leg wound infection with surrounding cellulitis.  Patient was discharged on cefadroxil.    Patient initially had oxygen desaturated to 88% on home level 3 L oxygen, BiPAP was started in ED.  First ABG with pH 7.24, CO2 88, O2 32.  The repeated VBG after started BiPAP showed pH 7.34, CO2 79, O2 36.  Patient remains on BiPAP the following morning.  Lethargic but does awaken and answers questions appropriately.  10/12 refused bipap last night 10/13- only used bipap for very short period last night. This am lethargic, doesn't open her eyes. Was given xanax this am.  ABG ordered, co2 elevated, asked RT to place pt on bipap 10/14 awake today as pt used bipap last night per nursing. Had long time with pt's son who is present about her long term goals, especially if she is not going to be complaint with bipap. Discussed asking pccm who also knows her well to have discussion about this Tele afib rvr HR 130's. 10/15  patient was more compliant with BiPAP last night.  She is more awake alert and compensating this AM.   10/16 watching the football game. Denies sob, cp, dizziness.  10/17 was told by nursing that daughter was found to be COVID-positive.  I send off for COVID PCR which returned positive.  PCCM and RT and nursing were all notified.  Duo nebs were discontinued.  Family is aware.  Assessment & Plan:   Principal Problem:   HCAP (healthcare-associated pneumonia) Active Problems:   COPD with acute exacerbation (HCC)   Obesity, Class III, BMI 40-49.9 (morbid obesity) ([place])   Pulmonary hypertension (HCC)   Acute on chronic respiratory failure with hypoxia and hypercapnia (HCC)   Wound infection_left lower leg   Chronic diastolic CHF (congestive heart failure) (HCC)   Iron deficiency anemia   PAF (paroxysmal atrial fibrillation) (HCC)   OSA (obstructive sleep apnea)   Sepsis (HCC)   CAD (coronary artery disease)   Pressure injury of skin  Sepsis and acute on chronic respiratory failure with hypoxia and hypercapnia due to HCAP healthcare-associated pneumonia) and COPD with acute exacerbation ([place])  Patient meets criteria for sepsis with WBC 18.2, tachycardia with heart rate 93, tachypnea with RR 23.  Lactic acid is normal 1.0. Blood pressure soft, hemodynamically stable. 10/12 continue BiPAP Sepsis ruled out Procalcitonin negative Will DC IV antibiotics Discussed with patient importance of using her BiPAP especially at nighttime when she is asleep Continue Xanax prn  Urine Legionella and strep antigen pending 10/13 abg with elevated co2 , likely why she is lethargic.  Have asked RT to place her BiPAP 10/17 appears to be more compliant with BiPAP. will discontinue nebs since she has tested positive for COVID Continue inhalers    Covid + With her underlying lung issues as above will start remdesivir.  I discussed this with her son who has verbally given me consent to initiate the  medication. 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   BNP 515, no pulm edema on chest x-ray, patient does not seem to have CHF exacerbation, but obviously at high risk for developing CHF exacerbation 10/17 monitor volume status May need to use Lasix as needed if needed   PAF (paroxysmal atrial fibrillation) (HCC) Rate better controlled Continue Cardizem 60 every 6 hours Increase bisoprolol to 10 mg daily Continue Eliquis    Wound infection_left lower leg Wound care consult please see note     Iron deficiency anemia Continue on iron supplement      OSA (obstructive sleep apnea) Continue BiPAP have encouraged her to be more compliant   CAD (coronary artery disease) Continue Lipitor   PT rec. SNF  DVT prophylaxis: Eliquis Code Status: Full Family Communication: None at bedside  Disposition Plan: Status is: Inpatient  Remains inpatient appropriate because:Inpatient level of care appropriate due to severity of illness  Dispo: The patient is from: SNF              Anticipated d/c is to: SNF              Patient currently is not medically stable to d/c.   Difficult to place patient No       Level of care: Progressive Cardiac  Consultants:  None  Procedures:  None  Antimicrobials: Vancomycin Cefepime   Subjective: Tells me she is on in the mood to talk this AM.  Pursed breathing through her mouth.  Objective: Vitals:   06/19/21 0120 06/19/21 0332 06/19/21 0515 06/19/21 0755  BP:   117/65 125/70  Pulse:    97   Resp:   20 20  Temp:   98.1 F (36.7 C)   TempSrc:   Axillary   SpO2: 92% 94% 97% 94%  Weight:   (!) 142.7 kg   Height:        Intake/Output Summary (Last 24 hours) at 06/19/2021 1448 Last data filed at 06/19/2021 0537 Gross per 24 hour  Intake 120 ml  Output 1300 ml  Net -1180 ml   Filed Weights   06/17/21 0300 06/18/21 0511 06/19/21 0515  Weight: (!) 142.7 kg (!) 142.6 kg (!) 142.7 kg    Examination: NAD, calm somewhat foul mood this AM CTA no wheezing Irregular S1-S2 no gallops Soft benign positive bowel sounds No edema Mood and follow   Data Reviewed: I have personally reviewed following labs and imaging studies  CBC: Recent Labs  Lab 06/15/21 0454 06/16/21 0813 06/17/21 0652 06/18/21 0532 06/19/21 0518  WBC 10.2 11.0* 14.1* 10.0 12.1*  NEUTROABS 9.5* 9.4* 11.8* 8.6* 10.0*  HGB 8.7* 8.6* 9.1* 8.9* 9.2*  HCT 29.1* 29.0* 29.9* 30.8* 30.3*  MCV 92.7 89.0 91.4 89.5 88.6  PLT 273 235 237 243 245   Basic Metabolic Panel: Recent Labs  Lab 06/15/21 0454 06/16/21 0813 06/17/21 0652 06/18/21 0532 06/19/21 0518 06/19/21 0750  NA 137 140 140 137  --  137  K 4.3 4.7 4.7 4.9 4.6 4.7  CL 98 98 98 97*  --  95*  CO2 35* 34* 36* 35*  --  34*  GLUCOSE 151* 164* 133* 157*  --  117*  BUN 33* 34* 34* 33*  --  33*  CREATININE 0.88 0.92 0.84 0.76  --  0.78  CALCIUM 9.1 9.1 8.9 8.7*  --  8.9   GFR: Estimated Creatinine Clearance: 98.5 mL/min (by C-G formula based on SCr of 0.78 mg/dL). Liver Function Tests: No results for input(s): AST, ALT, ALKPHOS, BILITOT, PROT, ALBUMIN in the last 168 hours.  No results for input(s): LIPASE, AMYLASE in the last 168 hours. No results for input(s): AMMONIA in the last 168 hours.  Coagulation Profile: No results for input(s): INR, PROTIME in the last 168 hours.  Cardiac Enzymes: No results for input(s): CKTOTAL, CKMB, CKMBINDEX, TROPONINI in the last 168 hours. BNP (last 3 results) No results for input(s): PROBNP in the  last 8760 hours. HbA1C: No results for input(s): HGBA1C in the last 72 hours. CBG: Recent Labs  Lab 06/13/21 2207  GLUCAP 182*   Lipid Profile: No results for input(s): CHOL, HDL, LDLCALC, TRIG, CHOLHDL, LDLDIRECT in the last 72 hours. Thyroid Function Tests: No results for input(s): TSH, T4TOTAL, FREET4, T3FREE, THYROIDAB in the last 72 hours. Anemia Panel: No results for input(s): VITAMINB12, FOLATE, FERRITIN, TIBC, IRON, RETICCTPCT in the last 72 hours. Sepsis Labs: Recent Labs  Lab 06/13/21 0650  LATICACIDVEN 1.3    Recent Results (from the past 240 hour(s))  Blood Culture (routine x 2)     Status: None   Collection Time: 06/18/2021 11:40 AM   Specimen: BLOOD  Result Value Ref Range Status   Specimen Description BLOOD RIGHT ANTECUBITAL  Final   Special Requests   Final    BOTTLES DRAWN AEROBIC AND ANAEROBIC Blood Culture adequate volume   Culture   Final    NO GROWTH 5 DAYS Performed at [facility], [address] [place], [place] [address]    Report Status 06/17/2021 FINAL  Final  Resp Panel by RT-PCR (Flu A&B, Covid) Nasopharyngeal Swab     Status: None   Collection Time: 06/07/2021  3:08 PM   Specimen: Nasopharyngeal Swab; Nasopharyngeal(NP) swabs in vial transport medium  Result Value Ref Range Status   SARS Coronavirus 2 by RT PCR NEGATIVE NEGATIVE Final    Comment: (NOTE) SARS-CoV-2 target nucleic acids are NOT DETECTED.  The SARS-CoV-2 RNA is generally detectable in upper respiratory specimens during the acute phase of infection. The lowest concentration of SARS-CoV-2 viral copies this assay can detect is 138 copies/mL. A negative result does not preclude SARS-Cov-2 infection and should not be used as the sole basis for treatment or other patient management decisions. A negative result may occur with  improper specimen collection/handling, submission of specimen other than nasopharyngeal swab, presence of viral mutation(s) within the areas  targeted by this assay, and inadequate number of viral copies(<138 copies/mL). A negative result must be combined with clinical observations, patient history, and epidemiological information. The expected result is Negative.  Fact Sheet for Patients:  EntrepreneurPulse.com.au  Fact Sheet for Healthcare  Providers:  IncredibleEmployment.be  This test is no t yet approved or cleared by the [place] and  has been authorized for detection and/or diagnosis of SARS-CoV-2 by FDA under an Emergency Use Authorization (EUA). This EUA will remain  in effect (meaning this test can be used) for the duration of the COVID-19 declaration under Section 564(b)(1) of the Act, 21 U.S.C.section 360bbb-3(b)(1), unless the authorization is terminated  or revoked sooner.       Influenza A by PCR NEGATIVE NEGATIVE Final   Influenza B by PCR NEGATIVE NEGATIVE Final    Comment: (NOTE) The Xpert Xpress SARS-CoV-2/FLU/RSV plus assay is intended as an aid in the diagnosis of influenza from Nasopharyngeal swab specimens and should not be used as a sole basis for treatment. Nasal washings and aspirates are unacceptable for Xpert Xpress SARS-CoV-2/FLU/RSV testing.  Fact Sheet for Patients: EntrepreneurPulse.com.au  Fact Sheet for Healthcare Providers: IncredibleEmployment.be  This test is not yet approved or cleared by the [place] FDA and has been authorized for detection and/or diagnosis of SARS-CoV-2 by FDA under an Emergency Use Authorization (EUA). This EUA will remain in effect (meaning this test can be used) for the duration of the COVID-19 declaration under Section 564(b)(1) of the Act, 21 U.S.C. section 360bbb-3(b)(1), unless the authorization is terminated or revoked.  Performed at [facility], [address] [place], [place] [address]   Culture, blood (Routine X 2) w Reflex to ID Panel     Status:  None   Collection Time: 06/13/21  6:50 AM   Specimen: BLOOD  Result Value Ref Range Status   Specimen Description BLOOD RIGHT HAND  Final   Special Requests   Final    BOTTLES DRAWN AEROBIC ONLY Blood Culture results may not be optimal due to an inadequate volume of blood received in culture bottles   Culture   Final    NO GROWTH 5 DAYS Performed at [facility], [address], [place], [place] [address]    Report Status 06/18/2021 FINAL  Final  MRSA Next Gen by PCR, Nasal     Status: None   Collection Time: 06/14/21  9:00 AM   Specimen: Nasal Mucosa; Nasal Swab  Result Value Ref Range Status   MRSA by PCR Next Gen NOT DETECTED NOT DETECTED Final    Comment: (NOTE) The GeneXpert MRSA Assay (FDA approved for NASAL specimens only), is one component of a comprehensive MRSA colonization surveillance program. It is not intended to diagnose MRSA infection nor to guide or monitor treatment for MRSA infections. Test performance is not FDA approved in patients less than 39 years old. Performed at [facility], [address] [place], [place] [address]   Resp Panel by RT-PCR (Flu A&B, Covid) Nasopharyngeal Swab     Status: Abnormal   Collection Time: 06/19/21 11:00 AM   Specimen: Nasopharyngeal Swab; Nasopharyngeal(NP) swabs in vial transport medium  Result Value Ref Range Status   SARS Coronavirus 2 by RT PCR POSITIVE (A) NEGATIVE Final    Comment: RESULT CALLED TO, READ BACK BY AND VERIFIED WITH: C/JAMIE EANES 06/19/21 1226 AMK (NOTE) SARS-CoV-2 target nucleic acids are DETECTED.  The SARS-CoV-2 RNA is generally detectable in upper respiratory specimens during the acute phase of infection. Positive results are indicative of the presence of the identified virus, but do not rule out bacterial infection or co-infection with other pathogens not detected by the test. Clinical correlation with patient history and other diagnostic information is necessary to determine  patient infection status. The expected result is Negative.  Fact Sheet for Patients: EntrepreneurPulse.com.au  Fact Sheet for Healthcare Providers: IncredibleEmployment.be  This test is not yet approved or cleared by the [place] FDA and  has been authorized for detection and/or diagnosis of SARS-CoV-2 by FDA under an Emergency Use Authorization (EUA).  This EUA will remain in effect (meaning this test can be u sed) for the duration of  the COVID-19 declaration under Section 564(b)(1) of the Act, 21 U.S.C. section 360bbb-3(b)(1), unless the authorization is terminated or revoked sooner.     Influenza A by PCR NEGATIVE NEGATIVE Final   Influenza B by PCR NEGATIVE NEGATIVE Final    Comment: (NOTE) The Xpert Xpress SARS-CoV-2/FLU/RSV plus assay is intended as an aid in the diagnosis of influenza from Nasopharyngeal swab specimens and should not be used as a sole basis for treatment. Nasal washings and aspirates are unacceptable for Xpert Xpress SARS-CoV-2/FLU/RSV testing.  Fact Sheet for Patients: EntrepreneurPulse.com.au  Fact Sheet for Healthcare Providers: IncredibleEmployment.be  This test is not yet approved or cleared by the [place] FDA and has been authorized for detection and/or diagnosis of SARS-CoV-2 by FDA under an Emergency Use Authorization (EUA). This EUA will remain in effect (meaning this test can be used) for the duration of the COVID-19 declaration under Section 564(b)(1) of the Act, 21 U.S.C. section 360bbb-3(b)(1), unless the authorization is terminated or revoked.  Performed at [facility], [address] [place], [place] [address]          Radiology Studies: No results found.      Scheduled Meds:  apixaban  5 mg Oral BID   atorvastatin  20 mg Oral QHS   bisoprolol  10 mg Oral Daily   collagenase   Topical Daily   diltiazem  90 mg Oral Q6H    famotidine  20 mg Oral QHS   ferrous gluconate  324 mg Oral Weekly   folic acid  1 mg Oral Daily   ipratropium  0.5 mg Nebulization TID   levalbuterol  1.25 mg Nebulization TID   loratadine  10 mg Oral Daily   midodrine  10 mg Oral TID WC   montelukast  10 mg Oral QHS   predniSONE  50 mg Oral Q breakfast   Treprostinil Diolamine ER  0.25 mg Oral BID   Continuous Infusions:     LOS: 7 days    Time spent: 45 minutes with more than 50% on [place], MD [facility]   If 7PM-7AM, please contact night-coverage  06/19/2021, 2:48 PM

## 2021-06-19 NOTE — Progress Notes (Signed)
OT Cancellation Note  Patient Details Name: Paula Torres MRN: [id number] DOB: 07-Mar-1951   Cancelled Treatment:    Reason Eval/Treat Not Completed: Other (comment). Pt has now tested positive for covid. OT calling into the room and speaking with son who reports pt is feeling unwell and very lethargic. He requests pt rest today and OT re-attempt when pt is able to actively participate.   Darleen Crocker, MS, OTR/L , CBIS ascom [phone]  06/19/21, 2:09 PM

## 2021-06-19 NOTE — Consult Note (Signed)
Remdesivir - Pharmacy Brief Note   O:  ALT: 13 CXR: Concerning for pneumonia SpO2: Hypoxic requiring supplemental oxygen   A/P:  07/03/2021 SARS-CoV-2 PCR (-) 06/19/21 SARS-CoV-2 PCR (+)  Remdesivir 200 mg IVPB once followed by 100 mg IVPB daily x 4 days.   Benita Gutter  06/19/2021 3:22 PM

## 2021-06-19 NOTE — Progress Notes (Signed)
Pulmonary Medicine          Date: 06/19/2021,   MRN# [id number] Paula Torres [birth year]     AdmissionWeight: (!) 140 kg                 CurrentWeight: (!) 142.7 kg   Referring physician: Dr. Francine Graven   CHIEF COMPLAINT:   Acute on chronic hypercapnic and hypoxemic respiratory failure   HISTORY OF PRESENT ILLNESS   Is a 70 year old female with a history of diastolic CHF as well as COPD and morbid obesity with pickwickian syndrome and obesity hypoventilation with chronic hypercapnia.  She also has a history of pulmonary hypertension, obstructive sleep apnea atrial fibrillation and essential hypertension.  She came into the hospital with complaints of acute onset dyspnea with hypoxemia.  Daughter was able to respond and was able to increase oxygen therapy as well as call EMS due to respiratory failure.  She was placed on BiPAP during my evaluation.   At baseline patient uses 4 L/min nasal cannula.  Pulmonary consultation for further evaluation and management of complex comorbid pulmonary patient.  I spoke with son Paula Torres and daughter at bedside.  We discussed importance of BIPAP and or high risk of cardiac arrest due to respiratory failure.   06/17/21- patient with improved mentation post BIPAP 6h trial overnight.  We discussed importance of compliance.  She is on 4L/min [place].   There is VBG planned for today.  Repeat CXR today.  PT/OT today.   06/18/21-  patient with intermittent confused.  Family with covid+  06/19/21- Patient resting in bed. No acute events overnight patient on BIPAP. She has episodes of confusion. She was unable to get her pulmonary HTN meds and is [facility]. Family are sick with COVID. VBG this am  PAST MEDICAL HISTORY   Past Medical History:  Diagnosis Date   (HFpEF) heart failure with preserved ejection fraction (HCC)    Chronic respiratory failure with hypoxia (HCC)    COPD (chronic obstructive pulmonary  disease) (HCC)    Coronary artery disease, non-occlusive    Hypertension    OSA (obstructive sleep apnea)    PAF (paroxysmal atrial fibrillation) ([place])    Pulmonary hypertension ([place])      SURGICAL HISTORY   Past Surgical History:  Procedure Laterality Date   NO PAST SURGERIES     RIGHT/LEFT HEART CATH AND CORONARY ANGIOGRAPHY Left 12/21/2019   Procedure: RIGHT/LEFT HEART CATH AND CORONARY ANGIOGRAPHY;  Surgeon: Wellington Hampshire, MD;  Location: [facility];  Service: Cardiovascular;  Laterality: Left;     FAMILY HISTORY   Family History  Problem Relation Age of Onset   Hypertension Other      SOCIAL HISTORY   Social History   Tobacco Use   Smoking status: Former    Types: Cigarettes    Quit date: 06/03/2017    Years since quitting: 4.0   Smokeless tobacco: Never  Vaping Use   Vaping Use: Never used  Substance Use Topics   Alcohol use: Not Currently   Drug use: Never     MEDICATIONS    Home Medication:  REM   Current Medication:  Current Facility-Administered Medications:    acetaminophen (TYLENOL) tablet 650 mg, 650 mg, Oral, Q6H PRN, Ivor Costa, MD   apixaban (ELIQUIS) tablet 5 mg, 5 mg, Oral, BID, Deal, Justice Britain, RPH, 5 mg at 06/18/21 2257   atorvastatin (LIPITOR) tablet 20 mg, 20 mg, Oral, QHS,  Ivor Costa, MD, 20 mg at 06/18/21 2257   bisoprolol (ZEBETA) tablet 10 mg, 10 mg, Oral, Daily, Nolberto Hanlon, MD, 10 mg at 06/18/21 9476   clonazePAM (KLONOPIN) tablet 0.5 mg, 0.5 mg, Oral, TID PRN, Ottie Glazier, MD   collagenase (SANTYL) ointment, , Topical, Daily, Ivor Costa, MD, Given at 06/18/21 0827   dextromethorphan-guaiFENesin ([place] DM) 30-600 MG per 12 hr tablet 1 tablet, 1 tablet, Oral, BID PRN, Ivor Costa, MD   diltiazem (CARDIZEM) tablet 90 mg, 90 mg, Oral, Q6H, Nolberto Hanlon, MD, 90 mg at 06/19/21 0112   docusate sodium (COLACE) capsule 100 mg, 100 mg, Oral, Daily PRN, Ivor Costa, MD, 100 mg at 06/14/21 0827   famotidine (PEPCID)  tablet 20 mg, 20 mg, Oral, QHS, Ivor Costa, MD, 20 mg at 06/18/21 2258   ferrous gluconate (FERGON) tablet 324 mg, 324 mg, Oral, Weekly, Ivor Costa, MD   folic acid (FOLVITE) tablet 1 mg, 1 mg, Oral, Daily, Ivor Costa, MD, 1 mg at 06/18/21 5465   ipratropium (ATROVENT) nebulizer solution 0.5 mg, 0.5 mg, Nebulization, TID, Ivor Costa, MD, 0.5 mg at 06/18/21 2114   levalbuterol (XOPENEX) nebulizer solution 0.63 mg, 0.63 mg, Nebulization, Q6H PRN, Ivor Costa, MD, 0.63 mg at 06/19/21 0354   levalbuterol (XOPENEX) nebulizer solution 1.25 mg, 1.25 mg, Nebulization, TID, Nolberto Hanlon, MD, 1.25 mg at 06/18/21 2114   loratadine (CLARITIN) tablet 10 mg, 10 mg, Oral, Daily, Ivor Costa, MD, 10 mg at 06/18/21 6568   midodrine (PROAMATINE) tablet 10 mg, 10 mg, Oral, TID WC, Nolberto Hanlon, MD, 10 mg at 06/18/21 1704   montelukast (SINGULAIR) tablet 10 mg, 10 mg, Oral, QHS, Ivor Costa, MD, 10 mg at 06/18/21 2258   ondansetron (ZOFRAN) injection 4 mg, 4 mg, Intravenous, Q8H PRN, Ivor Costa, MD   predniSONE (DELTASONE) tablet 50 mg, 50 mg, Oral, Q breakfast, Ottie Glazier, MD, 50 mg at 06/18/21 1275   Treprostinil Diolamine ER TBCR 0.25 mg, 0.25 mg, Oral, BID, Ivor Costa, MD    ALLERGIES   Codeine and Sulfa antibiotics     REVIEW OF SYSTEMS    Review of Systems:  Gen:  Denies  fever, sweats, chills weigh loss  HEENT: Denies blurred vision, double vision, ear pain, eye pain, hearing loss, nose bleeds, sore throat Cardiac:  No dizziness, chest pain or heaviness, chest tightness,edema Resp:   Denies cough or sputum porduction, shortness of breath,wheezing, hemoptysis,  Gi: Denies swallowing difficulty, stomach pain, nausea or vomiting, diarrhea, constipation, bowel incontinence Gu:  Denies bladder incontinence, burning urine Ext:   Denies Joint pain, stiffness or swelling Skin: Denies  skin rash, easy bruising or bleeding or hives Endoc:  Denies polyuria, polydipsia , polyphagia or weight  change Psych:   Denies depression, insomnia or hallucinations   Other:  All other systems negative   VS: BP 117/65 (BP Location: Left Wrist)   Pulse 70   Temp 98.1 F (36.7 C) (Axillary)   Resp 20   Ht _0  (1.702 m)   Wt (!) 142.7 kg   SpO2 97%   BMI 49.27 kg/m      PHYSICAL EXAM    GENERAL:NAD, no fevers, chills, no weakness no fatigue HEAD: Normocephalic, atraumatic.  EYES: Pupils equal, round, reactive to light. Extraocular muscles intact. No scleral icterus.  MOUTH: Moist mucosal membrane. Dentition intact. No abscess noted.  EAR, NOSE, THROAT: Clear without exudates. No external lesions.  NECK: Supple. No thyromegaly. No nodules. No JVD.  PULMONARY:  CARDIOVASCULAR: S1 and S2.  Regular rate and rhythm. No murmurs, rubs, or gallops. No edema. Pedal pulses 2+ bilaterally.  GASTROINTESTINAL: Soft, nontender, nondistended. No masses. Positive bowel sounds. No hepatosplenomegaly.  MUSCULOSKELETAL: No swelling, clubbing, or edema. Range of motion full in all extremities.  NEUROLOGIC: Cranial nerves II through XII are intact. No gross focal neurological deficits. Sensation intact. Reflexes intact.  SKIN: No ulceration, lesions, rashes, or cyanosis. Skin warm and dry. Turgor intact.  PSYCHIATRIC: Mood, affect within normal limits. The patient is awake, alert and oriented x 3. Insight, judgment intact.       IMAGING    CT HEAD WO CONTRAST (5MM)  Result Date: 07/03/2021 CLINICAL DATA:  Mental status change, unknown cause. EXAM: CT HEAD WITHOUT CONTRAST TECHNIQUE: Contiguous axial images were obtained from the base of the skull through the vertex without intravenous contrast. COMPARISON:  Prior head CT examinations 05/28/2021 and earlier. FINDINGS: Brain: Mild generalized cerebral atrophy. Mild to moderate patchy and ill-defined hypoattenuation within the cerebral white matter, nonspecific but compatible with chronic small vessel ischemic disease. There is no acute  intracranial hemorrhage. No demarcated cortical infarct. No extra-axial fluid collection. No evidence of an intracranial mass. No midline shift. Partially empty sella turcica. Vascular: No hyperdense vessel. Atherosclerotic calcifications. Skull: Normal. Negative for fracture or focal lesion. Sinuses/Orbits: Visualized orbits show no acute finding. Trace bilateral ethmoid sinus mucosal thickening. Small mucous retention cyst within a right ethmoid air cell. IMPRESSION: No evidence of acute intracranial abnormality. Mild-to-moderate chronic small vessel ischemic changes within the cerebral white matter. Mild generalized cerebral atrophy. Mild ethmoid sinus disease, as described. Electronically Signed   By: Kellie Simmering D.O.   On: 06/07/2021 16:01   CT HEAD WO CONTRAST (5MM)  Result Date: 05/28/2021 CLINICAL DATA:  Delirium. EXAM: CT HEAD WITHOUT CONTRAST TECHNIQUE: Contiguous axial images were obtained from the base of the skull through the vertex without intravenous contrast. COMPARISON:  CT head 04/25/2021 BRAIN: BRAIN Patchy and confluent areas of decreased attenuation are noted throughout the deep and periventricular white matter of the cerebral hemispheres bilaterally, compatible with chronic microvascular ischemic disease. No evidence of large-territorial acute infarction. No parenchymal hemorrhage. No mass lesion. No extra-axial collection. No mass effect or midline shift. No hydrocephalus. Basilar cisterns are patent. Vascular: No hyperdense vessel. Atherosclerotic calcifications are present within the cavernous internal carotid arteries. Skull: No acute fracture or focal lesion. Sinuses/Orbits: Paranasal sinuses and mastoid air cells are clear. The orbits are unremarkable. Other: None. IMPRESSION: No acute intracranial abnormality. Electronically Signed   By: Iven Finn M.D.   On: 05/28/2021 18:00   US Venous Img Lower Unilateral Left (DVT)  Result Date: 05/28/2021 CLINICAL DATA:  Recent fall,  cellulitis, pain and swelling EXAM: LEFT LOWER EXTREMITY VENOUS DOPPLER ULTRASOUND TECHNIQUE: Gray-scale sonography with compression, as well as color and duplex ultrasound, were performed to evaluate the deep venous system(s) from the level of the common femoral vein through the popliteal and proximal calf veins. COMPARISON:  None. FINDINGS: VENOUS Normal compressibility of the common femoral, superficial femoral, and popliteal veins, as well as the visualized calf veins. Visualized portions of profunda femoral vein and great saphenous vein unremarkable. No filling defects to suggest DVT on grayscale or color Doppler imaging. Doppler waveforms show normal direction of venous flow, normal respiratory plasticity and response to augmentation. Limited views of the contralateral common femoral vein are unremarkable. OTHER None. Limitations: none IMPRESSION: Negative examination for deep venous thrombosis in the left lower extremity. Electronically Signed   By: Dorna Bloom.D.  On: 05/28/2021 14:37   DG Chest Port 1 View  Result Date: 06/17/2021 CLINICAL DATA:  Atelectasis EXAM: PORTABLE CHEST 1 VIEW COMPARISON:  06/11/2021 FINDINGS: Lung apices are partially obscured. Chronic interstitial changes. Improved aeration at the right lung base. No significant pleural effusion. Stable cardiomegaly. IMPRESSION: Improved aeration at the right lung base.  Otherwise unchanged. Electronically Signed   By: Macy Mis M.D.   On: 06/17/2021 13:18   DG Chest Port 1 View  Result Date: 06/03/2021 CLINICAL DATA:  Questionable sepsis - evaluate for abnormality new consolidation in the right lower EXAM: PORTABLE CHEST 1 VIEW COMPARISON:  05/28/2021. FINDINGS: New consolidation in the right lower lobe. Chronic interstitial prominence, slightly increased in conspicuity on the right. No visible pleural effusions or pneumothorax. Limited evaluation left lung apex to overlapping head/neck. Similar enlargement the cardiac  silhouette. Calcific atherosclerosis aorta. Chronic right rib fractures. Chronic left proximal humerus fracture. IMPRESSION: 1. New consolidation in the right lower lobe, concerning for pneumonia. 2. Chronic interstitial prominence, slightly increased in conspicuity on the right. This could represent chronic change, but infection or mild asymmetric edema are considerations. Electronically Signed   By: Margaretha Sheffield M.D.   On: 06/11/2021 12:40   DG Chest Port 1 View  Result Date: 05/28/2021 CLINICAL DATA:  Multiple falls, generalized weakness for about a week. Bilateral lower extremity edema. Is on oxygen at home. Hx of CHF, COPD. EXAM: PORTABLE CHEST 1 VIEW COMPARISON:  04/26/2021 FINDINGS: Mild enlargement of the cardiac silhouette, stable. No mediastinal or hilar masses. Prominent vascular markings similar to the prior study. Linear opacity at the left lung base consistent with atelectasis or scarring. No lung consolidation. No convincing edema. No visualized pleural effusion and no evidence of a pneumothorax. 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 IMPRESSION: 1. No acute cardiopulmonary disease. Electronically Signed   By: Lajean Manes M.D.   On: 05/28/2021 12:22   DG Knee Complete 4 Views Left  Result Date: 05/25/2021 CLINICAL DATA:  Fall.  Pain EXAM: LEFT KNEE - COMPLETE 4+ VIEW COMPARISON:  None. FINDINGS: There is diffuse soft tissue edema involving the anterior and lateral soft tissues. No underlying fracture or dislocation. Tiny joint effusion within the suprapatellar joint space. Patellofemoral and medial compartment narrowing. IMPRESSION: 1. No acute bone abnormality. 2. Soft tissue edema within the anterior and lateral soft tissues which may reflect contusion. 3. Tiny joint effusion. Electronically Signed   By: Kerby Moors M.D.   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  Total face to face encounter time for this patient visit was 42mn. >50% of the time was  spent in counseling and coordination of care.   Patient/Family are satisfied with care plan and  all questions have been answered.  This document was prepared using Dragon voice recognition software and may include unintentional dictation errors.     Ottie Glazier, M.D.  Division of [place]

## 2021-06-20 ENCOUNTER — Encounter: Payer: Self-pay | Admitting: Family

## 2021-06-20 ENCOUNTER — Inpatient Hospital Stay: Payer: Medicare Other

## 2021-06-20 DIAGNOSIS — J189 Pneumonia, unspecified organism: Secondary | ICD-10-CM | POA: Diagnosis not present

## 2021-06-20 LAB — CBC WITH DIFFERENTIAL/PLATELET
Abs Immature Granulocytes: 0.06 10*3/uL (ref 0.00–0.07)
Basophils Absolute: 0 10*3/uL (ref 0.0–0.1)
Basophils Relative: 0 %
Eosinophils Absolute: 0.1 10*3/uL (ref 0.0–0.5)
Eosinophils Relative: 1 %
HCT: 32.8 % — ABNORMAL LOW (ref 36.0–46.0)
Hemoglobin: 9.7 g/dL — ABNORMAL LOW (ref 12.0–15.0)
Immature Granulocytes: 1 %
Lymphocytes Relative: 8 %
Lymphs Abs: 0.9 10*3/uL (ref 0.7–4.0)
MCH: 27.2 pg (ref 26.0–34.0)
MCHC: 29.6 g/dL — ABNORMAL LOW (ref 30.0–36.0)
MCV: 91.9 fL (ref 80.0–100.0)
Monocytes Absolute: 0.8 10*3/uL (ref 0.1–1.0)
Monocytes Relative: 7 %
Neutro Abs: 9.4 10*3/uL — ABNORMAL HIGH (ref 1.7–7.7)
Neutrophils Relative %: 83 %
Platelets: 274 10*3/uL (ref 150–400)
RBC: 3.57 MIL/uL — ABNORMAL LOW (ref 3.87–5.11)
RDW: 17.8 % — ABNORMAL HIGH (ref 11.5–15.5)
WBC: 11.3 10*3/uL — ABNORMAL HIGH (ref 4.0–10.5)
nRBC: 0.2 % (ref 0.0–0.2)

## 2021-06-20 LAB — GLUCOSE, CAPILLARY
Glucose-Capillary: 97 mg/dL (ref 70–99)
Glucose-Capillary: 130 mg/dL — ABNORMAL HIGH (ref 70–99)
Glucose-Capillary: 181 mg/dL — ABNORMAL HIGH (ref 70–99)

## 2021-06-20 LAB — PROCALCITONIN: Procalcitonin: <0.1 ng/mL

## 2021-06-20 LAB — FERRITIN: Ferritin: 41 ng/mL (ref 11–307)

## 2021-06-20 LAB — D-DIMER, QUANTITATIVE: D-Dimer, Quant: 1.38 ug/mL-FEU — ABNORMAL HIGH (ref 0.00–0.50)

## 2021-06-20 LAB — C-REACTIVE PROTEIN: CRP: 0.7 mg/dL (ref <1.0)

## 2021-06-20 MED ORDER — DEXMEDETOMIDINE HCL IN NACL 400 MCG/100ML IV SOLN: 0.4000 ug/kg/h | INTRAVENOUS | Status: DC

## 2021-06-20 MED ORDER — PROPOFOL 1000 MG/100ML IV EMUL
INTRAVENOUS | Status: AC
  Administered 2021-06-20: 20 ug/kg/min via INTRAVENOUS
  Filled 2021-06-20: qty 100

## 2021-06-20 MED ORDER — MIDAZOLAM HCL 2 MG/2ML IJ SOLN
INTRAMUSCULAR | Status: AC
  Filled 2021-06-20: qty 2

## 2021-06-20 MED ORDER — ETOMIDATE 2 MG/ML IV SOLN
INTRAVENOUS | Status: AC
  Administered 2021-06-20: 20 mg via INTRAVENOUS
  Filled 2021-06-20: qty 20

## 2021-06-20 MED ORDER — KETAMINE HCL 50 MG/5ML IJ SOSY
PREFILLED_SYRINGE | INTRAMUSCULAR | Status: AC
  Filled 2021-06-20: qty 5

## 2021-06-20 MED ORDER — DOCUSATE SODIUM 50 MG/5ML PO LIQD
100.0000 mg | Freq: Two times a day (BID) | ORAL | Status: DC
  Administered 2021-06-20 – 2021-06-22 (×5): 100 mg
  Filled 2021-06-20 (×5): qty 10

## 2021-06-20 MED ORDER — FENTANYL 2500MCG IN NS 250ML (10MCG/ML) PREMIX INFUSION
25.0000 ug/h | INTRAVENOUS | Status: DC
  Administered 2021-06-22: 50 ug/h via INTRAVENOUS
  Filled 2021-06-20: qty 250

## 2021-06-20 MED ORDER — DILTIAZEM HCL 30 MG PO TABS
90.0000 mg | ORAL_TABLET | Freq: Four times a day (QID) | ORAL | Status: DC
  Administered 2021-06-20 – 2021-06-22 (×8): 90 mg
  Filled 2021-06-20 (×8): qty 3

## 2021-06-20 MED ORDER — MIDODRINE HCL 5 MG PO TABS
10.0000 mg | ORAL_TABLET | Freq: Three times a day (TID) | ORAL | Status: DC
  Administered 2021-06-20 – 2021-06-22 (×6): 10 mg
  Filled 2021-06-20 (×6): qty 2

## 2021-06-20 MED ORDER — ETOMIDATE 2 MG/ML IV SOLN: 20.0000 mg | Freq: Once | INTRAVENOUS | Status: AC

## 2021-06-20 MED ORDER — INSULIN ASPART 100 UNIT/ML IJ SOLN
0.0000 [IU] | INTRAMUSCULAR | Status: DC
  Administered 2021-06-20: 4 [IU] via SUBCUTANEOUS
  Administered 2021-06-20: 3 [IU] via SUBCUTANEOUS
  Administered 2021-06-21: 7 [IU] via SUBCUTANEOUS
  Administered 2021-06-21 (×2): 3 [IU] via SUBCUTANEOUS
  Administered 2021-06-21 – 2021-06-22 (×4): 4 [IU] via SUBCUTANEOUS
  Administered 2021-06-22 (×2): 3 [IU] via SUBCUTANEOUS
  Filled 2021-06-20 (×11): qty 1

## 2021-06-20 MED ORDER — FENTANYL BOLUS VIA INFUSION
25.0000 ug | INTRAVENOUS | Status: DC | PRN
  Filled 2021-06-20: qty 100

## 2021-06-20 MED ORDER — VITAL HIGH PROTEIN PO LIQD
1000.0000 mL | ORAL | Status: DC
  Administered 2021-06-20 – 2021-06-22 (×2): 1000 mL

## 2021-06-20 MED ORDER — FENTANYL 2500MCG IN NS 250ML (10MCG/ML) PREMIX INFUSION
INTRAVENOUS | Status: AC
  Administered 2021-06-20: 50 ug/h via INTRAVENOUS
  Filled 2021-06-20: qty 250

## 2021-06-20 MED ORDER — NOREPINEPHRINE 16 MG/250ML-% IV SOLN
0.0000 ug/min | INTRAVENOUS | Status: DC
  Administered 2021-06-20: 2 ug/min via INTRAVENOUS
  Filled 2021-06-20: qty 250

## 2021-06-20 MED ORDER — FENTANYL CITRATE (PF) 100 MCG/2ML IJ SOLN: 100.0000 ug | Freq: Once | INTRAMUSCULAR | Status: AC

## 2021-06-20 MED ORDER — FAMOTIDINE 20 MG PO TABS
20.0000 mg | ORAL_TABLET | Freq: Two times a day (BID) | ORAL | Status: DC
  Administered 2021-06-20 – 2021-06-22 (×5): 20 mg
  Filled 2021-06-20 (×5): qty 1

## 2021-06-20 MED ORDER — FREE WATER
30.0000 mL | Status: DC
  Administered 2021-06-20 – 2021-06-22 (×12): 30 mL

## 2021-06-20 MED ORDER — APIXABAN 5 MG PO TABS
5.0000 mg | ORAL_TABLET | Freq: Two times a day (BID) | ORAL | Status: DC
  Administered 2021-06-20 – 2021-06-22 (×4): 5 mg
  Filled 2021-06-20 (×4): qty 1

## 2021-06-20 MED ORDER — PREDNISONE 20 MG PO TABS
50.0000 mg | ORAL_TABLET | Freq: Every day | ORAL | Status: DC
  Administered 2021-06-21 – 2021-06-22 (×2): 50 mg
  Filled 2021-06-20 (×2): qty 1

## 2021-06-20 MED ORDER — LEVALBUTEROL HCL 0.63 MG/3ML IN NEBU
0.6300 mg | INHALATION_SOLUTION | Freq: Four times a day (QID) | RESPIRATORY_TRACT | Status: DC
  Administered 2021-06-20 – 2021-06-22 (×8): 0.63 mg via RESPIRATORY_TRACT
  Filled 2021-06-20 (×8): qty 3

## 2021-06-20 MED ORDER — FOLIC ACID 1 MG PO TABS
1.0000 mg | ORAL_TABLET | Freq: Every day | ORAL | Status: DC
  Administered 2021-06-21 – 2021-06-22 (×2): 1 mg
  Filled 2021-06-20 (×2): qty 1

## 2021-06-20 MED ORDER — ATORVASTATIN CALCIUM 20 MG PO TABS
20.0000 mg | ORAL_TABLET | Freq: Every day | ORAL | Status: DC
  Administered 2021-06-20 – 2021-06-21 (×2): 20 mg
  Filled 2021-06-20 (×2): qty 1

## 2021-06-20 MED ORDER — PROPOFOL 1000 MG/100ML IV EMUL
0.0000 ug/kg/min | INTRAVENOUS | Status: DC
  Administered 2021-06-20: 10 ug/kg/min via INTRAVENOUS
  Administered 2021-06-20 – 2021-06-22 (×7): 20 ug/kg/min via INTRAVENOUS
  Filled 2021-06-20 (×8): qty 100

## 2021-06-20 MED ORDER — LORATADINE 10 MG PO TABS
10.0000 mg | ORAL_TABLET | Freq: Every day | ORAL | Status: DC
  Administered 2021-06-21 – 2021-06-22 (×2): 10 mg
  Filled 2021-06-20 (×2): qty 1

## 2021-06-20 MED ORDER — JUVEN PO PACK
1.0000 | PACK | Freq: Two times a day (BID) | ORAL | Status: DC
  Administered 2021-06-21 – 2021-06-22 (×4): 1

## 2021-06-20 MED ORDER — ROCURONIUM BROMIDE 50 MG/5ML IV SOLN: 50.0000 mg | Freq: Once | INTRAVENOUS | Status: AC

## 2021-06-20 MED ORDER — ROCURONIUM BROMIDE 10 MG/ML (PF) SYRINGE
PREFILLED_SYRINGE | INTRAVENOUS | Status: AC
  Administered 2021-06-20: 50 mg via INTRAVENOUS
  Filled 2021-06-20: qty 10

## 2021-06-20 MED ORDER — SUCCINYLCHOLINE CHLORIDE 200 MG/10ML IV SOSY
PREFILLED_SYRINGE | INTRAVENOUS | Status: AC
  Filled 2021-06-20: qty 10

## 2021-06-20 MED ORDER — MIDAZOLAM HCL 2 MG/2ML IJ SOLN: 1.0000 mg | INTRAMUSCULAR | Status: DC | PRN

## 2021-06-20 MED ORDER — FENTANYL CITRATE PF 50 MCG/ML IJ SOSY
PREFILLED_SYRINGE | INTRAMUSCULAR | Status: AC
  Administered 2021-06-20: 50 ug
  Filled 2021-06-20: qty 2

## 2021-06-20 MED ORDER — PROSOURCE TF PO LIQD
45.0000 mL | Freq: Two times a day (BID) | ORAL | Status: DC
  Administered 2021-06-20 – 2021-06-22 (×4): 45 mL
  Filled 2021-06-20 (×5): qty 45

## 2021-06-20 MED ORDER — CHLORHEXIDINE GLUCONATE CLOTH 2 % EX PADS
6.0000 | MEDICATED_PAD | Freq: Every day | CUTANEOUS | Status: DC
  Administered 2021-06-20 – 2021-06-21 (×2): 6 via TOPICAL

## 2021-06-20 MED ORDER — POLYETHYLENE GLYCOL 3350 17 G PO PACK
17.0000 g | PACK | Freq: Every day | ORAL | Status: DC
  Administered 2021-06-20 – 2021-06-22 (×3): 17 g
  Filled 2021-06-20 (×3): qty 1

## 2021-06-20 MED ORDER — FENTANYL CITRATE (PF) 100 MCG/2ML IJ SOLN
INTRAMUSCULAR | Status: AC
  Filled 2021-06-20: qty 2

## 2021-06-20 MED ORDER — IPRATROPIUM BROMIDE 0.02 % IN SOLN
0.5000 mg | Freq: Four times a day (QID) | RESPIRATORY_TRACT | Status: DC
  Administered 2021-06-20 – 2021-06-22 (×8): 0.5 mg via RESPIRATORY_TRACT
  Filled 2021-06-20 (×9): qty 2.5

## 2021-06-20 MED ORDER — BISOPROLOL FUMARATE 5 MG PO TABS
10.0000 mg | ORAL_TABLET | Freq: Every day | ORAL | Status: DC
  Administered 2021-06-22: 10 mg
  Filled 2021-06-20 (×2): qty 2

## 2021-06-20 MED ORDER — MONTELUKAST SODIUM 10 MG PO TABS
10.0000 mg | ORAL_TABLET | Freq: Every day | ORAL | Status: DC
  Administered 2021-06-20 – 2021-06-21 (×2): 10 mg
  Filled 2021-06-20 (×2): qty 1

## 2021-06-20 NOTE — Progress Notes (Signed)
PT Cancellation Note  Patient Details Name: Paula Torres MRN: [id number] DOB: 1951-04-06   Cancelled Treatment:    Reason Eval/Treat Not Completed: Patient not medically ready.  Chart reviewed.  Pt transferred to CCU this morning 06/20/21.  D/t pt transferring to higher level of care, per PT protocol require new PT consult in order to continue therapy (will discontinue current PT order d/t this).  Please re-consult PT when pt is medically appropriate to participate in PT.  Leitha Bleak, PT 06/20/21, 8:12 AM

## 2021-06-20 NOTE — Significant Event (Addendum)
Writer was notified by another RN that pt continuous pulse ox reading in the 60s. RN immediately went into room and pt had removed bipap mask. Pt's room door closed due to covid isolation so difficult to hear bipap alarming even though alarm on loudest setting. RN placed pt on nasal cannula on 8 liters and sats increased to 90% within 2-3 minutes; O2 slowly decreased back down to 4L and sats >90%. Pt still oriented to self only; has been frequently removing bipap mask tonight and the last 2 nights but tonight first time with desat so low.  MD notified and RT at bedside to see pt. After discussion with MD and RT plan made to transfer pt to stepdown bed in ICU for closer monitoring.  Report called to [facility], RN and pt transported to ICU via bed on bipap by this RN and RT.

## 2021-06-20 NOTE — Procedures (Signed)
Central Venous Catheter Insertion Procedure Note  Paula Torres  [id number]  [birth year]  Date:06/20/21  Time:9:51 AM   Provider Performing:Leina Babe D Dewaine Conger   Procedure: Insertion of Non-tunneled Central Venous Catheter(36556) with US guidance (43568)   Indication(s) Medication administration and Difficult access  Consent Unable to obtain consent due to emergent nature of procedure.  Anesthesia Topical only with 1% lidocaine   Timeout Verified patient identification, verified procedure, site/side was marked, verified correct patient position, special equipment/implants available, medications/allergies/relevant history reviewed, required imaging and test results available.  Sterile Technique Maximal sterile technique including full sterile barrier drape, hand hygiene, sterile gown, sterile gloves, mask, hair covering, sterile ultrasound probe cover (if used).  Procedure Description Area of catheter insertion was cleaned with chlorhexidine and draped in sterile fashion.  With real-time ultrasound guidance a central venous catheter was placed into the right internal jugular vein. Nonpulsatile blood flow and easy flushing noted in all ports.  The catheter was sutured in place and sterile dressing applied.  Complications/Tolerance None; patient tolerated the procedure well. Chest X-ray is ordered to verify placement for internal jugular or subclavian cannulation.   Chest x-ray is not ordered for femoral cannulation.  EBL Minimal  Specimen(s) None   Line secured at 16 cm.  BIOPATCH applied to the insertion site.     Darel Hong, AGACNP-BC [facility] Prefer epic messenger for cross cover needs If after hours, please call E-link

## 2021-06-20 NOTE — Progress Notes (Addendum)
Daily Progress Note   Patient Name: Paula Torres       Date: 06/20/2021 DOB: Jan 26, 1951  Age: 70 y.o. MRN#: [id number] Attending Physician: Flora Lipps, MD Primary Care Physician: Rusty Aus, MD Admit Date: 06/15/2021  Reason for Consultation/Follow-up: Establishing goals of care  Subjective: PMT has been following. Spoke with  CCM staff for additional updates. Patient is currently in ICU and is currently requiring ventilator support. No family at bedside. Attempted to reach husband unsuccessfully. Will need to reach out to family regarding [place] moving forward.   Length of Stay: 8  Current Medications: Scheduled Meds:   apixaban  5 mg Per Tube BID   atorvastatin  20 mg Per Tube QHS   [START ON 06/21/2021] bisoprolol  10 mg Per Tube Daily   Chlorhexidine Gluconate Cloth  6 each Topical Daily   collagenase   Topical Daily   diltiazem  90 mg Per Tube Q6H   docusate  100 mg Per Tube BID   famotidine  20 mg Per Tube BID   feeding supplement (PROSource TF)  45 mL Per Tube BID   ferrous gluconate  324 mg Oral Weekly   [START ON 46/56/8127] folic acid  1 mg Per Tube Daily   free water  30 mL Per Tube Q4H   insulin aspart  0-20 Units Subcutaneous Q4H   ipratropium  0.5 mg Nebulization Q6H   levalbuterol  0.63 mg Nebulization Q6H   [START ON 06/21/2021] loratadine  10 mg Per Tube Daily   midodrine  10 mg Per Tube TID WC   montelukast  10 mg Per Tube QHS   [START ON 06/21/2021] nutrition supplement (JUVEN)  1 packet Per Tube BID BM   polyethylene glycol  17 g Per Tube Daily   [START ON 06/21/2021] predniSONE  50 mg Per Tube Q breakfast   Treprostinil Diolamine ER  0.25 mg Oral BID    Continuous Infusions:  feeding supplement (VITAL HIGH PROTEIN) 1,000 mL (06/20/21 1401)    fentaNYL infusion INTRAVENOUS 50 mcg/hr (06/20/21 1500)   propofol (DIPRIVAN) infusion 20 mcg/kg/min (06/20/21 1500)   remdesivir 100 mg in NS 100 mL Stopped (06/20/21 1140)    PRN Meds: acetaminophen, clonazePAM, dextromethorphan-guaiFENesin, docusate sodium, fentaNYL, midazolam, midazolam, ondansetron (ZOFRAN) IV  Physical Exam Constitutional:      Comments: Eyes  closed. On ventilator.            Vital Signs: BP (!) 100/59   Pulse 67   Temp (!) 97 F (36.1 C)   Resp 16   Ht _0  (1.702 m)   Wt (!) 142.7 kg   SpO2 100%   BMI 49.27 kg/m  SpO2: SpO2: 100 % O2 Device: O2 Device: Ventilator O2 Flow Rate: O2 Flow Rate (L/min): 4 L/min  Intake/output summary:  Intake/Output Summary (Last 24 hours) at 06/20/2021 1559 Last data filed at 06/20/2021 1500 Gross per 24 hour  Intake 251.62 ml  Output 600 ml  Net -348.38 ml   LBM: Last BM Date: 06/20/21 Baseline Weight: Weight: (!) 140 kg Most recent weight: Weight: (!) 142.7 kg         Flowsheet Rows    Flowsheet Row Most Recent Value  Intake Tab   Referral Department Hospitalist  Unit at Time of Referral Cardiac/Telemetry Unit  Palliative Care Primary Diagnosis Cardiac  Date Notified 06/13/21  Palliative Care Type Return patient Palliative Care  Reason for referral Clarify Goals of Care  Date of Admission 06/30/2021  Date first seen by Palliative Care 06/13/21  # of days Palliative referral response time 0 Day(s)  # of days IP prior to Palliative referral 1  Clinical Assessment   Palliative Performance Scale Score 40%  Pain Max last 24 hours Not able to report  Pain Min Last 24 hours Not able to report  Dyspnea Max Last 24 Hours Not able to report  Dyspnea Min Last 24 hours Not able to report  Psychosocial & Spiritual Assessment   Palliative Care Outcomes        Patient Active Problem List   Diagnosis Date Noted   Pressure injury of skin 06/14/2021   HCAP (healthcare-associated pneumonia) 06/30/2021   PAF  (paroxysmal atrial fibrillation) (HCC)    OSA (obstructive sleep apnea)    Sepsis (HCC)    CAD (coronary artery disease)    Wound infection_left lower leg 05/28/2021   COPD (chronic obstructive pulmonary disease) ([place]) 05/28/2021   Chronic diastolic CHF (congestive heart failure) ([place]) 05/28/2021   Iron deficiency anemia 05/28/2021   Cellulitis of left lower leg 05/28/2021   Acute on chronic respiratory failure ([place]) 04/25/2021   Acute on chronic respiratory failure with hypoxia and hypercapnia (HCC) 05/11/2020   AF (paroxysmal atrial fibrillation) ([place]) 05/11/2020   Acute on chronic diastolic congestive heart failure (HCC)    Acute respiratory distress    Pulmonary hypertension (HCC)    Restless leg syndrome    Depression    Atrial fibrillation with RVR ([place]) 01/21/2020   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[place]) 01/21/2020   Abnormal cardiovascular stress test    Demand ischemia (HCC)    Hyperlipidemia    COPD with acute exacerbation ([place]) 11/08/2019   Acute on chronic diastolic CHF (congestive heart failure) ([place]) 11/08/2019   Acute respiratory failure with hypoxia and hypercapnia (HCC) 11/08/2019   Obesity, Class III, BMI 40-49.9 (morbid obesity) ([place]) 11/08/2019   Elevated troponin 11/08/2019    Palliative Care Assessment & Plan   Recommendations/Plan: Will reach out to family for GOC as patient is now intubated.   Code Status:    Code Status Orders  (From admission, onward)           Start     Ordered   06/13/21 0121  Full code  Continuous        06/13/21 0121  Code Status History     Date Active Date Inactive Code Status Order ID Comments User Context   05/28/2021 2023 06/08/2021 2152 Full Code [id number]  Ivor Costa, MD ED   04/25/2021 [phone] 04/28/2021 0010 Full Code [id number]  Collier Bullock, MD ED   05/09/2020 2150 05/12/2020 2301 Full Code [id number]  Mansy, Arvella Merles, MD ED   01/19/2020 0541 01/22/2020 2100 Full Code [id number]  Tonye Royalty, NP ED   11/08/2019 2209 11/10/2019 2131 Full Code [id number]  Toy Baker, MD Inpatient      Advance Directive Documentation    Flowsheet Row Most Recent Value  Type of Advance Directive Healthcare Power of Attorney  Pre-existing out of facility DNR order (yellow form or pink MOST form) --  "MOST" Form in Place? --        Thank you for allowing the Palliative Medicine Team to assist in the care of this patient.       Total Time 15 min Prolonged Time Billed  no       Greater than 50%  of this time was spent counseling and coordinating care related to the above assessment and plan.  Asencion Gowda, NP  Please contact Palliative Medicine Team phone at [phone] for questions and concerns.

## 2021-06-20 NOTE — Progress Notes (Signed)
Notified in regards to the patient continuing to take off BiPAP mask  desaturation  into the 60s.  Transferring to a progressive bed for closer monitoring.

## 2021-06-20 NOTE — Procedures (Signed)
Intubation Procedure Note  Paula Torres  [id number]  12-21-1950  Date:06/20/21  Time:9:50 AM   Provider Performing:Dejah Droessler D Dewaine Conger    Procedure: Intubation (93810)  Indication(s) Respiratory Failure  Consent Risks of the procedure as well as the alternatives and risks of each were explained to the patient and/or caregiver.  Consent for the procedure was obtained and is signed in the bedside chart   Anesthesia Etomidate, Fentanyl, and Rocuronium   Time Out Verified patient identification, verified procedure, site/side was marked, verified correct patient position, special equipment/implants available, medications/allergies/relevant history reviewed, required imaging and test results available.   Sterile Technique Usual hand hygeine, masks, and gloves were used   Procedure Description Patient positioned in bed supine.  Sedation given as noted above.  Patient was intubated with endotracheal tube using Glidescope.  View was Grade 1 full glottis .  Number of attempts was 1.  Colorimetric CO2 detector was consistent with tracheal placement.   Complications/Tolerance None; patient tolerated the procedure well. Chest X-ray is ordered to verify placement.   EBL Minimal   Specimen(s) None   Size 7.5 ETT  Tube secured at 23 cm at the lip.     Darel Hong, AGACNP-BC [facility] Prefer epic messenger for cross cover needs If after hours, please call E-link

## 2021-06-20 NOTE — Consult Note (Signed)
Paula Torres, MRN:  [id number], DOB:  05-01-1951, LOS: 8 ADMISSION DATE:  06/22/2021   BRIEF SYNOPSIS 70 year old female with a history of diastolic CHF as well as COPD and morbid obesity with pickwickian syndrome and obesity hypoventilation with chronic hypercapnia.  She also has a history of pulmonary hypertension, obstructive sleep apnea atrial fibrillation and essential hypertension.  She came into the hospital with complaints of acute onset dyspnea with hypoxemia.  Daughter was able to respond and was able to increase oxygen therapy as well as call EMS due to respiratory failure.  She was placed on BiPAP  Admitted or acute cor pulmonale and COPD exacerbation with diastolic heart failure     Significant Hospital Events: Including procedures, antibiotic start and stop dates in addition to other pertinent events   Admitted 10/10 for resp distress COVID NEG 10/17 placed on biPAP transferred to ICU COVID + 10/18 progressive hypoxia, severe resp failure,  10/18 intubated RT CVL placed     Micro Data:  COVID + on 10/17  Antimicrobials:   Antibiotics Given (last 72 hours)     Date/Time Action Medication Dose Rate   06/19/21 1800 New Bag/Given   remdesivir 200 mg in sodium chloride 0.9% 250 mL IVPB 200 mg 580 mL/hr            Interim History / Subjective:  Severe resp failure Severe hypoxia End stage COPD Morbid obesity Poor prognosis       Objective   Blood pressure (!) 128/95, pulse 67, temperature 98.7 F (37.1 C), temperature source Axillary, resp. rate (!) 27, height _0  (1.702 m), weight (!) 142.7 kg, SpO2 100 %.    Vent Mode: PRVC FiO2 (%):  [40 %-60 %] 60 % Set Rate:  [14 bmp] 14 bmp Vt Set:  [500 mL] 500 mL PEEP:  [10 cmH20] 10 cmH20 Plateau Pressure:  [27 cmH20] 27 cmH20   Intake/Output Summary (Last 24 hours) at 06/20/2021 0949 Last data filed at 06/20/2021 0300 Gross per 24 hour  Intake 240 ml  Output 400 ml  Net -160 ml    Filed Weights   06/17/21 0300 06/18/21 0511 06/19/21 0515  Weight: (!) 142.7 kg (!) 142.6 kg (!) 142.7 kg      REVIEW OF SYSTEMS  PATIENT IS UNABLE TO PROVIDE COMPLETE REVIEW OF SYSTEMS DUE TO SEVERE CRITICAL ILLNESS AND TOXIC METABOLIC ENCEPHALOPATHY   PHYSICAL EXAMINATION:  GENERAL:critically ill appearing, +resp distress EYES: Pupils equal, round, reactive to light.  No scleral icterus.  MOUTH: Moist mucosal membrane. INTUBATED NECK: Supple.  PULMONARY: +rhonchi, CARDIOVASCULAR: S1 and S2.  No murmurs  GASTROINTESTINAL: Soft, nontender, -distended. Positive bowel sounds.  MUSCULOSKELETAL: No swelling, clubbing, or edema.  NEUROLOGIC: obtunded SKIN:intact,warm,dry     Labs/imaging that I havepersonally reviewed  (right click and "Reselect all SmartList Selections" daily)       ASSESSMENT AND PLAN SYNOPSIS  70 yo morbidly obese white female with severe hypoxic resp failure ON MV support with COVID 19 infection and pneumonia in the setting of end stage COPD with cor pulmonale and acute diastolic heart failure  Severe ACUTE Hypoxic and Hypercapnic Respiratory Failure -continue Mechanical Ventilator support -continue Bronchodilator Therapy -Wean Fio2 and PEEP as tolerated -VAP/VENT bundle implementation -will NOT perform SAT/SBT when respiratory parameters are met  Vent Mode: PRVC FiO2 (%):  [40 %-60 %] 60 % Set Rate:  [14 bmp] 14 bmp Vt Set:  [500 mL] 500 mL PEEP:  [10 cmH20] 10 cmH20 Plateau Pressure:  [27  cmH20] 27 cmH20  Acute hypoxemic respiratory failure due to COVID-19 pneumonia/ARDS Mechanical ventilation via ARDS protocol, target PRVC 6 cc/kg Wean PEEP and FiO2 as able Goal plateau pressure less than 30, driving pressure less than 15 Paralytics if necessary for vent synchrony, gas exchange Cycle prone positioning if necessary for oxygenation Deep sedation per PAD protocol diuresis as tolerated based on Kidney function VAP prevention order  set Follow inflammatory markers as needed with CRP Vitamin C, zinc as indicated Plan to repeat and check resp cultures if needed  SEVERE COPD EXACERBATION -continue IV steroids as prescribed -continue NEB THERAPY as prescribed   CARDIAC FAILURE-acute diastolic dysfunction -oxygen as needed -Lasix as tolerated -follow up cardiac enzymes as indicated   CARDIAC ICU monitoring    NEUROLOGY Acute toxic metabolic encephalopathy, need for sedation Goal RASS -2 to -3   INFECTIOUS DISEASE -continue antibiotics as prescribed -follow up cultures COVID 19 infection  ENDO - ICU hypoglycemic\Hyperglycemia protocol -check FSBS per protocol   GI GI PROPHYLAXIS as indicated  NUTRITIONAL STATUS DIET-->TF's as tolerated Constipation protocol as indicated   ELECTROLYTES -follow labs as needed -replace as needed -pharmacy consultation and following     Best practice (right click and "Reselect all SmartList Selections" daily)  Diet: NPO Pain/Anxiety/Delirium protocol (if indicated): Yes (RASS goal -2) VAP protocol (if indicated): Yes DVT prophylaxis: Systemic AC GI prophylaxis: PPI Glucose control:  SSI Yes Central venous access:  Yes, and it is still needed Arterial line:  Yes, and it is still needed Foley:  Yes, and it is still needed Mobility:  bed rest  Code Status:  FULL Disposition:ICU  Labs   CBC: Recent Labs  Lab 06/16/21 0813 06/17/21 0652 06/18/21 0532 06/19/21 0518 06/20/21 0544  WBC 11.0* 14.1* 10.0 12.1* 11.3*  NEUTROABS 9.4* 11.8* 8.6* 10.0* 9.4*  HGB 8.6* 9.1* 8.9* 9.2* 9.7*  HCT 29.0* 29.9* 30.8* 30.3* 32.8*  MCV 89.0 91.4 89.5 88.6 91.9  PLT 235 237 243 252 149    Basic Metabolic Panel: Recent Labs  Lab 06/15/21 0454 06/16/21 0813 06/17/21 0652 06/18/21 0532 06/19/21 0518 06/19/21 0750  NA 137 140 140 137  --  137  K 4.3 4.7 4.7 4.9 4.6 4.7  CL 98 98 98 97*  --  95*  CO2 35* 34* 36* 35*  --  34*  GLUCOSE 151* 164* 133* 157*   --  117*  BUN 33* 34* 34* 33*  --  33*  CREATININE 0.88 0.92 0.84 0.76  --  0.78  CALCIUM 9.1 9.1 8.9 8.7*  --  8.9   GFR: Estimated Creatinine Clearance: 98.5 mL/min (by C-G formula based on SCr of 0.78 mg/dL). Recent Labs  Lab 06/17/21 0652 06/18/21 0532 06/19/21 0518 06/20/21 0544  WBC 14.1* 10.0 12.1* 11.3*    Liver Function Tests: No results for input(s): AST, ALT, ALKPHOS, BILITOT, PROT, ALBUMIN in the last 168 hours. No results for input(s): LIPASE, AMYLASE in the last 168 hours. No results for input(s): AMMONIA in the last 168 hours.  ABG    Component Value Date/Time   PHART 7.40 06/19/2021 0822   PCO2ART 63 (H) 06/19/2021 0822   PO2ART 54 (L) 06/19/2021 0822   HCO3 39.0 (H) 06/19/2021 0822   O2SAT 87.6 06/19/2021 0822     Coagulation Profile: No results for input(s): INR, PROTIME in the last 168 hours.  Cardiac Enzymes: No results for input(s): CKTOTAL, CKMB, CKMBINDEX, TROPONINI in the last 168 hours.  HbA1C: Hgb A1c MFr Bld  Date/Time Value  Ref Range Status  01/19/2020 08:37 AM 5.7 (H) 4.8 - 5.6 % Final    Comment:    (NOTE)         Prediabetes: 5.7 - 6.4         Diabetes: >6.4         Glycemic control for adults with diabetes: <7.0   11/09/2019 09:14 AM 6.0 (H) 4.8 - 5.6 % Final    Comment:    (NOTE) Pre diabetes:          5.7%-6.4% Diabetes:              >6.4% Glycemic control for   <7.0% adults with diabetes     CBG: Recent Labs  Lab 06/13/21 2207  GLUCAP 182*     Past Medical History:  She,  has a past medical history of (HFpEF) heart failure with preserved ejection fraction (HCC), Chronic respiratory failure with hypoxia ([place]), COPD (chronic obstructive pulmonary disease) ([place]), Coronary artery disease, non-occlusive, Hypertension, OSA (obstructive sleep apnea), PAF (paroxysmal atrial fibrillation) ([place]), and Pulmonary hypertension ([place]).   Surgical History:   Past Surgical History:  Procedure Laterality Date   NO PAST SURGERIES      RIGHT/LEFT HEART CATH AND CORONARY ANGIOGRAPHY Left 12/21/2019   Procedure: RIGHT/LEFT HEART CATH AND CORONARY ANGIOGRAPHY;  Surgeon: Wellington Hampshire, MD;  Location: [facility];  Service: Cardiovascular;  Laterality: Left;     Social History:   reports that she quit smoking about 4 years ago. Her smoking use included cigarettes. She has never used smokeless tobacco. She reports that she does not currently use alcohol. She reports that she does not use drugs.   Family History:  Her family history includes Hypertension in an other family member.   Allergies Allergies  Allergen Reactions   Codeine     Back Headache   Sulfa Antibiotics     Reported to pt from mother who is no longer living -  Details unknown     Home Medications  Prior to Admission medications   Medication Sig Start Date End Date Taking? Authorizing Provider  atorvastatin (LIPITOR) 20 MG tablet Take 1 tablet (20 mg total) by mouth daily at 6 PM. Patient taking differently: Take 20 mg by mouth at bedtime. 11/23/19  Yes Loel Dubonnet, NP  bisoprolol (ZEBETA) 10 MG tablet Take 1 tablet (10 mg total) by mouth 2 (two) times daily. 06/08/21  Yes Cherene Altes, MD  collagenase (SANTYL) ointment Apply topically daily. 06/09/21  Yes Cherene Altes, MD  diltiazem (CARDIZEM) 60 MG tablet Take 1 tablet (60 mg total) by mouth every 8 (eight) hours. 06/08/21  Yes Cherene Altes, MD  famotidine (PEPCID) 20 MG tablet Take 20 mg by mouth at bedtime.   Yes [provider]  ferrous gluconate (FERGON) 324 MG tablet Take 324 mg by mouth once a week.   Yes [provider]  Fluticasone-Umeclidin-Vilant 100-62.5-25 MCG/INH AEPB Inhale 1 puff into the lungs daily.   Yes [provider]  folic acid (FOLVITE) 1 MG tablet Take 1 tablet (1 mg total) by mouth daily. 06/09/21  Yes Cherene Altes, MD  levalbuterol (XOPENEX) 1.25 MG/0.5ML nebulizer solution Take 1.25 mg by nebulization 3 (three)  times daily. 06/08/21  Yes Cherene Altes, MD  loratadine (CLARITIN) 10 MG tablet Take 1 tablet (10 mg total) by mouth daily. 05/13/20  Yes Nicole Kindred A, DO  montelukast (SINGULAIR) 10 MG tablet Take 10 mg by mouth at bedtime.  Yes [provider]  Treprostinil Diolamine ER 0.25 MG TBCR Take 0.25 mg by mouth in the morning and at bedtime.   Yes [provider]  acetaminophen (TYLENOL) 325 MG tablet Take 2 tablets (650 mg total) by mouth every 6 (six) hours as needed for mild pain, fever, moderate pain or headache. 06/08/21   Cherene Altes, MD  dextromethorphan-guaiFENesin (MUCINEX DM) 30-600 MG 12hr tablet Take 1 tablet by mouth 2 (two) times daily as needed for cough. 06/08/21   Cherene Altes, MD  docusate sodium (COLACE) 100 MG capsule Take 1 tablet once or twice daily as needed for constipation while taking narcotic pain medicine 05/25/21   Hinda Kehr, MD  fluticasone [facility]) 50 MCG/ACT nasal spray Place 2 sprays into both nostrils daily as needed for allergies.    [provider]  levalbuterol Penne Lash) 0.63 MG/3ML nebulizer solution Take 3 mLs (0.63 mg total) by nebulization every 4 (four) hours as needed for wheezing or shortness of breath. 06/08/21   Cherene Altes, MD  Vitamin D, Ergocalciferol, (DRISDOL) 1.25 MG (50000 UNIT) CAPS capsule Take 50,000 Units by mouth every Saturday.    [provider]       DVT/GI PRX  assessed I Assessed the need for Labs I Assessed the need for Foley I Assessed the need for Central Venous Line Family Discussion when available I Assessed the need for Mobilization I made an Assessment of medications to be adjusted accordingly Safety Risk assessment completed  CASE DISCUSSED IN MULTIDISCIPLINARY ROUNDS WITH ICU TEAM     Critical Care Time devoted to patient care services described in this note is 75 minutes.   Critical care was necessary to treat /prevent imminent and life-threatening  deterioration.   PATIENT WITH VERY POOR PROGNOSIS I ANTICIPATE PROLONGED ICU LOS  Patient is critically ill. Patient with Multiorgan failure and at high risk for cardiac arrest and death.    Corrin Parker, M.D.  [facility]  Medical Director [place] Director [facility]

## 2021-06-20 NOTE — Progress Notes (Signed)
OT Cancellation Note  Patient Details Name: Paula Torres MRN: [id number] DOB: 1951-01-01   Cancelled Treatment:    Reason Eval/Treat Not Completed: Medical issues which prohibited therapy. Per chart, pt transferred to CCU secondary to decline in status. OT to complete orders at this time. Please reconsult when pt is able to medically participate in OT intervention.   Darleen Crocker, MS, OTR/L , CBIS ascom [phone]  06/20/21, 8:41 AM

## 2021-06-20 NOTE — Progress Notes (Signed)
Initial Nutrition Assessment  DOCUMENTATION CODES:   Morbid obesity  INTERVENTION:   Vital HP _0 /hr- Initiate at 22m/hr and increase by 180mhr q 8 hours until goal rate is reached.   Pro-Source 4569mID via tube, provides 40kcal and 11g of protein per serving   Propofol: 17.12 ml/hr- provides 452kcal/day   Free water flushes 51m72m hours to maintain tube patency   Regimen provides 1962kcal/day, 148g/day protein and 1384ml37m of free water  Juven Fruit Punch BID, each serving provides 95kcal and 2.5g of protein (amino acids glutamine and arginine)  Pt at high refeed risk; recommend monitor potassium, magnesium and phosphorus labs daily until stable  NUTRITION DIAGNOSIS:   Inadequate oral intake related to inability to eat (pt sedated and ventilated) as evidenced by NPO status.  GOAL:   Provide needs based on ASPEN/SCCM guidelines  MONITOR:   Vent status, Labs, Weight trends, TF tolerance, Skin, I & O's  REASON FOR ASSESSMENT:   Ventilator    ASSESSMENT:   69 y.62 female with medical history significant of dCHF, hyperlipidemia, COPD on 3-4 L oxygen, GERD, depression, PAF (was on Eliquis which is on hold due to recent leg wound bleeding), pulmonary hypertension, OSA on BiPAP, CAD, iron deficiency anemia, morbid obesity and CKD-3A who is admitted with COPD exacerbation, ARDS, PNA and COVID 19.  Pt sedated and ventilated. OGT in place. Plan is to start tube feeds today. Pt is likely at refeed risk. Per chart, pt is up ~47lbs from her UBW. Pt up ~6lbs since admit.   Medications reviewed and include: colace, pepcid, ferrous gluconate, folic acid, miralax, prednisone, propofol   Labs reviewed: BUN 33(H) Wbc- 11.3(H), Hgb 9.7(L), Hct 32.8(L)  Patient is currently intubated on ventilator support MV: 7.2 L/min Temp (24hrs), Avg:98.3 F (36.8 C), Min:97.9 F (36.6 C), Max:98.7 F (37.1 C)  Propofol: 17.12 ml/hr- provides 452kcal/day   MAP- >65mmH12mUOP- 400ml  64mTRITION - FOCUSED PHYSICAL EXAM:  Flowsheet Row Most Recent Value  Orbital Region No depletion  Upper Arm Region No depletion  Thoracic and Lumbar Region No depletion  Buccal Region No depletion  Temple Region No depletion  Clavicle Bone Region No depletion  Clavicle and Acromion Bone Region No depletion  Scapular Bone Region No depletion  Dorsal Hand No depletion  Patellar Region No depletion  Anterior Thigh Region No depletion  Posterior Calf Region No depletion  Edema (RD Assessment) Moderate  Hair Reviewed  Eyes Reviewed  Mouth Reviewed  Skin Reviewed  Nails Reviewed   Diet Order:   Diet Order             Diet NPO time specified  Diet effective now                  EDUCATION NEEDS:   No education needs have been identified at this time  Skin:  Skin Assessment: Reviewed RN Assessment (ecchymosis, cellulitis, wound L knee, Stage II coccyx, tibial wound)  Last BM:  10/18- type 3  Height:   Ht Readings from Last 1 Encounters:  06/03/2021 _1  (1.702 m)    Weight:   Wt Readings from Last 1 Encounters:  06/19/21 (!) 142.7 kg    Ideal Body Weight:  61.36 kg  BMI:  Body mass index is 49.27 kg/m.  Estimated Nutritional Needs:   Kcal:  1878kcal/day (20kcal/kg adj weight)  Protein:  >150g/day  Fluid:  1.6-1.9L/day  Krew Hortman CKoleen Distance, LDN Please refer to [facility] and/or RD  on-call/weekend/after hours pager

## 2021-06-20 NOTE — Progress Notes (Addendum)
Pt intubated by Darlyn Chamber, NP and RT at bedside with no complications. Co2 detector with positive color change and auscultated bilateral breath sounds. ETT at lip measuring at 23cm. Xray placed and pending for placement confirmation.

## 2021-06-21 ENCOUNTER — Inpatient Hospital Stay: Payer: Medicare Other

## 2021-06-21 DIAGNOSIS — J189 Pneumonia, unspecified organism: Secondary | ICD-10-CM | POA: Diagnosis not present

## 2021-06-21 LAB — CBC WITH DIFFERENTIAL/PLATELET
Abs Immature Granulocytes: 0.1 10*3/uL — ABNORMAL HIGH (ref 0.00–0.07)
Basophils Absolute: 0 10*3/uL (ref 0.0–0.1)
Basophils Relative: 0 %
Eosinophils Absolute: 0 10*3/uL (ref 0.0–0.5)
Eosinophils Relative: 0 %
HCT: 34.7 % — ABNORMAL LOW (ref 36.0–46.0)
Hemoglobin: 10.5 g/dL — ABNORMAL LOW (ref 12.0–15.0)
Immature Granulocytes: 1 %
Lymphocytes Relative: 5 %
Lymphs Abs: 0.7 10*3/uL (ref 0.7–4.0)
MCH: 27.1 pg (ref 26.0–34.0)
MCHC: 30.3 g/dL (ref 30.0–36.0)
MCV: 89.7 fL (ref 80.0–100.0)
Monocytes Absolute: 1.1 10*3/uL — ABNORMAL HIGH (ref 0.1–1.0)
Monocytes Relative: 8 %
Neutro Abs: 12.4 10*3/uL — ABNORMAL HIGH (ref 1.7–7.7)
Neutrophils Relative %: 86 %
Platelets: 342 10*3/uL (ref 150–400)
RBC: 3.87 MIL/uL (ref 3.87–5.11)
RDW: 17.7 % — ABNORMAL HIGH (ref 11.5–15.5)
WBC: 14.3 10*3/uL — ABNORMAL HIGH (ref 4.0–10.5)
nRBC: 0 % (ref 0.0–0.2)

## 2021-06-21 LAB — COMPREHENSIVE METABOLIC PANEL
ALT: 32 U/L (ref 0–44)
AST: 12 U/L — ABNORMAL LOW (ref 15–41)
Albumin: 3.2 g/dL — ABNORMAL LOW (ref 3.5–5.0)
Alkaline Phosphatase: 75 U/L (ref 38–126)
Anion gap: 5 (ref 5–15)
BUN: 39 mg/dL — ABNORMAL HIGH (ref 8–23)
CO2: 37 mmol/L — ABNORMAL HIGH (ref 22–32)
Calcium: 8.8 mg/dL — ABNORMAL LOW (ref 8.9–10.3)
Chloride: 96 mmol/L — ABNORMAL LOW (ref 98–111)
Creatinine, Ser: 0.71 mg/dL (ref 0.44–1.00)
GFR, Estimated: >60 mL/min (ref >60)
Glucose, Bld: 121 mg/dL — ABNORMAL HIGH (ref 70–99)
Potassium: 5.1 mmol/L (ref 3.5–5.1)
Sodium: 138 mmol/L (ref 135–145)
Total Bilirubin: 0.7 mg/dL (ref 0.3–1.2)
Total Protein: 5.8 g/dL — ABNORMAL LOW (ref 6.5–8.1)

## 2021-06-21 LAB — GLUCOSE, CAPILLARY
Glucose-Capillary: 113 mg/dL — ABNORMAL HIGH (ref 70–99)
Glucose-Capillary: 130 mg/dL — ABNORMAL HIGH (ref 70–99)
Glucose-Capillary: 147 mg/dL — ABNORMAL HIGH (ref 70–99)
Glucose-Capillary: 157 mg/dL — ABNORMAL HIGH (ref 70–99)
Glucose-Capillary: 188 mg/dL — ABNORMAL HIGH (ref 70–99)
Glucose-Capillary: 211 mg/dL — ABNORMAL HIGH (ref 70–99)

## 2021-06-21 LAB — TRIGLYCERIDES: Triglycerides: 90 mg/dL (ref <150)

## 2021-06-21 LAB — FERRITIN: Ferritin: 41 ng/mL (ref 11–307)

## 2021-06-21 LAB — HEMOGLOBIN A1C
Hgb A1c MFr Bld: 6 % — ABNORMAL HIGH (ref 4.8–5.6)
Mean Plasma Glucose: 126 mg/dL

## 2021-06-21 LAB — D-DIMER, QUANTITATIVE: D-Dimer, Quant: 0.96 ug/mL-FEU — ABNORMAL HIGH (ref 0.00–0.50)

## 2021-06-21 LAB — C-REACTIVE PROTEIN: CRP: 1.8 mg/dL — ABNORMAL HIGH (ref <1.0)

## 2021-06-21 NOTE — Progress Notes (Signed)
NAMEAariyah Torres, MRN:  [id number], DOB:  1951/07/31, LOS: 9 ADMISSION DATE:  06/15/2021   BRIEF SYNOPSIS 70 year old female with a history of diastolic CHF as well as COPD and morbid obesity with pickwickian syndrome and obesity hypoventilation with chronic hypercapnia.  She also has a history of pulmonary hypertension, obstructive sleep apnea atrial fibrillation and essential hypertension.  She came into the hospital with complaints of acute onset dyspnea with hypoxemia.  Daughter was able to respond and was able to increase oxygen therapy as well as call EMS due to respiratory failure.  She was placed on BiPAP  Admitted or acute cor pulmonale and COPD exacerbation with diastolic heart failure    Significant Hospital Events: Including procedures, antibiotic start and stop dates in addition to other pertinent events   Admitted 10/10 for resp distress COVID NEG 10/17 placed on biPAP transferred to ICU COVID + 10/18 progressive hypoxia, severe resp failure,  10/18 intubated RT CVL placed      Micro Data:  COVID + on 10/17  Antimicrobials:   Antibiotics Given (last 72 hours)     Date/Time Action Medication Dose Rate   06/19/21 1800 New Bag/Given   remdesivir 200 mg in sodium chloride 0.9% 250 mL IVPB 200 mg 580 mL/hr   06/20/21 1135 New Bag/Given   remdesivir 100 mg in sodium chloride 0.9 % 100 mL IVPB 100 mg 200 mL/hr            Interim History / Subjective:  Remains critically ill Severe hypoxia End stage COPD   Vent Mode: PRVC FiO2 (%):  [50 %-60 %] 50 % Set Rate:  [14 bmp-16 bmp] 16 bmp Vt Set:  [430 mL-500 mL] 430 mL PEEP:  [10 cmH20] 10 cmH20 Plateau Pressure:  [24 cmH20-27 cmH20] 24 cmH20       Objective   Blood pressure 98/60, pulse 89, temperature (!) 97.5 F (36.4 C), resp. rate 16, height 5' 7" (1.702 m), weight (!) 142.7 kg, SpO2 96 %.    Vent Mode: PRVC FiO2 (%):  [50 %-60 %] 50 % Set Rate:  [14 bmp-16 bmp] 16 bmp Vt Set:  [430  mL-500 mL] 430 mL PEEP:  [10 cmH20] 10 cmH20 Plateau Pressure:  [24 cmH20-27 cmH20] 24 cmH20   Intake/Output Summary (Last 24 hours) at 06/21/2021 0751 Last data filed at 06/21/2021 0600 Gross per 24 hour  Intake 1478.54 ml  Output 800 ml  Net 678.54 ml   Filed Weights   06/17/21 0300 06/18/21 0511 06/19/21 0515  Weight: (!) 142.7 kg (!) 142.6 kg (!) 142.7 kg      REVIEW OF SYSTEMS  PATIENT IS UNABLE TO PROVIDE COMPLETE REVIEW OF SYSTEMS DUE TO SEVERE CRITICAL ILLNESS AND TOXIC METABOLIC ENCEPHALOPATHY  ALL OTHER ROS ARE NEGATIVE   PHYSICAL EXAMINATION:  GENERAL:critically ill appearing, +resp distress EYES: Pupils equal, round, reactive to light.  No scleral icterus.  MOUTH: Moist mucosal membrane. INTUBATED NECK: Supple.  PULMONARY: +rhonchi, +wheezing CARDIOVASCULAR: S1 and S2.  No murmurs  GASTROINTESTINAL: Soft, nontender, -distended. Positive bowel sounds.  MUSCULOSKELETAL: No swelling, clubbing, or edema.  NEUROLOGIC: obtunded SKIN:intact,warm,dry    Labs/imaging that I havepersonally reviewed  (right click and "Reselect all SmartList Selections" daily)      ASSESSMENT AND PLAN SYNOPSIS  70 yo morbidly obese white female with severe hypoxic resp failure ON MV support with COVID 19 infection and pneumonia in the setting of end stage COPD with cor pulmonale and acute diastolic heart failure  Severe ACUTE Hypoxic and Hypercapnic Respiratory Failure -continue Mechanical Ventilator support -continue Bronchodilator Therapy -Wean Fio2 and PEEP as tolerated -VAP/VENT bundle implementation -will NOT perform SAT/SBT when respiratory parameters are met  Vent Mode: PRVC FiO2 (%):  [50 %-60 %] 50 % Set Rate:  [14 bmp-16 bmp] 16 bmp Vt Set:  [430 mL-500 mL] 430 mL PEEP:  [10 cmH20] 10 cmH20 Plateau Pressure:  [24 cmH20-27 cmH20] 24 cmH20  Acute hypoxemic respiratory failure due to COVID-19 pneumonia/ARDS Mechanical ventilation via ARDS protocol, target  PRVC 6 cc/kg Wean PEEP and FiO2 as able Goal plateau pressure less than 30, driving pressure less than 15 Paralytics if necessary for vent synchrony, gas exchange Cycle prone positioning if necessary for oxygenation Deep sedation per PAD protocol diuresis as tolerated based on Kidney function VAP prevention order set Follow inflammatory markers as needed with CRP Vitamin C, zinc as indicated Plan to repeat and check resp cultures if needed  SEVERE COPD EXACERBATION -continue IV steroids as prescribed  CARDIAC FAILURE-acute cdiastolic dysfunction -oxygen as needed -Lasix as tolerated -follow up cardiac enzymes as indicated   CARDIAC ICU monitoring   KIDNEYS -continue Foley Catheter-assess need -Avoid nephrotoxic agents -Follow urine output, BMP -Ensure adequate renal perfusion, optimize oxygenation -Renal dose medications   Intake/Output Summary (Last 24 hours) at 06/21/2021 0751 Last data filed at 06/21/2021 0600 Gross per 24 hour  Intake 1478.54 ml  Output 800 ml  Net 678.54 ml     NEUROLOGY Acute toxic metabolic encephalopathy, need for sedation Goal RASS -2 to -3   INFECTIOUS DISEASE -continue antibiotics as prescribed -follow up cultures  ENDO - ICU hypoglycemic\Hyperglycemia protocol -check FSBS per protocol   GI GI PROPHYLAXIS as indicated  NUTRITIONAL STATUS DIET-->TF's as tolerated Constipation protocol as indicated   ELECTROLYTES -follow labs as needed -replace as needed -pharmacy consultation and following     Best practice (right click and "Reselect all SmartList Selections" daily)  Diet: NPO Pain/Anxiety/Delirium protocol (if indicated): Yes (RASS goal -2) VAP protocol (if indicated): Yes DVT prophylaxis: Systemic AC GI prophylaxis: PPI Glucose control:  SSI Yes Central venous access:  Yes, and it is still needed Arterial line:  Yes, and it is still needed Foley:  Yes, and it is still needed Mobility:  bed rest  Code  Status:  FULL Disposition:ICU Labs   CBC: Recent Labs  Lab 06/17/21 0652 06/18/21 0532 06/19/21 0518 06/20/21 0544 06/21/21 0500  WBC 14.1* 10.0 12.1* 11.3* 14.3*  NEUTROABS 11.8* 8.6* 10.0* 9.4* 12.4*  HGB 9.1* 8.9* 9.2* 9.7* 10.5*  HCT 29.9* 30.8* 30.3* 32.8* 34.7*  MCV 91.4 89.5 88.6 91.9 89.7  PLT 237 243 252 274 024    Basic Metabolic Panel: Recent Labs  Lab 06/16/21 0813 06/17/21 0652 06/18/21 0532 06/19/21 0518 06/19/21 0750 06/21/21 0500  NA 140 140 137  --  137 138  K 4.7 4.7 4.9 4.6 4.7 5.1  CL 98 98 97*  --  95* 96*  CO2 34* 36* 35*  --  34* 37*  GLUCOSE 164* 133* 157*  --  117* 121*  BUN 34* 34* 33*  --  33* 39*  CREATININE 0.92 0.84 0.76  --  0.78 0.71  CALCIUM 9.1 8.9 8.7*  --  8.9 8.8*   GFR: Estimated Creatinine Clearance: 98.5 mL/min (by C-G formula based on SCr of 0.71 mg/dL). Recent Labs  Lab 06/18/21 0532 06/19/21 0518 06/20/21 0544 06/20/21 1027 06/21/21 0500  PROCALCITON  --   --   --  <0.10  --  WBC 10.0 12.1* 11.3*  --  14.3*    Liver Function Tests: Recent Labs  Lab 06/21/21 0500  AST 12*  ALT 32  ALKPHOS 75  BILITOT 0.7  PROT 5.8*  ALBUMIN 3.2*   ABG    Component Value Date/Time   PHART 7.46 (H) 06/20/2021 1043   PCO2ART 62 (H) 06/20/2021 1043   PO2ART 227 (H) 06/20/2021 1043   HCO3 44.1 (H) 06/20/2021 1043   O2SAT 99.8 06/20/2021 1043   HbA1C: Hgb A1c MFr Bld  Date/Time Value Ref Range Status  06/20/2021 05:44 AM 6.0 (H) 4.8 - 5.6 % Final    Comment:    (NOTE)         Prediabetes: 5.7 - 6.4         Diabetes: >6.4         Glycemic control for adults with diabetes: <7.0   01/19/2020 08:37 AM 5.7 (H) 4.8 - 5.6 % Final    Comment:    (NOTE)         Prediabetes: 5.7 - 6.4         Diabetes: >6.4         Glycemic control for adults with diabetes: <7.0     CBG: Recent Labs  Lab 06/20/21 0331 06/20/21 1603 06/20/21 2014 06/21/21 0004 06/21/21 0500  GLUCAP 97 130* 181* 157* 113*     Allergies Allergies  Allergen Reactions   Codeine     Back Headache   Sulfa Antibiotics     Reported to pt from mother who is no longer living -  Details unknown       DVT/GI PRX  assessed I Assessed the need for Labs I Assessed the need for Foley I Assessed the need for Central Venous Line Family Discussion when available I Assessed the need for Mobilization I made an Assessment of medications to be adjusted accordingly Safety Risk assessment completed  CASE DISCUSSED IN MULTIDISCIPLINARY ROUNDS WITH ICU TEAM     Critical Care Time devoted to patient care services described in this note is 55 minutes.  Critical care was necessary to treat or prevent imminent or life-threatening deterioration.   PATIENT WITH VERY POOR PROGNOSIS I ANTICIPATE PROLONGED ICU LOS  Patient with Multiorgan failure and at high risk for cardiac arrest and death.    Corrin Parker, M.D.  [facility]  Medical Director [place] Director [facility] Cardio-Pulmonary Department

## 2021-06-21 NOTE — Progress Notes (Signed)
Daily Progress Note   Patient Name: Paula Torres       Date: 06/21/2021 DOB: 1951-04-30  Age: 70 y.o. MRN#: [id number] Attending Physician: Flora Lipps, MD Primary Care Physician: Rusty Aus, MD Admit Date: 06/25/2021  Reason for Consultation/Follow-up: Establishing goals of care  Subjective: Spoke with daughter. She said she has covid, and was tested Sunday. She states she is aware of her mother's wishes about not being on a ventilator long term. Discussed a family meeting. She states her father could come if her brother brings him. Spoke with husband. Plans for a family meeting tomorrow at 12:00.    Length of Stay: 9  Current Medications: Scheduled Meds:   apixaban  5 mg Per Tube BID   atorvastatin  20 mg Per Tube QHS   bisoprolol  10 mg Per Tube Daily   Chlorhexidine Gluconate Cloth  6 each Topical Daily   collagenase   Topical Daily   diltiazem  90 mg Per Tube Q6H   docusate  100 mg Per Tube BID   famotidine  20 mg Per Tube BID   feeding supplement (PROSource TF)  45 mL Per Tube BID   ferrous gluconate  324 mg Oral Weekly   folic acid  1 mg Per Tube Daily   free water  30 mL Per Tube Q4H   insulin aspart  0-20 Units Subcutaneous Q4H   ipratropium  0.5 mg Nebulization Q6H   levalbuterol  0.63 mg Nebulization Q6H   loratadine  10 mg Per Tube Daily   midodrine  10 mg Per Tube TID WC   montelukast  10 mg Per Tube QHS   nutrition supplement (JUVEN)  1 packet Per Tube BID BM   polyethylene glycol  17 g Per Tube Daily   predniSONE  50 mg Per Tube Q breakfast   Treprostinil Diolamine ER  0.25 mg Oral BID    Continuous Infusions:  feeding supplement (VITAL HIGH PROTEIN) 60 mL/hr at 06/21/21 1200   fentaNYL infusion INTRAVENOUS 50 mcg/hr (06/21/21 1200)   norepinephrine  (LEVOPHED) Adult infusion 4 mcg/min (06/21/21 1200)   propofol (DIPRIVAN) infusion 20 mcg/kg/min (06/21/21 1356)   remdesivir 100 mg in NS 100 mL Stopped (06/21/21 1156)    PRN Meds: acetaminophen, clonazePAM, dextromethorphan-guaiFENesin, docusate sodium, fentaNYL, midazolam, midazolam, ondansetron (ZOFRAN) IV  Physical  Exam Constitutional:      Comments: Eyes closed. On ventilator.             Vital Signs: BP 102/73   Pulse 72   Temp 97.7 F (36.5 C)   Resp 16   Ht _0  (1.702 m)   Wt (!) 142.7 kg   SpO2 96%   BMI 49.27 kg/m  SpO2: SpO2: 96 % O2 Device: O2 Device: Ventilator O2 Flow Rate: O2 Flow Rate (L/min): 4 L/min  Intake/output summary:  Intake/Output Summary (Last 24 hours) at 06/21/2021 1403 Last data filed at 06/21/2021 1200 Gross per 24 hour  Intake 1712.81 ml  Output 800 ml  Net 912.81 ml   LBM: Last BM Date: 06/20/21 Baseline Weight: Weight: (!) 140 kg Most recent weight: Weight: (!) 142.7 kg  Flowsheet Rows    Flowsheet Row Most Recent Value  Intake Tab   Referral Department Hospitalist  Unit at Time of Referral Cardiac/Telemetry Unit  Palliative Care Primary Diagnosis Cardiac  Date Notified 06/13/21  Palliative Care Type Return patient Palliative Care  Reason for referral Clarify Goals of Care  Date of Admission 06/05/2021  Date first seen by Palliative Care 06/13/21  # of days Palliative referral response time 0 Day(s)  # of days IP prior to Palliative referral 1  Clinical Assessment   Palliative Performance Scale Score 40%  Pain Max last 24 hours Not able to report  Pain Min Last 24 hours Not able to report  Dyspnea Max Last 24 Hours Not able to report  Dyspnea Min Last 24 hours Not able to report  Psychosocial & Spiritual Assessment   Palliative Care Outcomes        Patient Active Problem List   Diagnosis Date Noted   Pressure injury of skin 06/14/2021   HCAP (healthcare-associated pneumonia) 06/21/2021   PAF (paroxysmal atrial  fibrillation) (HCC)    OSA (obstructive sleep apnea)    Sepsis ([place])    CAD (coronary artery disease)    Wound infection_left lower leg 05/28/2021   COPD (chronic obstructive pulmonary disease) ([place]) 05/28/2021   Chronic diastolic CHF (congestive heart failure) ([place]) 05/28/2021   Iron deficiency anemia 05/28/2021   Cellulitis of left lower leg 05/28/2021   Acute on chronic respiratory failure ([place]) 04/25/2021   Acute on chronic respiratory failure with hypoxia and hypercapnia (HCC) 05/11/2020   AF (paroxysmal atrial fibrillation) ([place]) 05/11/2020   Acute on chronic diastolic congestive heart failure (HCC)    Acute respiratory distress    Pulmonary hypertension ([place])    Restless leg syndrome    Depression    Atrial fibrillation with RVR ([place]) 01/21/2020   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[place]) 01/21/2020   Abnormal cardiovascular stress test    Demand ischemia (HCC)    Hyperlipidemia    COPD with acute exacerbation ([place]) 11/08/2019   Acute on chronic diastolic CHF (congestive heart failure) ([place]) 11/08/2019   Acute respiratory failure with hypoxia and hypercapnia (HCC) 11/08/2019   Obesity, Class III, BMI 40-49.9 (morbid obesity) ([place]) 11/08/2019   Elevated troponin 11/08/2019    Palliative Care Assessment & Plan   Recommendations/Plan: Family meeting tomorrow at 12:00    Code Status:    Code Status Orders  (From admission, onward)           Start     Ordered   06/13/21 0121  Full code  Continuous        06/13/21 0121  Code Status History     Date Active Date Inactive Code Status Order ID Comments User Context   05/28/2021 2023 06/08/2021 2152 Full Code [id number]  Ivor Costa, MD ED   04/25/2021 [id number] 04/28/2021 0010 Full Code [id number]  Collier Bullock, MD ED   05/09/2020 2150 05/12/2020 2301 Full Code [id number]  Mansy, Arvella Merles, MD ED   01/19/2020 0541 01/22/2020 2100 Full Code [id number]  Tonye Royalty, NP ED   11/08/2019 2209 11/10/2019 2131  Full Code [id number]  Toy Baker, MD Inpatient      Advance Directive Documentation    Flowsheet Row Most Recent Value  Type of Advance Directive Healthcare Power of Attorney  Pre-existing out of facility DNR order (yellow form or pink MOST form) --  "MOST" Form in Place? --    Thank you for allowing the Palliative Medicine Team to assist in the care of this patient.       Total Time 30 min Prolonged Time Billed  no       Greater than 50%  of this time was spent counseling and coordinating care related to the above assessment and plan.  Asencion Gowda, NP  Please contact Palliative Medicine Team phone at [phone] for questions and concerns.

## 2021-06-22 DIAGNOSIS — J189 Pneumonia, unspecified organism: Secondary | ICD-10-CM | POA: Diagnosis not present

## 2021-06-22 LAB — FERRITIN: Ferritin: 44 ng/mL (ref 11–307)

## 2021-06-22 LAB — COMPREHENSIVE METABOLIC PANEL
ALT: 25 U/L (ref 0–44)
AST: 11 U/L — ABNORMAL LOW (ref 15–41)
Albumin: 3.4 g/dL — ABNORMAL LOW (ref 3.5–5.0)
Alkaline Phosphatase: 73 U/L (ref 38–126)
Anion gap: 6 (ref 5–15)
BUN: 45 mg/dL — ABNORMAL HIGH (ref 8–23)
CO2: 35 mmol/L — ABNORMAL HIGH (ref 22–32)
Calcium: 8.8 mg/dL — ABNORMAL LOW (ref 8.9–10.3)
Chloride: 95 mmol/L — ABNORMAL LOW (ref 98–111)
Creatinine, Ser: 0.56 mg/dL (ref 0.44–1.00)
GFR, Estimated: >60 mL/min (ref >60)
Glucose, Bld: 147 mg/dL — ABNORMAL HIGH (ref 70–99)
Potassium: 4.8 mmol/L (ref 3.5–5.1)
Sodium: 136 mmol/L (ref 135–145)
Total Bilirubin: 0.7 mg/dL (ref 0.3–1.2)
Total Protein: 6.3 g/dL — ABNORMAL LOW (ref 6.5–8.1)

## 2021-06-22 LAB — CBC WITH DIFFERENTIAL/PLATELET
Abs Immature Granulocytes: 0.19 10*3/uL — ABNORMAL HIGH (ref 0.00–0.07)
Basophils Absolute: 0 10*3/uL (ref 0.0–0.1)
Basophils Relative: 0 %
Eosinophils Absolute: 0 10*3/uL (ref 0.0–0.5)
Eosinophils Relative: 0 %
HCT: 31.1 % — ABNORMAL LOW (ref 36.0–46.0)
Hemoglobin: 9.6 g/dL — ABNORMAL LOW (ref 12.0–15.0)
Immature Granulocytes: 1 %
Lymphocytes Relative: 4 %
Lymphs Abs: 0.6 10*3/uL — ABNORMAL LOW (ref 0.7–4.0)
MCH: 27.5 pg (ref 26.0–34.0)
MCHC: 30.9 g/dL (ref 30.0–36.0)
MCV: 89.1 fL (ref 80.0–100.0)
Monocytes Absolute: 1.2 10*3/uL — ABNORMAL HIGH (ref 0.1–1.0)
Monocytes Relative: 8 %
Neutro Abs: 13.7 10*3/uL — ABNORMAL HIGH (ref 1.7–7.7)
Neutrophils Relative %: 87 %
Platelets: 273 10*3/uL (ref 150–400)
RBC: 3.49 MIL/uL — ABNORMAL LOW (ref 3.87–5.11)
RDW: 17.7 % — ABNORMAL HIGH (ref 11.5–15.5)
WBC: 15.8 10*3/uL — ABNORMAL HIGH (ref 4.0–10.5)
nRBC: 0.1 % (ref 0.0–0.2)

## 2021-06-22 LAB — GLUCOSE, CAPILLARY
Glucose-Capillary: 186 mg/dL — ABNORMAL HIGH (ref 70–99)
Glucose-Capillary: 147 mg/dL — ABNORMAL HIGH (ref 70–99)
Glucose-Capillary: 159 mg/dL — ABNORMAL HIGH (ref 70–99)
Glucose-Capillary: 145 mg/dL — ABNORMAL HIGH (ref 70–99)

## 2021-06-22 LAB — D-DIMER, QUANTITATIVE: D-Dimer, Quant: 1.31 ug/mL-FEU — ABNORMAL HIGH (ref 0.00–0.50)

## 2021-06-22 LAB — MAGNESIUM: Magnesium: 2.2 mg/dL (ref 1.7–2.4)

## 2021-06-22 LAB — PHOSPHORUS: Phosphorus: 3.3 mg/dL (ref 2.5–4.6)

## 2021-06-22 MED ORDER — GLYCOPYRROLATE 0.2 MG/ML IJ SOLN: 0.2000 mg | INTRAMUSCULAR | Status: DC | PRN

## 2021-06-22 MED ORDER — LEVALBUTEROL HCL 0.63 MG/3ML IN NEBU: 0.6300 mg | INHALATION_SOLUTION | Freq: Two times a day (BID) | RESPIRATORY_TRACT | Status: DC

## 2021-06-22 MED ORDER — ACETAZOLAMIDE 250 MG PO TABS
500.0000 mg | ORAL_TABLET | ORAL | Status: AC
  Administered 2021-06-22: 500 mg
  Filled 2021-06-22: qty 2

## 2021-06-22 MED ORDER — MORPHINE SULFATE (PF) 2 MG/ML IV SOLN: 2.0000 mg | INTRAVENOUS | Status: DC | PRN

## 2021-06-22 MED ORDER — ACETAMINOPHEN 325 MG PO TABS: 650.0000 mg | ORAL_TABLET | Freq: Four times a day (QID) | ORAL | Status: DC | PRN

## 2021-06-22 MED ORDER — DIPHENHYDRAMINE HCL 50 MG/ML IJ SOLN: 25.0000 mg | INTRAMUSCULAR | Status: DC | PRN

## 2021-06-22 MED ORDER — MORPHINE 100MG IN NS 100ML (1MG/ML) PREMIX INFUSION
0.0000 mg/h | INTRAVENOUS | Status: DC
  Administered 2021-06-22: 5 mg/h via INTRAVENOUS
  Filled 2021-06-22: qty 100

## 2021-06-22 MED ORDER — ACETAMINOPHEN 650 MG RE SUPP: 650.0000 mg | Freq: Four times a day (QID) | RECTAL | Status: DC | PRN

## 2021-06-22 MED ORDER — IPRATROPIUM BROMIDE 0.02 % IN SOLN: 0.5000 mg | Freq: Two times a day (BID) | RESPIRATORY_TRACT | Status: DC

## 2021-06-22 MED ORDER — DEXTROSE 5 % IV SOLN: INTRAVENOUS | Status: DC

## 2021-06-22 MED ORDER — GLYCOPYRROLATE 1 MG PO TABS
1.0000 mg | ORAL_TABLET | ORAL | Status: DC | PRN
  Filled 2021-06-22: qty 1

## 2021-06-22 MED ORDER — POLYVINYL ALCOHOL 1.4 % OP SOLN
1.0000 [drp] | Freq: Four times a day (QID) | OPHTHALMIC | Status: DC | PRN
  Filled 2021-06-22: qty 15

## 2021-06-22 MED ORDER — MORPHINE BOLUS VIA INFUSION
5.0000 mg | INTRAVENOUS | Status: DC | PRN
  Filled 2021-06-22: qty 5

## 2021-06-22 MED ORDER — MIDAZOLAM HCL 2 MG/2ML IJ SOLN
2.0000 mg | INTRAMUSCULAR | Status: DC | PRN
  Administered 2021-06-22: 4 mg via INTRAVENOUS
  Filled 2021-06-22: qty 4

## 2021-06-22 MED ORDER — ORAL CARE MOUTH RINSE
15.0000 mL | OROMUCOSAL | Status: DC
  Administered 2021-06-22: 15 mL via OROMUCOSAL

## 2021-06-22 NOTE — Plan of Care (Signed)
Family has withdrawn on patient and made a DNR.  Proceeding with comfort orders.

## 2021-06-22 NOTE — Progress Notes (Signed)
Assisted tele visit to patient with daughters.  Maryelizabeth Rowan, RN

## 2021-06-22 NOTE — Progress Notes (Signed)
Daily Progress Note   Patient Name: Paula Torres       Date: 06/22/2021 DOB: [birth year]  Age: 70 y.o. MRN#: [id number] Attending Physician: Flora Lipps, MD Primary Care Physician: Rusty Aus, MD Admit Date: 06/06/2021  Reason for Consultation/Follow-up: Establishing goals of care  Subjective: Patient is intubated. Met with husband, son and daughter in law. Husband was rolled to room glass door so he could see in as patient has covid. Discussed patient wishes. They confirm she would only want ventilator support for a very short term, and only if it would help. Husband  quickly states he does not want her to continue to suffer and wants to allow her to die. He states "she would not want to be hooked up to all that". Husband states he has to leave as he is here from a SNF himself. He states if she dies at this point, he is at peace with this. Discussed other family who would want to see her. He would like to allow that. He would however like to make her DNR at this time and states "CPR would be prolonging the inevitable".    Stepped out with DIL to talk to patient's daughter via face time. Discussed status. We discussed her respiratory and functional status at baseline which the family describes as poor. We shared concerns for weaning her from ventilator and what the future may look like if she were weaned. She discusses that she does not want to use CPAP/BIPAP at baseline. She is at peace with decision to shift to comfort care.   I completed a MOST form today  with husband and the signed original was placed in the chart. A photocopy was placed in the chart to be scanned into EMR. The patient outlined their wishes for the following treatment decisions:  Cardiopulmonary Resuscitation: Do Not  Attempt Resuscitation (DNR/No CPR)  Medical Interventions: Comfort Measures: Keep clean, warm, and dry. Use medication by any route, positioning, wound care, and other measures to relieve pain and suffering. Use oxygen, suction and manual treatment of airway obstruction as needed for comfort. Do not transfer to the hospital unless comfort needs cannot be met in current location.  Antibiotics: No antibiotics (use other measures to relieve symptoms)  IV Fluids: No IV fluids (provide other measures to ensure comfort)  Feeding Tube:  No feeding tube        Spoke with family further to answer questions.   Length of Stay: 10  Current Medications: Scheduled Meds:   apixaban  5 mg Per Tube BID   atorvastatin  20 mg Per Tube QHS   bisoprolol  10 mg Per Tube Daily   Chlorhexidine Gluconate Cloth  6 each Topical Daily   collagenase   Topical Daily   diltiazem  90 mg Per Tube Q6H   docusate  100 mg Per Tube BID   famotidine  20 mg Per Tube BID   feeding supplement (PROSource TF)  45 mL Per Tube BID   ferrous gluconate  324 mg Oral Weekly   folic acid  1 mg Per Tube Daily   free water  30 mL Per Tube Q4H   insulin aspart  0-20 Units Subcutaneous Q4H   ipratropium  0.5 mg Nebulization BID   levalbuterol  0.63 mg Nebulization BID   loratadine  10 mg Per Tube Daily   mouth rinse  15 mL Mouth Rinse 10 times per day   midodrine  10 mg Per Tube TID WC   montelukast  10 mg Per Tube QHS   nutrition supplement (JUVEN)  1 packet Per Tube BID BM   polyethylene glycol  17 g Per Tube Daily   predniSONE  50 mg Per Tube Q breakfast   Treprostinil Diolamine ER  0.25 mg Oral BID    Continuous Infusions:  feeding supplement (VITAL HIGH PROTEIN) 1,000 mL (06/22/21 0820)   fentaNYL infusion INTRAVENOUS 50 mcg/hr (06/22/21 1008)   norepinephrine (LEVOPHED) Adult infusion Stopped (06/22/21 0829)   propofol (DIPRIVAN) infusion 20 mcg/kg/min (06/22/21 1308)   remdesivir 100 mg in NS 100 mL Stopped (06/22/21  0858)    PRN Meds: acetaminophen, clonazePAM, dextromethorphan-guaiFENesin, docusate sodium, fentaNYL, midazolam, midazolam, ondansetron (ZOFRAN) IV  Physical Exam Constitutional:      Comments: On ventilator.             Vital Signs: BP 101/61   Pulse (!) 53   Temp 97.7 F (36.5 C)   Resp 16   Ht _0  (1.702 m)   Wt (!) 142.7 kg   SpO2 95%   BMI 49.27 kg/m  SpO2: SpO2: 95 % O2 Device: O2 Device: Ventilator O2 Flow Rate: O2 Flow Rate (L/min): 4 L/min  Intake/output summary:  Intake/Output Summary (Last 24 hours) at 06/22/2021 1309 Last data filed at 06/22/2021 1008 Gross per 24 hour  Intake 641.8 ml  Output 650 ml  Net -8.2 ml   LBM: Last BM Date: 06/20/21 Baseline Weight: Weight: (!) 140 kg Most recent weight: Weight: (!) 142.7 kg       Flowsheet Rows    Flowsheet Row Most Recent Value  Intake Tab   Referral Department Hospitalist  Unit at Time of Referral Cardiac/Telemetry Unit  Palliative Care Primary Diagnosis Cardiac  Date Notified 06/13/21  Palliative Care Type Return patient Palliative Care  Reason for referral Clarify Goals of Care  Date of Admission 06/21/2021  Date first seen by Palliative Care 06/13/21  # of days Palliative referral response time 0 Day(s)  # of days IP prior to Palliative referral 1  Clinical Assessment   Palliative Performance Scale Score 40%  Pain Max last 24 hours Not able to report  Pain Min Last 24 hours Not able to report  Dyspnea Max Last 24 Hours Not able to report  Dyspnea Min Last 24 hours Not able to report  Psychosocial & Spiritual Assessment   Palliative Care Outcomes        Patient Active Problem List   Diagnosis Date Noted   Pressure injury of skin 06/14/2021   HCAP (healthcare-associated pneumonia) 06/21/2021   PAF (paroxysmal atrial fibrillation) (HCC)    OSA (obstructive sleep apnea)    Sepsis (HCC)    CAD (coronary artery disease)    Wound infection_left lower leg 05/28/2021   COPD (chronic  obstructive pulmonary disease) ([place]) 05/28/2021   Chronic diastolic CHF (congestive heart failure) ([place]) 05/28/2021   Iron deficiency anemia 05/28/2021   Cellulitis of left lower leg 05/28/2021   Acute on chronic respiratory failure ([place]) 04/25/2021   Acute on chronic respiratory failure with hypoxia and hypercapnia (HCC) 05/11/2020   AF (paroxysmal atrial fibrillation) ([place]) 05/11/2020   Acute on chronic diastolic congestive heart failure (HCC)    Acute respiratory distress    Pulmonary hypertension (HCC)    Restless leg syndrome    Depression    Atrial fibrillation with RVR ([place]) 01/21/2020   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[place]) 01/21/2020   Abnormal cardiovascular stress test    Demand ischemia (HCC)    Hyperlipidemia    COPD with acute exacerbation ([place]) 11/08/2019   Acute on chronic diastolic CHF (congestive heart failure) ([place]) 11/08/2019   Acute respiratory failure with hypoxia and hypercapnia (HCC) 11/08/2019   Obesity, Class III, BMI 40-49.9 (morbid obesity) ([place]) 11/08/2019   Elevated troponin 11/08/2019    Palliative Care Assessment & Plan   Recommendations/Plan: Shift to DNR now.  Shift to full comfort care when family is ready.    Code Status:    Code Status Orders  (From admission, onward)           Start     Ordered   06/13/21 0121  Full code  Continuous        06/13/21 0121           Code Status History     Date Active Date Inactive Code Status Order ID Comments User Context   05/28/2021 2023 06/08/2021 2152 Full Code [id number]  Ivor Costa, MD ED   04/25/2021 0936 04/28/2021 0010 Full Code [id number]  Collier Bullock, MD ED   05/09/2020 2150 05/12/2020 2301 Full Code [id number]  Mansy, Arvella Merles, MD ED   01/19/2020 0541 01/22/2020 2100 Full Code [id number]  Tonye Royalty, NP ED   11/08/2019 2209 11/10/2019 2131 Full Code [id number]  Toy Baker, MD Inpatient      Advance Directive Documentation    Flowsheet Row Most Recent Value   Type of Advance Directive Healthcare Power of Attorney  Pre-existing out of facility DNR order (yellow form or pink MOST form) --  "MOST" Form in Place? --       Prognosis:  Hours - Days  Thank you for allowing the Palliative Medicine Team to assist in the care of this patient.   Time In: 12:00 Time Out: 1:30 Total Time 90 min Prolonged Time Billed  yes       Greater than 50%  of this time was spent counseling and coordinating care related to the above assessment and plan.  Asencion Gowda, NP  Please contact Palliative Medicine Team phone at [phone] for questions and concerns.

## 2021-06-22 NOTE — Progress Notes (Signed)
Patient's daughter took all belongings with her.

## 2021-06-22 NOTE — Progress Notes (Signed)
 NAME:  Paula Torres, MRN:  [id number], DOB:  05/25/1951, LOS: 10 ADMISSION DATE:  06/10/2021   BRIEF SYNOPSIS 70-year-old female with a history of diastolic CHF as well as COPD and morbid obesity with pickwickian syndrome and obesity hypoventilation with chronic hypercapnia.  She also has a history of pulmonary hypertension, obstructive sleep apnea atrial fibrillation and essential hypertension.  She came into the hospital with complaints of acute onset dyspnea with hypoxemia.  Daughter was able to respond and was able to increase oxygen therapy as well as call EMS due to respiratory failure.  She was placed on BiPAP  Admitted or acute cor pulmonale and COPD exacerbation with diastolic heart failure    Significant Hospital Events: Including procedures, antibiotic start and stop dates in addition to other pertinent events   Admitted 10/10 for resp distress COVID NEG 10/17 placed on biPAP transferred to ICU COVID + 10/18 progressive hypoxia, severe resp failure,  10/18 intubated RT CVL placed 10/19 severe hypoxia, remains on vent      Micro Data:  COVID + on 10/17  Antimicrobials:   Antibiotics Given (last 72 hours)     Date/Time Action Medication Dose Rate   06/19/21 1800 New Bag/Given   remdesivir 200 mg in sodium chloride 0.9% 250 mL IVPB 200 mg 580 mL/hr   06/20/21 1135 New Bag/Given   remdesivir 100 mg in sodium chloride 0.9 % 100 mL IVPB 100 mg 200 mL/hr   06/21/21 1125 New Bag/Given   remdesivir 100 mg in sodium chloride 0.9 % 100 mL IVPB 100 mg 200 mL/hr   06/22/21 0828 New Bag/Given   remdesivir 100 mg in sodium chloride 0.9 % 100 mL IVPB 100 mg 200 mL/hr            Interim History / Subjective:  End stage COPD COR PULMONALE Severe hypoxia Remains on vent     Vent Mode: PRVC FiO2 (%):  [40 %-50 %] 50 % Set Rate:  [16 bmp] 16 bmp Vt Set:  [430 mL] 430 mL PEEP:  [8 cmH20-10 cmH20] 8 cmH20 Plateau Pressure:  [18 cmH20-19 cmH20] 18  cmH20       Objective   Blood pressure 114/63, pulse 84, temperature 98.4 F (36.9 C), resp. rate 17, height 5' 7" (1.702 m), weight (!) 142.7 kg, SpO2 92 %.    Vent Mode: PRVC FiO2 (%):  [40 %-50 %] 50 % Set Rate:  [16 bmp] 16 bmp Vt Set:  [430 mL] 430 mL PEEP:  [8 cmH20-10 cmH20] 8 cmH20 Plateau Pressure:  [18 cmH20-19 cmH20] 18 cmH20   Intake/Output Summary (Last 24 hours) at 06/22/2021 0917 Last data filed at 06/22/2021 0000 Gross per 24 hour  Intake 391.99 ml  Output 850 ml  Net -458.01 ml    Filed Weights   06/17/21 0300 06/18/21 0511 06/19/21 0515  Weight: (!) 142.7 kg (!) 142.6 kg (!) 142.7 kg    REVIEW OF SYSTEMS  PATIENT IS UNABLE TO PROVIDE COMPLETE REVIEW OF SYSTEMS DUE TO SEVERE CRITICAL ILLNESS AND TOXIC METABOLIC ENCEPHALOPATHY   PHYSICAL EXAMINATION:  GENERAL:critically ill appearing, +resp distress EYES: Pupils equal, round, reactive to light.  No scleral icterus.  MOUTH: Moist mucosal membrane. INTUBATED NECK: Supple.  PULMONARY: +rhonchi, +wheezing CARDIOVASCULAR: S1 and S2.  No murmurs  GASTROINTESTINAL: Soft, nontender, -distended. Positive bowel sounds.  MUSCULOSKELETAL: No swelling, clubbing, or edema.  NEUROLOGIC: obtunded SKIN:intact,warm,dry    Labs/imaging that I havepersonally reviewed  (right click and "Reselect all SmartList Selections" daily)        ASSESSMENT AND PLAN SYNOPSIS  70 yo morbidly obese white female with severe hypoxic resp failure ON MV support with COVID 19 infection and pneumonia in the setting of end stage COPD with cor pulmonale and acute diastolic heart failure   Severe ACUTE Hypoxic and Hypercapnic Respiratory Failure -continue Mechanical Ventilator support -continue Bronchodilator Therapy -Wean Fio2 and PEEP as tolerated -VAP/VENT bundle implementation -will NOT perform SAT/SBT when respiratory parameters are met Severe hypoxia  Vent Mode: PRVC FiO2 (%):  [40 %-50 %] 50 % Set Rate:  [16 bmp] 16  bmp Vt Set:  [430 mL] 430 mL PEEP:  [8 cmH20-10 cmH20] 8 cmH20 Plateau Pressure:  [18 cmH20-19 cmH20] 18 cmH20  Acute hypoxemic respiratory failure due to COVID-19 pneumonia/ARDS Mechanical ventilation via ARDS protocol, target PRVC 6 cc/kg Wean PEEP and FiO2 as able Goal plateau pressure less than 30, driving pressure less than 15 Paralytics if necessary for vent synchrony, gas exchange Cycle prone positioning if necessary for oxygenation Deep sedation per PAD protocol diuresis as tolerated based on Kidney function VAP prevention order set Follow inflammatory markers as needed with CRP Vitamin C, zinc as indicated Plan to repeat and check resp cultures if needed  SEVERE COPD EXACERBATION -continue IV steroids as prescribed  ACUTE DIASTOLIC CARDIAC FAILURE- -oxygen as needed -Lasix as tolerated -follow up cardiac enzymes as indicated   CARDIAC ICU monitoring  KIDNEYS -continue Foley Catheter-assess need -Avoid nephrotoxic agents -Follow urine output, BMP -Ensure adequate renal perfusion, optimize oxygenation -Renal dose medications   Intake/Output Summary (Last 24 hours) at 06/22/2021 0919 Last data filed at 06/22/2021 0000 Gross per 24 hour  Intake 391.99 ml  Output 850 ml  Net -458.01 ml      NEUROLOGY ACUTE TOXIC METABOLIC ENCEPHALOPATHY -need for sedation -Goal RASS -2 to -3  INFECTIOUS DISEASE -continue antibiotics as prescribed -follow up cultures  ENDO - ICU hypoglycemic\Hyperglycemia protocol -check FSBS per protocol   GI GI PROPHYLAXIS as indicated  NUTRITIONAL STATUS DIET-->TF's as tolerated Constipation protocol as indicated  ELECTROLYTES -follow labs as needed -replace as needed -pharmacy consultation and following     Best practice (right click and "Reselect all SmartList Selections" daily)  Diet: NPO Pain/Anxiety/Delirium protocol (if indicated): Yes (RASS goal -2) VAP protocol (if indicated): Yes DVT prophylaxis: Systemic  AC GI prophylaxis: PPI Glucose control:  SSI Yes Central venous access:  Yes, and it is still needed Arterial line:  Yes, and it is still needed Foley:  Yes, and it is still needed Mobility:  bed rest  Code Status:  FULL Disposition:ICU Labs   CBC: Recent Labs  Lab 06/17/21 0652 06/18/21 0532 06/19/21 0518 06/20/21 0544 06/21/21 0500  WBC 14.1* 10.0 12.1* 11.3* 14.3*  NEUTROABS 11.8* 8.6* 10.0* 9.4* 12.4*  HGB 9.1* 8.9* 9.2* 9.7* 10.5*  HCT 29.9* 30.8* 30.3* 32.8* 34.7*  MCV 91.4 89.5 88.6 91.9 89.7  PLT 237 243 252 274 342     Basic Metabolic Panel: Recent Labs  Lab 06/17/21 0652 06/18/21 0532 06/19/21 0518 06/19/21 0750 06/21/21 0500 06/22/21 0613  NA 140 137  --  137 138 136  K 4.7 4.9 4.6 4.7 5.1 4.8  CL 98 97*  --  95* 96* 95*  CO2 36* 35*  --  34* 37* 35*  GLUCOSE 133* 157*  --  117* 121* 147*  BUN 34* 33*  --  33* 39* 45*  CREATININE 0.84 0.76  --  0.78 0.71 0.56  CALCIUM 8.9 8.7*  --  8.9 8.8* 8.8*  MG  --   --   --   --   --  2.2  PHOS  --   --   --   --   --  3.3    GFR: Estimated Creatinine Clearance: 98.5 mL/min (by C-G formula based on SCr of 0.56 mg/dL). Recent Labs  Lab 06/18/21 0532 06/19/21 0518 06/20/21 0544 06/20/21 1027 06/21/21 0500  PROCALCITON  --   --   --  <0.10  --   WBC 10.0 12.1* 11.3*  --  14.3*     Liver Function Tests: Recent Labs  Lab 06/21/21 0500 06/22/21 0613  AST 12* 11*  ALT 32 25  ALKPHOS 75 73  BILITOT 0.7 0.7  PROT 5.8* 6.3*  ALBUMIN 3.2* 3.4*    ABG    Component Value Date/Time   PHART 7.46 (H) 06/20/2021 1043   PCO2ART 62 (H) 06/20/2021 1043   PO2ART 227 (H) 06/20/2021 1043   HCO3 44.1 (H) 06/20/2021 1043   O2SAT 99.8 06/20/2021 1043    HbA1C: Hgb A1c MFr Bld  Date/Time Value Ref Range Status  06/20/2021 05:44 AM 6.0 (H) 4.8 - 5.6 % Final    Comment:    (NOTE)         Prediabetes: 5.7 - 6.4         Diabetes: >6.4         Glycemic control for adults with diabetes: <7.0   01/19/2020  08:37 AM 5.7 (H) 4.8 - 5.6 % Final    Comment:    (NOTE)         Prediabetes: 5.7 - 6.4         Diabetes: >6.4         Glycemic control for adults with diabetes: <7.0     CBG: Recent Labs  Lab 06/21/21 1703 06/21/21 1940 06/22/21 0055 06/22/21 0502 06/22/21 0800  GLUCAP 211* 188* 186* 159* 147*     Allergies Allergies  Allergen Reactions   Codeine     Back Headache   Sulfa Antibiotics     Reported to pt from mother who is no longer living -  Details unknown        DVT/GI PRX  assessed I Assessed the need for Labs I Assessed the need for Foley I Assessed the need for Central Venous Line Family Discussion when available I Assessed the need for Mobilization I made an Assessment of medications to be adjusted accordingly Safety Risk assessment completed  CASE DISCUSSED IN MULTIDISCIPLINARY ROUNDS WITH ICU TEAM     Critical Care Time devoted to patient care services described in this note is 55  minutes.  Critical care was necessary to treat /prevent imminent and life-threatening deterioration. Overall, patient is critically ill, prognosis is guarded.  Patient with Multiorgan failure and at high risk for cardiac arrest and death.    Corrin Parker, M.D.  [facility]  Medical Director [place] Director [facility] Cardio-Pulmonary Department

## 2021-06-22 NOTE — Progress Notes (Signed)
Pt extubated to comfort care.

## 2021-06-22 NOTE — Progress Notes (Signed)
GOALS OF CARE DISCUSSION  The Clinical status was relayed to family in detail. Husband and SOn  Updated and notified of patients medical condition.    Patient remains unresponsive and will not open eyes to command.   Patient is having a weak cough and struggling to remove secretions.   Patient with increased WOB and using accessory muscles to breathe Explained to family course of therapy and the modalities    Patient with Progressive multiorgan failure with a very high probablity of a very minimal chance of meaningful recovery despite all aggressive and optimal medical therapy.    Family understands the situation.  They have consented and agreed to DNR/DNI and would like to proceed with Comfort care measures.  Family are satisfied with Plan of action and management. All questions answered  Additional CC time 35 mins   Kaari Zeigler Patricia Pesa, M.D.  [facility]  Medical Director [place] Director [facility] Cardio-Pulmonary Department

## 2021-06-23 LAB — C-REACTIVE PROTEIN: CRP: 4.4 mg/dL — ABNORMAL HIGH (ref <1.0)

## 2021-07-04 NOTE — Death Summary Note (Signed)
DEATH SUMMARY   Patient Details  Name: Paula Torres MRN: [id number] DOB: Aug 23, 1951  Admission/Discharge Information   Admit Date:  2021/06/30  Date of Death: Date of Death: 07/10/21  Time of Death: Time of Death: 2126-10-26  Length of Stay: 11/01/2022  Referring Physician: Rusty Aus, MD   Reason(s) for Hospitalization  Acute hypoxic hypercapnic respiratory failure secondary to HCAP, COPD and COVID pneumonia  Diagnoses  Preliminary cause of death:  Secondary Diagnoses (including complications and co-morbidities):  Principal Problem:   HCAP (healthcare-associated pneumonia) Active Problems:   COPD with acute exacerbation ([place])   Obesity, Class III, BMI 40-49.9 (morbid obesity) ([place])   Pulmonary hypertension ([place])   Acute on chronic respiratory failure with hypoxia and hypercapnia (H[facility])   Wound infection_left lower leg   Chronic diastolic CHF (congestive heart failure) (H[facility])   Iron deficiency anemia   PAF (paroxysmal atrial fibrillation) (H[facility])   OSA (obstructive sleep apnea)   Sepsis (H[facility])   CAD (coronary artery disease)   Pressure injury of skin   Brief Hospital Course (including significant findings, care, treatment, and services provided and events leading to death)  Paula Torres is a 70 y.o. year old female with significant PMH of HFpEF, Chronic respiratory failure with hypoxia ([place]), COPD (chronic obstructive pulmonary disease) ([place]), Coronary artery disease, non-occlusive, Hypertension, OSA (obstructive sleep apnea), PAF (paroxysmal atrial fibrillation) ([place]), and Pulmonary hypertension who presented to the ED on 1010/2022 with altered mental status and shortness of breath.  ED Course: Patient initially had oxygen desaturated to 88% on home level 3 L oxygen, BiPAP was started in ED.  First ABG with pH 7.24, CO2 88, O2 32.  The repeated VBG after started BiPAP showed pH 7.34, CO2 79, O2 36.  Initial labs revealed: WBC 18.2, BNP 515.4, negative COVID PCR, lactic acid 1.0, INR  1.0, electrolytes renal function okay, temperature 97.4, softer blood pressure 99/69, heart rate 93, RR 23. The repeated chest x-ray showed right lower lobe infiltration.  CT of head is negative for acute intracranial abnormalities.  Patient was admitted to progressive bed as inpatient with acute hypoxic hypercapnic respiratory failure secondary to HCAP and COPD with acute exacerbation.  Hospital Course: During the course of her hospitalization,  patient continued to have worsening respiratory status on BiPAP.  Pulmonary was consulted for further evaluation and management of complex, morbid pulmonary conditions.  On 06/19/2021 patient's tested positive for COVID-19 despite testing negative on admission.  She remained altered and hypoxic on BiPAP.  On 06/20/2021, patient was noted with progressive worsening of hypoxia and severe respiratory failure.  Rapid response was called and patient transferred to the ICU for closer monitoring.  On arrival to the ICU patient was noted to be lethargic and unable to tolerate BiPAP due to severe hypoxia and hypercapnia therefore was intubated for airway protection.  Palliative was consulted to establish goals of care due to worsening symptoms and poor prognosis.  Family consented to DO NOT RESUSCITATE and was eventually transitioned to comfort care.  Patient was terminally extubated to comfort care on 07/10/2021 at 3:34 PM and passed away peacefully on 2021/07/10 at 70 1:28 PM.  Next of kin notified.  Pertinent Labs and Studies  Significant Diagnostic Studies DG Abd 1 View  Result Date: 06/20/2021 CLINICAL DATA:  Enteric tube placement. EXAM: ABDOMEN - 1 VIEW COMPARISON:  Abdominal x-ray dated November 08, 2019. FINDINGS: Enteric tube tip in the distal gastric body. The bowel gas pattern is normal. No radio-opaque calculi or other significant radiographic  abnormality are seen. IMPRESSION: 1. Enteric tube tip in the distal gastric body. Electronically Signed   By: Titus Dubin M.D.   On: 06/20/2021 10:45   CT HEAD WO CONTRAST (5MM)  Result Date: 06/29/2021 CLINICAL DATA:  Mental status change, unknown cause. EXAM: CT HEAD WITHOUT CONTRAST TECHNIQUE: Contiguous axial images were obtained from the base of the skull through the vertex without intravenous contrast. COMPARISON:  Prior head CT examinations 05/28/2021 and earlier. FINDINGS: Brain: Mild generalized cerebral atrophy. Mild to moderate patchy and ill-defined hypoattenuation within the cerebral white matter, nonspecific but compatible with chronic small vessel ischemic disease. There is no acute intracranial hemorrhage. No demarcated cortical infarct. No extra-axial fluid collection. No evidence of an intracranial mass. No midline shift. Partially empty sella turcica. Vascular: No hyperdense vessel. Atherosclerotic calcifications. Skull: Normal. Negative for fracture or focal lesion. Sinuses/Orbits: Visualized orbits show no acute finding. Trace bilateral ethmoid sinus mucosal thickening. Small mucous retention cyst within a right ethmoid air cell. IMPRESSION: No evidence of acute intracranial abnormality. Mild-to-moderate chronic small vessel ischemic changes within the cerebral white matter. Mild generalized cerebral atrophy. Mild ethmoid sinus disease, as described. Electronically Signed   By: Kellie Simmering D.O.   On: 06/21/2021 16:01   CT HEAD WO CONTRAST (5MM)  Result Date: 05/28/2021 CLINICAL DATA:  Delirium. EXAM: CT HEAD WITHOUT CONTRAST TECHNIQUE: Contiguous axial images were obtained from the base of the skull through the vertex without intravenous contrast. COMPARISON:  CT head 04/25/2021 BRAIN: BRAIN Patchy and confluent areas of decreased attenuation are noted throughout the deep and periventricular white matter of the cerebral hemispheres bilaterally, compatible with chronic microvascular ischemic disease. No evidence of large-territorial acute infarction. No parenchymal hemorrhage. No mass lesion. No  extra-axial collection. No mass effect or midline shift. No hydrocephalus. Basilar cisterns are patent. Vascular: No hyperdense vessel. Atherosclerotic calcifications are present within the cavernous internal carotid arteries. Skull: No acute fracture or focal lesion. Sinuses/Orbits: Paranasal sinuses and mastoid air cells are clear. The orbits are unremarkable. Other: None. IMPRESSION: No acute intracranial abnormality. Electronically Signed   By: Iven Finn M.D.   On: 05/28/2021 18:00   US Venous Img Lower Unilateral Left (DVT)  Result Date: 05/28/2021 CLINICAL DATA:  Recent fall, cellulitis, pain and swelling EXAM: LEFT LOWER EXTREMITY VENOUS DOPPLER ULTRASOUND TECHNIQUE: Gray-scale sonography with compression, as well as color and duplex ultrasound, were performed to evaluate the deep venous system(s) from the level of the common femoral vein through the popliteal and proximal calf veins. COMPARISON:  None. FINDINGS: VENOUS Normal compressibility of the common femoral, superficial femoral, and popliteal veins, as well as the visualized calf veins. Visualized portions of profunda femoral vein and great saphenous vein unremarkable. No filling defects to suggest DVT on grayscale or color Doppler imaging. Doppler waveforms show normal direction of venous flow, normal respiratory plasticity and response to augmentation. Limited views of the contralateral common femoral vein are unremarkable. OTHER None. Limitations: none IMPRESSION: Negative examination for deep venous thrombosis in the left lower extremity. Electronically Signed   By: Eddie Candle M.D.   On: 05/28/2021 14:37   DG Chest Port 1 View  Result Date: 06/21/2021 CLINICAL DATA:  Acute respiratory failure and hypoxia EXAM: PORTABLE CHEST 1 VIEW COMPARISON:  Film from the previous day. FINDINGS: Endotracheal tube is again identified and stable. Esophageal probe is seen. Right jugular central line is noted. No pneumothorax is seen. Cardiac shadow  is enlarged but stable. Stable vascular congestion is again identified. Mild right-sided  pleural effusion is seen as well. IMPRESSION: New right-sided pleural effusion. Tubes and lines are stable. Stable vascular congestion. Electronically Signed   By: Inez Catalina M.D.   On: 06/21/2021 04:01   DG Chest Port 1 View  Result Date: 06/20/2021 CLINICAL DATA:  Intubation EXAM: PORTABLE CHEST 1 VIEW COMPARISON:  June 17, 2021 FINDINGS: The cardiomediastinal silhouette is unchanged in contour.Atherosclerotic calcifications. ETT tip terminates 2.9 cm above the carina. RIGHT neck approach CVC tip terminates over the region of the proximal SVC to the brachiocephalic vein; recommend correlation with function. Enteric tube tip terminates over the proximal stomach. Esophageal thermostat. No pleural effusion. No pneumothorax. Perihilar vascular congestion with peribronchial cuffing and streaky bibasilar opacities. Visualized abdomen is unremarkable. Multilevel degenerative changes of the thoracic spine. IMPRESSION: 1.  Support apparatus as described above. Electronically Signed   By: Valentino Saxon M.D.   On: 06/20/2021 10:45   DG Chest Port 1 View  Result Date: 06/17/2021 CLINICAL DATA:  Atelectasis EXAM: PORTABLE CHEST 1 VIEW COMPARISON:  06/30/2021 FINDINGS: Lung apices are partially obscured. Chronic interstitial changes. Improved aeration at the right lung base. No significant pleural effusion. Stable cardiomegaly. IMPRESSION: Improved aeration at the right lung base.  Otherwise unchanged. Electronically Signed   By: Macy Mis M.D.   On: 06/17/2021 13:18   DG Chest Port 1 View  Result Date: 06/04/2021 CLINICAL DATA:  Questionable sepsis - evaluate for abnormality new consolidation in the right lower EXAM: PORTABLE CHEST 1 VIEW COMPARISON:  05/28/2021. FINDINGS: New consolidation in the right lower lobe. Chronic interstitial prominence, slightly increased in conspicuity on the right. No visible  pleural effusions or pneumothorax. Limited evaluation left lung apex to overlapping head/neck. Similar enlargement the cardiac silhouette. Calcific atherosclerosis aorta. Chronic right rib fractures. Chronic left proximal humerus fracture. IMPRESSION: 1. New consolidation in the right lower lobe, concerning for pneumonia. 2. Chronic interstitial prominence, slightly increased in conspicuity on the right. This could represent chronic change, but infection or mild asymmetric edema are considerations. Electronically Signed   By: Margaretha Sheffield M.D.   On: 06/11/2021 12:40   DG Chest Port 1 View  Result Date: 05/28/2021 CLINICAL DATA:  Multiple falls, generalized weakness for about a week. Bilateral lower extremity edema. Is on oxygen at home. Hx of CHF, COPD. EXAM: PORTABLE CHEST 1 VIEW COMPARISON:  04/26/2021 FINDINGS: Mild enlargement of the cardiac silhouette, stable. No mediastinal or hilar masses. Prominent vascular markings similar to the prior study. Linear opacity at the left lung base consistent with atelectasis or scarring. No lung consolidation. No convincing edema. No visualized pleural effusion and no evidence of a pneumothorax. 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 IMPRESSION: 1. No acute cardiopulmonary disease. Electronically Signed   By: Lajean Manes M.D.   On: 05/28/2021 12:22   DG Knee Complete 4 Views Left  Result Date: 05/25/2021 CLINICAL DATA:  Fall.  Pain EXAM: LEFT KNEE - COMPLETE 4+ VIEW COMPARISON:  None. FINDINGS: There is diffuse soft tissue edema involving the anterior and lateral soft tissues. No underlying fracture or dislocation. Tiny joint effusion within the suprapatellar joint space. Patellofemoral and medial compartment narrowing. IMPRESSION: 1. No acute bone abnormality. 2. Soft tissue edema within the anterior and lateral soft tissues which may reflect contusion. 3. Tiny joint effusion. Electronically Signed   By: Kerby Moors M.D.   On:  05/25/2021 06:01    Microbiology Recent Results (from the past 240 hour(s))  Culture, blood (Routine X 2) w Reflex to ID  Panel     Status: None   Collection Time: 06/13/21  6:50 AM   Specimen: BLOOD  Result Value Ref Range Status   Specimen Description BLOOD RIGHT HAND  Final   Special Requests   Final    BOTTLES DRAWN AEROBIC ONLY Blood Culture results may not be optimal due to an inadequate volume of blood received in culture bottles   Culture   Final    NO GROWTH 5 DAYS Performed at [facility], [address] [place], [place] [address]    Report Status 06/18/2021 FINAL  Final  MRSA Next Gen by PCR, Nasal     Status: None   Collection Time: 06/14/21  9:00 AM   Specimen: Nasal Mucosa; Nasal Swab  Result Value Ref Range Status   MRSA by PCR Next Gen NOT DETECTED NOT DETECTED Final    Comment: (NOTE) The GeneXpert MRSA Assay (FDA approved for NASAL specimens only), is one component of a comprehensive MRSA colonization surveillance program. It is not intended to diagnose MRSA infection nor to guide or monitor treatment for MRSA infections. Test performance is not FDA approved in patients less than 52 years old. Performed at [facility], [address] [place], [place] [address]   Resp Panel by RT-PCR (Flu A&B, Covid) Nasopharyngeal Swab     Status: Abnormal   Collection Time: 06/19/21 11:00 AM   Specimen: Nasopharyngeal Swab; Nasopharyngeal(NP) swabs in vial transport medium  Result Value Ref Range Status   SARS Coronavirus 2 by RT PCR POSITIVE (A) NEGATIVE Final    Comment: RESULT CALLED TO, READ BACK BY AND VERIFIED WITH: C/JAMIE EANES 06/19/21 1226 AMK (NOTE) SARS-CoV-2 target nucleic acids are DETECTED.  The SARS-CoV-2 RNA is generally detectable in upper respiratory specimens during the acute phase of infection. Positive results are indicative of the presence of the identified virus, but do not rule out bacterial infection or co-infection with  other pathogens not detected by the test. Clinical correlation with patient history and other diagnostic information is necessary to determine patient infection status. The expected result is Negative.  Fact Sheet for Patients: EntrepreneurPulse.com.au  Fact Sheet for Healthcare Providers: IncredibleEmployment.be  This test is not yet approved or cleared by the [place] FDA and  has been authorized for detection and/or diagnosis of SARS-CoV-2 by FDA under an Emergency Use Authorization (EUA).  This EUA will remain in effect (meaning this test can be u sed) for the duration of  the COVID-19 declaration under Section 564(b)(1) of the Act, 21 U.S.C. section 360bbb-3(b)(1), unless the authorization is terminated or revoked sooner.     Influenza A by PCR NEGATIVE NEGATIVE Final   Influenza B by PCR NEGATIVE NEGATIVE Final    Comment: (NOTE) The Xpert Xpress SARS-CoV-2/FLU/RSV plus assay is intended as an aid in the diagnosis of influenza from Nasopharyngeal swab specimens and should not be used as a sole basis for treatment. Nasal washings and aspirates are unacceptable for Xpert Xpress SARS-CoV-2/FLU/RSV testing.  Fact Sheet for Patients: EntrepreneurPulse.com.au  Fact Sheet for Healthcare Providers: IncredibleEmployment.be  This test is not yet approved or cleared by the [place] FDA and has been authorized for detection and/or diagnosis of SARS-CoV-2 by FDA under an Emergency Use Authorization (EUA). This EUA will remain in effect (meaning this test can be used) for the duration of the COVID-19 declaration under Section 564(b)(1) of the Act, 21 U.S.C. section 360bbb-3(b)(1), unless the authorization is terminated or revoked.  Performed at [facility], [address] [place], [place] [address]     Lab Basic Metabolic Panel: Recent Labs  Lab 06/17/21 3817 06/18/21 0532  06/19/21 0518 06/19/21 0750 06/21/21 0500 06/22/21 0613  NA 140 137  --  137 138 136  K 4.7 4.9 4.6 4.7 5.1 4.8  CL 98 97*  --  95* 96* 95*  CO2 36* 35*  --  34* 37* 35*  GLUCOSE 133* 157*  --  117* 121* 147*  BUN 34* 33*  --  33* 39* 45*  CREATININE 0.84 0.76  --  0.78 0.71 0.56  CALCIUM 8.9 8.7*  --  8.9 8.8* 8.8*  MG  --   --   --   --   --  2.2  PHOS  --   --   --   --   --  3.3   Liver Function Tests: Recent Labs  Lab 06/21/21 0500 06/22/21 0613  AST 12* 11*  ALT 32 25  ALKPHOS 75 73  BILITOT 0.7 0.7  PROT 5.8* 6.3*  ALBUMIN 3.2* 3.4*   No results for input(s): LIPASE, AMYLASE in the last 168 hours. No results for input(s): AMMONIA in the last 168 hours. CBC: Recent Labs  Lab 06/18/21 0532 06/19/21 0518 06/20/21 0544 06/21/21 0500 06/22/21 1000  WBC 10.0 12.1* 11.3* 14.3* 15.8*  NEUTROABS 8.6* 10.0* 9.4* 12.4* 13.7*  HGB 8.9* 9.2* 9.7* 10.5* 9.6*  HCT 30.8* 30.3* 32.8* 34.7* 31.1*  MCV 89.5 88.6 91.9 89.7 89.1  PLT 243 252 274 342 273   Cardiac Enzymes: No results for input(s): CKTOTAL, CKMB, CKMBINDEX, TROPONINI in the last 168 hours. Sepsis Labs: Recent Labs  Lab 06/19/21 0518 06/20/21 0544 06/20/21 1027 06/21/21 0500 06/22/21 1000  PROCALCITON  --   --  <0.10  --   --   WBC 12.1* 11.3*  --  14.3* 15.8*    Procedures/Operations   10/18: Central venous catheter 10/18: Intubation      Rufina Falco, DNP, CCRN, FNP-C, AGACNP-BC Acute Care Nurse Practitioner  [facility] Pager: [phone] [place] at [facility]

## 2021-07-04 DEATH — deceased

## 2021-08-07 LAB — BLOOD GAS, ARTERIAL
Acid-Base Excess: 17.8 mmol/L — ABNORMAL HIGH (ref 0.0–2.0)
Bicarbonate: 44.1 mmol/L — ABNORMAL HIGH (ref 20.0–28.0)
FIO2: 60
MECHVT: 500 mL
O2 Saturation: 99.8 %
PEEP: 10 cmH2O
Patient temperature: 37
RATE: 14 resp/min
pCO2 arterial: 62 mmHg — ABNORMAL HIGH (ref 32.0–48.0)
pH, Arterial: 7.46 — ABNORMAL HIGH (ref 7.350–7.450)
pO2, Arterial: 227 mmHg — ABNORMAL HIGH (ref 83.0–108.0)

## 2021-11-01 ENCOUNTER — Ambulatory Visit: Payer: Medicare Other | Admitting: Family
